# Patient Record
Sex: Female | Born: 1940
Health system: Southern US, Community
[De-identification: ages and names within clinical notes are randomized; demographics above are authoritative.]

## PROBLEM LIST (undated history)

## (undated) DIAGNOSIS — G709 Myoneural disorder, unspecified: Secondary | ICD-10-CM

## (undated) DIAGNOSIS — E785 Hyperlipidemia, unspecified: Secondary | ICD-10-CM

## (undated) DIAGNOSIS — I499 Cardiac arrhythmia, unspecified: Secondary | ICD-10-CM

## (undated) DIAGNOSIS — I4891 Unspecified atrial fibrillation: Secondary | ICD-10-CM

## (undated) DIAGNOSIS — I10 Essential (primary) hypertension: Secondary | ICD-10-CM

## (undated) DIAGNOSIS — C801 Malignant (primary) neoplasm, unspecified: Secondary | ICD-10-CM

## (undated) HISTORY — DX: Hyperlipidemia, unspecified: E78.5

## (undated) HISTORY — DX: Malignant (primary) neoplasm, unspecified: C80.1

## (undated) HISTORY — PX: TONSILLECTOMY: SUR1361

## (undated) HISTORY — PX: EYE SURGERY: SHX253

---

## 1998-09-22 ENCOUNTER — Other Ambulatory Visit: Admission: RE | Admit: 1998-09-22 | Discharge: 1998-09-22 | Payer: Self-pay | Admitting: *Deleted

## 1998-11-14 ENCOUNTER — Ambulatory Visit (HOSPITAL_COMMUNITY): Admission: RE | Admit: 1998-11-14 | Discharge: 1998-11-14 | Payer: Self-pay | Admitting: Gastroenterology

## 1998-11-14 ENCOUNTER — Encounter: Payer: Self-pay | Admitting: Gastroenterology

## 1998-12-20 HISTORY — PX: COLONOSCOPY: SHX174

## 1999-09-12 ENCOUNTER — Encounter: Admission: RE | Admit: 1999-09-12 | Discharge: 1999-09-12 | Payer: Self-pay | Admitting: Obstetrics and Gynecology

## 1999-09-12 ENCOUNTER — Encounter: Payer: Self-pay | Admitting: Obstetrics and Gynecology

## 1999-09-12 ENCOUNTER — Other Ambulatory Visit: Admission: RE | Admit: 1999-09-12 | Discharge: 1999-09-12 | Payer: Self-pay | Admitting: Obstetrics and Gynecology

## 2000-09-13 ENCOUNTER — Other Ambulatory Visit: Admission: RE | Admit: 2000-09-13 | Discharge: 2000-09-13 | Payer: Self-pay | Admitting: Obstetrics and Gynecology

## 2000-09-25 ENCOUNTER — Encounter: Admission: RE | Admit: 2000-09-25 | Discharge: 2000-09-25 | Payer: Self-pay | Admitting: Obstetrics and Gynecology

## 2000-09-25 ENCOUNTER — Encounter: Payer: Self-pay | Admitting: Obstetrics and Gynecology

## 2001-09-16 ENCOUNTER — Other Ambulatory Visit: Admission: RE | Admit: 2001-09-16 | Discharge: 2001-09-16 | Payer: Self-pay | Admitting: Obstetrics and Gynecology

## 2001-10-01 ENCOUNTER — Encounter: Admission: RE | Admit: 2001-10-01 | Discharge: 2001-10-01 | Payer: Self-pay | Admitting: Obstetrics and Gynecology

## 2001-10-01 ENCOUNTER — Encounter: Payer: Self-pay | Admitting: Obstetrics and Gynecology

## 2002-09-18 ENCOUNTER — Other Ambulatory Visit: Admission: RE | Admit: 2002-09-18 | Discharge: 2002-09-18 | Payer: Self-pay | Admitting: Obstetrics and Gynecology

## 2002-10-02 ENCOUNTER — Encounter: Admission: RE | Admit: 2002-10-02 | Discharge: 2002-10-02 | Payer: Self-pay | Admitting: Obstetrics and Gynecology

## 2002-10-02 ENCOUNTER — Encounter: Payer: Self-pay | Admitting: Obstetrics and Gynecology

## 2003-09-21 ENCOUNTER — Other Ambulatory Visit: Admission: RE | Admit: 2003-09-21 | Discharge: 2003-09-21 | Payer: Self-pay | Admitting: Obstetrics and Gynecology

## 2003-10-05 ENCOUNTER — Encounter: Admission: RE | Admit: 2003-10-05 | Discharge: 2003-10-05 | Payer: Self-pay | Admitting: Obstetrics and Gynecology

## 2004-10-05 ENCOUNTER — Encounter: Admission: RE | Admit: 2004-10-05 | Discharge: 2004-10-05 | Payer: Self-pay | Admitting: Obstetrics and Gynecology

## 2005-10-03 ENCOUNTER — Other Ambulatory Visit: Admission: RE | Admit: 2005-10-03 | Discharge: 2005-10-03 | Payer: Self-pay | Admitting: Obstetrics and Gynecology

## 2005-10-08 ENCOUNTER — Encounter: Admission: RE | Admit: 2005-10-08 | Discharge: 2005-10-08 | Payer: Self-pay | Admitting: Obstetrics and Gynecology

## 2006-10-09 ENCOUNTER — Encounter: Admission: RE | Admit: 2006-10-09 | Discharge: 2006-10-09 | Payer: Self-pay | Admitting: Obstetrics and Gynecology

## 2006-10-22 ENCOUNTER — Encounter: Admission: RE | Admit: 2006-10-22 | Discharge: 2006-10-22 | Payer: Self-pay | Admitting: Obstetrics and Gynecology

## 2007-10-14 ENCOUNTER — Other Ambulatory Visit: Admission: RE | Admit: 2007-10-14 | Discharge: 2007-10-14 | Payer: Self-pay | Admitting: Obstetrics and Gynecology

## 2007-10-24 ENCOUNTER — Encounter: Admission: RE | Admit: 2007-10-24 | Discharge: 2007-10-24 | Payer: Self-pay | Admitting: Obstetrics and Gynecology

## 2008-10-26 ENCOUNTER — Other Ambulatory Visit: Admission: RE | Admit: 2008-10-26 | Discharge: 2008-10-26 | Payer: Self-pay | Admitting: Obstetrics and Gynecology

## 2008-10-26 ENCOUNTER — Encounter: Admission: RE | Admit: 2008-10-26 | Discharge: 2008-10-26 | Payer: Self-pay | Admitting: Obstetrics and Gynecology

## 2009-10-31 ENCOUNTER — Encounter: Admission: RE | Admit: 2009-10-31 | Discharge: 2009-10-31 | Payer: Self-pay | Admitting: Obstetrics and Gynecology

## 2010-03-13 ENCOUNTER — Encounter (INDEPENDENT_AMBULATORY_CARE_PROVIDER_SITE_OTHER): Payer: Self-pay | Admitting: *Deleted

## 2010-04-18 ENCOUNTER — Encounter (INDEPENDENT_AMBULATORY_CARE_PROVIDER_SITE_OTHER): Payer: Self-pay | Admitting: *Deleted

## 2010-04-21 ENCOUNTER — Ambulatory Visit: Payer: Self-pay | Admitting: Gastroenterology

## 2010-04-26 ENCOUNTER — Ambulatory Visit: Payer: Self-pay | Admitting: Gastroenterology

## 2010-11-07 ENCOUNTER — Encounter
Admission: RE | Admit: 2010-11-07 | Discharge: 2010-11-07 | Payer: Self-pay | Source: Home / Self Care | Attending: Family Medicine | Admitting: Family Medicine

## 2010-12-10 ENCOUNTER — Encounter: Payer: Self-pay | Admitting: Obstetrics and Gynecology

## 2010-12-19 NOTE — Miscellaneous (Signed)
Summary: previsit/recall/rm  Clinical Lists Changes  Medications: Added new medication of MOVIPREP 100 GM  SOLR (PEG-KCL-NACL-NASULF-NA ASC-C) As per prep instructions. - Signed Rx of MOVIPREP 100 GM  SOLR (PEG-KCL-NACL-NASULF-NA ASC-C) As per prep instructions.;  #1 x 0;  Signed;  Entered by: Sherren Kerns RN;  Authorized by: Louis Meckel MD;  Method used: Electronically to [facility]. #[id number]*, [address], [place], [place], [place]  [address], Ph: [phone], Fax: [phone] Allergies: Added new allergy or adverse reaction of CODEINE Added new allergy or adverse reaction of * BANDAID Observations: Added new observation of NKA: F (04/21/2010 8:20)    Prescriptions: MOVIPREP 100 GM  SOLR (PEG-KCL-NACL-NASULF-NA ASC-C) As per prep instructions.  #1 x 0   Entered by:   Sherren Kerns RN   Authorized by:   Louis Meckel MD   Signed by:   Sherren Kerns RN on 04/21/2010   Method used:   Electronically to        [facility]. [phone]* (retail)       [address]       [place], [place]  [address]       Ph: [phone]       Fax: [phone]   RxID:   [id number]

## 2010-12-19 NOTE — Letter (Signed)
Summary: [facility] Instructions  [facility]  [address] [place], [place] [address]   Phone: [phone]  Fax: [phone]       BEAULAH ROMANEK    02/07/1941    MRN: [id number]        Procedure Day Dorna Bloom:  Wednesday 04/26/2010     Arrival Time: 7:30 am      Procedure Time: 8:00 am     Location of Procedure:                    _x _  [facility] (4th Floor)                        PREPARATION FOR COLONOSCOPY WITH MOVIPREP   Starting 5 days prior to your procedure Friday 6/3 do not eat nuts, seeds, popcorn, corn, beans, peas,  salads, or any raw vegetables.  Do not take any fiber supplements (e.g. Metamucil, Citrucel, and Benefiber).  THE DAY BEFORE YOUR PROCEDURE         DATE: Tuesday 6/7  1.  Drink clear liquids the entire day-NO SOLID FOOD  2.  Do not drink anything colored red or purple.  Avoid juices with pulp.  No orange juice.  3.  Drink at least 64 oz. (8 glasses) of fluid/clear liquids during the day to prevent dehydration and help the prep work efficiently.  CLEAR LIQUIDS INCLUDE: Water Jello Ice Popsicles Tea (sugar ok, no milk/cream) Powdered fruit flavored drinks Coffee (sugar ok, no milk/cream) Gatorade Juice: apple, white grape, white cranberry  Lemonade Clear bullion, consomm, broth Carbonated beverages (any kind) Strained chicken noodle soup Hard Candy                             4.  In the morning, mix first dose of MoviPrep solution:    Empty 1 Pouch A and 1 Pouch B into the disposable container    Add lukewarm drinking water to the top line of the container. Mix to dissolve    Refrigerate (mixed solution should be used within 24 hrs)  5.  Begin drinking the prep at 5:00 p.m. The MoviPrep container is divided by 4 marks.   Every 15 minutes drink the solution down to the next mark (approximately 8 oz) until the full liter is complete.   6.  Follow completed prep with 16 oz of clear liquid of your choice  (Nothing red or purple).  Continue to drink clear liquids until bedtime.  7.  Before going to bed, mix second dose of MoviPrep solution:    Empty 1 Pouch A and 1 Pouch B into the disposable container    Add lukewarm drinking water to the top line of the container. Mix to dissolve    Refrigerate  THE DAY OF YOUR PROCEDURE      DATE: Wednesday 6/8  Beginning at 3:00 a.m. (5 hours before procedure):         1. Every 15 minutes, drink the solution down to the next mark (approx 8 oz) until the full liter is complete.  2. Follow completed prep with 16 oz. of clear liquid of your choice.    3. You may drink clear liquids until 6:00 am (2 HOURS BEFORE PROCEDURE).   MEDICATION INSTRUCTIONS  Unless otherwise instructed, you should take regular prescription medications with a small sip of water   as early as possible the morning of  your procedure.   Additional medication instructions:  n/a         OTHER INSTRUCTIONS  You will need a responsible adult at least 69 years of age to accompany you and drive you home.   This person must remain in the waiting room during your procedure.  Wear loose fitting clothing that is easily removed.  Leave jewelry and other valuables at home.  However, you may wish to bring a book to read or  an iPod/MP3 player to listen to music as you wait for your procedure to start.  Remove all body piercing jewelry and leave at home.  Total time from sign-in until discharge is approximately 2-3 hours.  You should go home directly after your procedure and rest.  You can resume normal activities the  day after your procedure.  The day of your procedure you should not:   Drive   Make legal decisions   Operate machinery   Drink alcohol   Return to work  You will receive specific instructions about eating, activities and medications before you leave.    The above instructions have been reviewed and explained to me by   Sherren Kerns RN  April 21, 2010 8:48 AM     I fully understand and can verbalize these instructions _____________________________ Date _________

## 2010-12-19 NOTE — Letter (Signed)
Summary: Previsit letter  [facility]  [address] [place], [place] [address]   Phone: [phone]  Fax: [phone]       03/13/2010 MRN: [id number]  Kathy Robinson [address] [place], [place]  [address]  Dear Kathy Robinson,  Welcome to the Gastroenterology Division at [facility].    You are scheduled to see a nurse for your pre-procedure visit on 04/21/2010 at 8:30am on the 3rd floor at [facility], [address]  We ask that you try to arrive at our office 15 minutes prior to your appointment time to allow for check-in.  Your nurse visit will consist of discussing your medical and surgical history, your immediate family medical history, and your medications.    Please bring a complete list of all your medications or, if you prefer, bring the medication bottles and we will list them.  We will need to be aware of both prescribed and over the counter drugs.  We will need to know exact dosage information as well.  If you are on blood thinners (Coumadin, Plavix, Aggrenox, Ticlid, etc.) please call our office today/prior to your appointment, as we need to consult with your physician about holding your medication.   Please be prepared to read and sign documents such as consent forms, a financial agreement, and acknowledgement forms.  If necessary, and with your consent, a friend or relative is welcome to sit-in on the nurse visit with you.  Please bring your insurance card so that we may make a copy of it.  If your insurance requires a referral to see a specialist, please bring your referral form from your primary care physician.  No co-pay is required for this nurse visit.     If you cannot keep your appointment, please call [phone] to cancel or reschedule prior to your appointment date.  This allows [place] the opportunity to schedule an appointment for another patient in need of care.    Thank you for choosing  Gastroenterology for your medical  needs.  We appreciate the opportunity to care for you.  Please visit [place] at our website  to learn more about our practice.                     Sincerely.                                                                                                                   The Gastroenterology Division

## 2010-12-19 NOTE — Procedures (Signed)
Summary: Colonoscopy  Patient: Glendia Olshefski Note: All result statuses are Final unless otherwise noted.  Tests: (1) Colonoscopy (COL)   COL Colonoscopy           DONE     [facility]     [address]     [place], [place]  [address]           COLONOSCOPY PROCEDURE REPORT           PATIENT:  Anadalay, Macdonell  MR#:  [id number]     BIRTHDATE:  18-Aug-1941, 68 yrs. old  GENDER:  female           ENDOSCOPIST:  Barbette Hair. Arlyce Dice, MD     Referred by:           PROCEDURE DATE:  04/26/2010     PROCEDURE:  Diagnostic Colonoscopy     ASA CLASS:  Class I     INDICATIONS:  1) Routine Risk Screening           MEDICATIONS:   Fentanyl 75 mcg IV, Versed 7 mg IV           DESCRIPTION OF PROCEDURE:   After the risks benefits and     alternatives of the procedure were thoroughly explained, informed     consent was obtained.  Digital rectal exam was performed and     revealed no abnormalities.   The LB CF-[id number] endoscope     was introduced through the anus and advanced to the cecum, which     was identified by the ileocecal valve, without limitations.  The     quality of the prep was excellent, using MoviPrep.  The instrument     was then slowly withdrawn as the colon was fully examined.     <<PROCEDUREIMAGES>>           FINDINGS:  Scattered diverticula were found ascending colon to     sigmoid colon (see image1).  This was otherwise a normal     examination of the colon (see image2, image3, image7, image10, and     image11).   Retroflexed views in the rectum revealed no     abnormalities.    The time to cecum =  3.0  minutes. The scope was     then withdrawn (time =  6.5  min) from the patient and the     procedure completed.           COMPLICATIONS:  None           ENDOSCOPIC IMPRESSION:     1) Diverticula, scattered ascending colon to sigmoid colon     2) Otherwise normal examination     RECOMMENDATIONS:     1) Continue current colorectal screening  recommendations for     "routine risk" patients with a repeat colonoscopy in 10 years.           REPEAT EXAM:  In 10 year(s) for Colonoscopy.           ______________________________     Barbette Hair. Arlyce Dice, MD           CC: Shaune Pollack, MD, Meredeth Ide, MD           n.     Rosalie Doctor:   Barbette Hair. Olamide Lahaie at 04/26/2010 08:43 AM           Revonda Standard, [id number]  Note: An exclamation mark (!) indicates a result that was not dispersed into the flowsheet. Document Creation  Date: 04/26/2010 8:44 AM _______________________________________________________________________  (1) Order result status: Final Collection or observation date-time: 04/26/2010 08:36 Requested date-time:  Receipt date-time:  Reported date-time:  Referring Physician:   Ordering Physician: Melvia Heaps [phone]) Specimen Source:  Source: Launa Grill Order Number: [id number] Lab site:   Appended Document: Colonoscopy    Clinical Lists Changes  Observations: Added new observation of COLONNXTDUE: 04/2020 (04/26/2010 11:17)

## 2011-07-19 ENCOUNTER — Other Ambulatory Visit: Payer: Self-pay | Admitting: Obstetrics and Gynecology

## 2011-07-19 DIAGNOSIS — Z1231 Encounter for screening mammogram for malignant neoplasm of breast: Secondary | ICD-10-CM

## 2011-11-21 ENCOUNTER — Ambulatory Visit
Admission: RE | Admit: 2011-11-21 | Discharge: 2011-11-21 | Disposition: A | Discharge status: Home / Self Care | Payer: Medicare Other | Source: Ambulatory Visit | Attending: Obstetrics and Gynecology | Admitting: Obstetrics and Gynecology

## 2011-11-21 DIAGNOSIS — Z1231 Encounter for screening mammogram for malignant neoplasm of breast: Secondary | ICD-10-CM

## 2012-12-22 ENCOUNTER — Other Ambulatory Visit: Payer: Self-pay | Admitting: Obstetrics and Gynecology

## 2012-12-22 DIAGNOSIS — Z1231 Encounter for screening mammogram for malignant neoplasm of breast: Secondary | ICD-10-CM

## 2013-01-20 ENCOUNTER — Ambulatory Visit
Admission: RE | Admit: 2013-01-20 | Discharge: 2013-01-20 | Disposition: A | Discharge status: Home / Self Care | Payer: Medicare Other | Source: Ambulatory Visit | Attending: Obstetrics and Gynecology | Admitting: Obstetrics and Gynecology

## 2013-01-20 ENCOUNTER — Ambulatory Visit: Payer: Medicare Other

## 2013-01-20 ENCOUNTER — Other Ambulatory Visit: Payer: Self-pay | Admitting: Obstetrics and Gynecology

## 2013-01-20 DIAGNOSIS — Z1231 Encounter for screening mammogram for malignant neoplasm of breast: Secondary | ICD-10-CM

## 2013-02-10 ENCOUNTER — Encounter: Payer: Self-pay | Admitting: Obstetrics and Gynecology

## 2013-02-10 ENCOUNTER — Encounter: Payer: Self-pay | Admitting: *Deleted

## 2013-02-11 ENCOUNTER — Encounter: Payer: Self-pay | Admitting: Obstetrics and Gynecology

## 2013-02-11 ENCOUNTER — Ambulatory Visit (INDEPENDENT_AMBULATORY_CARE_PROVIDER_SITE_OTHER): Payer: 59 | Admitting: Obstetrics and Gynecology

## 2013-02-11 ENCOUNTER — Encounter: Payer: Self-pay | Admitting: *Deleted

## 2013-02-11 VITALS — BP 134/70 | Wt 142.0 lb

## 2013-02-11 DIAGNOSIS — M8580 Other specified disorders of bone density and structure, unspecified site: Secondary | ICD-10-CM

## 2013-02-11 DIAGNOSIS — M899 Disorder of bone, unspecified: Secondary | ICD-10-CM

## 2013-02-11 MED ORDER — RISEDRONATE SODIUM 35 MG PO TABS: 35.0000 mg | ORAL_TABLET | ORAL | Status: DC

## 2013-02-11 NOTE — Progress Notes (Signed)
72 yo SWF  G0P0 here to discuss BMD results.  Had Dexa 3.13.2014 showing a t score of -2.3 at the spine.  This was significantly decreased since her exam in Jan 2010.  She quit HRT in Sept 2009, just before the 2010 Dexa.  Now since off HRT, her BMD has declined 9.3 %.   Results discussed with patient and comparison made to previous studies.  Rec either aggressive therapy without meds with weight lifting and carkio exercises vs bisphosphonates. Pt already walks almost every day and does 115 reps of lifting 5 pound weights every day. Pt's mother had bad experience with fosamax, so pt prefers a different med.  We will try weekly actonel,  Pt instructed.

## 2013-02-11 NOTE — Patient Instructions (Addendum)
Bone Densitometry Bone densitometry is a special X-ray that measures your bone density and can be used to help predict your risk of bone fractures. This test is used to determine bone mineral content and density to diagnose osteoporosis. Osteoporosis is the loss of bone that may cause the bone to become weak. Osteoporosis commonly occurs in women entering menopause. However, it may be found in men and in people with other diseases. WHO SHOULD BE TESTED?  All women older than 38.  Postmenopausal women (50 to 66) with risk factors for osteoporosis.  People with a previous fracture caused by normal activities.  People with a small body frame (less than 127 poundsor a body mass index [BMI] of less than 21).  People who have a parent with a hip fracture or history of osteoporosis.  People who smoke.  People who have rheumatoid arthritis.  Anyone who engages in excessive alcohol use (more than 3 drinks most days).  Women who experience early menopause. WHEN SHOULD YOU BE RETESTED? Current guidelines suggest that you should wait at least 2 years before doing a bone density test again if your first test was normal.Recent studies indicated that women with normal bone density may be able to wait a few years before needing to repeat a bone density test. You should discuss this with your caregiver.  NORMAL FINDINGS   Normal: less than standard deviation below normal (greater than -1).  Osteopenia: 1 to 2.5 standard deviations below normal (-1 to -2.5).  Osteoporosis: greater than 2.5 standard deviations below normal (less than -2.5). Test results are reported as a "T score" and a "Z score."The T score is a number that compares your bone density with the bone density of healthy, young women.The Z score is a number that compares your bone density with the scores of women who are the same age, gender, and race.  Ranges for normal findings may vary among different laboratories and hospitals. You  should always check with your doctor after having lab work or other tests done to discuss the meaning of your test results and whether your values are considered within normal limits. MEANING OF TEST  Your caregiver will go over the test results with you and discuss the importance and meaning of your results, as well as treatment options and the need for additional tests if necessary. OBTAINING THE TEST RESULTS It is your responsibility to obtain your test results. Ask the lab or department performing the test when and how you will get your results. Document Released: 11/27/2004 Document Revised: 01/28/2012 Document Reviewed: 12/20/2010 [facility] Patient Information 2013 [place], [place]. The name of the every six month shot is PROLIA

## 2013-02-12 ENCOUNTER — Telehealth: Payer: Self-pay | Admitting: Obstetrics and Gynecology

## 2013-02-12 NOTE — Telephone Encounter (Signed)
Pt called her insurance company to see if they would cover Prolia (injection). Insurance company will cover the injection. Insurance company gave her a number for Dr. Tresa Res to call in order to get it cleared with the insurance company. The phone number is [phone].

## 2013-02-16 NOTE — Telephone Encounter (Signed)
Prolia Plus form faxed to [employer]. Fax confirmed. sue

## 2013-02-18 ENCOUNTER — Telehealth: Payer: Self-pay | Admitting: Obstetrics and Gynecology

## 2013-02-18 NOTE — Telephone Encounter (Signed)
Pt called pharmacy and ins co will cover Prolia. Pt needs rx for Prolia. Please call rx to [phone]. Pt states she called last week and lm but no prior messages came up/[place]

## 2013-02-18 NOTE — Telephone Encounter (Signed)
02/18/2013 pt was notified that her prior approval for prolia has been started and still waiting for approval Will notify pt. When approval has been obtained. Kathy Robinson in triage will call to check on this cm.

## 2013-02-18 NOTE — Telephone Encounter (Signed)
Fine to RX prolia 60 mg subq to bring to office for injection.  Refill once.

## 2013-02-19 NOTE — Telephone Encounter (Signed)
Prior Authorization faxed to [facility] for Prolia

## 2013-02-20 ENCOUNTER — Other Ambulatory Visit: Payer: Self-pay | Admitting: *Deleted

## 2013-02-20 ENCOUNTER — Telehealth: Payer: Self-pay | Admitting: *Deleted

## 2013-02-20 DIAGNOSIS — M810 Age-related osteoporosis without current pathological fracture: Secondary | ICD-10-CM | POA: Insufficient documentation

## 2013-02-20 MED ORDER — DENOSUMAB 60 MG/ML ~~LOC~~ SOLN: 60.0000 mg | SUBCUTANEOUS | Status: DC

## 2013-02-20 NOTE — Telephone Encounter (Signed)
Yes, fine to order labs.

## 2013-02-20 NOTE — Telephone Encounter (Signed)
prolia approved by [facility] for one year 02/2014. Phoned in to [facility]...sue

## 2013-02-20 NOTE — Telephone Encounter (Signed)
Patient notified of need to come to office for lab work of calcium and vit d blood work. After labs drawn and resulted will call in Prolia to her pharmacy [facility]. Kathy Robinson.. orders put in for labs.

## 2013-02-20 NOTE — Telephone Encounter (Signed)
Approval for Prolia received. OK to order calcuim and vitamin D level prior to injection or check with PCP if one has been done already ?  Please advise? Fannie Knee   No recent labs noted in chart.

## 2013-02-20 NOTE — Telephone Encounter (Signed)
Left message on home # of need to call office to schedule lab test prior to Prolia Injections.sue

## 2013-02-23 ENCOUNTER — Other Ambulatory Visit (INDEPENDENT_AMBULATORY_CARE_PROVIDER_SITE_OTHER): Payer: 59

## 2013-02-23 DIAGNOSIS — M899 Disorder of bone, unspecified: Secondary | ICD-10-CM

## 2013-02-23 DIAGNOSIS — M810 Age-related osteoporosis without current pathological fracture: Secondary | ICD-10-CM

## 2013-02-23 DIAGNOSIS — M949 Disorder of cartilage, unspecified: Secondary | ICD-10-CM

## 2013-02-23 LAB — CALCIUM: Calcium: 9.6 mg/dL (ref 8.4–10.5)

## 2013-02-24 ENCOUNTER — Telehealth: Payer: Self-pay | Admitting: *Deleted

## 2013-02-24 LAB — VITAMIN D 25 HYDROXY (VIT D DEFICIENCY, FRACTURES): Vit D, 25-Hydroxy: 61 ng/mL (ref 30–89)

## 2013-02-24 NOTE — Progress Notes (Signed)
LMTCB 02/24/13 cm

## 2013-02-24 NOTE — Telephone Encounter (Signed)
Spoke with patient about her Vitamin D Level and instructions  given to take 2000iu daily, pt has decided not to do Prolia and to Pick up Rx For Actonel at Pharmacy. Prolia was previously approved per Fannie Knee in Triage please advise.

## 2013-02-24 NOTE — Telephone Encounter (Signed)
That's fine.  She has rx for actonel already that I sent to her pharmacy.

## 2013-02-24 NOTE — Telephone Encounter (Signed)
Spoke with pt. Advised her per our conversation that Dr. Tresa Res was ok with her taking Actonel

## 2013-02-25 ENCOUNTER — Telehealth: Payer: Self-pay | Admitting: *Deleted

## 2013-02-25 NOTE — Telephone Encounter (Signed)
Patient informed Dr. Tresa Res that she does not want to do Prolia injection at this time. All forms for approval of Prolia for patient sent to [facility] to be scanned into chart. sue

## 2013-12-08 ENCOUNTER — Other Ambulatory Visit: Payer: Self-pay

## 2013-12-08 DIAGNOSIS — Z1231 Encounter for screening mammogram for malignant neoplasm of breast: Secondary | ICD-10-CM

## 2014-01-08 ENCOUNTER — Ambulatory Visit: Payer: Self-pay | Admitting: Gynecology

## 2014-01-08 ENCOUNTER — Ambulatory Visit: Payer: Self-pay | Admitting: Obstetrics and Gynecology

## 2014-01-14 ENCOUNTER — Ambulatory Visit: Payer: Self-pay | Admitting: Obstetrics & Gynecology

## 2014-01-25 ENCOUNTER — Ambulatory Visit
Admission: RE | Admit: 2014-01-25 | Discharge: 2014-01-25 | Disposition: A | Discharge status: Home / Self Care | Payer: Medicare Other | Source: Ambulatory Visit

## 2014-01-25 DIAGNOSIS — Z1231 Encounter for screening mammogram for malignant neoplasm of breast: Secondary | ICD-10-CM

## 2014-12-28 ENCOUNTER — Other Ambulatory Visit: Payer: Self-pay

## 2014-12-28 DIAGNOSIS — Z1231 Encounter for screening mammogram for malignant neoplasm of breast: Secondary | ICD-10-CM

## 2015-02-01 ENCOUNTER — Ambulatory Visit
Admission: RE | Admit: 2015-02-01 | Discharge: 2015-02-01 | Disposition: A | Discharge status: Home / Self Care | Payer: Medicare Other | Source: Ambulatory Visit

## 2015-02-01 DIAGNOSIS — Z1231 Encounter for screening mammogram for malignant neoplasm of breast: Secondary | ICD-10-CM

## 2016-01-09 ENCOUNTER — Other Ambulatory Visit: Payer: Self-pay | Admitting: Family Medicine

## 2016-01-09 DIAGNOSIS — Z1231 Encounter for screening mammogram for malignant neoplasm of breast: Secondary | ICD-10-CM

## 2016-03-07 ENCOUNTER — Ambulatory Visit
Admission: RE | Admit: 2016-03-07 | Discharge: 2016-03-07 | Disposition: A | Discharge status: Home / Self Care | Payer: Medicare Other | Source: Ambulatory Visit | Attending: Family Medicine | Admitting: Family Medicine

## 2016-03-07 DIAGNOSIS — Z1231 Encounter for screening mammogram for malignant neoplasm of breast: Secondary | ICD-10-CM

## 2017-01-16 ENCOUNTER — Other Ambulatory Visit: Payer: Self-pay | Admitting: Family Medicine

## 2017-01-16 DIAGNOSIS — Z1231 Encounter for screening mammogram for malignant neoplasm of breast: Secondary | ICD-10-CM

## 2017-03-08 ENCOUNTER — Ambulatory Visit
Admission: RE | Admit: 2017-03-08 | Discharge: 2017-03-08 | Disposition: A | Discharge status: Home / Self Care | Payer: Medicare Other | Source: Ambulatory Visit | Attending: Family Medicine | Admitting: Family Medicine

## 2017-03-08 DIAGNOSIS — Z1231 Encounter for screening mammogram for malignant neoplasm of breast: Secondary | ICD-10-CM

## 2017-12-24 ENCOUNTER — Other Ambulatory Visit: Payer: Self-pay | Admitting: Family Medicine

## 2017-12-24 DIAGNOSIS — Z139 Encounter for screening, unspecified: Secondary | ICD-10-CM

## 2018-03-10 ENCOUNTER — Ambulatory Visit
Admission: RE | Admit: 2018-03-10 | Discharge: 2018-03-10 | Disposition: A | Discharge status: Home / Self Care | Payer: Medicare Other | Source: Ambulatory Visit | Attending: Family Medicine | Admitting: Family Medicine

## 2018-03-10 DIAGNOSIS — Z139 Encounter for screening, unspecified: Secondary | ICD-10-CM

## 2019-01-20 ENCOUNTER — Other Ambulatory Visit: Payer: Self-pay | Admitting: Rheumatology

## 2019-01-20 DIAGNOSIS — Z1231 Encounter for screening mammogram for malignant neoplasm of breast: Secondary | ICD-10-CM

## 2019-03-12 ENCOUNTER — Ambulatory Visit: Payer: Medicare Other

## 2019-11-20 DIAGNOSIS — C189 Malignant neoplasm of colon, unspecified: Secondary | ICD-10-CM

## 2019-11-20 HISTORY — DX: Malignant neoplasm of colon, unspecified: C18.9

## 2019-12-10 ENCOUNTER — Ambulatory Visit: Payer: Medicare Other | Attending: Internal Medicine

## 2019-12-10 DIAGNOSIS — Z23 Encounter for immunization: Secondary | ICD-10-CM | POA: Insufficient documentation

## 2019-12-10 NOTE — Progress Notes (Signed)
   Covid-19 Vaccination Clinic  Name:  Kathy Robinson    MRN: [id number] DOB: 03/29/1941  12/10/2019  [name] was observed post Covid-19 immunization for 15 minutes without incidence. She was provided with Vaccine Information Sheet and instruction to access the V-Safe system.   [name] was instructed to call 911 with any severe reactions post vaccine: Marland Kitchen Difficulty breathing  . Swelling of your face and throat  . A fast heartbeat  . A bad rash all over your body  . Dizziness and weakness    Immunizations Administered    Name Date Dose VIS Date Route   Pfizer COVID-19 Vaccine 12/10/2019  8:31 AM 0.3 mL 10/30/2019 Intramuscular   Manufacturer: [place]   Lot: [id number]   [place]: [id number]

## 2019-12-30 ENCOUNTER — Ambulatory Visit: Payer: Medicare Other | Attending: Internal Medicine

## 2019-12-30 DIAGNOSIS — Z23 Encounter for immunization: Secondary | ICD-10-CM

## 2019-12-30 NOTE — Progress Notes (Signed)
   Covid-19 Vaccination Clinic  Name:  Kathy Robinson    MRN: [id number] DOB: Sep 08, 1941  12/30/2019  [name] was observed post Covid-19 immunization for 15 minutes without incidence. She was provided with Vaccine Information Sheet and instruction to access the V-Safe system.   [name] was instructed to call 911 with any severe reactions post vaccine: Marland Kitchen Difficulty breathing  . Swelling of your face and throat  . A fast heartbeat  . A bad rash all over your body  . Dizziness and weakness    Immunizations Administered    Name Date Dose VIS Date Route   Pfizer COVID-19 Vaccine 12/30/2019 10:47 AM 0.3 mL 10/30/2019 Intramuscular   Manufacturer: [place]   Lot: [id number]   [place]: [id number]

## 2020-01-07 ENCOUNTER — Ambulatory Visit: Payer: Medicare Other

## 2020-05-16 ENCOUNTER — Encounter: Payer: Self-pay | Admitting: Gastroenterology

## 2020-05-26 ENCOUNTER — Encounter (HOSPITAL_COMMUNITY): Payer: Self-pay

## 2020-05-26 ENCOUNTER — Inpatient Hospital Stay (HOSPITAL_COMMUNITY)
Admission: EM | Admit: 2020-05-26 | Discharge: 2020-06-03 | DRG: 330 | Disposition: A | Discharge status: Home / Self Care | Payer: Medicare Other | Attending: Internal Medicine | Admitting: Internal Medicine

## 2020-05-26 ENCOUNTER — Other Ambulatory Visit: Payer: Self-pay

## 2020-05-26 ENCOUNTER — Other Ambulatory Visit: Payer: Self-pay | Admitting: Physician Assistant

## 2020-05-26 ENCOUNTER — Ambulatory Visit
Admission: RE | Admit: 2020-05-26 | Discharge: 2020-05-26 | Disposition: A | Discharge status: Home / Self Care | Payer: Medicare Other | Source: Ambulatory Visit | Attending: Physician Assistant | Admitting: Physician Assistant

## 2020-05-26 DIAGNOSIS — R112 Nausea with vomiting, unspecified: Secondary | ICD-10-CM

## 2020-05-26 DIAGNOSIS — Z85828 Personal history of other malignant neoplasm of skin: Secondary | ICD-10-CM | POA: Diagnosis not present

## 2020-05-26 DIAGNOSIS — M810 Age-related osteoporosis without current pathological fracture: Secondary | ICD-10-CM | POA: Diagnosis present

## 2020-05-26 DIAGNOSIS — R7303 Prediabetes: Secondary | ICD-10-CM | POA: Diagnosis present

## 2020-05-26 DIAGNOSIS — I480 Paroxysmal atrial fibrillation: Secondary | ICD-10-CM | POA: Diagnosis present

## 2020-05-26 DIAGNOSIS — R6881 Early satiety: Secondary | ICD-10-CM | POA: Diagnosis not present

## 2020-05-26 DIAGNOSIS — Z79899 Other long term (current) drug therapy: Secondary | ICD-10-CM | POA: Diagnosis not present

## 2020-05-26 DIAGNOSIS — Z8249 Family history of ischemic heart disease and other diseases of the circulatory system: Secondary | ICD-10-CM | POA: Diagnosis not present

## 2020-05-26 DIAGNOSIS — D72829 Elevated white blood cell count, unspecified: Secondary | ICD-10-CM | POA: Diagnosis not present

## 2020-05-26 DIAGNOSIS — Z885 Allergy status to narcotic agent status: Secondary | ICD-10-CM

## 2020-05-26 DIAGNOSIS — Z01818 Encounter for other preprocedural examination: Secondary | ICD-10-CM

## 2020-05-26 DIAGNOSIS — K6389 Other specified diseases of intestine: Secondary | ICD-10-CM | POA: Diagnosis present

## 2020-05-26 DIAGNOSIS — Z8262 Family history of osteoporosis: Secondary | ICD-10-CM | POA: Diagnosis not present

## 2020-05-26 DIAGNOSIS — K56609 Unspecified intestinal obstruction, unspecified as to partial versus complete obstruction: Secondary | ICD-10-CM | POA: Diagnosis present

## 2020-05-26 DIAGNOSIS — Z91048 Other nonmedicinal substance allergy status: Secondary | ICD-10-CM

## 2020-05-26 DIAGNOSIS — C772 Secondary and unspecified malignant neoplasm of intra-abdominal lymph nodes: Secondary | ICD-10-CM | POA: Diagnosis present

## 2020-05-26 DIAGNOSIS — R109 Unspecified abdominal pain: Secondary | ICD-10-CM

## 2020-05-26 DIAGNOSIS — E876 Hypokalemia: Secondary | ICD-10-CM | POA: Diagnosis present

## 2020-05-26 DIAGNOSIS — C189 Malignant neoplasm of colon, unspecified: Principal | ICD-10-CM | POA: Diagnosis present

## 2020-05-26 DIAGNOSIS — Z01810 Encounter for preprocedural cardiovascular examination: Secondary | ICD-10-CM | POA: Diagnosis not present

## 2020-05-26 DIAGNOSIS — I4891 Unspecified atrial fibrillation: Secondary | ICD-10-CM | POA: Diagnosis not present

## 2020-05-26 DIAGNOSIS — Z20822 Contact with and (suspected) exposure to covid-19: Secondary | ICD-10-CM | POA: Diagnosis not present

## 2020-05-26 DIAGNOSIS — R590 Localized enlarged lymph nodes: Secondary | ICD-10-CM | POA: Diagnosis present

## 2020-05-26 DIAGNOSIS — Z9109 Other allergy status, other than to drugs and biological substances: Secondary | ICD-10-CM

## 2020-05-26 DIAGNOSIS — E785 Hyperlipidemia, unspecified: Secondary | ICD-10-CM | POA: Diagnosis present

## 2020-05-26 DIAGNOSIS — I700 Atherosclerosis of aorta: Secondary | ICD-10-CM | POA: Diagnosis present

## 2020-05-26 DIAGNOSIS — K5669 Other partial intestinal obstruction: Secondary | ICD-10-CM | POA: Diagnosis present

## 2020-05-26 DIAGNOSIS — I248 Other forms of acute ischemic heart disease: Secondary | ICD-10-CM | POA: Diagnosis present

## 2020-05-26 DIAGNOSIS — Z800 Family history of malignant neoplasm of digestive organs: Secondary | ICD-10-CM | POA: Diagnosis not present

## 2020-05-26 DIAGNOSIS — R7989 Other specified abnormal findings of blood chemistry: Secondary | ICD-10-CM | POA: Diagnosis not present

## 2020-05-26 DIAGNOSIS — I361 Nonrheumatic tricuspid (valve) insufficiency: Secondary | ICD-10-CM | POA: Diagnosis not present

## 2020-05-26 LAB — CBC
HCT: 43.9 % (ref 36.0–46.0)
Hemoglobin: 14.7 g/dL (ref 12.0–15.0)
MCH: 32 pg (ref 26.0–34.0)
MCHC: 33.5 g/dL (ref 30.0–36.0)
MCV: 95.4 fL (ref 80.0–100.0)
Platelets: 428 10*3/uL — ABNORMAL HIGH (ref 150–400)
RBC: 4.6 MIL/uL (ref 3.87–5.11)
RDW: 12.4 % (ref 11.5–15.5)
WBC: 12.7 10*3/uL — ABNORMAL HIGH (ref 4.0–10.5)
nRBC: 0 % (ref 0.0–0.2)

## 2020-05-26 LAB — COMPREHENSIVE METABOLIC PANEL
ALT: 16 U/L (ref 0–44)
AST: 22 U/L (ref 15–41)
Albumin: 4.2 g/dL (ref 3.5–5.0)
Alkaline Phosphatase: 60 U/L (ref 38–126)
Anion gap: 14 (ref 5–15)
BUN: 19 mg/dL (ref 8–23)
CO2: 28 mmol/L (ref 22–32)
Calcium: 9.9 mg/dL (ref 8.9–10.3)
Chloride: 97 mmol/L — ABNORMAL LOW (ref 98–111)
Creatinine, Ser: 0.8 mg/dL (ref 0.44–1.00)
GFR calc Af Amer: >60 mL/min (ref >60)
GFR calc non Af Amer: >60 mL/min (ref >60)
Glucose, Bld: 128 mg/dL — ABNORMAL HIGH (ref 70–99)
Potassium: 3.9 mmol/L (ref 3.5–5.1)
Sodium: 139 mmol/L (ref 135–145)
Total Bilirubin: 0.7 mg/dL (ref 0.3–1.2)
Total Protein: 8.5 g/dL — ABNORMAL HIGH (ref 6.5–8.1)

## 2020-05-26 LAB — LIPASE, BLOOD: Lipase: 18 U/L (ref 11–51)

## 2020-05-26 LAB — SARS CORONAVIRUS 2 BY RT PCR (HOSPITAL ORDER, PERFORMED IN ~~LOC~~ HOSPITAL LAB): SARS Coronavirus 2: NEGATIVE

## 2020-05-26 MED ORDER — SODIUM CHLORIDE 0.9 % IV SOLN
2.0000 g | Freq: Two times a day (BID) | INTRAVENOUS | Status: DC
  Administered 2020-05-27 – 2020-05-30 (×7): 2 g via INTRAVENOUS
  Filled 2020-05-26 (×8): qty 2

## 2020-05-26 MED ORDER — ONDANSETRON HCL 4 MG/2ML IJ SOLN: 4.0000 mg | Freq: Four times a day (QID) | INTRAMUSCULAR | Status: DC | PRN

## 2020-05-26 MED ORDER — KCL IN DEXTROSE-NACL 20-5-0.45 MEQ/L-%-% IV SOLN
INTRAVENOUS | Status: DC
  Filled 2020-05-26 (×3): qty 1000

## 2020-05-26 MED ORDER — HYDROMORPHONE HCL 1 MG/ML IJ SOLN: 1.0000 mg | INTRAMUSCULAR | Status: DC | PRN

## 2020-05-26 MED ORDER — SODIUM CHLORIDE 0.9 % IV BOLUS
1000.0000 mL | Freq: Once | INTRAVENOUS | Status: AC
  Administered 2020-05-26: 1000 mL via INTRAVENOUS

## 2020-05-26 MED ORDER — IOPAMIDOL (ISOVUE-300) INJECTION 61%
100.0000 mL | Freq: Once | INTRAVENOUS | Status: AC | PRN
  Administered 2020-05-26: 100 mL via INTRAVENOUS

## 2020-05-26 MED ORDER — ACETAMINOPHEN 325 MG PO TABS: 650.0000 mg | ORAL_TABLET | Freq: Four times a day (QID) | ORAL | Status: DC | PRN

## 2020-05-26 MED ORDER — ACETAMINOPHEN 650 MG RE SUPP: 650.0000 mg | Freq: Four times a day (QID) | RECTAL | Status: DC | PRN

## 2020-05-26 MED ORDER — ONDANSETRON 4 MG PO TBDP: 4.0000 mg | ORAL_TABLET | Freq: Four times a day (QID) | ORAL | Status: DC | PRN

## 2020-05-26 MED ORDER — ENOXAPARIN SODIUM 30 MG/0.3ML ~~LOC~~ SOLN
30.0000 mg | SUBCUTANEOUS | Status: DC
  Administered 2020-05-27 – 2020-05-28 (×2): 30 mg via SUBCUTANEOUS
  Filled 2020-05-26 (×2): qty 0.3

## 2020-05-26 MED ORDER — OXYCODONE HCL 5 MG PO TABS
5.0000 mg | ORAL_TABLET | ORAL | Status: DC | PRN
  Administered 2020-05-30: 10 mg via ORAL
  Administered 2020-05-30 – 2020-05-31 (×2): 5 mg via ORAL
  Administered 2020-05-31: 10 mg via ORAL
  Filled 2020-05-26: qty 1
  Filled 2020-05-26 (×2): qty 2
  Filled 2020-05-26: qty 1

## 2020-05-26 MED ORDER — SODIUM CHLORIDE 0.9% FLUSH: 3.0000 mL | Freq: Once | INTRAVENOUS | Status: DC

## 2020-05-26 MED ORDER — METOCLOPRAMIDE HCL 5 MG/ML IJ SOLN
5.0000 mg | Freq: Once | INTRAMUSCULAR | Status: AC
  Administered 2020-05-26: 5 mg via INTRAVENOUS
  Filled 2020-05-26: qty 2

## 2020-05-26 MED ORDER — SODIUM CHLORIDE 0.9 % IV SOLN: INTRAVENOUS | Status: DC

## 2020-05-26 NOTE — ED Triage Notes (Signed)
Patient c/o emesis. Patient states in the past month she has had intermittent nausea and abdominal pain.  Patient states she began vomiting 2-3 days ago. Patient had a CT scan abdomen today.

## 2020-05-26 NOTE — ED Provider Notes (Signed)
[facility] DEPT Provider Note   CSN: [id number] Arrival date & time: 05/26/20  1609     History Chief Complaint  Patient presents with  . Emesis  . Abdominal Pain    Kathy Robinson is a 79 y.o. female.  79 year old female presents with several month history abdominal discomfort which has been waxing and waning.  States that she had emesis x2 today but her abdominal pain is actually improved.  No fever or chills.  Had a bowel movement 2 days ago which was normal.  Called her doctor and had an outpatient abdominal CT scan performed.  Was sent here for further management        Past Medical History:  Diagnosis Date  . Cancer ([place])  most recent 01/16/2013   h/o basal cell, squamous cell ca rt upper lip  . Hyperlipemia     Patient Active Problem List   Diagnosis Date Noted  . Senile osteoporosis 02/20/2013    Past Surgical History:  Procedure Laterality Date  . COLONOSCOPY  02/00   Dr. Deatra Ina     OB History   No obstetric history on file.     Family History  Problem Relation Age of Onset  . Osteoporosis Mother   . Cancer Mother   . Cancer Maternal Grandmother        colon  . AAA (abdominal aortic aneurysm) Father     Social History   Tobacco Use  . Smoking status: Never Smoker  . Smokeless tobacco: Never Used  Vaping Use  . Vaping Use: Never used  Substance Use Topics  . Alcohol use: Yes    Alcohol/week: 3.0 standard drinks    Types: 3 Standard drinks or equivalent per week    Comment: 3 glasses of wine a wk  . Drug use: No    Home Medications Prior to Admission medications   Medication Sig Start Date End Date Taking? Authorizing Provider  Beta Sitosterol 40 % POWD by Does not apply route.    [provider]  CALCIUM PO Take by mouth.    [provider]  denosumab (PROLIA) 60 MG/ML SOLN injection Inject 60 mg into the skin every 6 (six) months. Administer in upper arm, thigh, or abdomen 02/20/13    Romine, Lubertha South, MD  fish oil-omega-3 fatty acids 1000 MG capsule Take 2 g by mouth daily.    [provider]  fluorouracil (EFUDEX) 5 % cream  11/21/12   [provider]  Multiple Vitamins-Minerals (MULTI COMPLETE PO) Take by mouth.    [provider]  multivitamin-lutein (OCUVITE-LUTEIN) CAPS Take 1 capsule by mouth daily.    [provider]  Red Yeast Rice Extract (RED YEAST RICE PO) Take by mouth.    [provider]  risedronate (ACTONEL) 35 MG tablet Take 1 tablet (35 mg total) by mouth every 7 (seven) days. with water on empty stomach, nothing by mouth or lie down for next 30 minutes. 02/11/13   Romine, Lubertha South, MD  Vitamin D, Ergocalciferol, (DRISDOL) 50000 UNITS CAPS Take 50,000 Units by mouth every 30 (thirty) days.     [provider]    Allergies    Codeine and Neosporin [neomycin-bacitracin zn-polymyx]  Review of Systems   Review of Systems  All other systems reviewed and are negative.   Physical Exam Updated Vital Signs BP (!) 152/85 (BP Location: Left Arm)   Pulse 87   Temp 98.3 F (36.8 C) (Oral)   Resp 16  Ht 1.676 m (5\' 6" )   Wt 63.5 kg   LMP 11/19/1996   SpO2 99%   BMI 22.60 kg/m   Physical Exam Vitals and nursing note reviewed.  Constitutional:      General: She is not in acute distress.    Appearance: Normal appearance. She is well-developed. She is not toxic-appearing.  HENT:     Head: Normocephalic and atraumatic.  Eyes:     General: Lids are normal.     Conjunctiva/sclera: Conjunctivae normal.     Pupils: Pupils are equal, round, and reactive to light.  Neck:     Thyroid: No thyroid mass.     Trachea: No tracheal deviation.  Cardiovascular:     Rate and Rhythm: Normal rate and regular rhythm.     Heart sounds: Normal heart sounds. No murmur heard.  No gallop.   Pulmonary:     Effort: Pulmonary effort is normal. No respiratory distress.     Breath sounds: Normal breath sounds. No stridor.  No decreased breath sounds, wheezing, rhonchi or rales.  Abdominal:     General: Bowel sounds are decreased. There is distension.     Palpations: Abdomen is soft.     Tenderness: There is no abdominal tenderness. There is no rebound.  Musculoskeletal:        General: No tenderness. Normal range of motion.     Cervical back: Normal range of motion and neck supple.  Skin:    General: Skin is warm and dry.     Findings: No abrasion or rash.  Neurological:     Mental Status: She is alert and oriented to person, place, and time.     GCS: GCS eye subscore is 4. GCS verbal subscore is 5. GCS motor subscore is 6.     Cranial Nerves: No cranial nerve deficit.     Sensory: No sensory deficit.  Psychiatric:        Speech: Speech normal.        Behavior: Behavior normal.     ED Results / Procedures / Treatments   Labs (all labs ordered are listed, but only abnormal results are displayed) Labs Reviewed  COMPREHENSIVE METABOLIC PANEL - Abnormal; Notable for the following components:      Result Value   Chloride 97 (*)    Glucose, Bld 128 (*)    Total Protein 8.5 (*)    All other components within normal limits  CBC - Abnormal; Notable for the following components:   WBC 12.7 (*)    Platelets 428 (*)    All other components within normal limits  LIPASE, BLOOD  URINALYSIS, ROUTINE W REFLEX MICROSCOPIC    EKG None  Radiology No results found.  Procedures Procedures (including critical care time)  Medications Ordered in ED Medications  sodium chloride flush (NS) 0.9 % injection 3 mL (3 mLs Intravenous Not Given 05/26/20 2027)  sodium chloride 0.9 % bolus 1,000 mL (has no administration in time range)  0.9 %  sodium chloride infusion (has no administration in time range)  metoCLOPramide (REGLAN) injection 5 mg (has no administration in time range)    ED Course  I have reviewed the triage vital signs and the nursing notes.  Pertinent labs & imaging results that were available  during my care of the patient were reviewed by me and considered in my medical decision making (see chart for details).    MDM Rules/Calculators/A&P  Patient's abdominal CT read by radiology and shows evidence of large bowel obstruction from a tumor in her colon.  Discussed with Dr. Toy Care from general surgery who will admit the patient Final Clinical Impression(s) / ED Diagnoses Final diagnoses:  None    Rx / DC Orders ED Discharge Orders    None       Lacretia Leigh, MD 05/26/20 2142

## 2020-05-26 NOTE — H&P (Signed)
Kathy Robinson is an 79 y.o. female.    General Surgery [facility]  Chief Complaint: colonic obstruction, nausea, emesis  HPI: Patient is a 79 year old female who presents to the emergency department on instruction from her primary care physician for signs and symptoms of colonic obstruction.  Patient had initially developed symptoms in March 2021.  Symptoms waxed and waned.  She changed her diet with some improvement.  She denies any bleeding per rectum.  Her last colonoscopy was 10 or 11 years ago by Dr. Erskine Emery.  Patient saw her primary care physician, Dr. Milagros Evener, on May 08, 2020.  She was referred to gastroenterology for evaluation.  In the interim, the patient has developed progressive abdominal distention.  She developed nausea and vomiting.  She was sent today for a CT scan of the abdomen and pelvis at [facility].  Patient was then told to come to the emergency department at [facility].  CT scan shows distal colonic obstruction with a long segment of narrowing in the distal descending colon and sigmoid colon.  There is a suggestion of possible perforation, possibly representing perforated malignancy of the colon.  General surgery is consulted for evaluation and management.  Patient has had no prior abdominal surgery.  She is on no prescription medicines.  She has been treated in the past for mild osteoporosis.  She is accompanied by her sister.  She is a retired Education officer, [employer].  Past Medical History:  Diagnosis Date  . Cancer ([place])  most recent 01/16/2013   h/o basal cell, squamous cell ca rt upper lip  . Hyperlipemia     Past Surgical History:  Procedure Laterality Date  . COLONOSCOPY  02/00   Dr. Deatra Ina    Family History  Problem Relation Age of Onset  . Osteoporosis Mother   . Cancer Mother   . Cancer Maternal Grandmother        colon  . AAA (abdominal aortic aneurysm) Father    Social History:  reports that she  has never smoked. She has never used smokeless tobacco. She reports current alcohol use of about 3.0 standard drinks of alcohol per week. She reports that she does not use drugs.  Allergies:  Allergies  Allergen Reactions  . Codeine     REACTION: nausea  . Neosporin [Neomycin-Bacitracin Zn-Polymyx] Swelling  . Tape     Can use paper tape    (Not in a hospital admission)   Results for orders placed or performed during the hospital encounter of 05/26/20 (from the past 48 hour(s))  Lipase, blood     Status: None   Collection Time: 05/26/20  5:45 PM  Result Value Ref Range   Lipase 18 11 - 51 U/L    Comment: Performed at [facility], [place] [address], [place], [place] [address]  Comprehensive metabolic panel     Status: Abnormal   Collection Time: 05/26/20  5:45 PM  Result Value Ref Range   Sodium 139 135 - 145 mmol/L   Potassium 3.9 3.5 - 5.1 mmol/L   Chloride 97 (L) 98 - 111 mmol/L   CO2 28 22 - 32 mmol/L   Glucose, Bld 128 (H) 70 - 99 mg/dL    Comment: Glucose reference range applies only to samples taken after fasting for at least 8 hours.   BUN 19 8 - 23 mg/dL   Creatinine, Ser 0.80 0.44 - 1.00 mg/dL   Calcium 9.9 8.9 - 10.3 mg/dL  Total Protein 8.5 (H) 6.5 - 8.1 g/dL   Albumin 4.2 3.5 - 5.0 g/dL   AST 22 15 - 41 U/L   ALT 16 0 - 44 U/L   Alkaline Phosphatase 60 38 - 126 U/L   Total Bilirubin 0.7 0.3 - 1.2 mg/dL   GFR calc non Af Amer >60 >60 mL/min   GFR calc Af Amer >60 >60 mL/min   Anion gap 14 5 - 15    Comment: Performed at [facility], [place] [address], [place], [place] [address]  CBC     Status: Abnormal   Collection Time: 05/26/20  5:45 PM  Result Value Ref Range   WBC 12.7 (H) 4.0 - 10.5 K/uL   RBC 4.60 3.87 - 5.11 MIL/uL   Hemoglobin 14.7 12.0 - 15.0 g/dL   HCT 43.9 36 - 46 %   MCV 95.4 80.0 - 100.0 fL   MCH 32.0 26.0 - 34.0 pg   MCHC 33.5 30.0 - 36.0 g/dL   RDW 12.4 11.5 - 15.5 %   Platelets 428 (H) 150 - 400 K/uL    nRBC 0.0 0.0 - 0.2 %    Comment: Performed at [facility], [place] [address] [place], [place] [address]  SARS Coronavirus 2 by RT PCR (hospital order, performed in [facility] hospital lab) Nasopharyngeal Nasopharyngeal Swab     Status: None   Collection Time: 05/26/20  9:01 PM   Specimen: Nasopharyngeal Swab  Result Value Ref Range   SARS Coronavirus 2 NEGATIVE NEGATIVE    Comment: (NOTE) SARS-CoV-2 target nucleic acids are NOT DETECTED.  The SARS-CoV-2 RNA is generally detectable in upper and lower respiratory specimens during the acute phase of infection. The lowest concentration of SARS-CoV-2 viral copies this assay can detect is 250 copies / mL. A negative result does not preclude SARS-CoV-2 infection and should not be used as the sole basis for treatment or other patient management decisions.  A negative result may occur with improper specimen collection / handling, submission of specimen other than nasopharyngeal swab, presence of viral mutation(s) within the areas targeted by this assay, and inadequate number of viral copies (<250 copies / mL). A negative result must be combined with clinical observations, patient history, and epidemiological information.  Fact Sheet for Patients:   StrictlyIdeas.no  Fact Sheet for Healthcare Providers: BankingDealers.co.za  This test is not yet approved or  cleared by the [place] FDA and has been authorized for detection and/or diagnosis of SARS-CoV-2 by FDA under an Emergency Use Authorization (EUA).  This EUA will remain in effect (meaning this test can be used) for the duration of the COVID-19 declaration under Section 564(b)(1) of the Act, 21 U.S.C. section 360bbb-3(b)(1), unless the authorization is terminated or revoked sooner.  Performed at [facility], [place] [address], [place], [place] [address]    No results found.  Review of Systems   Constitutional: Positive for appetite change.  HENT: Negative.   Eyes: Negative.   Respiratory: Negative.   Cardiovascular: Negative.   Gastrointestinal: Positive for abdominal distention, abdominal pain, constipation, nausea and vomiting.  Endocrine: Negative.   Genitourinary: Negative.   Musculoskeletal: Negative.   Skin: Negative.   Allergic/Immunologic: Negative.   Neurological: Negative.   Hematological: Negative.   Psychiatric/Behavioral: Negative.     Physical Exam   -----------------  Blood pressure 136/83, pulse 81, temperature 98.3 F (36.8 C), temperature source Oral, resp. rate 16, height 5\' 6"  (1.676 m), weight 63.5 kg, last  menstrual period 11/19/1996, SpO2 98 %.  CONSTITUTIONAL: no acute distress; conversant; no obvious deformities EYES: conjunctiva moist; no lid lag; anicteric; pupils equal bilaterally NECK: trachea midline; no thyroid nodularity LUNGS: respiratory effort normal & unlabored; no wheeze; no rales; no tactile fremitus CV: rate and rhythm regular; no palpable thrills; no murmur; no edema bilat lower extremities GI: abdomen is moderately distended and tympanitic; no palpable mass; no hepatosplenomegaly; no obvious hernia MSK: normal range of motion of extremities; no clubbing; no cyanosis PSYCHIATRIC: appropriate affect for situation; alert and oriented to person, place, & time LYMPHATIC: no palpable cervical lymphadenopathy; no evidence lymphedema in extremities  -----------------    Assessment/Plan Distal colonic obstruction, likely due to malignancy, with possible contained perforatioin  Admit to surgical service  NPO, ice chips, IV hydration  Empiric abx's for possible perforation  Patient will be admitted to the surgical service.  We will consult gastroenterology in the morning for possible flexible sigmoidoscopy and biopsy.  Will send CEA level.  Patient most likely has a distal colonic obstruction due to malignancy.  I discussed  this with the patient and her sister.  We discussed possible options for management including resection with colostomy, diverting colostomy, possible stent placement.  Patient and her sister understand and agree with admission and further evaluation.  Armandina Gemma, MD [facility] Office: [place], [place] 05/26/2020, 11:43 PM

## 2020-05-27 ENCOUNTER — Inpatient Hospital Stay (HOSPITAL_COMMUNITY): Payer: Medicare Other

## 2020-05-27 DIAGNOSIS — E785 Hyperlipidemia, unspecified: Secondary | ICD-10-CM

## 2020-05-27 DIAGNOSIS — K56609 Unspecified intestinal obstruction, unspecified as to partial versus complete obstruction: Secondary | ICD-10-CM

## 2020-05-27 DIAGNOSIS — R7989 Other specified abnormal findings of blood chemistry: Secondary | ICD-10-CM

## 2020-05-27 DIAGNOSIS — I361 Nonrheumatic tricuspid (valve) insufficiency: Secondary | ICD-10-CM

## 2020-05-27 DIAGNOSIS — K6389 Other specified diseases of intestine: Secondary | ICD-10-CM | POA: Diagnosis present

## 2020-05-27 DIAGNOSIS — Z01810 Encounter for preprocedural cardiovascular examination: Secondary | ICD-10-CM

## 2020-05-27 DIAGNOSIS — I4891 Unspecified atrial fibrillation: Secondary | ICD-10-CM

## 2020-05-27 LAB — URINALYSIS, ROUTINE W REFLEX MICROSCOPIC
Bilirubin Urine: NEGATIVE
Glucose, UA: NEGATIVE mg/dL
Ketones, ur: 80 mg/dL — AB
Leukocytes,Ua: NEGATIVE
Nitrite: NEGATIVE
Protein, ur: 30 mg/dL — AB
RBC / HPF: >50 RBC/hpf — ABNORMAL HIGH (ref 0–5)
Specific Gravity, Urine: 1.025 (ref 1.005–1.030)
pH: 5 (ref 5.0–8.0)

## 2020-05-27 LAB — ECHOCARDIOGRAM COMPLETE
Height: 66 in
Weight: 2303.37 oz

## 2020-05-27 LAB — LIPID PANEL
Cholesterol: 144 mg/dL (ref 0–200)
HDL: 48 mg/dL (ref >40)
LDL Cholesterol: 86 mg/dL (ref 0–99)
Total CHOL/HDL Ratio: 3 RATIO
Triglycerides: 52 mg/dL (ref <150)
VLDL: 10 mg/dL (ref 0–40)

## 2020-05-27 LAB — TROPONIN I (HIGH SENSITIVITY)
Troponin I (High Sensitivity): 69 ng/L — ABNORMAL HIGH (ref <18)
Troponin I (High Sensitivity): 86 ng/L — ABNORMAL HIGH (ref <18)

## 2020-05-27 LAB — HEMOGLOBIN A1C
Hgb A1c MFr Bld: 5.8 % — ABNORMAL HIGH (ref 4.8–5.6)
Mean Plasma Glucose: 119.76 mg/dL

## 2020-05-27 LAB — MRSA PCR SCREENING: MRSA by PCR: NEGATIVE

## 2020-05-27 MED ORDER — CHLORHEXIDINE GLUCONATE CLOTH 2 % EX PADS
6.0000 | MEDICATED_PAD | Freq: Every day | CUTANEOUS | Status: DC
  Administered 2020-05-27 – 2020-06-01 (×6): 6 via TOPICAL

## 2020-05-27 MED ORDER — SODIUM CHLORIDE 0.9 % IV BOLUS
1000.0000 mL | Freq: Once | INTRAVENOUS | Status: AC
  Administered 2020-05-27: 1000 mL via INTRAVENOUS

## 2020-05-27 MED ORDER — SODIUM CHLORIDE 0.9 % IV SOLN: INTRAVENOUS | Status: DC

## 2020-05-27 MED ORDER — DILTIAZEM LOAD VIA INFUSION
10.0000 mg | Freq: Once | INTRAVENOUS | Status: AC
  Administered 2020-05-27: 10 mg via INTRAVENOUS
  Filled 2020-05-27: qty 10

## 2020-05-27 MED ORDER — DILTIAZEM HCL 25 MG/5ML IV SOLN
15.0000 mg | Freq: Once | INTRAVENOUS | Status: AC
  Administered 2020-05-27: 15 mg via INTRAVENOUS
  Filled 2020-05-27: qty 5

## 2020-05-27 MED ORDER — PRAVASTATIN SODIUM 20 MG PO TABS
20.0000 mg | ORAL_TABLET | Freq: Every day | ORAL | Status: DC
  Administered 2020-05-28 – 2020-06-03 (×6): 20 mg via ORAL
  Filled 2020-05-27 (×6): qty 1

## 2020-05-27 MED ORDER — TRAMADOL HCL 50 MG PO TABS
50.0000 mg | ORAL_TABLET | Freq: Four times a day (QID) | ORAL | Status: DC | PRN
  Administered 2020-06-01 – 2020-06-03 (×4): 50 mg via ORAL
  Filled 2020-05-27 (×5): qty 1

## 2020-05-27 MED ORDER — DILTIAZEM HCL-DEXTROSE 125-5 MG/125ML-% IV SOLN (PREMIX)
5.0000 mg/h | INTRAVENOUS | Status: DC
  Administered 2020-05-27 (×2): 5 mg/h via INTRAVENOUS
  Filled 2020-05-27 (×2): qty 125

## 2020-05-27 NOTE — Consult Note (Signed)
Consultation  Referring Provider: [facility] Primary Care Physician:  Aretta Nip, MD Primary Gastroenterologist:  Sharee Holster Joelyn Oms  Reason for Consultation: Colon obstruction  HPI: Kathy Robinson is a 79 y.o. female, generally in good health without any known chronic medical problems.  She says she started noticing occasional sharp pains in her abdomen in March and April, but was not having any ongoing abdominal pain or discomfort.  She has had some mild bloating.  She had not noticed any changes in her bowel habits melena or hematochezia and had been able to eat without difficulty.  Earlier this week she developed a sense of abdominal distention, and had not been able to eat well over the few days prior to that and had altered her diet to very soft foods.  She began having nausea and vomiting.  She called her primary care physician was referred to GI, and set up for CT of the abdomen and pelvis which was done yesterday.  In the interim she continued to have nausea and vomiting and inability to eat. CT scan shows a long segment of masslike circumferential thickening of the descending colon with associated high-grade narrowing and obstruction measuring about 15 cm in length.  There is stranding and nodularity of the surrounding mesocolon There is apparent focal area of disc continuity of the colonic wall with an ill-defined 8 mm extracolonic collection containing tiny pockets of air consistent with focal contained microperforation, no drainable fluid collection or abscess there is distention of the colon proximal to this segment no small bowel dilation.  There are several small hepatic hypodense lesions measuring up to 7 mm in the dome of the liver indeterminate and too small to characterize. There is retroperitoneal adenopathy.  WBC yesterday on admit was 12.7, up to 17.7 today.  She has been started on Cefotan. Hemoglobin 12.9 hematocrit 38.7, LFTs within normal limits.  Patient  went into A. fib last night with rate in the 160s.  She has been started on Cardizem drip and cardiology is to see today.  She is currently comfortable has not had any vomiting as long as she does not try to eat and is not having any severe pain.    Past Medical History:  Diagnosis Date  . Cancer ([place])  most recent 01/16/2013   h/o basal cell, squamous cell ca rt upper lip  . Hyperlipemia     Past Surgical History:  Procedure Laterality Date  . COLONOSCOPY  02/00   Dr. Deatra Ina    Prior to Admission medications   Medication Sig Start Date End Date Taking? Authorizing Provider  CALCIUM PO Take by mouth.   Yes [provider]  fluorouracil (EFUDEX) 5 % cream  11/21/12  Yes [provider]  Multiple Vitamins-Minerals (MULTI COMPLETE PO) Take by mouth.   Yes [provider]  multivitamin-lutein (OCUVITE-LUTEIN) CAPS Take 1 capsule by mouth daily.   Yes [provider]  pravastatin (PRAVACHOL) 20 MG tablet Take 20 mg by mouth daily. 05/13/20  Yes [provider]  Vitamin D, Ergocalciferol, (DRISDOL) 50000 UNITS CAPS Take 50,000 Units by mouth every 30 (thirty) days.    Yes [provider]    Current Facility-Administered Medications  Medication Dose Route Frequency Provider Last Rate Last Admin  . 0.9 %  sodium chloride infusion   Intravenous Continuous Lacretia Leigh, MD 125 mL/hr at 05/27/20 0547 New Bag at 05/27/20 0547  . acetaminophen (TYLENOL) tablet 650 mg  650 mg Oral Q6H PRN Armandina Gemma,  MD       Or  . acetaminophen (TYLENOL) suppository 650 mg  650 mg Rectal Q6H PRN Armandina Gemma, MD      . cefoTEtan (CEFOTAN) 2 g in sodium chloride 0.9 % 100 mL IVPB  2 g Intravenous Q12H Armandina Gemma, MD   Stopped at 05/27/20 0406  . dextrose 5 % and 0.45 % NaCl with KCl 20 mEq/L infusion   Intravenous Continuous Armandina Gemma, MD   Held at 05/27/20 [phone]  . diltiazem (CARDIZEM) 125 mg in dextrose 5% 125 mL (1 mg/mL) infusion  5-15 mg/hr  Intravenous Continuous Rancour, Stephen, MD 12.5 mL/hr at 05/27/20 0548 12.5 mg/hr at 05/27/20 0548  . enoxaparin (LOVENOX) injection 30 mg  30 mg Subcutaneous Q24H Armandina Gemma, MD      . HYDROmorphone (DILAUDID) injection 1 mg  1 mg Intravenous Q2H PRN Armandina Gemma, MD      . ondansetron (ZOFRAN-ODT) disintegrating tablet 4 mg  4 mg Oral Q6H PRN Armandina Gemma, MD       Or  . ondansetron (ZOFRAN) injection 4 mg  4 mg Intravenous Q6H PRN Armandina Gemma, MD      . oxyCODONE (Oxy IR/ROXICODONE) immediate release tablet 5-10 mg  5-10 mg Oral Q4H PRN Armandina Gemma, MD      . sodium chloride flush (NS) 0.9 % injection 3 mL  3 mL Intravenous Once Lacretia Leigh, MD      . traMADol Veatrice Bourbon) tablet 50 mg  50 mg Oral Q6H PRN Armandina Gemma, MD       Current Outpatient Medications  Medication Sig Dispense Refill  . CALCIUM PO Take by mouth.    . fluorouracil (EFUDEX) 5 % cream     . Multiple Vitamins-Minerals (MULTI COMPLETE PO) Take by mouth.    . multivitamin-lutein (OCUVITE-LUTEIN) CAPS Take 1 capsule by mouth daily.    . pravastatin (PRAVACHOL) 20 MG tablet Take 20 mg by mouth daily.    . Vitamin D, Ergocalciferol, (DRISDOL) 50000 UNITS CAPS Take 50,000 Units by mouth every 30 (thirty) days.       Allergies as of 05/26/2020 - Review Complete 05/26/2020  Allergen Reaction Noted  . Codeine  04/21/2010  . Neosporin [neomycin-bacitracin zn-polymyx] Swelling 02/11/2013  . Tape  05/26/2020    Family History  Problem Relation Age of Onset  . Osteoporosis Mother   . Cancer Mother   . Cancer Maternal Grandmother        colon  . AAA (abdominal aortic aneurysm) Father     Social History   Socioeconomic History  . Marital status: Single    Spouse name: Not on file  . Number of children: Not on file  . Years of education: Not on file  . Highest education level: Not on file  Occupational History  . Not on file  Tobacco Use  . Smoking status: Never Smoker  . Smokeless tobacco: Never Used    Vaping Use  . Vaping Use: Never used  Substance and Sexual Activity  . Alcohol use: Yes    Alcohol/week: 3.0 standard drinks    Types: 3 Standard drinks or equivalent per week    Comment: 3 glasses of wine a wk  . Drug use: No  . Sexual activity: Never    Partners: Male  Other Topics Concern  . Not on file  Social History Narrative  . Not on file   Social Determinants of Health   Financial Resource Strain:   . Difficulty of Paying Living Expenses:  Food Insecurity:   . Worried About Charity fundraiser in the Last Year:   . Arboriculturist in the Last Year:   Transportation Needs:   . Film/video editor (Medical):   Marland Kitchen Lack of Transportation (Non-Medical):   Physical Activity:   . Days of Exercise per Week:   . Minutes of Exercise per Session:   Stress:   . Feeling of Stress :   Social Connections:   . Frequency of Communication with Friends and Family:   . Frequency of Social Gatherings with Friends and Family:   . Attends Religious Services:   . Active Member of Clubs or Organizations:   . Attends Archivist Meetings:   Marland Kitchen Marital Status:   Intimate Partner Violence:   . Fear of Current or Ex-Partner:   . Emotionally Abused:   Marland Kitchen Physically Abused:   . Sexually Abused:     Review of Systems: Pertinent positive and negative review of systems were noted in the above HPI section.  All other review of systems was otherwise negative.  Physical Exam: Vital signs in last 24 hours: Temp:  [98.2 F (36.8 C)-98.3 F (36.8 C)] 98.3 F (36.8 C) (07/08 2021) Pulse Rate:  [70-175] 120 (07/09 0808) Resp:  [16-22] 22 (07/09 0808) BP: (104-152)/(62-119) 112/62 (07/09 0808) SpO2:  [90 %-99 %] 94 % (07/09 0808) Weight:  [63.5 kg] 63.5 kg (07/08 2021)   General:   Alert,  Well-developed, well-nourished, older white female pleasant and cooperative in NAD Head:  Normocephalic and atraumatic. Eyes:  Sclera clear, no icterus.   Conjunctiva pink. Ears:  Normal  auditory acuity. Nose:  No deformity, discharge,  or lesions. Mouth:  No deformity or lesions.   Neck:  Supple; no masses or thyromegaly. Lungs:  Clear throughout to auscultation.   No wheezes, crackles, or rhonchi. Heart: Irregular regular rate and rhythm, tachycardic; no murmurs, clicks, rubs,  or gallops. Abdomen:  Soft, there is mild distention, she is tender with deep palpation in the left mid and left lower quadrant, bowel sounds quiet Rectal:  Deferred  Msk:  Symmetrical without gross deformities. . Pulses:  Normal pulses noted. Extremities:  Without clubbing or edema. Neurologic:  Alert and  oriented x4;  grossly normal neurologically. Skin:  Intact without significant lesions or rashes.. Psych:  Alert and cooperative. Normal mood and affect.  Intake/Output from previous day: No intake/output data recorded. Intake/Output this shift: No intake/output data recorded.  Lab Results: Recent Labs    05/26/20 1745 05/27/20 0307  WBC 12.7* 17.7*  HGB 14.7 12.9  HCT 43.9 38.7  PLT 428* 358   BMET Recent Labs    05/26/20 1745 05/27/20 0307  NA 139  --   K 3.9  --   CL 97*  --   CO2 28  --   GLUCOSE 128*  --   BUN 19  --   CREATININE 0.80 0.83  CALCIUM 9.9  --    LFT Recent Labs    05/26/20 1745  PROT 8.5*  ALBUMIN 4.2  AST 22  ALT 16  ALKPHOS 60  BILITOT 0.7   PT/INR No results for input(s): LABPROT, INR in the last 72 hours. Hepatitis Panel No results for input(s): HEPBSAG, HCVAB, HEPAIGM, HEPBIGM in the last 72 hours.   IMPRESSION:  #38 79 year old white female with 54-month history of vague abdominal symptoms with intermittent abdominal pains, and some bloating who developed progressive abdominal discomfort and nausea and vomiting earlier this week with  inability to keep down p.o.'s. Work-up is consistent with an obstructing colon mass of the descending colon with associated microperforation, and stranding and nodularity of the surrounding mesocolon.  She  also has retroperitoneal adenopathy.  Has developed progressive leukocytosis and atrial fibrillation overnight.    PLAN: No role for flexible sigmoidoscopy and biopsy in this situation.  She has a microperforation and flexible sigmoidoscopy with insufflation of any air will make this worse.  Situation was discussed with the patient in detail. Patient needs surgical intervention for resection and colostomy. Thank you, GI will sign off, available if needed.   Tashica Provencio EsterwoodPA-C  05/27/2020, 8:44 AM

## 2020-05-27 NOTE — ED Provider Notes (Addendum)
Notified by nursing staff that patient was tachycardic in the 160s to 180s.  EKG shows atrial fibrillation with RVR.  No history of the same.  Patient denies any chest pain or shortness of breath.  Denies any history of atrial fibrillation.  Patient is admitted to the surgical service.  Nursing staff is informing them.  We will start IV Cardizem gtt. Holding anticoagulation at this time given possible need for surgery.  Nursing d/w Dr. Harlow Asa.  D/w Dr. Harlow Asa.  He is requesting internal medicine consult given persistent tachycardia on Cardizem drip.  Discussed with Dr. Alcario Drought.  ED ECG REPORT   Date: 05/27/2020  Rate: 168  Rhythm: atrial fibrillation  QRS Axis: normal  Intervals: normal  ST/T Wave abnormalities: nonspecific ST/T changes  Conduction Disutrbances:none  Narrative Interpretation:   Old EKG Reviewed: none available  I have personally reviewed the EKG tracing and agree with the computerized printout as noted.  CRITICAL CARE Performed by: Ezequiel Essex Total critical care time: 35 minutes Critical care time was exclusive of separately billable procedures and treating other patients. Critical care was necessary to treat or prevent imminent or life-threatening deterioration. Critical care was time spent personally by me on the following activities: development of treatment plan with patient and/or surrogate as well as nursing, discussions with consultants, evaluation of patient's response to treatment, examination of patient, obtaining history from patient or surrogate, ordering and performing treatments and interventions, ordering and review of laboratory studies, ordering and review of radiographic studies, pulse oximetry and re-evaluation of patient's condition.    Ezequiel Essex, MD 05/27/20 Anola Gurney    Ezequiel Essex, MD 05/27/20 [phone]

## 2020-05-27 NOTE — Progress Notes (Signed)
  Echocardiogram 2D Echocardiogram has been performed.  Kathy Robinson 05/27/2020, 12:40 PM

## 2020-05-27 NOTE — H&P (Signed)
History and Physical:    Kathy Robinson   ELF:[id number] DOB: [birth year] DOA: 05/26/2020  Referring MD/provider: Dr. Wyvonnia Dusky PCP: Aretta Nip, MD   Patient coming from: Home  Chief Complaint: Intractable vomiting in setting of known new diagnosis of colonic mass  History of Present Illness:   Kathy Robinson is an otherwise healthy 79 y.o. female is admitted with intractable vomiting and new diagnosis of colonic mass.  Patient was in her usual state of excellent health until March of this year when she noted some left lower quadrant discomfort with bowel movements.  In June of this year she noted early satiety and perhaps some increased left lower quadrant vague discomfort.  Went to see her PCP on June 22 and a colonoscopy was ordered.  Last week patient developed increasing abdominal pain now associated with vomiting.  Patient went to see her PCP who ordered a CT which revealed a new large colonic mass.  Patient was discharged home with antinausea medicines however was unable to keep any food or medicines down.  She had been told to go to the ED for IV fluids if vomiting persisted.  Patient presented yesterday for IV fluids and intractable vomiting.  While in the ED patient was noted to incidentally be in atrial fibrillation with RVR heart rate of 168.  Patient was entirely asymptomatic during this.  Patient states she did not know that her heart was doing anything unusual.  She specifically denies any chest pain palpitations or shortness of breath.  She otherwise denies fevers chills malaise dizziness syncope presyncope or weakness.  ED Course: Patient underwent a CT which revealed a masslike circumferential thickening of the descending colon consistent with malignancy associated high-grade colonic obstruction as well as microperforation but no drainable abscess noted.  She also has retroperitoneal adenopathy.  Patient was initially admitted by general surgery however when  she developed new onset A. fib with RVR general surgery requested tried hospitalist to admit.  Patient was started on with a diltiazem drip.  ROS:   ROS   Review of Systems: General: Denies fever, chills, malaise,  Respiratory: Denies cough, SOB at rest or hemoptysis Cardiovascular: Denies chest pain or palpitations GU: Denies dysuria, frequency or hematuria CNS: Denies HA, dizziness, confusion, new weakness or clumsiness. Blood/lymphatics: Denies easy bruising or bleeding Mood/affect: Denies anxiety/depression    Past Medical History:   Past Medical History:  Diagnosis Date  . Cancer ([place])  most recent 01/16/2013   h/o basal cell, squamous cell ca rt upper lip  . Hyperlipemia     Past Surgical History:   Past Surgical History:  Procedure Laterality Date  . COLONOSCOPY  02/00   Dr. Deatra Ina    Social History:   Social History   Socioeconomic History  . Marital status: Single    Spouse name: Not on file  . Number of children: Not on file  . Years of education: Not on file  . Highest education level: Not on file  Occupational History  . Not on file  Tobacco Use  . Smoking status: Never Smoker  . Smokeless tobacco: Never Used  Vaping Use  . Vaping Use: Never used  Substance and Sexual Activity  . Alcohol use: Yes    Alcohol/week: 3.0 standard drinks    Types: 3 Standard drinks or equivalent per week    Comment: 3 glasses of wine a wk  . Drug use: No  . Sexual activity: Never    Partners: Male  Other  Topics Concern  . Not on file  Social History Narrative  . Not on file   Social Determinants of Health   Financial Resource Strain:   . Difficulty of Paying Living Expenses:   Food Insecurity:   . Worried About Charity fundraiser in the Last Year:   . Arboriculturist in the Last Year:   Transportation Needs:   . Film/video editor (Medical):   Marland Kitchen Lack of Transportation (Non-Medical):   Physical Activity:   . Days of Exercise per Week:   . Minutes of  Exercise per Session:   Stress:   . Feeling of Stress :   Social Connections:   . Frequency of Communication with Friends and Family:   . Frequency of Social Gatherings with Friends and Family:   . Attends Religious Services:   . Active Member of Clubs or Organizations:   . Attends Archivist Meetings:   Marland Kitchen Marital Status:   Intimate Partner Violence:   . Fear of Current or Ex-Partner:   . Emotionally Abused:   Marland Kitchen Physically Abused:   . Sexually Abused:     Allergies   Codeine, Neosporin [neomycin-bacitracin zn-polymyx], and Tape  Family history:   Family History  Problem Relation Age of Onset  . Osteoporosis Mother   . Cancer Mother   . Cancer Maternal Grandmother        colon  . AAA (abdominal aortic aneurysm) Father     Current Medications:   Prior to Admission medications   Medication Sig Start Date End Date Taking? Authorizing Provider  CALCIUM PO Take by mouth.   Yes [provider]  fluorouracil (EFUDEX) 5 % cream  11/21/12  Yes [provider]  Multiple Vitamins-Minerals (MULTI COMPLETE PO) Take by mouth.   Yes [provider]  multivitamin-lutein (OCUVITE-LUTEIN) CAPS Take 1 capsule by mouth daily.   Yes [provider]  pravastatin (PRAVACHOL) 20 MG tablet Take 20 mg by mouth daily. 05/13/20  Yes [provider]  Vitamin D, Ergocalciferol, (DRISDOL) 50000 UNITS CAPS Take 50,000 Units by mouth every 30 (thirty) days.    Yes [provider]    Physical Exam:   Vitals:   05/27/20 0645 05/27/20 0700 05/27/20 0726 05/27/20 0808  BP: 104/74 109/88 121/67 112/62  Pulse: 70  (!) 138 (!) 120  Resp: 16 (!) 21 17 (!) 22  Temp:      TempSrc:      SpO2: 93%  94% 94%  Weight:      Height:        Physical Exam: Blood pressure 112/62, pulse (!) 120, temperature 98.3 F (36.8 C), temperature source Oral, resp. rate (!) 22, height 5\' 6"  (1.676 m), weight 63.5 kg, last menstrual period 11/19/1996, SpO2 94  %. Gen: Pleasant well-appearing female lying flat in bed in no acute distress. Eyes: sclera anicteric, conjuctiva mildly injected bilaterally CVS: S1-S2, tacky, irregularly irregular Respiratory: CTA B  GI: Mildly distended, absent bowel sounds, no tenderness to light or deep palpation.  No clear mass palpable. LE: No edema. No cyanosis Neuro: A/O x 3, Moving all extremities equally with normal strength, CN 3-12 intact, grossly nonfocal.  Psych: patient is logical and coherent, judgement and insight appear normal, mood and affect appropriate to situation. Skin: no rashes or lesions or ulcers,    Data Review:    Labs: Basic Metabolic Panel: Recent Labs  Lab 05/26/20 1745 05/27/20 0307  NA 139  --   K 3.9  --  CL 97*  --   CO2 28  --   GLUCOSE 128*  --   BUN 19  --   CREATININE 0.80 0.83  CALCIUM 9.9  --    Liver Function Tests: Recent Labs  Lab 05/26/20 1745  AST 22  ALT 16  ALKPHOS 60  BILITOT 0.7  PROT 8.5*  ALBUMIN 4.2   Recent Labs  Lab 05/26/20 1745  LIPASE 18   No results for input(s): AMMONIA in the last 168 hours. CBC: Recent Labs  Lab 05/26/20 1745 05/27/20 0307  WBC 12.7* 17.7*  HGB 14.7 12.9  HCT 43.9 38.7  MCV 95.4 96.3  PLT 428* 358   Cardiac Enzymes: No results for input(s): CKTOTAL, CKMB, CKMBINDEX, TROPONINI in the last 168 hours.  BNP (last 3 results) No results for input(s): PROBNP in the last 8760 hours. CBG: No results for input(s): GLUCAP in the last 168 hours.  Urinalysis    Component Value Date/Time   COLORURINE YELLOW 05/26/2020 1635   APPEARANCEUR CLEAR 05/26/2020 1635   LABSPEC 1.025 05/26/2020 1635   PHURINE 5.0 05/26/2020 1635   GLUCOSEU NEGATIVE 05/26/2020 1635   HGBUR MODERATE (A) 05/26/2020 1635   BILIRUBINUR NEGATIVE 05/26/2020 1635   KETONESUR 80 (A) 05/26/2020 1635   PROTEINUR 30 (A) 05/26/2020 1635   NITRITE NEGATIVE 05/26/2020 1635   LEUKOCYTESUR NEGATIVE 05/26/2020 1635      Radiographic  Studies: CT ABDOMEN PELVIS W CONTRAST  Result Date: 05/26/2020 CLINICAL DATA:  79 year old female with bloating x2 months. Nausea and vomiting. Patient is unable to eat or drink. EXAM: CT ABDOMEN AND PELVIS WITH CONTRAST TECHNIQUE: Multidetector CT imaging of the abdomen and pelvis was performed using the standard protocol following bolus administration of intravenous contrast. CONTRAST:  116mL ISOVUE-300 IOPAMIDOL (ISOVUE-300) INJECTION 61% COMPARISON:  None. FINDINGS: Lower chest: The visualized lung bases are clear. No intra-abdominal free air. There is a small free fluid in the pelvis. Hepatobiliary: Several small hepatic hypodense lesions measure up to 7 mm in the dome of the liver, indeterminate and too small to characterize. The liver is otherwise unremarkable. No intrahepatic biliary ductal dilatation. The gallbladder is unremarkable. Pancreas: Unremarkable. No pancreatic ductal dilatation or surrounding inflammatory changes. Spleen: Normal in size without focal abnormality. Adrenals/Urinary Tract: The adrenal glands unremarkable. The kidneys, visualized ureters, and urinary bladder appear unremarkable. Stomach/Bowel: There is a long segment masslike circumferential thickening of the descending colon with associated high-grade luminal narrowing and resulting colonic obstruction. This measures at least 15 cm in length. There is stranding and nodularity of the surrounding mesocolon. A superimposed acute inflammatory process such as colitis is not excluded. There is apparent focal area of discontinuity of the colonic wall with an ill-defined 8 mm extra colonic collection containing tiny pockets of air (40/2), likely a focally contained microperforation. No drainable fluid collection or abscess. There is distention of the colon proximal to this segment. No evidence of small-bowel dilatation. The appendix is normal. Vascular/Lymphatic: Moderate aortoiliac atherosclerotic disease. The IVC is unremarkable. No  portal venous gas. There is retroperitoneal adenopathy. Reproductive: The uterus is anteverted and grossly unremarkable. Other: None Musculoskeletal: Degenerative changes of the spine. No acute osseous pathology. IMPRESSION: 1. Long segment masslike circumferential thickening of the descending colon most consistent with malignancy. There is associated high-grade luminal narrowing/stricture and resulting colonic obstruction. 2. Small focal area of colonic wall defect in the medial mid descending colon with evidence of focally contained microperforation. No drainable fluid collection or abscess. 3. Retroperitoneal adenopathy. 4. Small hepatic  hypodense lesions, indeterminate and too small to characterize. 5. Aortic Atherosclerosis (ICD10-I70.0). These results were called by telephone at the time of interpretation on 05/26/2020 at 8:45 pm to provider Dr. Antony Haste, who verbally acknowledged these results. Electronically Signed   By: Anner Crete M.D.   On: 05/26/2020 20:46    EKG: Independently reviewed.  A. fib at 170.  Scooping of T waves 2 3 and F with T wave inversions V4 through V6.   Assessment/Plan:   Principal Problem:   Colonic obstruction (HCC) Active Problems:   Colonic mass   Atrial fibrillation with RVR [facility])  Otherwise healthy 79 year old female is admitted with new diagnosis of colonic mass with obstruction and microperforation.  She is also noted to have new onset atrial fibrillation with RVR now on a diltiazem drip.  Colonic mass with obstruction and microperforation Patient was seen by general surgery who will be managing diagnosis and treatment of colonic mass. She was started on Kefzol for her microperforation At present she is n.p.o. and is not vomiting so no NG tube is required. Ongoing management per general surgery  New diagnosis of A. fib with RVR Patient is entirely chest pain-free and is comfortable, does not even feel any palpitations Has no previous history of cardiac  disease, denies history of congestive heart failure Patient is maxed out on diltiazem drip after receiving 2 boluses of diltiazem. Heart rate is in the 120s unless she is speaking to somebody in which case it goes up to the 140s. Cardiology consultation is called for peri-operative management of RVR Initial EKG showed some T wave inversions which may have been rate related given absence of chest pain however stat troponins and repeat EKG are ordered. Will order echocardiogram  Patient is on pravastatin at home, this is ordered to start tomorrow morning.   Other information:   DVT prophylaxis: SCD ordered. Code Status: Full Family Communication: Patient states her husband is aware she is here Disposition Plan: Home Consults called: General surgery, cardiology Admission status: Inpatient  Leaann Nevils Tublu Marijke Guadiana [facility]  If 7PM-7AM, please contact night-coverage [email or web address] Password [facility] 05/27/2020, 8:46 AM

## 2020-05-27 NOTE — Progress Notes (Signed)
Subjective: No complaints.  No abdominal pain, bloated.  No flatus.  No BM since Wednesday and it was very small despite miralax.  Noted to be in new onset a fib with rvr this am  ROS: See above, otherwise other systems negative  Objective: Vital signs in last 24 hours: Temp:  [98.2 F (36.8 C)-98.3 F (36.8 C)] 98.3 F (36.8 C) (07/08 2021) Pulse Rate:  [70-175] 120 (07/09 0808) Resp:  [16-22] 22 (07/09 0808) BP: (104-152)/(62-119) 112/62 (07/09 0808) SpO2:  [90 %-99 %] 94 % (07/09 0808) Weight:  [63.5 kg] 63.5 kg (07/08 2021)    Intake/Output from previous day: No intake/output data recorded. Intake/Output this shift: No intake/output data recorded.  PE: Gen: NAD HEENT: PERRL Neck: trachea midline Heart: irregularly irregular, tachy in 140s Lungs: CTAB Abd: soft, but distended, nontender, +BS Ext: no edema, MAE Neuro: sensation normal  Psych: A&Ox3  Lab Results:  Recent Labs    05/26/20 1745 05/27/20 0307  WBC 12.7* 17.7*  HGB 14.7 12.9  HCT 43.9 38.7  PLT 428* 358   BMET Recent Labs    05/26/20 1745 05/27/20 0307  NA 139  --   K 3.9  --   CL 97*  --   CO2 28  --   GLUCOSE 128*  --   BUN 19  --   CREATININE 0.80 0.83  CALCIUM 9.9  --    PT/INR No results for input(s): LABPROT, INR in the last 72 hours. CMP     Component Value Date/Time   NA 139 05/26/2020 1745   K 3.9 05/26/2020 1745   CL 97 (L) 05/26/2020 1745   CO2 28 05/26/2020 1745   GLUCOSE 128 (H) 05/26/2020 1745   BUN 19 05/26/2020 1745   CREATININE 0.83 05/27/2020 0307   CALCIUM 9.9 05/26/2020 1745   PROT 8.5 (H) 05/26/2020 1745   ALBUMIN 4.2 05/26/2020 1745   AST 22 05/26/2020 1745   ALT 16 05/26/2020 1745   ALKPHOS 60 05/26/2020 1745   BILITOT 0.7 05/26/2020 1745   GFRNONAA >60 05/27/2020 0307   GFRAA >60 05/27/2020 0307   Lipase     Component Value Date/Time   LIPASE 18 05/26/2020 1745       Studies/Results: CT ABDOMEN PELVIS W CONTRAST  Result Date:  05/26/2020 CLINICAL DATA:  79 year old female with bloating x2 months. Nausea and vomiting. Patient is unable to eat or drink. EXAM: CT ABDOMEN AND PELVIS WITH CONTRAST TECHNIQUE: Multidetector CT imaging of the abdomen and pelvis was performed using the standard protocol following bolus administration of intravenous contrast. CONTRAST:  124mL ISOVUE-300 IOPAMIDOL (ISOVUE-300) INJECTION 61% COMPARISON:  None. FINDINGS: Lower chest: The visualized lung bases are clear. No intra-abdominal free air. There is a small free fluid in the pelvis. Hepatobiliary: Several small hepatic hypodense lesions measure up to 7 mm in the dome of the liver, indeterminate and too small to characterize. The liver is otherwise unremarkable. No intrahepatic biliary ductal dilatation. The gallbladder is unremarkable. Pancreas: Unremarkable. No pancreatic ductal dilatation or surrounding inflammatory changes. Spleen: Normal in size without focal abnormality. Adrenals/Urinary Tract: The adrenal glands unremarkable. The kidneys, visualized ureters, and urinary bladder appear unremarkable. Stomach/Bowel: There is a long segment masslike circumferential thickening of the descending colon with associated high-grade luminal narrowing and resulting colonic obstruction. This measures at least 15 cm in length. There is stranding and nodularity of the surrounding mesocolon. A superimposed acute inflammatory process such as colitis is not excluded. There is  apparent focal area of discontinuity of the colonic wall with an ill-defined 8 mm extra colonic collection containing tiny pockets of air (40/2), likely a focally contained microperforation. No drainable fluid collection or abscess. There is distention of the colon proximal to this segment. No evidence of small-bowel dilatation. The appendix is normal. Vascular/Lymphatic: Moderate aortoiliac atherosclerotic disease. The IVC is unremarkable. No portal venous gas. There is retroperitoneal adenopathy.  Reproductive: The uterus is anteverted and grossly unremarkable. Other: None Musculoskeletal: Degenerative changes of the spine. No acute osseous pathology. IMPRESSION: 1. Long segment masslike circumferential thickening of the descending colon most consistent with malignancy. There is associated high-grade luminal narrowing/stricture and resulting colonic obstruction. 2. Small focal area of colonic wall defect in the medial mid descending colon with evidence of focally contained microperforation. No drainable fluid collection or abscess. 3. Retroperitoneal adenopathy. 4. Small hepatic hypodense lesions, indeterminate and too small to characterize. 5. Aortic Atherosclerosis (ICD10-I70.0). These results were called by telephone at the time of interpretation on 05/26/2020 at 8:45 pm to provider Dr. Antony Haste, who verbally acknowledged these results. Electronically Signed   By: Anner Crete M.D.   On: 05/26/2020 20:46    Anti-infectives: Anti-infectives (From admission, onward)   Start     Dose/Rate Route Frequency Ordered Stop   05/27/20 0100  cefoTEtan (CEFOTAN) 2 g in sodium chloride 0.9 % 100 mL IVPB     Discontinue     2 g 200 mL/hr over 30 Minutes Intravenous Every 12 hours 05/26/20 2354         Assessment/Plan New onset A fib with rvr -  Currently on cardizem gtt by EDP.  Cardiology consulted this am for further management.  Defer initiation of anticoagulation due to obstructive work up and need for procedures  Colon obstruction -This is felt to possibly be malignant in nature based off of her films -I have called GI to see the patient for flex sig today to evaluate this obstruction and for biopsy to help determine further surgical plans -likely will need colectomy/ostomy at some point during this stay.  -cont NPO for now for possible procedures -cefotetan for possible perforation at tumor site -CEA pending -WBC 17K, recheck in am  FEN - NPO/IVFs/cardizem gtt VTE - lovenox 30mg  daily ID -  cefotetan   LOS: 1 day    Henreitta Cea , [facility] 05/27/2020, 8:30 AM Please see Amion for pager number during day hours 7:00am-4:30pm or 7:00am -11:30am on weekends

## 2020-05-27 NOTE — Consult Note (Addendum)
Cardiology Consultation:   Patient ID: Kathy Robinson; [id number]; 01/05/1941   Admit date: 05/26/2020 Date of Consult: 05/27/2020  Primary Care Provider: Aretta Nip, MD Primary Cardiologist: New to [place]; Dr. Sallyanne Kuster Primary Electrophysiologist:  None   Patient Profile:   Kathy Robinson is a 79 y.o. female with recent diagnosis of a colonic mass, HLD, and skin cancer, who is being seen today for the evaluation of new onset atrial fibrillation with RVR at the request of Dr. Jamse Arn.  History of Present Illness:   [name] was in her usual state of health until March 2021 when she began experiencing vague LLQ pain. She subsequently developed early satiety and increase in LLQ pain over the past week, prompting a referral to GI. She developed abdominal distention, nausea, and intractable vomiting. She underwent a CT A/P yesterday which revealed a new large colonic mass in her descending colon. She was referred to the ED for further evaluation. GI evaluated the patient and advised agains a flex sig for biopsy as poor visualization without prep could likely make her situation worse. General surgery admitted the patient overnight for possible surgical resection of her colonic mass. Shortly after being admitted she developed elevated HR's in the 150s-160s. EKG showed atrial fibrillation with RVR. She was started on a diltiazem gtt for rate control. Cardiology was asked to evaluate.  She has no prior cardiac history. She is quite active at baseline - walking several days per week with a weighted backpack and lifting weights. She can walk any distance, including hills, and go up stairs without difficulty. She has never experienced chest pain before and has no complaints of SOB, DOE, or claudication . She denies significant family history of CAD or CHF but reports her father died from a ruptured aortic aneurysm in his 80s. She has no history of tobacco or illicit drug use.  She drinks an occasional glass of wine 2-3 times a week.   When asked about her episode of atrial fibrillation, she had no awareness of her heart racing. She had no complaints of chest pain, SOB, dizziness, lightheadedness, or syncope at that time. She has never had atrial fibrillation in the past. She follows with her PCP annually and has never been noted to have any cardiac issues. She denies history of OSA - no snoring or daytime somnolence. She reports her abdominal pain has improved since arrival to the ED.    Past Medical History:  Diagnosis Date   Cancer ([place])  most recent 01/16/2013   h/o basal cell, squamous cell ca rt upper lip   Hyperlipemia     Past Surgical History:  Procedure Laterality Date   COLONOSCOPY  02/00   Dr. Deatra Ina     Home Medications:  Prior to Admission medications   Medication Sig Start Date End Date Taking? Authorizing Provider  CALCIUM PO Take by mouth.   Yes [provider]  fluorouracil (EFUDEX) 5 % cream  11/21/12  Yes [provider]  Multiple Vitamins-Minerals (MULTI COMPLETE PO) Take by mouth.   Yes [provider]  multivitamin-lutein (OCUVITE-LUTEIN) CAPS Take 1 capsule by mouth daily.   Yes [provider]  pravastatin (PRAVACHOL) 20 MG tablet Take 20 mg by mouth daily. 05/13/20  Yes [provider]  Vitamin D, Ergocalciferol, (DRISDOL) 50000 UNITS CAPS Take 50,000 Units by mouth every 30 (thirty) days.    Yes [provider]    Inpatient Medications: Scheduled Meds:  enoxaparin (LOVENOX) injection  30  mg Subcutaneous Q24H   [START ON 05/28/2020] pravastatin  20 mg Oral Daily   sodium chloride flush  3 mL Intravenous Once   Continuous Infusions:  sodium chloride 150 mL/hr at 05/27/20 0946   cefoTEtan (CEFOTAN) IV Stopped (05/27/20 0406)   dextrose 5 % and 0.45 % NaCl with KCl 20 mEq/L Stopped (05/27/20 0146)   diltiazem (CARDIZEM) infusion 10 mg/hr (05/27/20 0948)   PRN  Meds: acetaminophen **OR** acetaminophen, HYDROmorphone (DILAUDID) injection, ondansetron **OR** ondansetron (ZOFRAN) IV, oxyCODONE, traMADol  Allergies:    Allergies  Allergen Reactions   Codeine     REACTION: nausea   Neosporin [Neomycin-Bacitracin Zn-Polymyx] Swelling   Tape     Can use paper tape    Social History:   Social History   Socioeconomic History   Marital status: Single    Spouse name: Not on file   Number of children: Not on file   Years of education: Not on file   Highest education level: Not on file  Occupational History   Not on file  Tobacco Use   Smoking status: Never Smoker   Smokeless tobacco: Never Used  Vaping Use   Vaping Use: Never used  Substance and Sexual Activity   Alcohol use: Yes    Alcohol/week: 3.0 standard drinks    Types: 3 Standard drinks or equivalent per week    Comment: 3 glasses of wine a wk   Drug use: No   Sexual activity: Never    Partners: Male  Other Topics Concern   Not on file  Social History Narrative   Not on file   Social Determinants of Health   Financial Resource Strain:    Difficulty of Paying Living Expenses:   Food Insecurity:    Worried About Charity fundraiser in the Last Year:    Arboriculturist in the Last Year:   Transportation Needs:    Film/video editor (Medical):    Lack of Transportation (Non-Medical):   Physical Activity:    Days of Exercise per Week:    Minutes of Exercise per Session:   Stress:    Feeling of Stress :   Social Connections:    Frequency of Communication with Friends and Family:    Frequency of Social Gatherings with Friends and Family:    Attends Religious Services:    Active Member of Clubs or Organizations:    Attends Music therapist:    Marital Status:   Intimate Partner Violence:    Fear of Current or Ex-Partner:    Emotionally Abused:    Physically Abused:    Sexually Abused:     Family History:    Family History  Problem Relation Age of  Onset   Osteoporosis Mother    Cancer Mother    Cancer Maternal Grandmother        colon   AAA (abdominal aortic aneurysm) Father      ROS:  Please see the history of present illness.  ROS  All other ROS reviewed and negative.     Physical Exam/Data:   Vitals:   05/27/20 0912 05/27/20 0916 05/27/20 0931 05/27/20 1008  BP: 111/75 115/71 96/68 116/61  Pulse: (!) 136 (!) 134 89 70  Resp: (!) 21 20 19  (!) 21  Temp:      TempSrc:      SpO2: 93% 92% 92% 93%  Weight:      Height:       No intake or output data  in the 24 hours ending 05/27/20 1017 Filed Weights   05/26/20 1630 05/26/20 2021  Weight: 63.5 kg 63.5 kg   Body mass index is 22.6 kg/m.  General:  Well nourished, well developed, in no acute distress HEENT: sclera anicteric  Neck: no JVD Vascular: No carotid bruits; distal pulses 2+ bilaterally Cardiac:  normal S1, S2; RRR; no murmurs, rubs, or gallops Lungs:  clear to auscultation bilaterally, no wheezing, rhonchi or rales  Abd: NABS, soft, nontender, no hepatomegaly Ext: no edema Musculoskeletal:  No deformities, BUE and BLE strength normal and equal Skin: warm and dry  Neuro:  CNs 2-12 intact, no focal abnormalities noted Psych:  Normal affect   EKG:  The EKG was personally reviewed and demonstrates:  Atrial fibrillation with RVR, rate 168 bpm, likely rate related early repol abnormalities, no STE/D, no comparison Telemetry:  Telemetry was personally reviewed and demonstrates:  Atrial fibrillation with RVR with rates in the 150s-180s, now back in sinus rhythm with HR in the 70s.   Relevant CV Studies: Echo pending  Laboratory Data:  Chemistry Recent Labs  Lab 05/26/20 1745 05/27/20 0307  NA 139  --   K 3.9  --   CL 97*  --   CO2 28  --   GLUCOSE 128*  --   BUN 19  --   CREATININE 0.80 0.83  CALCIUM 9.9  --   GFRNONAA >60 >60  GFRAA >60 >60  ANIONGAP 14  --     Recent Labs  Lab 05/26/20 1745  PROT 8.5*  ALBUMIN 4.2  AST 22  ALT 16   ALKPHOS 60  BILITOT 0.7   Hematology Recent Labs  Lab 05/26/20 1745 05/27/20 0307  WBC 12.7* 17.7*  RBC 4.60 4.02  HGB 14.7 12.9  HCT 43.9 38.7  MCV 95.4 96.3  MCH 32.0 32.1  MCHC 33.5 33.3  RDW 12.4 12.6  PLT 428* 358   Cardiac EnzymesNo results for input(s): TROPONINI in the last 168 hours. No results for input(s): TROPIPOC in the last 168 hours.  BNPNo results for input(s): BNP, PROBNP in the last 168 hours.  DDimer No results for input(s): DDIMER in the last 168 hours.  Radiology/Studies:  CT ABDOMEN PELVIS W CONTRAST  Result Date: 05/26/2020 CLINICAL DATA:  79 year old female with bloating x2 months. Nausea and vomiting. Patient is unable to eat or drink. EXAM: CT ABDOMEN AND PELVIS WITH CONTRAST TECHNIQUE: Multidetector CT imaging of the abdomen and pelvis was performed using the standard protocol following bolus administration of intravenous contrast. CONTRAST:  167mL ISOVUE-300 IOPAMIDOL (ISOVUE-300) INJECTION 61% COMPARISON:  None. FINDINGS: Lower chest: The visualized lung bases are clear. No intra-abdominal free air. There is a small free fluid in the pelvis. Hepatobiliary: Several small hepatic hypodense lesions measure up to 7 mm in the dome of the liver, indeterminate and too small to characterize. The liver is otherwise unremarkable. No intrahepatic biliary ductal dilatation. The gallbladder is unremarkable. Pancreas: Unremarkable. No pancreatic ductal dilatation or surrounding inflammatory changes. Spleen: Normal in size without focal abnormality. Adrenals/Urinary Tract: The adrenal glands unremarkable. The kidneys, visualized ureters, and urinary bladder appear unremarkable. Stomach/Bowel: There is a long segment masslike circumferential thickening of the descending colon with associated high-grade luminal narrowing and resulting colonic obstruction. This measures at least 15 cm in length. There is stranding and nodularity of the surrounding mesocolon. A superimposed acute  inflammatory process such as colitis is not excluded. There is apparent focal area of discontinuity of the colonic wall with an  ill-defined 8 mm extra colonic collection containing tiny pockets of air (40/2), likely a focally contained microperforation. No drainable fluid collection or abscess. There is distention of the colon proximal to this segment. No evidence of small-bowel dilatation. The appendix is normal. Vascular/Lymphatic: Moderate aortoiliac atherosclerotic disease. The IVC is unremarkable. No portal venous gas. There is retroperitoneal adenopathy. Reproductive: The uterus is anteverted and grossly unremarkable. Other: None Musculoskeletal: Degenerative changes of the spine. No acute osseous pathology. IMPRESSION: 1. Long segment masslike circumferential thickening of the descending colon most consistent with malignancy. There is associated high-grade luminal narrowing/stricture and resulting colonic obstruction. 2. Small focal area of colonic wall defect in the medial mid descending colon with evidence of focally contained microperforation. No drainable fluid collection or abscess. 3. Retroperitoneal adenopathy. 4. Small hepatic hypodense lesions, indeterminate and too small to characterize. 5. Aortic Atherosclerosis (ICD10-I70.0). These results were called by telephone at the time of interpretation on 05/26/2020 at 8:45 pm to provider Dr. Antony Haste, who verbally acknowledged these results. Electronically Signed   By: Anner Crete M.D.   On: 05/26/2020 20:46   DG CHEST PORT 1 VIEW  Result Date: 05/27/2020 CLINICAL DATA:  Preoperative evaluation EXAM: PORTABLE CHEST 1 VIEW COMPARISON:  None. FINDINGS: Mild interstitial prominence is probably chronic. No consolidation or edema. No pleural effusion. Cardiomediastinal contours are within normal limits with normal heart size. IMPRESSION: No acute process in the chest. Electronically Signed   By: Macy Mis M.D.   On: 05/27/2020 09:56    Assessment and  Plan:   1. New onset atrial fibrillation with RVR: patient presented with new colonic mass with N/V c/f obstruction. She arrive in sinus rhythm, then went into atrial fibrillation with RVR around 3am. This is a new diagnosis. No prior cardiac history. She is noted to have moderate aortoiliac atherosclerotic disease on CT A/P. Likely driven by acute illness. She was started on a diltiazem gtt with subsequent return to NSR with HR in the 70s. Presumably this is her first episode of atrial fibrillation but given that she was completely asymptomatic I cannot be 703% certain.  - Echo is pending - do not anticipate further cardiac work-up as this would only delay management of her colonic mass.  - This patients CHA2DS2-VASc Score and unadjusted Ischemic Stroke Rate (% per year) is equal to 4.8 % stroke rate/year from a score of 4 Above score calculated as 1 point each if present [CHF, HTN, DM, Vascular=MI/PAD/Aortic Plaque, Age if 65-74, or Female] Above score calculated as 2 points each if present [Age > 75, or Stroke/TIA/TE] - Given anticipated surgery for management of her colon mass, will hold on anticoagulation at this time. Favor starting eliquis 5mg  BID once no further surgical procedures are warranted and she is cleared to start anticoagulation. Could consider an outpatient monitor to evaluate for recurrence to determine if anticoagulation can be discontinued - Favor continuing diltiazem gtt for now given NPO status - can convert to po post-op.  - Will add on FLP and A1C to labs for risk stratification  2. Elevated troponin: Trop 69 following an ~7hr episode of atrial fibrillation with RVR. No prior cardiac history. Suspect demand ischemia in the setting of #1 - Can trend to peak - Echo pending, though do not anticipate further cardiac work-up at this time as it would only delay management of her colonic mass.   3. Preop assessment: anticipating surgery for management of presumed obstructive colon  mass this admission. She can easily complete 4 METs  without anginal complaints. She has no prior CAD, CHF, DM, CKD, or CVA/TIA history.  - Her revised cardiac risk index is 1 (high risk surgery) with a 6% risk of an adverse cardiac event in the perioperative setting.  - Echo is pending, though do not anticipate further work-up this admission.   4. HLD: no lipids on file but she thinks levels were well controlled on last check in February  - Will check FLP - Continue pravastatin   Dr. Sallyanne Kuster to see.      For questions or updates, please contact [facility] Please consult [email or web address] for contact info under Cardiology/STEMI.   Signed, Abigail Butts, PA-C  05/27/2020 10:17 AM [phone]  I have seen and examined the patient along with Abigail Butts, PA-C .  I have reviewed the chart, notes and new data.  I agree with PA/NP's note.  Key new complaints: no CV complaints. Was completely unaware of arrhythmia during AFib. Key examination changes: normal CV exam, occasional ectopy (PACs on monitor) Key new findings / data: reviewed echo at bedside. Normal study. Normal LV systolic and diastolic function, normal LA size, no valvular abnormalities.  PLAN: CHADSVasc 3-4 (age 45, gender, +/- aortic atherosclerosis). Asymptomatic arrhythmia. After her abdominal surgery recommend DOAC (can be started at DC from hospital). Will then perform some long-term arrhythmia monitoring to see if lifelong anticoagulation is appropriate or whether it would be safe to stop anticoagulatio No indication for antiarrhythmics. Would avoid starting a beta blocker immediately before anticipated abdominal surgery, but start metoprolol if arrhythmia recurs postop. Will follow.  Sanda Klein, MD, [place] [phone] 05/27/2020, 12:31 PM

## 2020-05-28 DIAGNOSIS — K6389 Other specified diseases of intestine: Secondary | ICD-10-CM

## 2020-05-28 LAB — CBC
HCT: 33.7 % — ABNORMAL LOW (ref 36.0–46.0)
Hemoglobin: 10.9 g/dL — ABNORMAL LOW (ref 12.0–15.0)
MCH: 31.7 pg (ref 26.0–34.0)
MCHC: 32.3 g/dL (ref 30.0–36.0)
MCV: 98 fL (ref 80.0–100.0)
Platelets: 325 10*3/uL (ref 150–400)
RBC: 3.44 MIL/uL — ABNORMAL LOW (ref 3.87–5.11)
RDW: 13.2 % (ref 11.5–15.5)
WBC: 11.7 10*3/uL — ABNORMAL HIGH (ref 4.0–10.5)
nRBC: 0 % (ref 0.0–0.2)

## 2020-05-28 LAB — BASIC METABOLIC PANEL
Anion gap: 9 (ref 5–15)
BUN: 19 mg/dL (ref 8–23)
CO2: 19 mmol/L — ABNORMAL LOW (ref 22–32)
Calcium: 7.3 mg/dL — ABNORMAL LOW (ref 8.9–10.3)
Chloride: 111 mmol/L (ref 98–111)
Creatinine, Ser: 0.75 mg/dL (ref 0.44–1.00)
GFR calc Af Amer: >60 mL/min (ref >60)
GFR calc non Af Amer: >60 mL/min (ref >60)
Glucose, Bld: 90 mg/dL (ref 70–99)
Potassium: 3.5 mmol/L (ref 3.5–5.1)
Sodium: 139 mmol/L (ref 135–145)

## 2020-05-28 LAB — CEA: CEA: 13.4 ng/mL — ABNORMAL HIGH (ref 0.0–4.7)

## 2020-05-28 MED ORDER — AMIODARONE HCL 200 MG PO TABS: 200.0000 mg | ORAL_TABLET | Freq: Every day | ORAL | Status: DC

## 2020-05-28 MED ORDER — ENOXAPARIN SODIUM 40 MG/0.4ML ~~LOC~~ SOLN
40.0000 mg | SUBCUTANEOUS | Status: DC
  Administered 2020-05-29: 40 mg via SUBCUTANEOUS
  Filled 2020-05-28: qty 0.4

## 2020-05-28 MED ORDER — POLYETHYLENE GLYCOL 3350 17 GM/SCOOP PO POWD: 1.0000 | Freq: Once | ORAL | Status: DC

## 2020-05-28 MED ORDER — POLYETHYLENE GLYCOL 3350 17 GM/SCOOP PO POWD
0.5000 | Freq: Every day | ORAL | Status: AC
  Administered 2020-05-28 – 2020-05-29 (×2): 127.5 g via ORAL
  Filled 2020-05-28: qty 255

## 2020-05-28 MED ORDER — AMIODARONE HCL 200 MG PO TABS: 400.0000 mg | ORAL_TABLET | Freq: Two times a day (BID) | ORAL | Status: DC

## 2020-05-28 NOTE — Progress Notes (Signed)
Subjective/Chief Complaint: Pt doing well BMx 3 overnight Less abd distention    Objective: Vital signs in last 24 hours: Temp:  [97.6 F (36.4 C)-98.6 F (37 C)] 98.1 F (36.7 C) (07/10 0400) Pulse Rate:  [54-73] 58 (07/10 0600) Resp:  [13-23] 16 (07/10 0600) BP: (108-141)/(49-67) 112/54 (07/10 0600) SpO2:  [90 %-99 %] 92 % (07/10 0600) Weight:  [65.3 kg] 65.3 kg (07/09 1115)    Intake/Output from previous day: 07/09 0701 - 07/10 0700 In: 1361.1 [I.V.:1261.1; IV Piggyback:100] Out: -  Intake/Output this shift: No intake/output data recorded.   PE:  Constitutional: No acute distress, conversant, appears states age. Eyes: Anicteric sclerae, moist conjunctiva, no lid lag Lungs: Clear to auscultation bilaterally, normal respiratory effort CV: regular rate and rhythm, no murmurs, no peripheral edema, pedal pulses 2+ GI: Soft, no masses or hepatosplenomegaly, non-tender to palpation, ND Skin: No rashes, palpation reveals normal turgor Psychiatric: appropriate judgment and insight, oriented to person, place, and time    Lab Results:  Recent Labs    05/27/20 0307 05/28/20 0235  WBC 17.7* 11.7*  HGB 12.9 10.9*  HCT 38.7 33.7*  PLT 358 325   BMET Recent Labs    05/26/20 1745 05/26/20 1745 05/27/20 0307 05/28/20 0235  NA 139  --   --  139  K 3.9  --   --  3.5  CL 97*  --   --  111  CO2 28  --   --  19*  GLUCOSE 128*  --   --  90  BUN 19  --   --  19  CREATININE 0.80   < > 0.83 0.75  CALCIUM 9.9  --   --  7.3*   < > = values in this interval not displayed.   PT/INR No results for input(s): LABPROT, INR in the last 72 hours. ABG No results for input(s): PHART, HCO3 in the last 72 hours.  Invalid input(s): PCO2, PO2  Studies/Results: CT ABDOMEN PELVIS W CONTRAST  Result Date: 05/26/2020 CLINICAL DATA:  79 year old female with bloating x2 months. Nausea and vomiting. Patient is unable to eat or drink. EXAM: CT ABDOMEN AND PELVIS WITH CONTRAST  TECHNIQUE: Multidetector CT imaging of the abdomen and pelvis was performed using the standard protocol following bolus administration of intravenous contrast. CONTRAST:  132mL ISOVUE-300 IOPAMIDOL (ISOVUE-300) INJECTION 61% COMPARISON:  None. FINDINGS: Lower chest: The visualized lung bases are clear. No intra-abdominal free air. There is a small free fluid in the pelvis. Hepatobiliary: Several small hepatic hypodense lesions measure up to 7 mm in the dome of the liver, indeterminate and too small to characterize. The liver is otherwise unremarkable. No intrahepatic biliary ductal dilatation. The gallbladder is unremarkable. Pancreas: Unremarkable. No pancreatic ductal dilatation or surrounding inflammatory changes. Spleen: Normal in size without focal abnormality. Adrenals/Urinary Tract: The adrenal glands unremarkable. The kidneys, visualized ureters, and urinary bladder appear unremarkable. Stomach/Bowel: There is a long segment masslike circumferential thickening of the descending colon with associated high-grade luminal narrowing and resulting colonic obstruction. This measures at least 15 cm in length. There is stranding and nodularity of the surrounding mesocolon. A superimposed acute inflammatory process such as colitis is not excluded. There is apparent focal area of discontinuity of the colonic wall with an ill-defined 8 mm extra colonic collection containing tiny pockets of air (40/2), likely a focally contained microperforation. No drainable fluid collection or abscess. There is distention of the colon proximal to this segment. No evidence of small-bowel dilatation. The appendix  is normal. Vascular/Lymphatic: Moderate aortoiliac atherosclerotic disease. The IVC is unremarkable. No portal venous gas. There is retroperitoneal adenopathy. Reproductive: The uterus is anteverted and grossly unremarkable. Other: None Musculoskeletal: Degenerative changes of the spine. No acute osseous pathology. IMPRESSION:  1. Long segment masslike circumferential thickening of the descending colon most consistent with malignancy. There is associated high-grade luminal narrowing/stricture and resulting colonic obstruction. 2. Small focal area of colonic wall defect in the medial mid descending colon with evidence of focally contained microperforation. No drainable fluid collection or abscess. 3. Retroperitoneal adenopathy. 4. Small hepatic hypodense lesions, indeterminate and too small to characterize. 5. Aortic Atherosclerosis (ICD10-I70.0). These results were called by telephone at the time of interpretation on 05/26/2020 at 8:45 pm to provider Dr. Antony Haste, who verbally acknowledged these results. Electronically Signed   By: Anner Crete M.D.   On: 05/26/2020 20:46   DG CHEST PORT 1 VIEW  Result Date: 05/27/2020 CLINICAL DATA:  Preoperative evaluation EXAM: PORTABLE CHEST 1 VIEW COMPARISON:  None. FINDINGS: Mild interstitial prominence is probably chronic. No consolidation or edema. No pleural effusion. Cardiomediastinal contours are within normal limits with normal heart size. IMPRESSION: No acute process in the chest. Electronically Signed   By: Macy Mis M.D.   On: 05/27/2020 09:56   ECHOCARDIOGRAM COMPLETE  Result Date: 05/27/2020    ECHOCARDIOGRAM REPORT   Patient Name:   Kathy Robinson Date of Exam: 05/27/2020 Medical Rec #:  [id number]           Height:       66.0 in Accession #:    [id number]          Weight:       144.0 lb Date of Birth:  1941-04-28            BSA:          1.739 m Patient Age:    79 years            BP:           130/54 mmHg Patient Gender: F                   HR:           73 bpm. Exam Location:  Inpatient Procedure: 2D Echo Indications:    atrial fibrillation  History:        Patient has no prior history of Echocardiogram examinations.                 Risk Factors:Dyslipidemia. Cancer.  Sonographer:    Jannett Celestine RDCS (AE) Referring Phys: [id number] [place]  1. Left  ventricular ejection fraction, by estimation, is 55 to 60%. The left ventricle has normal function. The left ventricle has no regional wall motion abnormalities. Left ventricular diastolic parameters were normal.  2. Right ventricular systolic function is normal. The right ventricular size is mildly enlarged.  3. Right atrial size was mildly dilated.  4. The mitral valve is normal in structure. No evidence of mitral valve regurgitation.  5. The aortic valve is grossly normal. Aortic valve regurgitation is not visualized. FINDINGS  Left Ventricle: Left ventricular ejection fraction, by estimation, is 55 to 60%. The left ventricle has normal function. The left ventricle has no regional wall motion abnormalities. The left ventricular internal cavity size was normal in size. There is  no left ventricular hypertrophy. Left ventricular diastolic parameters were normal. Right Ventricle: The right ventricular size is mildly enlarged. No increase in right  ventricular wall thickness. Right ventricular systolic function is normal. Left Atrium: Left atrial size was normal in size. Right Atrium: Right atrial size was mildly dilated. Pericardium: There is no evidence of pericardial effusion. Mitral Valve: The mitral valve is normal in structure. No evidence of mitral valve regurgitation. Tricuspid Valve: The tricuspid valve is grossly normal. Tricuspid valve regurgitation is mild. Aortic Valve: The aortic valve is grossly normal. Aortic valve regurgitation is not visualized. Pulmonic Valve: The pulmonic valve was not well visualized. Pulmonic valve regurgitation is not visualized. Aorta: The aortic root is normal in size and structure. IAS/Shunts: No atrial level shunt detected by color flow Doppler.  LEFT VENTRICLE PLAX 2D LVIDd:         4.50 cm  Diastology LVIDs:         3.20 cm  LV e' lateral:   8.81 cm/s LV PW:         0.80 cm  LV E/e' lateral: 10.4 LV IVS:        0.80 cm  LV e' medial:    10.60 cm/s LVOT diam:     1.90 cm   LV E/e' medial:  8.7 LV SV:         35 LV SV Index:   20 LVOT Area:     2.84 cm  RIGHT VENTRICLE TAPSE (M-mode): 1.8 cm LEFT ATRIUM           Index      RIGHT ATRIUM           Index LA diam:      3.50 cm 2.01 cm/m RA Area:     16.10 cm LA Vol (A4C): 17.2 ml 9.89 ml/m RA Volume:   36.50 ml  20.99 ml/m  AORTIC VALVE LVOT Vmax:   69.00 cm/s LVOT Vmean:  46.800 cm/s LVOT VTI:    0.125 m  AORTA Ao Root diam: 3.00 cm MITRAL VALVE MV Area (PHT): 2.22 cm    SHUNTS MV Decel Time: 341 msec    Systemic VTI:  0.12 m MV E velocity: 91.80 cm/s  Systemic Diam: 1.90 cm Dorris Carnes MD Electronically signed by Dorris Carnes MD Signature Date/Time: 05/27/2020/6:15:21 PM    Final     Anti-infectives: Anti-infectives (From admission, onward)   Start     Dose/Rate Route Frequency Ordered Stop   05/27/20 0100  cefoTEtan (CEFOTAN) 2 g in sodium chloride 0.9 % 100 mL IVPB     Discontinue     2 g 200 mL/hr over 30 Minutes Intravenous Every 12 hours 05/26/20 2354        Assessment/Plan: 57 F with L colon mass  L Colon obstruction -con't NPO for now, some clears OK -Will slowly prep through weekend since having some BMs with plan for surgery likely Monday  FEN - NPO/IVFs/cardizem gtt VTE - lovenox 30mg  daily ID - cefotetan, WBC trending down   LOS: 2 days    Ralene Ok 05/28/2020

## 2020-05-28 NOTE — Progress Notes (Addendum)
PROGRESS NOTE  [name]  XKG:[id number] DOB: 15-Sep-1941 DOA: 05/26/2020 PCP: Aretta Nip, MD   Brief Narrative: Kathy Robinson is a healthy 79 y.o. female with presented to the ED with intractable vomiting and colonic mass seen on CT as an outpatient. underwent a CT which revealed a masslike circumferential thickening of the descending colon consistent with malignancy associated high-grade colonic obstruction as well as microperforation but no drainable abscess. There was also retroperitoneal adenopathy. She was admitted by general surgery but developed AFib with RVR for which diltiazem infusion was started and cardiology consulted. Fortunately, she's converted to NSR. Plans are for surgery 7/12.  Assessment & Plan: Principal Problem:   Colonic obstruction (HCC) Active Problems:   Colonic mass   Atrial fibrillation with RVR (HCC)  Colon mass with obstruction and microperforation, retroperitoneal lymphadenopathy:  - Cefotetan to continue, monitoring leukocytosis which is improving.  - Diet per surgery, will keep essentially NPO with water for weekend.  New onset paroxysmal atrial fibrillation with RVR: RVR resolved, back into NSR after discontinuing diltiazem infusion. No significant structural heart disease on echocardiogram.  - Cardiology consulted. No further risk assessment recommended preoperatively.  - Avoiding starting new BB preoperatively, could consider amiodarone if converts back to AFib w/elevated rate.  - Deferring anticoagulation for now.  - TSH in AM - Continue K in IVF with goal around 4.  Troponin elevation: Due to demand myocardial ischemia in setting of RVR. No chest pain.  - Per cardiology, no further work up planned.   Hyperlipidemia: HDL 48, LDL 86. - Continue statin.   Prediabetes: HbA1c 5.8%. - PCP follow up recommended.   DVT prophylaxis: Lovenox, with CrCl >74ml/min, BMI wnl, will increase to 40mg  dosing. Code Status: Full Family  Communication: None at bedside Disposition Plan:  Status is: Inpatient; can transfer to telemetry floor.  Remains inpatient appropriate because:Inpatient level of care appropriate due to severity of illness   Dispo: The patient is from: Home              Anticipated d/c is to: Home              Anticipated d/c date is: > 3 days              Patient currently is not medically stable to d/c.  Consultants:   Surgery  GI  Cardiology  Procedures:   Colectomy planned 7/12  Antimicrobials:  Cefotetan    Subjective: Pain resolved, tolerating water, having loose BMs. No nausea or vomiting. No chest pain, palpitations, or dyspnea.   Objective: Vitals:   05/28/20 0800 05/28/20 1022 05/28/20 1100 05/28/20 1200  BP:  (!) 146/75 (!) 151/68   Pulse: 91 73 74   Resp: (!) 21 (!) 25 15   Temp: 98 F (36.7 C)   98.1 F (36.7 C)  TempSrc: Oral   Oral  SpO2: 94% 96% 98%   Weight:      Height:        Intake/Output Summary (Last 24 hours) at 05/28/2020 1348 Last data filed at 05/27/2020 1800 Gross per 24 hour  Intake 1361.14 ml  Output --  Net 1361.14 ml   Filed Weights   05/26/20 1630 05/26/20 2021 05/27/20 1115  Weight: 63.5 kg 63.5 kg 65.3 kg    Gen: 79 y.o. female in no distress  Pulm: Non-labored breathing room air. Clear to auscultation bilaterally.  CV: Regular rate and rhythm. No murmur, rub, or gallop. No JVD, no pedal edema. GI: Abdomen  soft, very minimally tender to deep palpation in LLQ without palpable mass, non-distended, with normoactive bowel sounds. No organomegaly or masses felt. Ext: Warm, no deformities Skin: No rashes, lesions ulcers Neuro: Alert and oriented. No focal neurological deficits. Psych: Judgement and insight appear normal. Mood & affect appropriate.   Data Reviewed: I have personally reviewed following labs and imaging studies  CBC: Recent Labs  Lab 05/26/20 1745 05/27/20 0307 05/28/20 0235  WBC 12.7* 17.7* 11.7*  HGB 14.7 12.9 10.9*   HCT 43.9 38.7 33.7*  MCV 95.4 96.3 98.0  PLT 428* 358 130   Basic Metabolic Panel: Recent Labs  Lab 05/26/20 1745 05/27/20 0307 05/28/20 0235  NA 139  --  139  K 3.9  --  3.5  CL 97*  --  111  CO2 28  --  19*  GLUCOSE 128*  --  90  BUN 19  --  19  CREATININE 0.80 0.83 0.75  CALCIUM 9.9  --  7.3*   GFR: Estimated Creatinine Clearance: 55.1 mL/min (by C-G formula based on SCr of 0.75 mg/dL). Liver Function Tests: Recent Labs  Lab 05/26/20 1745  AST 22  ALT 16  ALKPHOS 60  BILITOT 0.7  PROT 8.5*  ALBUMIN 4.2   Recent Labs  Lab 05/26/20 1745  LIPASE 18   No results for input(s): AMMONIA in the last 168 hours. Coagulation Profile: No results for input(s): INR, PROTIME in the last 168 hours. Cardiac Enzymes: No results for input(s): CKTOTAL, CKMB, CKMBINDEX, TROPONINI in the last 168 hours. BNP (last 3 results) No results for input(s): PROBNP in the last 8760 hours. HbA1C: Recent Labs    05/27/20 0300  HGBA1C 5.8*   CBG: No results for input(s): GLUCAP in the last 168 hours. Lipid Profile: Recent Labs    05/27/20 0300  CHOL 144  HDL 48  LDLCALC 86  TRIG 52  CHOLHDL 3.0   Thyroid Function Tests: No results for input(s): TSH, T4TOTAL, FREET4, T3FREE, THYROIDAB in the last 72 hours. Anemia Panel: No results for input(s): VITAMINB12, FOLATE, FERRITIN, TIBC, IRON, RETICCTPCT in the last 72 hours. Urine analysis:    Component Value Date/Time   COLORURINE YELLOW 05/26/2020 1635   APPEARANCEUR CLEAR 05/26/2020 1635   LABSPEC 1.025 05/26/2020 1635   PHURINE 5.0 05/26/2020 1635   GLUCOSEU NEGATIVE 05/26/2020 1635   HGBUR MODERATE (A) 05/26/2020 1635   BILIRUBINUR NEGATIVE 05/26/2020 1635   KETONESUR 80 (A) 05/26/2020 1635   PROTEINUR 30 (A) 05/26/2020 1635   NITRITE NEGATIVE 05/26/2020 1635   LEUKOCYTESUR NEGATIVE 05/26/2020 1635   Recent Results (from the past 240 hour(s))  SARS Coronavirus 2 by RT PCR (hospital order, performed in [facility]  hospital lab) Nasopharyngeal Nasopharyngeal Swab     Status: None   Collection Time: 05/26/20  9:01 PM   Specimen: Nasopharyngeal Swab  Result Value Ref Range Status   SARS Coronavirus 2 NEGATIVE NEGATIVE Final    Comment: (NOTE) SARS-CoV-2 target nucleic acids are NOT DETECTED.  The SARS-CoV-2 RNA is generally detectable in upper and lower respiratory specimens during the acute phase of infection. The lowest concentration of SARS-CoV-2 viral copies this assay can detect is 250 copies / mL. A negative result does not preclude SARS-CoV-2 infection and should not be used as the sole basis for treatment or other patient management decisions.  A negative result may occur with improper specimen collection / handling, submission of specimen other than nasopharyngeal swab, presence of viral mutation(s) within the areas targeted by this  assay, and inadequate number of viral copies (<250 copies / mL). A negative result must be combined with clinical observations, patient history, and epidemiological information.  Fact Sheet for Patients:   StrictlyIdeas.no  Fact Sheet for Healthcare Providers: BankingDealers.co.za  This test is not yet approved or  cleared by the [place] FDA and has been authorized for detection and/or diagnosis of SARS-CoV-2 by FDA under an Emergency Use Authorization (EUA).  This EUA will remain in effect (meaning this test can be used) for the duration of the COVID-19 declaration under Section 564(b)(1) of the Act, 21 U.S.C. section 360bbb-3(b)(1), unless the authorization is terminated or revoked sooner.  Performed at [facility], [place] [address] [place], [place] [address]   MRSA PCR Screening     Status: None   Collection Time: 05/27/20 11:46 AM   Specimen: Nasal Mucosa; Nasopharyngeal  Result Value Ref Range Status   MRSA by PCR NEGATIVE NEGATIVE Final    Comment:        The GeneXpert MRSA  Assay (FDA approved for NASAL specimens only), is one component of a comprehensive MRSA colonization surveillance program. It is not intended to diagnose MRSA infection nor to guide or monitor treatment for MRSA infections. Performed at [facility], [place] [address] [place], [place] [address]       Radiology Studies: DG CHEST PORT 1 VIEW  Result Date: 05/27/2020 CLINICAL DATA:  Preoperative evaluation EXAM: PORTABLE CHEST 1 VIEW COMPARISON:  None. FINDINGS: Mild interstitial prominence is probably chronic. No consolidation or edema. No pleural effusion. Cardiomediastinal contours are within normal limits with normal heart size. IMPRESSION: No acute process in the chest. Electronically Signed   By: Macy Mis M.D.   On: 05/27/2020 09:56   ECHOCARDIOGRAM COMPLETE  Result Date: 05/27/2020    ECHOCARDIOGRAM REPORT   Patient Name:   ANTARA BRECHEISEN Date of Exam: 05/27/2020 Medical Rec #:  [id number]           Height:       66.0 in Accession #:    [id number]          Weight:       144.0 lb Date of Birth:  [birth year]            BSA:          1.739 m Patient Age:    64 years            BP:           130/54 mmHg Patient Gender: F                   HR:           73 bpm. Exam Location:  Inpatient Procedure: 2D Echo Indications:    atrial fibrillation  History:        Patient has no prior history of Echocardiogram examinations.                 Risk Factors:Dyslipidemia. Cancer.  Sonographer:    Jannett Celestine RDCS (AE) Referring Phys: [id number] [place]  1. Left ventricular ejection fraction, by estimation, is 55 to 60%. The left ventricle has normal function. The left ventricle has no regional wall motion abnormalities. Left ventricular diastolic parameters were normal.  2. Right ventricular systolic function is normal. The right ventricular size is mildly enlarged.  3. Right atrial size was mildly dilated.  4. The mitral valve is normal in structure. No  evidence  of mitral valve regurgitation.  5. The aortic valve is grossly normal. Aortic valve regurgitation is not visualized. FINDINGS  Left Ventricle: Left ventricular ejection fraction, by estimation, is 55 to 60%. The left ventricle has normal function. The left ventricle has no regional wall motion abnormalities. The left ventricular internal cavity size was normal in size. There is  no left ventricular hypertrophy. Left ventricular diastolic parameters were normal. Right Ventricle: The right ventricular size is mildly enlarged. No increase in right ventricular wall thickness. Right ventricular systolic function is normal. Left Atrium: Left atrial size was normal in size. Right Atrium: Right atrial size was mildly dilated. Pericardium: There is no evidence of pericardial effusion. Mitral Valve: The mitral valve is normal in structure. No evidence of mitral valve regurgitation. Tricuspid Valve: The tricuspid valve is grossly normal. Tricuspid valve regurgitation is mild. Aortic Valve: The aortic valve is grossly normal. Aortic valve regurgitation is not visualized. Pulmonic Valve: The pulmonic valve was not well visualized. Pulmonic valve regurgitation is not visualized. Aorta: The aortic root is normal in size and structure. IAS/Shunts: No atrial level shunt detected by color flow Doppler.  LEFT VENTRICLE PLAX 2D LVIDd:         4.50 cm  Diastology LVIDs:         3.20 cm  LV e' lateral:   8.81 cm/s LV PW:         0.80 cm  LV E/e' lateral: 10.4 LV IVS:        0.80 cm  LV e' medial:    10.60 cm/s LVOT diam:     1.90 cm  LV E/e' medial:  8.7 LV SV:         35 LV SV Index:   20 LVOT Area:     2.84 cm  RIGHT VENTRICLE TAPSE (M-mode): 1.8 cm LEFT ATRIUM           Index      RIGHT ATRIUM           Index LA diam:      3.50 cm 2.01 cm/m RA Area:     16.10 cm LA Vol (A4C): 17.2 ml 9.89 ml/m RA Volume:   36.50 ml  20.99 ml/m  AORTIC VALVE LVOT Vmax:   69.00 cm/s LVOT Vmean:  46.800 cm/s LVOT VTI:    0.125 m  AORTA Ao  Root diam: 3.00 cm MITRAL VALVE MV Area (PHT): 2.22 cm    SHUNTS MV Decel Time: 341 msec    Systemic VTI:  0.12 m MV E velocity: 91.80 cm/s  Systemic Diam: 1.90 cm Dorris Carnes MD Electronically signed by Dorris Carnes MD Signature Date/Time: 05/27/2020/6:15:21 PM    Final     Scheduled Meds:  Chlorhexidine Gluconate Cloth  6 each Topical Daily   enoxaparin (LOVENOX) injection  30 mg Subcutaneous Q24H   polyethylene glycol powder  0.5 Container Oral Daily   pravastatin  20 mg Oral Daily   Continuous Infusions:  cefoTEtan (CEFOTAN) IV 2 g (05/28/20 1338)   dextrose 5 % and 0.45 % NaCl with KCl 20 mEq/L 100 mL/hr at 05/28/20 1026     LOS: 2 days   Time spent: 25 minutes.  Patrecia Pour, MD [facility] [email or web address] 05/28/2020, 1:48 PM

## 2020-05-28 NOTE — Progress Notes (Signed)
Cardiology Progress Note  Patient ID: Kathy Robinson MRN: [id number] DOB: 1941-11-09 Date of Encounter: 05/28/2020  Primary Cardiologist: No primary care provider on file.  Subjective   Chief Complaint: atrial fibrillation  HPI: Converted back to NSR. Anxious about surgery.   ROS:  All other ROS reviewed and negative. Pertinent positives noted in the HPI.     Inpatient Medications  Scheduled Meds: . amiodarone  400 mg Oral BID   Followed by  . [START ON 06/04/2020] amiodarone  200 mg Oral Daily  . Chlorhexidine Gluconate Cloth  6 each Topical Daily  . enoxaparin (LOVENOX) injection  30 mg Subcutaneous Q24H  . pravastatin  20 mg Oral Daily  . sodium chloride flush  3 mL Intravenous Once   Continuous Infusions: . sodium chloride 150 mL/hr at 05/27/20 1800  . cefoTEtan (CEFOTAN) IV Stopped (05/28/20 0105)  . dextrose 5 % and 0.45 % NaCl with KCl 20 mEq/L Stopped (05/27/20 0146)  . diltiazem (CARDIZEM) infusion Stopped (05/28/20 0216)   PRN Meds: acetaminophen **OR** acetaminophen, HYDROmorphone (DILAUDID) injection, ondansetron **OR** ondansetron (ZOFRAN) IV, oxyCODONE, traMADol   Vital Signs   Vitals:   05/28/20 0300 05/28/20 0400 05/28/20 0500 05/28/20 0600  BP: (!) 132/56 (!) 108/57 (!) 134/58 (!) 112/54  Pulse: 62 (!) 59 (!) 55 (!) 58  Resp: 18 14 15 16   Temp:  98.1 F (36.7 C)    TempSrc:  Oral    SpO2: 93% 92% 91% 92%  Weight:      Height:        Intake/Output Summary (Last 24 hours) at 05/28/2020 0812 Last data filed at 05/27/2020 1800 Gross per 24 hour  Intake 1361.14 ml  Output --  Net 1361.14 ml   Last 3 Weights 05/27/2020 05/26/2020 05/26/2020  Weight (lbs) 143 lb 15.4 oz 140 lb 140 lb  Weight (kg) 65.3 kg 63.504 kg 63.504 kg      Telemetry  Overnight telemetry shows NSR 70 bpm, which I personally reviewed.   ECG  ECG this AM pending. Done in room and shows NSR 70 bpm.   Physical Exam   Vitals:   05/28/20 0300 05/28/20 0400 05/28/20 0500  05/28/20 0600  BP: (!) 132/56 (!) 108/57 (!) 134/58 (!) 112/54  Pulse: 62 (!) 59 (!) 55 (!) 58  Resp: 18 14 15 16   Temp:  98.1 F (36.7 C)    TempSrc:  Oral    SpO2: 93% 92% 91% 92%  Weight:      Height:         Intake/Output Summary (Last 24 hours) at 05/28/2020 0812 Last data filed at 05/27/2020 1800 Gross per 24 hour  Intake 1361.14 ml  Output --  Net 1361.14 ml    Last 3 Weights 05/27/2020 05/26/2020 05/26/2020  Weight (lbs) 143 lb 15.4 oz 140 lb 140 lb  Weight (kg) 65.3 kg 63.504 kg 63.504 kg    Body mass index is 23.24 kg/m.  General: Well nourished, well developed, in no acute distress Head: Atraumatic, normal size  Eyes: PEERLA, EOMI  Neck: Supple, no JVD Endocrine: No thryomegaly Cardiac: Normal S1, S2; RRR; no murmurs, rubs, or gallops Lungs: Clear to auscultation bilaterally, no wheezing, rhonchi or rales  Abd: Soft, nontender, no hepatomegaly  Ext: No edema, pulses 2+ Musculoskeletal: No deformities, BUE and BLE strength normal and equal Skin: Warm and dry, no rashes   Neuro: Alert and oriented to person, place, time, and situation, CNII-XII grossly intact, no focal deficits  Psych: Normal mood  and affect   Labs  High Sensitivity Troponin:   Recent Labs  Lab 05/27/20 0950 05/27/20 1122  TROPONINIHS 69* 86*     Cardiac EnzymesNo results for input(s): TROPONINI in the last 168 hours. No results for input(s): TROPIPOC in the last 168 hours.  Chemistry Recent Labs  Lab 05/26/20 1745 05/27/20 0307 05/28/20 0235  NA 139  --  139  K 3.9  --  3.5  CL 97*  --  111  CO2 28  --  19*  GLUCOSE 128*  --  90  BUN 19  --  19  CREATININE 0.80 0.83 0.75  CALCIUM 9.9  --  7.3*  PROT 8.5*  --   --   ALBUMIN 4.2  --   --   AST 22  --   --   ALT 16  --   --   ALKPHOS 60  --   --   BILITOT 0.7  --   --   GFRNONAA >60 >60 >60  GFRAA >60 >60 >60  ANIONGAP 14  --  9    Hematology Recent Labs  Lab 05/26/20 1745 05/27/20 0307 05/28/20 0235  WBC 12.7* 17.7* 11.7*   RBC 4.60 4.02 3.44*  HGB 14.7 12.9 10.9*  HCT 43.9 38.7 33.7*  MCV 95.4 96.3 98.0  MCH 32.0 32.1 31.7  MCHC 33.5 33.3 32.3  RDW 12.4 12.6 13.2  PLT 428* 358 325   BNPNo results for input(s): BNP, PROBNP in the last 168 hours.  DDimer No results for input(s): DDIMER in the last 168 hours.   Radiology  CT ABDOMEN PELVIS W CONTRAST  Result Date: 05/26/2020 CLINICAL DATA:  79 year old female with bloating x2 months. Nausea and vomiting. Patient is unable to eat or drink. EXAM: CT ABDOMEN AND PELVIS WITH CONTRAST TECHNIQUE: Multidetector CT imaging of the abdomen and pelvis was performed using the standard protocol following bolus administration of intravenous contrast. CONTRAST:  127mL ISOVUE-300 IOPAMIDOL (ISOVUE-300) INJECTION 61% COMPARISON:  None. FINDINGS: Lower chest: The visualized lung bases are clear. No intra-abdominal free air. There is a small free fluid in the pelvis. Hepatobiliary: Several small hepatic hypodense lesions measure up to 7 mm in the dome of the liver, indeterminate and too small to characterize. The liver is otherwise unremarkable. No intrahepatic biliary ductal dilatation. The gallbladder is unremarkable. Pancreas: Unremarkable. No pancreatic ductal dilatation or surrounding inflammatory changes. Spleen: Normal in size without focal abnormality. Adrenals/Urinary Tract: The adrenal glands unremarkable. The kidneys, visualized ureters, and urinary bladder appear unremarkable. Stomach/Bowel: There is a long segment masslike circumferential thickening of the descending colon with associated high-grade luminal narrowing and resulting colonic obstruction. This measures at least 15 cm in length. There is stranding and nodularity of the surrounding mesocolon. A superimposed acute inflammatory process such as colitis is not excluded. There is apparent focal area of discontinuity of the colonic wall with an ill-defined 8 mm extra colonic collection containing tiny pockets of air  (40/2), likely a focally contained microperforation. No drainable fluid collection or abscess. There is distention of the colon proximal to this segment. No evidence of small-bowel dilatation. The appendix is normal. Vascular/Lymphatic: Moderate aortoiliac atherosclerotic disease. The IVC is unremarkable. No portal venous gas. There is retroperitoneal adenopathy. Reproductive: The uterus is anteverted and grossly unremarkable. Other: None Musculoskeletal: Degenerative changes of the spine. No acute osseous pathology. IMPRESSION: 1. Long segment masslike circumferential thickening of the descending colon most consistent with malignancy. There is associated high-grade luminal narrowing/stricture and resulting colonic obstruction.  2. Small focal area of colonic wall defect in the medial mid descending colon with evidence of focally contained microperforation. No drainable fluid collection or abscess. 3. Retroperitoneal adenopathy. 4. Small hepatic hypodense lesions, indeterminate and too small to characterize. 5. Aortic Atherosclerosis (ICD10-I70.0). These results were called by telephone at the time of interpretation on 05/26/2020 at 8:45 pm to provider Dr. Antony Haste, who verbally acknowledged these results. Electronically Signed   By: Anner Crete M.D.   On: 05/26/2020 20:46   DG CHEST PORT 1 VIEW  Result Date: 05/27/2020 CLINICAL DATA:  Preoperative evaluation EXAM: PORTABLE CHEST 1 VIEW COMPARISON:  None. FINDINGS: Mild interstitial prominence is probably chronic. No consolidation or edema. No pleural effusion. Cardiomediastinal contours are within normal limits with normal heart size. IMPRESSION: No acute process in the chest. Electronically Signed   By: Macy Mis M.D.   On: 05/27/2020 09:56   ECHOCARDIOGRAM COMPLETE  Result Date: 05/27/2020    ECHOCARDIOGRAM REPORT   Patient Name:   Kathy Robinson Date of Exam: 05/27/2020 Medical Rec #:  [id number]           Height:       66.0 in Accession #:     [id number]          Weight:       144.0 lb Date of Birth:  07-11-1941            BSA:          1.739 m Patient Age:    26 years            BP:           130/54 mmHg Patient Gender: F                   HR:           73 bpm. Exam Location:  Inpatient Procedure: 2D Echo Indications:    atrial fibrillation  History:        Patient has no prior history of Echocardiogram examinations.                 Risk Factors:Dyslipidemia. Cancer.  Sonographer:    Jannett Celestine RDCS (AE) Referring Phys: [id number] [place]  1. Left ventricular ejection fraction, by estimation, is 55 to 60%. The left ventricle has normal function. The left ventricle has no regional wall motion abnormalities. Left ventricular diastolic parameters were normal.  2. Right ventricular systolic function is normal. The right ventricular size is mildly enlarged.  3. Right atrial size was mildly dilated.  4. The mitral valve is normal in structure. No evidence of mitral valve regurgitation.  5. The aortic valve is grossly normal. Aortic valve regurgitation is not visualized. FINDINGS  Left Ventricle: Left ventricular ejection fraction, by estimation, is 55 to 60%. The left ventricle has normal function. The left ventricle has no regional wall motion abnormalities. The left ventricular internal cavity size was normal in size. There is  no left ventricular hypertrophy. Left ventricular diastolic parameters were normal. Right Ventricle: The right ventricular size is mildly enlarged. No increase in right ventricular wall thickness. Right ventricular systolic function is normal. Left Atrium: Left atrial size was normal in size. Right Atrium: Right atrial size was mildly dilated. Pericardium: There is no evidence of pericardial effusion. Mitral Valve: The mitral valve is normal in structure. No evidence of mitral valve regurgitation. Tricuspid Valve: The tricuspid valve is grossly normal. Tricuspid valve regurgitation is mild. Aortic Valve:  The aortic valve is grossly normal. Aortic valve regurgitation is not visualized. Pulmonic Valve: The pulmonic valve was not well visualized. Pulmonic valve regurgitation is not visualized. Aorta: The aortic root is normal in size and structure. IAS/Shunts: No atrial level shunt detected by color flow Doppler.  LEFT VENTRICLE PLAX 2D LVIDd:         4.50 cm  Diastology LVIDs:         3.20 cm  LV e' lateral:   8.81 cm/s LV PW:         0.80 cm  LV E/e' lateral: 10.4 LV IVS:        0.80 cm  LV e' medial:    10.60 cm/s LVOT diam:     1.90 cm  LV E/e' medial:  8.7 LV SV:         35 LV SV Index:   20 LVOT Area:     2.84 cm  RIGHT VENTRICLE TAPSE (M-mode): 1.8 cm LEFT ATRIUM           Index      RIGHT ATRIUM           Index LA diam:      3.50 cm 2.01 cm/m RA Area:     16.10 cm LA Vol (A4C): 17.2 ml 9.89 ml/m RA Volume:   36.50 ml  20.99 ml/m  AORTIC VALVE LVOT Vmax:   69.00 cm/s LVOT Vmean:  46.800 cm/s LVOT VTI:    0.125 m  AORTA Ao Root diam: 3.00 cm MITRAL VALVE MV Area (PHT): 2.22 cm    SHUNTS MV Decel Time: 341 msec    Systemic VTI:  0.12 m MV E velocity: 91.80 cm/s  Systemic Diam: 1.90 cm Dorris Carnes MD Electronically signed by Dorris Carnes MD Signature Date/Time: 05/27/2020/6:15:21 PM    Final     Cardiac Studies   TTE 05/27/2020 1. Left ventricular ejection fraction, by estimation, is 55 to 60%. The  left ventricle has normal function. The left ventricle has no regional  wall motion abnormalities. Left ventricular diastolic parameters were  normal.  2. Right ventricular systolic function is normal. The right ventricular  size is mildly enlarged.  3. Right atrial size was mildly dilated.  4. The mitral valve is normal in structure. No evidence of mitral valve  regurgitation.  5. The aortic valve is grossly normal. Aortic valve regurgitation is not  visualized.   Patient Profile  Kathy Robinson is a 79 y.o. female with HLD admitted with colonic mass 05/27/2020. Developed Atrial fibrillation  and cardiology consulted.   Assessment & Plan   New Onset Atrial Fibrillation: Suspect 2/2 new colonic mass and obstruction. TSH pending. Converted back to NSR on diltiazem drip. CHADSVASC = 3. TSH pending. Echo shows normal LVEF. Cardiology yesterday did not recommend anti-arrhythmics. Will stop diltiazem drip. Would consider short course of amiodarone if arrhythmias recur. We can hold on this for now. Would start eliquis 5 mg BID after surgery and once cleared by surgery. We will have her see Dr. Sallyanne Kuster in follow-up. Please notify cardiology if her arrhythmia recurs.   [facility] will sign off.   Medication Recommendations:  Stop diltiazem, eliquis 5 mg BID after surgery once cleared by surgery  Other recommendations (labs, testing, etc):  none Follow up as an outpatient:  Notify cardiology on call once closer to DC so we can arrange hospital follow-up  Time Spent with Patient: I have spent a total of 25 minutes with patient reviewing hospital notes,  telemetry, EKGs, labs and examining the patient as well as establishing an assessment and plan that was discussed with the patient.  > 50% of time was spent in direct patient care.    Signed, Addison Naegeli. Audie Box, [place]  05/28/2020 8:12 AM   For questions or updates, please contact [place] Please consult [email or web address] for contact info under

## 2020-05-29 LAB — BASIC METABOLIC PANEL
Anion gap: 8 (ref 5–15)
BUN: 8 mg/dL (ref 8–23)
CO2: 22 mmol/L (ref 22–32)
Calcium: 7.6 mg/dL — ABNORMAL LOW (ref 8.9–10.3)
Chloride: 107 mmol/L (ref 98–111)
Creatinine, Ser: 0.72 mg/dL (ref 0.44–1.00)
GFR calc Af Amer: >60 mL/min (ref >60)
GFR calc non Af Amer: >60 mL/min (ref >60)
Glucose, Bld: 127 mg/dL — ABNORMAL HIGH (ref 70–99)
Potassium: 3.2 mmol/L — ABNORMAL LOW (ref 3.5–5.1)
Sodium: 137 mmol/L (ref 135–145)

## 2020-05-29 LAB — MAGNESIUM: Magnesium: 1.9 mg/dL (ref 1.7–2.4)

## 2020-05-29 LAB — CBC
HCT: 33.5 % — ABNORMAL LOW (ref 36.0–46.0)
Hemoglobin: 11.2 g/dL — ABNORMAL LOW (ref 12.0–15.0)
MCH: 31.6 pg (ref 26.0–34.0)
MCHC: 33.4 g/dL (ref 30.0–36.0)
MCV: 94.6 fL (ref 80.0–100.0)
Platelets: 306 10*3/uL (ref 150–400)
RBC: 3.54 MIL/uL — ABNORMAL LOW (ref 3.87–5.11)
RDW: 12.9 % (ref 11.5–15.5)
WBC: 8.8 10*3/uL (ref 4.0–10.5)
nRBC: 0 % (ref 0.0–0.2)

## 2020-05-29 LAB — TSH: TSH: 4.177 u[IU]/mL (ref 0.350–4.500)

## 2020-05-29 MED ORDER — MAGNESIUM SULFATE IN D5W 1-5 GM/100ML-% IV SOLN
1.0000 g | Freq: Once | INTRAVENOUS | Status: AC
  Administered 2020-05-29: 1 g via INTRAVENOUS
  Filled 2020-05-29: qty 100

## 2020-05-29 MED ORDER — KCL IN DEXTROSE-NACL 40-5-0.45 MEQ/L-%-% IV SOLN
INTRAVENOUS | Status: DC
  Filled 2020-05-29 (×5): qty 1000

## 2020-05-29 MED ORDER — MELATONIN 3 MG PO TABS
3.0000 mg | ORAL_TABLET | Freq: Every day | ORAL | Status: DC
  Administered 2020-05-29 – 2020-06-02 (×5): 3 mg via ORAL
  Filled 2020-05-29 (×5): qty 1

## 2020-05-29 NOTE — Plan of Care (Signed)

## 2020-05-29 NOTE — Progress Notes (Signed)
Subjective/Chief Complaint: No complaints Had good results with the prep Denies abdominal pain or nausea   Objective: Vital signs in last 24 hours: Temp:  [98 F (36.7 C)-98.6 F (37 C)] 98.6 F (37 C) (07/11 0537) Pulse Rate:  [62-91] 71 (07/11 0537) Resp:  [15-25] 20 (07/11 0537) BP: (119-153)/(66-86) 133/79 (07/11 0537) SpO2:  [94 %-100 %] 96 % (07/11 0537) Last BM Date: 05/28/20  Intake/Output from previous day: 07/10 0701 - 07/11 0700 In: 1854.8 [I.V.:1854.8] Out: -  Intake/Output this shift: No intake/output data recorded.  Exam: Awake and alert Abdomen soft, non-distended, non-tender  Lab Results:  Recent Labs    05/28/20 0235 05/29/20 0323  WBC 11.7* 8.8  HGB 10.9* 11.2*  HCT 33.7* 33.5*  PLT 325 306   BMET Recent Labs    05/28/20 0235 05/29/20 0323  NA 139 137  K 3.5 3.2*  CL 111 107  CO2 19* 22  GLUCOSE 90 127*  BUN 19 8  CREATININE 0.75 0.72  CALCIUM 7.3* 7.6*   PT/INR No results for input(s): LABPROT, INR in the last 72 hours. ABG No results for input(s): PHART, HCO3 in the last 72 hours.  Invalid input(s): PCO2, PO2  Studies/Results: DG CHEST PORT 1 VIEW  Result Date: 05/27/2020 CLINICAL DATA:  Preoperative evaluation EXAM: PORTABLE CHEST 1 VIEW COMPARISON:  None. FINDINGS: Mild interstitial prominence is probably chronic. No consolidation or edema. No pleural effusion. Cardiomediastinal contours are within normal limits with normal heart size. IMPRESSION: No acute process in the chest. Electronically Signed   By: Macy Mis M.D.   On: 05/27/2020 09:56   ECHOCARDIOGRAM COMPLETE  Result Date: 05/27/2020    ECHOCARDIOGRAM REPORT   Patient Name:   Kathy Robinson Date of Exam: 05/27/2020 Medical Rec #:  [id number]           Height:       66.0 in Accession #:    [id number]          Weight:       144.0 lb Date of Birth:  07/05/1941            BSA:          1.739 m Patient Age:    79 years            BP:           130/54 mmHg Patient  Gender: F                   HR:           73 bpm. Exam Location:  Inpatient Procedure: 2D Echo Indications:    atrial fibrillation  History:        Patient has no prior history of Echocardiogram examinations.                 Risk Factors:Dyslipidemia. Cancer.  Sonographer:    Jannett Celestine RDCS (AE) Referring Phys: [id number] [place]  1. Left ventricular ejection fraction, by estimation, is 55 to 60%. The left ventricle has normal function. The left ventricle has no regional wall motion abnormalities. Left ventricular diastolic parameters were normal.  2. Right ventricular systolic function is normal. The right ventricular size is mildly enlarged.  3. Right atrial size was mildly dilated.  4. The mitral valve is normal in structure. No evidence of mitral valve regurgitation.  5. The aortic valve is grossly normal. Aortic valve regurgitation is not visualized. FINDINGS  Left Ventricle: Left ventricular  ejection fraction, by estimation, is 55 to 60%. The left ventricle has normal function. The left ventricle has no regional wall motion abnormalities. The left ventricular internal cavity size was normal in size. There is  no left ventricular hypertrophy. Left ventricular diastolic parameters were normal. Right Ventricle: The right ventricular size is mildly enlarged. No increase in right ventricular wall thickness. Right ventricular systolic function is normal. Left Atrium: Left atrial size was normal in size. Right Atrium: Right atrial size was mildly dilated. Pericardium: There is no evidence of pericardial effusion. Mitral Valve: The mitral valve is normal in structure. No evidence of mitral valve regurgitation. Tricuspid Valve: The tricuspid valve is grossly normal. Tricuspid valve regurgitation is mild. Aortic Valve: The aortic valve is grossly normal. Aortic valve regurgitation is not visualized. Pulmonic Valve: The pulmonic valve was not well visualized. Pulmonic valve regurgitation is  not visualized. Aorta: The aortic root is normal in size and structure. IAS/Shunts: No atrial level shunt detected by color flow Doppler.  LEFT VENTRICLE PLAX 2D LVIDd:         4.50 cm  Diastology LVIDs:         3.20 cm  LV e' lateral:   8.81 cm/s LV PW:         0.80 cm  LV E/e' lateral: 10.4 LV IVS:        0.80 cm  LV e' medial:    10.60 cm/s LVOT diam:     1.90 cm  LV E/e' medial:  8.7 LV SV:         35 LV SV Index:   20 LVOT Area:     2.84 cm  RIGHT VENTRICLE TAPSE (M-mode): 1.8 cm LEFT ATRIUM           Index      RIGHT ATRIUM           Index LA diam:      3.50 cm 2.01 cm/m RA Area:     16.10 cm LA Vol (A4C): 17.2 ml 9.89 ml/m RA Volume:   36.50 ml  20.99 ml/m  AORTIC VALVE LVOT Vmax:   69.00 cm/s LVOT Vmean:  46.800 cm/s LVOT VTI:    0.125 m  AORTA Ao Root diam: 3.00 cm MITRAL VALVE MV Area (PHT): 2.22 cm    SHUNTS MV Decel Time: 341 msec    Systemic VTI:  0.12 m MV E velocity: 91.80 cm/s  Systemic Diam: 1.90 cm Dorris Carnes MD Electronically signed by Dorris Carnes MD Signature Date/Time: 05/27/2020/6:15:21 PM    Final     Anti-infectives: Anti-infectives (From admission, onward)   Start     Dose/Rate Route Frequency Ordered Stop   05/27/20 0100  cefoTEtan (CEFOTAN) 2 g in sodium chloride 0.9 % 100 mL IVPB     Discontinue     2 g 200 mL/hr over 30 Minutes Intravenous Every 12 hours 05/26/20 2354        Assessment/Plan: 79 F with L colon mass  L Colon obstruction  second 1/2 of bowel prep today  Likely surgery Monday  WBC normal   LOS: 3 days    Coralie Keens 05/29/2020

## 2020-05-29 NOTE — Progress Notes (Signed)
PROGRESS NOTE  [name]  GTX:[id number] DOB: [birth year] DOA: 05/26/2020 PCP: Aretta Nip, MD   Brief Narrative: Kathy Robinson is a healthy 79 y.o. female with presented to the ED with intractable vomiting and colonic mass seen on CT as an outpatient. underwent a CT which revealed a masslike circumferential thickening of the descending colon consistent with malignancy associated high-grade colonic obstruction as well as microperforation but no drainable abscess. There was also retroperitoneal adenopathy. She was admitted by general surgery but developed AFib with RVR for which diltiazem infusion was started and cardiology consulted. Fortunately, she's converted to NSR. Plans are for surgery 7/12.  Assessment & Plan: Principal Problem:   Colonic obstruction (HCC) Active Problems:   Colonic mass   Atrial fibrillation with RVR (HCC)  Colon mass with obstruction and microperforation, retroperitoneal lymphadenopathy:  - Cefotetan to continue, leukocytosis resolved. - Diet per surgery, bowel clean out continuing. - CEA 13.4. Surgery reportedly planned 7/12  New onset paroxysmal atrial fibrillation with RVR: RVR resolved, back into NSR after discontinuing diltiazem infusion. No significant structural heart disease on echocardiogram. TSH 4.177.  - Cardiology consulted. No further risk assessment recommended preoperatively.  - Durably converted to sinus rhythm. Avoiding starting new BB preoperatively, could consider amiodarone if converts back to AFib w/elevated rate.  - Deferring anticoagulation for now.   Hypokalemia:  - Continue K in IVF with goal around 4. Augmented today, Mg goal 2, giving 1g today.   Troponin elevation: Due to demand myocardial ischemia in setting of RVR. No chest pain.  - Per cardiology, no further work up planned.   Hyperlipidemia: HDL 48, LDL 86. - Continue statin.   Prediabetes: HbA1c 5.8%. - PCP follow up recommended.   DVT prophylaxis:  Lovenox 40mg  q24h Code Status: Full Family Communication: Sister at bedside Disposition Plan:  Status is: Inpatient   Remains inpatient appropriate because:Inpatient level of care appropriate due to severity of illness   Dispo: The patient is from: Home              Anticipated d/c is to: Home              Anticipated d/c date is: > 3 days              Patient currently is not medically stable to d/c.  Consultants:   Surgery  GI  Cardiology  Procedures:   Colectomy planned 7/12  Antimicrobials:  Cefotetan    Subjective: No complaints. Having loose BMs, decreasing frequency, no abd pain. No chest pain or dyspnea reported.   Objective: Vitals:   05/28/20 1838 05/28/20 2224 05/29/20 0257 05/29/20 0537  BP: (!) 153/79 140/78 119/66 133/79  Pulse: 87 72 64 71  Resp: 18 20 19 20   Temp: 98.1 F (36.7 C) 98 F (36.7 C) 98.4 F (36.9 C) 98.6 F (37 C)  TempSrc: Oral Oral Oral Oral  SpO2: 100% 97% 94% 96%  Weight:      Height:        Intake/Output Summary (Last 24 hours) at 05/29/2020 1139 Last data filed at 05/29/2020 0630 Gross per 24 hour  Intake 1854.83 ml  Output --  Net 1854.83 ml   Filed Weights   05/26/20 1630 05/26/20 2021 05/27/20 1115  Weight: 63.5 kg 63.5 kg 65.3 kg   Gen: 79 y.o. female in no distress Pulm: Nonlabored breathing room air. Clear. CV: Regular rate and rhythm. No murmur, rub, or gallop. No JVD, no dependent edema. GI: Abdomen soft, non-tender,  non-distended, with normoactive bowel sounds.  Ext: Warm, no deformities Skin: No rashes, lesions or ulcers on visualized skin. Neuro: Alert and oriented. No focal neurological deficits. Psych: Judgement and insight appear fair. Mood euthymic & affect congruent. Behavior is appropriate.    Data Reviewed: I have personally reviewed following labs and imaging studies  CBC: Recent Labs  Lab 05/26/20 1745 05/27/20 0307 05/28/20 0235 05/29/20 0323  WBC 12.7* 17.7* 11.7* 8.8  HGB 14.7 12.9  10.9* 11.2*  HCT 43.9 38.7 33.7* 33.5*  MCV 95.4 96.3 98.0 94.6  PLT 428* 358 325 741   Basic Metabolic Panel: Recent Labs  Lab 05/26/20 1745 05/27/20 0307 05/28/20 0235 05/29/20 0323  NA 139  --  139 137  K 3.9  --  3.5 3.2*  CL 97*  --  111 107  CO2 28  --  19* 22  GLUCOSE 128*  --  90 127*  BUN 19  --  19 8  CREATININE 0.80 0.83 0.75 0.72  CALCIUM 9.9  --  7.3* 7.6*  MG  --   --   --  1.9   GFR: Estimated Creatinine Clearance: 55.1 mL/min (by C-G formula based on SCr of 0.72 mg/dL). Liver Function Tests: Recent Labs  Lab 05/26/20 1745  AST 22  ALT 16  ALKPHOS 60  BILITOT 0.7  PROT 8.5*  ALBUMIN 4.2   Recent Labs  Lab 05/26/20 1745  LIPASE 18   No results for input(s): AMMONIA in the last 168 hours. Coagulation Profile: No results for input(s): INR, PROTIME in the last 168 hours. Cardiac Enzymes: No results for input(s): CKTOTAL, CKMB, CKMBINDEX, TROPONINI in the last 168 hours. BNP (last 3 results) No results for input(s): PROBNP in the last 8760 hours. HbA1C: Recent Labs    05/27/20 0300  HGBA1C 5.8*   CBG: No results for input(s): GLUCAP in the last 168 hours. Lipid Profile: Recent Labs    05/27/20 0300  CHOL 144  HDL 48  LDLCALC 86  TRIG 52  CHOLHDL 3.0   Thyroid Function Tests: Recent Labs    05/29/20 0323  TSH 4.177   Anemia Panel: No results for input(s): VITAMINB12, FOLATE, FERRITIN, TIBC, IRON, RETICCTPCT in the last 72 hours. Urine analysis:    Component Value Date/Time   COLORURINE YELLOW 05/26/2020 1635   APPEARANCEUR CLEAR 05/26/2020 1635   LABSPEC 1.025 05/26/2020 1635   PHURINE 5.0 05/26/2020 1635   GLUCOSEU NEGATIVE 05/26/2020 1635   HGBUR MODERATE (A) 05/26/2020 1635   BILIRUBINUR NEGATIVE 05/26/2020 1635   KETONESUR 80 (A) 05/26/2020 1635   PROTEINUR 30 (A) 05/26/2020 1635   NITRITE NEGATIVE 05/26/2020 1635   LEUKOCYTESUR NEGATIVE 05/26/2020 1635   Recent Results (from the past 240 hour(s))  SARS Coronavirus  2 by RT PCR (hospital order, performed in [facility] hospital lab) Nasopharyngeal Nasopharyngeal Swab     Status: None   Collection Time: 05/26/20  9:01 PM   Specimen: Nasopharyngeal Swab  Result Value Ref Range Status   SARS Coronavirus 2 NEGATIVE NEGATIVE Final    Comment: (NOTE) SARS-CoV-2 target nucleic acids are NOT DETECTED.  The SARS-CoV-2 RNA is generally detectable in upper and lower respiratory specimens during the acute phase of infection. The lowest concentration of SARS-CoV-2 viral copies this assay can detect is 250 copies / mL. A negative result does not preclude SARS-CoV-2 infection and should not be used as the sole basis for treatment or other patient management decisions.  A negative result may occur with improper specimen  collection / handling, submission of specimen other than nasopharyngeal swab, presence of viral mutation(s) within the areas targeted by this assay, and inadequate number of viral copies (<250 copies / mL). A negative result must be combined with clinical observations, patient history, and epidemiological information.  Fact Sheet for Patients:   StrictlyIdeas.no  Fact Sheet for Healthcare Providers: BankingDealers.co.za  This test is not yet approved or  cleared by the [place] FDA and has been authorized for detection and/or diagnosis of SARS-CoV-2 by FDA under an Emergency Use Authorization (EUA).  This EUA will remain in effect (meaning this test can be used) for the duration of the COVID-19 declaration under Section 564(b)(1) of the Act, 21 U.S.C. section 360bbb-3(b)(1), unless the authorization is terminated or revoked sooner.  Performed at [facility], [place] [address], [place], [place] [address]   MRSA PCR Screening     Status: None   Collection Time: 05/27/20 11:46 AM   Specimen: Nasal Mucosa; Nasopharyngeal  Result Value Ref Range Status   MRSA by PCR NEGATIVE  NEGATIVE Final    Comment:        The GeneXpert MRSA Assay (FDA approved for NASAL specimens only), is one component of a comprehensive MRSA colonization surveillance program. It is not intended to diagnose MRSA infection nor to guide or monitor treatment for MRSA infections. Performed at [facility], [place] [address], [place], [place] [address]       Radiology Studies: ECHOCARDIOGRAM COMPLETE  Result Date: 05/27/2020    ECHOCARDIOGRAM REPORT   Patient Name:   CHENNEL OLIVOS Date of Exam: 05/27/2020 Medical Rec #:  [id number]           Height:       66.0 in Accession #:    [id number]          Weight:       144.0 lb Date of Birth:  28-Jun-1941            BSA:          1.739 m Patient Age:    40 years            BP:           130/54 mmHg Patient Gender: F                   HR:           73 bpm. Exam Location:  Inpatient Procedure: 2D Echo Indications:    atrial fibrillation  History:        Patient has no prior history of Echocardiogram examinations.                 Risk Factors:Dyslipidemia. Cancer.  Sonographer:    Jannett Celestine RDCS (AE) Referring Phys: [id number] [place]  1. Left ventricular ejection fraction, by estimation, is 55 to 60%. The left ventricle has normal function. The left ventricle has no regional wall motion abnormalities. Left ventricular diastolic parameters were normal.  2. Right ventricular systolic function is normal. The right ventricular size is mildly enlarged.  3. Right atrial size was mildly dilated.  4. The mitral valve is normal in structure. No evidence of mitral valve regurgitation.  5. The aortic valve is grossly normal. Aortic valve regurgitation is not visualized. FINDINGS  Left Ventricle: Left ventricular ejection fraction, by estimation, is 55 to 60%. The left ventricle has normal function. The left ventricle has no regional wall motion abnormalities. The left ventricular  internal cavity size was normal in size. There  is  no left ventricular hypertrophy. Left ventricular diastolic parameters were normal. Right Ventricle: The right ventricular size is mildly enlarged. No increase in right ventricular wall thickness. Right ventricular systolic function is normal. Left Atrium: Left atrial size was normal in size. Right Atrium: Right atrial size was mildly dilated. Pericardium: There is no evidence of pericardial effusion. Mitral Valve: The mitral valve is normal in structure. No evidence of mitral valve regurgitation. Tricuspid Valve: The tricuspid valve is grossly normal. Tricuspid valve regurgitation is mild. Aortic Valve: The aortic valve is grossly normal. Aortic valve regurgitation is not visualized. Pulmonic Valve: The pulmonic valve was not well visualized. Pulmonic valve regurgitation is not visualized. Aorta: The aortic root is normal in size and structure. IAS/Shunts: No atrial level shunt detected by color flow Doppler.  LEFT VENTRICLE PLAX 2D LVIDd:         4.50 cm  Diastology LVIDs:         3.20 cm  LV e' lateral:   8.81 cm/s LV PW:         0.80 cm  LV E/e' lateral: 10.4 LV IVS:        0.80 cm  LV e' medial:    10.60 cm/s LVOT diam:     1.90 cm  LV E/e' medial:  8.7 LV SV:         35 LV SV Index:   20 LVOT Area:     2.84 cm  RIGHT VENTRICLE TAPSE (M-mode): 1.8 cm LEFT ATRIUM           Index      RIGHT ATRIUM           Index LA diam:      3.50 cm 2.01 cm/m RA Area:     16.10 cm LA Vol (A4C): 17.2 ml 9.89 ml/m RA Volume:   36.50 ml  20.99 ml/m  AORTIC VALVE LVOT Vmax:   69.00 cm/s LVOT Vmean:  46.800 cm/s LVOT VTI:    0.125 m  AORTA Ao Root diam: 3.00 cm MITRAL VALVE MV Area (PHT): 2.22 cm    SHUNTS MV Decel Time: 341 msec    Systemic VTI:  0.12 m MV E velocity: 91.80 cm/s  Systemic Diam: 1.90 cm Dorris Carnes MD Electronically signed by Dorris Carnes MD Signature Date/Time: 05/27/2020/6:15:21 PM    Final     Scheduled Meds: . Chlorhexidine Gluconate Cloth  6 each Topical Daily  . enoxaparin (LOVENOX) injection  40 mg  Subcutaneous Q24H  . pravastatin  20 mg Oral Daily   Continuous Infusions: . cefoTEtan (CEFOTAN) IV 2 g (05/29/20 0052)  . dextrose 5 % and 0.45 % NaCl with KCl 40 mEq/L 100 mL/hr at 05/29/20 1058  . magnesium sulfate bolus IVPB 1 g (05/29/20 1114)     LOS: 3 days   Time spent: 25 minutes.  Patrecia Pour, MD [facility] [email or web address] 05/29/2020, 11:39 AM

## 2020-05-30 ENCOUNTER — Encounter (HOSPITAL_COMMUNITY): Payer: Self-pay | Admitting: Internal Medicine

## 2020-05-30 ENCOUNTER — Encounter (HOSPITAL_COMMUNITY)
Admission: EM | Disposition: A | Discharge status: Home / Self Care | Payer: Self-pay | Source: Home / Self Care | Attending: Family Medicine

## 2020-05-30 ENCOUNTER — Inpatient Hospital Stay (HOSPITAL_COMMUNITY): Payer: Medicare Other | Admitting: Certified Registered"

## 2020-05-30 ENCOUNTER — Other Ambulatory Visit: Payer: Self-pay

## 2020-05-30 HISTORY — PX: COLON RESECTION: SHX5231

## 2020-05-30 LAB — CBC
HCT: 35.1 % — ABNORMAL LOW (ref 36.0–46.0)
HCT: 39.2 % (ref 36.0–46.0)
Hemoglobin: 11.8 g/dL — ABNORMAL LOW (ref 12.0–15.0)
Hemoglobin: 13 g/dL (ref 12.0–15.0)
MCH: 31.3 pg (ref 26.0–34.0)
MCH: 31.3 pg (ref 26.0–34.0)
MCHC: 33.6 g/dL (ref 30.0–36.0)
MCHC: 33.2 g/dL (ref 30.0–36.0)
MCV: 93.1 fL (ref 80.0–100.0)
MCV: 94.2 fL (ref 80.0–100.0)
Platelets: 324 10*3/uL (ref 150–400)
Platelets: 338 10*3/uL (ref 150–400)
RBC: 3.77 MIL/uL — ABNORMAL LOW (ref 3.87–5.11)
RBC: 4.16 MIL/uL (ref 3.87–5.11)
RDW: 13 % (ref 11.5–15.5)
RDW: 13.1 % (ref 11.5–15.5)
WBC: 7.6 10*3/uL (ref 4.0–10.5)
WBC: 15.4 10*3/uL — ABNORMAL HIGH (ref 4.0–10.5)
nRBC: 0 % (ref 0.0–0.2)
nRBC: 0 % (ref 0.0–0.2)

## 2020-05-30 LAB — BASIC METABOLIC PANEL
Anion gap: 6 (ref 5–15)
BUN: <5 mg/dL — ABNORMAL LOW (ref 8–23)
CO2: 23 mmol/L (ref 22–32)
Calcium: 7.9 mg/dL — ABNORMAL LOW (ref 8.9–10.3)
Chloride: 108 mmol/L (ref 98–111)
Creatinine, Ser: 0.84 mg/dL (ref 0.44–1.00)
GFR calc Af Amer: >60 mL/min (ref >60)
GFR calc non Af Amer: >60 mL/min (ref >60)
Glucose, Bld: 115 mg/dL — ABNORMAL HIGH (ref 70–99)
Potassium: 3.7 mmol/L (ref 3.5–5.1)
Sodium: 137 mmol/L (ref 135–145)

## 2020-05-30 LAB — PROTIME-INR
INR: 1.1 (ref 0.8–1.2)
Prothrombin Time: 13.5 seconds (ref 11.4–15.2)

## 2020-05-30 LAB — TYPE AND SCREEN
ABO/RH(D): O POS
Antibody Screen: NEGATIVE

## 2020-05-30 LAB — MAGNESIUM: Magnesium: 2 mg/dL (ref 1.7–2.4)

## 2020-05-30 LAB — ABO/RH: ABO/RH(D): O POS

## 2020-05-30 SURGERY — COLON RESECTION
Anesthesia: General | Site: Abdomen

## 2020-05-30 MED ORDER — FENTANYL CITRATE (PF) 100 MCG/2ML IJ SOLN
INTRAMUSCULAR | Status: AC
  Filled 2020-05-30: qty 2

## 2020-05-30 MED ORDER — KETOROLAC TROMETHAMINE 15 MG/ML IJ SOLN
15.0000 mg | Freq: Once | INTRAMUSCULAR | Status: AC | PRN
  Administered 2020-05-30: 15 mg via INTRAVENOUS

## 2020-05-30 MED ORDER — ONDANSETRON HCL 4 MG/2ML IJ SOLN
INTRAMUSCULAR | Status: DC | PRN
  Administered 2020-05-30: 4 mg via INTRAVENOUS

## 2020-05-30 MED ORDER — PROPOFOL 10 MG/ML IV BOLUS
INTRAVENOUS | Status: AC
  Filled 2020-05-30: qty 20

## 2020-05-30 MED ORDER — BUPIVACAINE-EPINEPHRINE (PF) 0.25% -1:200000 IJ SOLN
INTRAMUSCULAR | Status: AC
  Filled 2020-05-30: qty 30

## 2020-05-30 MED ORDER — ACETAMINOPHEN 10 MG/ML IV SOLN
INTRAVENOUS | Status: AC
  Administered 2020-05-30: 1000 mg via INTRAVENOUS
  Filled 2020-05-30: qty 100

## 2020-05-30 MED ORDER — FENTANYL CITRATE (PF) 100 MCG/2ML IJ SOLN
INTRAMUSCULAR | Status: AC
  Administered 2020-05-30: 50 ug via INTRAVENOUS
  Filled 2020-05-30: qty 2

## 2020-05-30 MED ORDER — PHENYLEPHRINE HCL (PRESSORS) 10 MG/ML IV SOLN
INTRAVENOUS | Status: AC
  Filled 2020-05-30: qty 1

## 2020-05-30 MED ORDER — BOOST / RESOURCE BREEZE PO LIQD CUSTOM
1.0000 | Freq: Three times a day (TID) | ORAL | Status: DC
  Administered 2020-05-31 – 2020-06-01 (×5): 1 via ORAL

## 2020-05-30 MED ORDER — FENTANYL CITRATE (PF) 100 MCG/2ML IJ SOLN
INTRAMUSCULAR | Status: DC | PRN
  Administered 2020-05-30 (×4): 50 ug via INTRAVENOUS

## 2020-05-30 MED ORDER — LIDOCAINE 2% (20 MG/ML) 5 ML SYRINGE
INTRAMUSCULAR | Status: DC | PRN
  Administered 2020-05-30: 40 mg via INTRAVENOUS

## 2020-05-30 MED ORDER — FENTANYL CITRATE (PF) 100 MCG/2ML IJ SOLN
25.0000 ug | INTRAMUSCULAR | Status: DC | PRN
  Administered 2020-05-30: 50 ug via INTRAVENOUS

## 2020-05-30 MED ORDER — HYDROMORPHONE HCL 1 MG/ML IJ SOLN
0.5000 mg | INTRAMUSCULAR | Status: DC | PRN
  Administered 2020-05-30: 1 mg via INTRAVENOUS
  Filled 2020-05-30: qty 2

## 2020-05-30 MED ORDER — ENOXAPARIN SODIUM 40 MG/0.4ML ~~LOC~~ SOLN: 40.0000 mg | SUBCUTANEOUS | Status: DC

## 2020-05-30 MED ORDER — CHLORHEXIDINE GLUCONATE 0.12 % MT SOLN
15.0000 mL | Freq: Once | OROMUCOSAL | Status: AC
  Administered 2020-05-30: 15 mL via OROMUCOSAL

## 2020-05-30 MED ORDER — PHENYLEPHRINE 40 MCG/ML (10ML) SYRINGE FOR IV PUSH (FOR BLOOD PRESSURE SUPPORT)
PREFILLED_SYRINGE | INTRAVENOUS | Status: DC | PRN
  Administered 2020-05-30: 80 ug via INTRAVENOUS
  Administered 2020-05-30: 120 ug via INTRAVENOUS
  Administered 2020-05-30 (×2): 80 ug via INTRAVENOUS

## 2020-05-30 MED ORDER — ONDANSETRON HCL 4 MG/2ML IJ SOLN: 4.0000 mg | Freq: Once | INTRAMUSCULAR | Status: DC | PRN

## 2020-05-30 MED ORDER — LIDOCAINE 2% (20 MG/ML) 5 ML SYRINGE
INTRAMUSCULAR | Status: AC
  Filled 2020-05-30: qty 5

## 2020-05-30 MED ORDER — KETOROLAC TROMETHAMINE 30 MG/ML IJ SOLN
INTRAMUSCULAR | Status: AC
  Filled 2020-05-30: qty 1

## 2020-05-30 MED ORDER — PROPOFOL 500 MG/50ML IV EMUL
INTRAVENOUS | Status: DC | PRN
  Administered 2020-05-30: 25 ug/kg/min via INTRAVENOUS

## 2020-05-30 MED ORDER — DEXAMETHASONE SODIUM PHOSPHATE 10 MG/ML IJ SOLN
INTRAMUSCULAR | Status: DC | PRN
  Administered 2020-05-30: 4 mg via INTRAVENOUS

## 2020-05-30 MED ORDER — ACETAMINOPHEN 10 MG/ML IV SOLN: 1000.0000 mg | Freq: Once | INTRAVENOUS | Status: DC | PRN

## 2020-05-30 MED ORDER — PROPOFOL 10 MG/ML IV BOLUS
INTRAVENOUS | Status: DC | PRN
  Administered 2020-05-30: 200 mg via INTRAVENOUS

## 2020-05-30 MED ORDER — ACETAMINOPHEN 500 MG PO TABS
1000.0000 mg | ORAL_TABLET | Freq: Four times a day (QID) | ORAL | Status: DC
  Administered 2020-05-30 – 2020-06-02 (×12): 1000 mg via ORAL
  Filled 2020-05-30 (×16): qty 2

## 2020-05-30 MED ORDER — ROCURONIUM BROMIDE 10 MG/ML (PF) SYRINGE
PREFILLED_SYRINGE | INTRAVENOUS | Status: AC
  Filled 2020-05-30: qty 10

## 2020-05-30 MED ORDER — PHENYLEPHRINE HCL-NACL 10-0.9 MG/250ML-% IV SOLN
INTRAVENOUS | Status: DC | PRN
  Administered 2020-05-30: 25 ug/min via INTRAVENOUS

## 2020-05-30 MED ORDER — 0.9 % SODIUM CHLORIDE (POUR BTL) OPTIME
TOPICAL | Status: DC | PRN
  Administered 2020-05-30 (×2): 2000 mL

## 2020-05-30 MED ORDER — DEXAMETHASONE SODIUM PHOSPHATE 10 MG/ML IJ SOLN
INTRAMUSCULAR | Status: AC
  Filled 2020-05-30: qty 1

## 2020-05-30 MED ORDER — ONDANSETRON HCL 4 MG/2ML IJ SOLN
INTRAMUSCULAR | Status: AC
  Filled 2020-05-30: qty 2

## 2020-05-30 MED ORDER — BUPIVACAINE-EPINEPHRINE 0.25% -1:200000 IJ SOLN
INTRAMUSCULAR | Status: DC | PRN
  Administered 2020-05-30: 20 mL

## 2020-05-30 MED ORDER — SUGAMMADEX SODIUM 200 MG/2ML IV SOLN
INTRAVENOUS | Status: DC | PRN
  Administered 2020-05-30: 260 mg via INTRAVENOUS

## 2020-05-30 MED ORDER — LACTATED RINGERS IV SOLN: INTRAVENOUS | Status: DC

## 2020-05-30 MED ORDER — ROCURONIUM BROMIDE 10 MG/ML (PF) SYRINGE
PREFILLED_SYRINGE | INTRAVENOUS | Status: DC | PRN
  Administered 2020-05-30: 60 mg via INTRAVENOUS
  Administered 2020-05-30: 20 mg via INTRAVENOUS

## 2020-05-30 SURGICAL SUPPLY — 51 items
APL PRP STRL LF DISP 70% ISPRP (MISCELLANEOUS) ×2
BLADE EXTENDED COATED 6.5IN (ELECTRODE) ×1 IMPLANT
CHLORAPREP W/TINT 26 (MISCELLANEOUS) ×4 IMPLANT
COVER SURGICAL LIGHT HANDLE (MISCELLANEOUS) ×4 IMPLANT
COVER WAND RF STERILE (DRAPES) IMPLANT
DRAIN CHANNEL 19F RND (DRAIN) ×1 IMPLANT
DRSG OPSITE POSTOP 4X10 (GAUZE/BANDAGES/DRESSINGS) ×1 IMPLANT
DRSG OPSITE POSTOP 4X6 (GAUZE/BANDAGES/DRESSINGS) IMPLANT
DRSG OPSITE POSTOP 4X8 (GAUZE/BANDAGES/DRESSINGS) IMPLANT
DRSG PAD ABDOMINAL 8X10 ST (GAUZE/BANDAGES/DRESSINGS) IMPLANT
DRSG TEGADERM 4X4.75 (GAUZE/BANDAGES/DRESSINGS) ×1 IMPLANT
ELECT REM PT RETURN 15FT ADLT (MISCELLANEOUS) ×2 IMPLANT
EVACUATOR SILICONE 100CC (DRAIN) ×1 IMPLANT
GAUZE SPONGE 2X2 8PLY STRL LF (GAUZE/BANDAGES/DRESSINGS) IMPLANT
GAUZE SPONGE 4X4 12PLY STRL (GAUZE/BANDAGES/DRESSINGS) IMPLANT
GLOVE SURG ORTHO 8.0 STRL STRW (GLOVE) ×4 IMPLANT
GOWN STRL REUS W/TWL XL LVL3 (GOWN DISPOSABLE) ×8 IMPLANT
HANDLE SUCTION POOLE (INSTRUMENTS) IMPLANT
KIT TURNOVER KIT A (KITS) IMPLANT
LIGASURE IMPACT 36 18CM CVD LR (INSTRUMENTS) ×1 IMPLANT
PACK COLON (CUSTOM PROCEDURE TRAY) ×2 IMPLANT
PENCIL SMOKE EVACUATOR (MISCELLANEOUS) IMPLANT
RELOAD PROXIMATE 75MM BLUE (ENDOMECHANICALS) ×2 IMPLANT
RELOAD STAPLE 75 3.8 BLU REG (ENDOMECHANICALS) IMPLANT
SPONGE GAUZE 2X2 STER 10/PKG (GAUZE/BANDAGES/DRESSINGS) ×1
SPONGE LAP 18X18 RF (DISPOSABLE) IMPLANT
STAPLER ECHELON POWER CIR 31 (STAPLE) ×1 IMPLANT
STAPLER PROXIMATE 75MM BLUE (STAPLE) ×1 IMPLANT
STAPLER VISISTAT 35W (STAPLE) ×1 IMPLANT
SUCTION POOLE HANDLE (INSTRUMENTS)
SUT ETHILON 2 0 PS N (SUTURE) ×1 IMPLANT
SUT NOV 1 T60/GS (SUTURE) IMPLANT
SUT NOVA NAB DX-16 0-1 5-0 T12 (SUTURE) IMPLANT
SUT NOVA T20/GS 25 (SUTURE) IMPLANT
SUT PDS AB 0 CTX 36 PDP370T (SUTURE) ×1 IMPLANT
SUT PDS AB 1 TP1 96 (SUTURE) IMPLANT
SUT PROLENE 2 0 KS (SUTURE) ×1 IMPLANT
SUT SILK 2 0 (SUTURE) ×4
SUT SILK 2 0 SH CR/8 (SUTURE) ×2 IMPLANT
SUT SILK 2 0SH CR/8 30 (SUTURE) IMPLANT
SUT SILK 2-0 18XBRD TIE 12 (SUTURE) ×1 IMPLANT
SUT SILK 2-0 30XBRD TIE 12 (SUTURE) ×1 IMPLANT
SUT SILK 3 0 (SUTURE) ×2
SUT SILK 3 0 SH CR/8 (SUTURE) ×3 IMPLANT
SUT SILK 3-0 18XBRD TIE 12 (SUTURE) ×1 IMPLANT
SUT VIC AB 2-0 SH 27 (SUTURE) ×2
SUT VIC AB 2-0 SH 27X BRD (SUTURE) IMPLANT
TOWEL OR 17X26 10 PK STRL BLUE (TOWEL DISPOSABLE) IMPLANT
TOWEL OR NON WOVEN STRL DISP B (DISPOSABLE) ×4 IMPLANT
TRAY FOLEY MTR SLVR 14FR STAT (SET/KITS/TRAYS/PACK) ×1 IMPLANT
TUBING CONNECTING 10 (TUBING) ×4 IMPLANT

## 2020-05-30 NOTE — Progress Notes (Signed)
PROGRESS NOTE  Kathy Robinson  XIP:[id number] DOB: [birth year] DOA: 05/26/2020 PCP: Aretta Nip, MD   Brief Narrative: Kathy Robinson is a healthy 79 y.o. female with presented to the ED with intractable vomiting and colonic mass seen on CT as an outpatient. underwent a CT which revealed a masslike circumferential thickening of the descending colon consistent with malignancy associated high-grade colonic obstruction as well as microperforation but no drainable abscess. There was also retroperitoneal adenopathy. She was admitted by general surgery but developed AFib with RVR for which diltiazem infusion was started and cardiology consulted. Fortunately, she's converted to NSR. Open partial colectomy was performed 05/30/2020  Assessment & Plan: Principal Problem:   Colonic obstruction (HCC) Active Problems:   Colonic mass   Atrial fibrillation with RVR (HCC)  Colon mass with obstruction and microperforation, retroperitoneal lymphadenopathy:  - Surgery today per general surgery. - Diet per surgery  - CEA 13.4.    New onset paroxysmal atrial fibrillation with RVR: RVR resolved, back into NSR after discontinuing diltiazem infusion. No significant structural heart disease on echocardiogram. TSH 4.177.  - Cardiology consulted. No further risk assessment recommended preoperatively.  - Durably converted to sinus rhythm. Avoiding starting new BB preoperatively, could consider amiodarone if converts back to AFib w/elevated rate. Will monitor postoperatively. - Deferring anticoagulation for now.   Hypokalemia:  - Continue K in IVF. At goal.  Troponin elevation: Due to demand myocardial ischemia in setting of RVR. No chest pain.  - Per cardiology, no further work up planned.   Hyperlipidemia: HDL 48, LDL 86. - Continue statin.   Prediabetes: HbA1c 5.8%. - PCP follow up recommended.   DVT prophylaxis: Lovenox 40mg  q24h Code Status: Full Family Communication: Sister and nephew at  bedside Disposition Plan:  Status is: Inpatient   Remains inpatient appropriate because:Inpatient level of care appropriate due to severity of illness   Dispo: The patient is from: Home              Anticipated d/c is to: Home              Anticipated d/c date is: > 3 days              Patient currently is not medically stable to d/c.  Consultants:   Surgery  GI  Cardiology  Procedures:   Colectomy 7/12  Antimicrobials:  Cefotetan    Subjective: No complaints, ready for surgery.   Objective: Vitals:   05/30/20 1330 05/30/20 1345 05/30/20 1400 05/30/20 1438  BP: 127/65 109/82 119/60 127/65  Pulse: 61 61 68 67  Resp: 14 16 19 18   Temp: 97.6 F (36.4 C) 97.7 F (36.5 C)  97.7 F (36.5 C)  TempSrc:    Oral  SpO2: 96% 98% 100% 99%  Weight:      Height:        Intake/Output Summary (Last 24 hours) at 05/30/2020 2021 Last data filed at 05/30/2020 1956 Gross per 24 hour  Intake 3080.34 ml  Output 750 ml  Net 2330.34 ml   Filed Weights   05/26/20 2021 05/27/20 1115 05/30/20 0924  Weight: 63.5 kg 65.3 kg 65.3 kg   Gen: 79 y.o. female in no distress Pulm: Nonlabored breathing room air. Clear. CV: Regular rate and rhythm. No murmur, rub, or gallop. No JVD, no dependent edema. GI: Abdomen soft, non-tender, non-distended, with normoactive bowel sounds.  Ext: Warm, no deformities Skin: No rashes, lesions or ulcers on visualized skin. Neuro: Alert and oriented. No focal neurological  deficits. Psych: Judgement and insight appear fair. Mood euthymic & affect congruent. Behavior is appropriate.    Data Reviewed: I have personally reviewed following labs and imaging studies  CBC: Recent Labs  Lab 05/26/20 1745 05/27/20 0307 05/28/20 0235 05/29/20 0323 05/30/20 0417  WBC 12.7* 17.7* 11.7* 8.8 7.6  HGB 14.7 12.9 10.9* 11.2* 11.8*  HCT 43.9 38.7 33.7* 33.5* 35.1*  MCV 95.4 96.3 98.0 94.6 93.1  PLT 428* 358 325 306 650   Basic Metabolic Panel: Recent Labs    Lab 05/26/20 1745 05/27/20 0307 05/28/20 0235 05/29/20 0323 05/30/20 0417  NA 139  --  139 137 137  K 3.9  --  3.5 3.2* 3.7  CL 97*  --  111 107 108  CO2 28  --  19* 22 23  GLUCOSE 128*  --  90 127* 115*  BUN 19  --  19 8 <5*  CREATININE 0.80 0.83 0.75 0.72 0.84  CALCIUM 9.9  --  7.3* 7.6* 7.9*  MG  --   --   --  1.9 2.0   GFR: Estimated Creatinine Clearance: 52.5 mL/min (by C-G formula based on SCr of 0.84 mg/dL). Liver Function Tests: Recent Labs  Lab 05/26/20 1745  AST 22  ALT 16  ALKPHOS 60  BILITOT 0.7  PROT 8.5*  ALBUMIN 4.2   Recent Labs  Lab 05/26/20 1745  LIPASE 18   No results for input(s): AMMONIA in the last 168 hours. Coagulation Profile: No results for input(s): INR, PROTIME in the last 168 hours. Cardiac Enzymes: No results for input(s): CKTOTAL, CKMB, CKMBINDEX, TROPONINI in the last 168 hours. BNP (last 3 results) No results for input(s): PROBNP in the last 8760 hours. HbA1C: No results for input(s): HGBA1C in the last 72 hours. CBG: No results for input(s): GLUCAP in the last 168 hours. Lipid Profile: No results for input(s): CHOL, HDL, LDLCALC, TRIG, CHOLHDL, LDLDIRECT in the last 72 hours. Thyroid Function Tests: Recent Labs    05/29/20 0323  TSH 4.177   Anemia Panel: No results for input(s): VITAMINB12, FOLATE, FERRITIN, TIBC, IRON, RETICCTPCT in the last 72 hours. Urine analysis:    Component Value Date/Time   COLORURINE YELLOW 05/26/2020 1635   APPEARANCEUR CLEAR 05/26/2020 1635   LABSPEC 1.025 05/26/2020 1635   PHURINE 5.0 05/26/2020 1635   GLUCOSEU NEGATIVE 05/26/2020 1635   HGBUR MODERATE (A) 05/26/2020 1635   BILIRUBINUR NEGATIVE 05/26/2020 1635   KETONESUR 80 (A) 05/26/2020 1635   PROTEINUR 30 (A) 05/26/2020 1635   NITRITE NEGATIVE 05/26/2020 1635   LEUKOCYTESUR NEGATIVE 05/26/2020 1635   Recent Results (from the past 240 hour(s))  SARS Coronavirus 2 by RT PCR (hospital order, performed in [facility] hospital lab)  Nasopharyngeal Nasopharyngeal Swab     Status: None   Collection Time: 05/26/20  9:01 PM   Specimen: Nasopharyngeal Swab  Result Value Ref Range Status   SARS Coronavirus 2 NEGATIVE NEGATIVE Final    Comment: (NOTE) SARS-CoV-2 target nucleic acids are NOT DETECTED.  The SARS-CoV-2 RNA is generally detectable in upper and lower respiratory specimens during the acute phase of infection. The lowest concentration of SARS-CoV-2 viral copies this assay can detect is 250 copies / mL. A negative result does not preclude SARS-CoV-2 infection and should not be used as the sole basis for treatment or other patient management decisions.  A negative result may occur with improper specimen collection / handling, submission of specimen other than nasopharyngeal swab, presence of viral mutation(s) within the areas targeted  by this assay, and inadequate number of viral copies (<250 copies / mL). A negative result must be combined with clinical observations, patient history, and epidemiological information.  Fact Sheet for Patients:   StrictlyIdeas.no  Fact Sheet for Healthcare Providers: BankingDealers.co.za  This test is not yet approved or  cleared by the [place] FDA and has been authorized for detection and/or diagnosis of SARS-CoV-2 by FDA under an Emergency Use Authorization (EUA).  This EUA will remain in effect (meaning this test can be used) for the duration of the COVID-19 declaration under Section 564(b)(1) of the Act, 21 U.S.C. section 360bbb-3(b)(1), unless the authorization is terminated or revoked sooner.  Performed at [facility], [place] [address], [place], [place] [address]   MRSA PCR Screening     Status: None   Collection Time: 05/27/20 11:46 AM   Specimen: Nasal Mucosa; Nasopharyngeal  Result Value Ref Range Status   MRSA by PCR NEGATIVE NEGATIVE Final    Comment:        The GeneXpert MRSA Assay  (FDA approved for NASAL specimens only), is one component of a comprehensive MRSA colonization surveillance program. It is not intended to diagnose MRSA infection nor to guide or monitor treatment for MRSA infections. Performed at [facility], [place] [address], [place], [place] [address]       Radiology Studies: No results found.  Scheduled Meds:  acetaminophen  1,000 mg Oral Q6H   Chlorhexidine Gluconate Cloth  6 each Topical Daily   [START ON 05/31/2020] enoxaparin (LOVENOX) injection  40 mg Subcutaneous Q24H   feeding supplement  1 Container Oral TID BM   ketorolac       melatonin  3 mg Oral QHS   pravastatin  20 mg Oral Daily   Continuous Infusions:  dextrose 5 % and 0.45 % NaCl with KCl 40 mEq/L 100 mL/hr at 05/30/20 1744     LOS: 4 days   Time spent: 25 minutes.  Patrecia Pour, MD [facility] [email or web address] 05/30/2020, 8:21 PM

## 2020-05-30 NOTE — Anesthesia Procedure Notes (Signed)
Procedure Name: Intubation Date/Time: 05/30/2020 10:13 AM Performed by: Niel Hummer, CRNA Pre-anesthesia Checklist: Patient identified, Emergency Drugs available, Suction available and Patient being monitored Patient Re-evaluated:Patient Re-evaluated prior to induction Oxygen Delivery Method: Circle system utilized Preoxygenation: Pre-oxygenation with 100% oxygen Induction Type: IV induction Ventilation: Mask ventilation without difficulty Laryngoscope Size: Glidescope and 3 Grade View: Grade I Tube type: Oral Tube size: 7.0 mm Number of attempts: 1 Airway Equipment and Method: Stylet and Video-laryngoscopy Placement Confirmation: ETT inserted through vocal cords under direct vision,  positive ETCO2 and breath sounds checked- equal and bilateral Secured at: 21 cm Tube secured with: Tape Dental Injury: Teeth and Oropharynx as per pre-operative assessment  Comments: 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 Unable to pass tube. Mask ventilation. Glidescope S3 used, grade 1 view. Passed tube easily.

## 2020-05-30 NOTE — Transfer of Care (Signed)
Immediate Anesthesia Transfer of Care Note  Patient: Kathy Robinson  Procedure(s) Performed: OPEN PARTIAL COLECTOMY (N/A Abdomen)  Patient Location: PACU  Anesthesia Type:General  Level of Consciousness: awake, alert  and oriented  Airway & Oxygen Therapy: Patient Spontanous Breathing and Patient connected to face mask oxygen  Post-op Assessment: Report given to RN, Post -op Vital signs reviewed and stable and Patient moving all extremities X 4  Post vital signs: Reviewed and stable  Last Vitals:  Vitals Value Taken Time  BP 151/122 05/30/20 1249  Temp    Pulse 63 05/30/20 1250  Resp 19 05/30/20 1250  SpO2 92 % 05/30/20 1250  Vitals shown include unvalidated device data.  Last Pain:  Vitals:   05/30/20 0924  TempSrc:   PainSc: 0-No pain         Complications: No complications documented.

## 2020-05-30 NOTE — Op Note (Signed)
Preoperative diagnosis: colon partial obstruction with mass  Postoperative diagnosis: same   Procedure: left colectomy with colorectal anastomosis, splenic flexure mobilization  Surgeon: Gurney Maxin, M.D.  Asst: Ferdinand Cava, [facility]  Anesthesia: general  Indications for procedure: Kathy Robinson is a 78 y.o. year old female with symptoms of nausea and vomiting workup showing dilation of colon with left sided concern for mass.  Description of procedure: The patient was brought into the operative suite. Anesthesia was administered with General endotracheal anesthesia. WHO checklist was applied. The patient was then placed in lithotomy position. The area was prepped and draped in the usual sterile fashion.  Next, a midline incision was made. Cautery was used to dissect through the subcutaneous tissue and fascia. A moderate amount of clear fluid was removed. The liver was palpated and had no nodules. The right colon and small intestine were grossly normal. The left colon was firm and indurated up to the level of the splenic flexure. The white line of Toldt was incised. Blunt dissection was used to mobilize the left colon. The ureter was identified. The omentum was freed off the distal transverse colon. The splenic flexure was taken down with cautery.   A 75 mm GIA blue load stapler was used to divide the transverse colon just distal to the right branch of the mesocolic vessels. An additional 75 mm GIA blue load stapler was used to divide the sigmoid junction just above the pelvic reflection. There was an area of distal traverse colon and small intestine mesentery adhesed to the colon tumor. The mesentery was cut through with cautery. Bleeding veins were ligated with 3-0 silk. The small intestine remained viable and pink. The mesentery of the left colon was taken with ligasure. The vessels were ligated with 3-0 silk as well.  The transverse colon end was opened and pursestring device used to  place a 0 prolene pursestring. The size was assessed and 31 mm anvil placed and tied around. Next, I went below with the EEA stapler which easily passed up to the rectosigmoid stump and anastomosis was performed. Leak test was negative.  Colorectal protocol was performed. The abdomen was irrigated. A 19 fr blake drain was placed in the right lower abdomen with drain in the pelvis and right lower abdomen. The peritoneum was closed with 0 vicryl. The fascia was closed with 0 PDS in running fashion. Staples were used to appose the skin. All counts were correct. The patient tolerated the procedure well and transferred to pacu in stable condition.  Findings: large left sided bulky tumor, no tumor deposits of liver  Specimen: left colon  Implant: 19 fr blake drain   Blood loss: 100 ml  Local anesthesia: 20 ml marcaine   Complications: none  Gurney Maxin, M.D. General, Bariatric, & Minimally Invasive Surgery [facility]

## 2020-05-30 NOTE — Progress Notes (Signed)
Pre Procedure note for inpatients:   Kathy Robinson has been scheduled for Procedure(s): OPEN PARTIAL COLECTOMY (N/A) today. The various methods of treatment have been discussed with the patient. After consideration of the risks, benefits and treatment options the patient has consented to the planned procedure.   The patient has been seen and labs reviewed. There are no changes in the patient's condition to prevent proceeding with the planned procedure today.  Recent labs:  Lab Results  Component Value Date   WBC 7.6 05/30/2020   HGB 11.8 (L) 05/30/2020   HCT 35.1 (L) 05/30/2020   PLT 324 05/30/2020   GLUCOSE 115 (H) 05/30/2020   CHOL 144 05/27/2020   TRIG 52 05/27/2020   HDL 48 05/27/2020   LDLCALC 86 05/27/2020   ALT 16 05/26/2020   AST 22 05/26/2020   NA 137 05/30/2020   K 3.7 05/30/2020   CL 108 05/30/2020   CREATININE 0.84 05/30/2020   BUN <5 (L) 05/30/2020   CO2 23 05/30/2020   TSH 4.177 05/29/2020   HGBA1C 5.8 (H) 05/27/2020    Mickeal Skinner, MD 05/30/2020 8:05 AM

## 2020-05-30 NOTE — Anesthesia Preprocedure Evaluation (Addendum)
Anesthesia Evaluation  Patient identified by MRN, date of birth, ID band Patient awake    Reviewed: Allergy & Precautions, NPO status , Patient's Chart, lab work & pertinent test results  Airway Mallampati: III  TM Distance: >3 FB Neck ROM: Full    Dental no notable dental hx.    Pulmonary neg pulmonary ROS,    Pulmonary exam normal breath sounds clear to auscultation       Cardiovascular negative cardio ROS Normal cardiovascular exam Rhythm:Regular Rate:Normal  ECG: SR, rate 74  ECHO: 1. Left ventricular ejection fraction, by estimation, is 55 to 60%. The left ventricle has normal function. The left ventricle has no regional wall motion abnormalities. Left ventricular diastolic parameters were normal. 2. Right ventricular systolic function is normal. The right ventricular size is mildly enlarged. 3. Right atrial size was mildly dilated. 4. The mitral valve is normal in structure. No evidence of mitral valve regurgitation. 5. The aortic valve is grossly normal. Aortic valve regurgitation is not visualized.   Neuro/Psych negative neurological ROS  negative psych ROS   GI/Hepatic negative GI ROS, Neg liver ROS,   Endo/Other  negative endocrine ROS  Renal/GU negative Renal ROS     Musculoskeletal negative musculoskeletal ROS (+)   Abdominal   Peds  Hematology  (+) anemia , HLD   Anesthesia Other Findings colon obstruction  Reproductive/Obstetrics                            Anesthesia Physical Anesthesia Plan  ASA: II  Anesthesia Plan: General   Post-op Pain Management:    Induction: Intravenous  PONV Risk Score and Plan: 4 or greater and Ondansetron, Dexamethasone and Treatment may vary due to age or medical condition  Airway Management Planned: Oral ETT  Additional Equipment:   Intra-op Plan:   Post-operative Plan: Extubation in OR  Informed Consent: I have reviewed the  patients History and Physical, chart, labs and discussed the procedure including the risks, benefits and alternatives for the proposed anesthesia with the patient or authorized representative who has indicated his/her understanding and acceptance.     Dental advisory given  Plan Discussed with: CRNA  Anesthesia Plan Comments:        Anesthesia Quick Evaluation

## 2020-05-31 ENCOUNTER — Encounter (HOSPITAL_COMMUNITY): Payer: Self-pay | Admitting: General Surgery

## 2020-05-31 LAB — CBC
HCT: 37.7 % (ref 36.0–46.0)
Hemoglobin: 12.7 g/dL (ref 12.0–15.0)
MCH: 31.5 pg (ref 26.0–34.0)
MCHC: 33.7 g/dL (ref 30.0–36.0)
MCV: 93.5 fL (ref 80.0–100.0)
Platelets: 350 10*3/uL (ref 150–400)
RBC: 4.03 MIL/uL (ref 3.87–5.11)
RDW: 13.2 % (ref 11.5–15.5)
WBC: 12.2 10*3/uL — ABNORMAL HIGH (ref 4.0–10.5)
nRBC: 0 % (ref 0.0–0.2)

## 2020-05-31 LAB — BASIC METABOLIC PANEL
Anion gap: 7 (ref 5–15)
BUN: <5 mg/dL — ABNORMAL LOW (ref 8–23)
CO2: 24 mmol/L (ref 22–32)
Calcium: 7.9 mg/dL — ABNORMAL LOW (ref 8.9–10.3)
Chloride: 104 mmol/L (ref 98–111)
Creatinine, Ser: 0.7 mg/dL (ref 0.44–1.00)
GFR calc Af Amer: >60 mL/min (ref >60)
GFR calc non Af Amer: >60 mL/min (ref >60)
Glucose, Bld: 133 mg/dL — ABNORMAL HIGH (ref 70–99)
Potassium: 4.9 mmol/L (ref 3.5–5.1)
Sodium: 135 mmol/L (ref 135–145)

## 2020-05-31 MED ORDER — LACTATED RINGERS IV SOLN: INTRAVENOUS | Status: DC

## 2020-05-31 MED ORDER — ENOXAPARIN SODIUM 40 MG/0.4ML ~~LOC~~ SOLN
40.0000 mg | SUBCUTANEOUS | Status: DC
  Administered 2020-05-31 – 2020-06-02 (×3): 40 mg via SUBCUTANEOUS
  Filled 2020-05-31 (×3): qty 0.4

## 2020-05-31 NOTE — Evaluation (Signed)
Physical Therapy Evaluation Patient Details Name: Kathy Robinson MRN: [id number] DOB: 06/13/1941 Today's Date: 05/31/2020   History of Present Illness  Patient is 79 y.o. female in excellent health typically until March of this year when she noted some Lt LQ discomfort with bowel movements.  In June she noted early satiety and some increased Lt LQ vague discomfort.  Patient went to see her PCP on June 22 and a colonoscopy was ordered.  Last week patient developed increasing abdominal pain associated with vomiting.  Patient went to her PCP, CT performed and revealed a new large colonic mass.  Patient was discharged home with anti-nausea medicines however was unable to keep any food or medicines down.  She had been told to go to the ED for IV fluids if vomiting persisted.  Patient presented 05/26/20 for IV fluids and intractable vomiting. In hospital CT which revealed a masslike circumferential thickening of the descending colon consistent with malignancy associated high-grade colonic obstruction as well as microperforation but no drainable abscess. There was also retroperitoneal adenopathy. She was admitted by general surgery but developed AFib with RVR for which diltiazem infusion was started and cardiology consulted. Fortunately, she's converted to NSR. She is now s/p open left colectomy with colorectal anastamosis performed on 05/30/2020.     Clinical Impression  Kathy Robinson is 79 y.o. female admitted with above HPI and diagnosis. Patient is currently limited by functional impairments below (see PT problem list). Patient lives alone and is independent at baseline. Patient currently requires min assist for bed mobility, transfers, and gait and was limited by abdominal pain this date. Patient will benefit from continued skilled PT interventions to address impairments and progress independence with mobility, recommending HHPT at this time, pt may progress in Acute setting and no longer require PT  follow- up, will update as pt progressed. Acute PT will follow and progress as able.     Follow Up Recommendations Home health PT    Equipment Recommendations  None recommended by PT    Recommendations for Other Services       Precautions / Restrictions Precautions Precautions: Fall Precaution Comments: JP drail Rt LQ Restrictions Weight Bearing Restrictions: No      Mobility  Bed Mobility Overal bed mobility: Needs Assistance Bed Mobility: Supine to Sit;Sit to Supine     Supine to sit: Min assist Sit to supine: Min assist   General bed mobility comments: cues for sequencing log roll technique. Assist required to bring LE's off EOB and raise trunk. Assist to control trunk lowering and bring LE's back into bed.  Transfers Overall transfer level: Needs assistance Equipment used: None;1 person hand held assist Transfers: Sit to/from Stand Sit to Stand: Min assist         General transfer comment: cues for technique to rise, pt using 1 UE for power up from EOB and use of grab bar for power up from toilet. Pt slow to rise due to discomfort.  Ambulation/Gait Ambulation/Gait assistance: Min assist Gait Distance (Feet): 40 Feet (1x12', 1x28') Assistive device: IV Pole Gait Pattern/deviations: Step-through pattern;Decreased step length - right;Decreased step length - left;Decreased stride length;Trunk flexed Gait velocity: fair   General Gait Details: cues for safe managment of IV pole for gait, pt with flexed posture and no arm swing due to applying presure with Lt UE to abdomed for pain control. no overt LOB noted. pt remained at 93% or greater on RA and HR stable between 80-100 bpm during gait.  Stairs  Wheelchair Mobility    Modified Rankin (Stroke Patients Only)       Balance Overall balance assessment: Mild deficits observed, not formally tested            Pertinent Vitals/Pain Pain Assessment: Faces Faces Pain Scale: Hurts even  more Pain Location: Abdomen Pain Descriptors / Indicators: Discomfort;Grimacing;Guarding Pain Intervention(s): Limited activity within patient's tolerance;Monitored during session;Repositioned    Home Living Family/patient expects to be discharged to:: Private residence Living Arrangements: Alone Available Help at Discharge: Family Type of Home: House Home Access: Stairs to enter Entrance Stairs-Rails: Left;Right;Can reach both Entrance Stairs-Number of Steps: 3 Home Layout: Two level;Full bath on main level;Able to live on main level with bedroom/bathroom;Bed/bath upstairs   Additional Comments: Pt's sister Arbie Cookey will stay if needed    Prior Function Level of Independence: Independent         Comments: retired Engineer, technical sales Dominance   Dominant Hand: Right    Extremity/Trunk Assessment   Upper Extremity Assessment Upper Extremity Assessment: Overall WFL for tasks assessed    Lower Extremity Assessment Lower Extremity Assessment: Overall WFL for tasks assessed    Cervical / Trunk Assessment Cervical / Trunk Assessment: Normal;Other exceptions Cervical / Trunk Exceptions: pt with flexed posture due to abdominal pain  Communication   Communication: No difficulties  Cognition Arousal/Alertness: Awake/alert Behavior During Therapy: WFL for tasks assessed/performed Overall Cognitive Status: Within Functional Limits for tasks assessed         General Comments      Exercises     Assessment/Plan    PT Assessment Patient needs continued PT services  PT Problem List Decreased strength;Decreased balance;Decreased activity tolerance;Decreased mobility;Decreased knowledge of use of DME;Pain       PT Treatment Interventions DME instruction;Gait training;Stair training;Functional mobility training;Therapeutic activities;Therapeutic exercise;Balance training;Patient/family education    PT Goals (Current goals can be found in the Care Plan section)  Acute Rehab PT  Goals Patient Stated Goal: to get back to normal exercise PT Goal Formulation: With patient Time For Goal Achievement: 06/14/20 Potential to Achieve Goals: Good    Frequency Min 3X/week    AM-PAC PT "6 Clicks" Mobility  Outcome Measure Help needed turning from your back to your side while in a flat bed without using bedrails?: A Little Help needed moving from lying on your back to sitting on the side of a flat bed without using bedrails?: A Little Help needed moving to and from a bed to a chair (including a wheelchair)?: A Little Help needed standing up from a chair using your arms (e.g., wheelchair or bedside chair)?: A Little Help needed to walk in hospital room?: A Little Help needed climbing 3-5 steps with a railing? : A Little 6 Click Score: 18    End of Session Equipment Utilized During Treatment: Gait belt Activity Tolerance: Patient tolerated treatment well Patient left: in bed;with call bell/phone within reach;with family/visitor present Nurse Communication: Mobility status;Other (comment) (abdominal binder for pain if MD/surgeon approves.) PT Visit Diagnosis: Difficulty in walking, not elsewhere classified (R26.2);Pain Pain - part of body:  (abdomen)    Time: 6948-5462 PT Time Calculation (min) (ACUTE ONLY): 33 min   Charges:   PT Evaluation $PT Eval Low Complexity: 1 Low PT Treatments $Therapeutic Activity: 8-22 mins        Verner Mould, DPT Acute Rehabilitation Services  Office [phone] Pager [phone]  05/31/2020 4:27 PM

## 2020-05-31 NOTE — Progress Notes (Signed)
[facility] Progress Note  1 Day Post-Op  Subjective: Patient reports pain is well controlled. She was concerned about drainage from JP overnight. Denies nausea. No flatus or BM yet. Patient's sister is at bedside this AM.   Objective: Vital signs in last 24 hours: Temp:  [95.2 F (35.1 C)-98 F (36.7 C)] 98 F (36.7 C) (07/13 0446) Pulse Rate:  [60-78] 75 (07/13 0446) Resp:  [14-19] 19 (07/13 0446) BP: (100-127)/(60-83) 100/63 (07/13 0446) SpO2:  [95 %-100 %] 97 % (07/13 0446) Weight:  [65.3 kg] 65.3 kg (07/12 0924) Last BM Date:  (pt reports last BM last week)  Intake/Output from previous day: 07/12 0701 - 07/13 0700 In: 2466.9 [P.O.:220; I.V.:2146.9; IV Piggyback:100] Out: 1420 [Urine:925; Drains:395; Blood:100] Intake/Output this shift: Total I/O In: 85 [P.O.:237] Out: 24 [Drains:60]  PE: General: pleasant, WD, WN female who is laying in bed in NAD Heart: regular, rate, and rhythm.  Normal s1,s2. No obvious murmurs, gallops, or rubs noted.  Palpable radial and pedal pulses bilaterally Lungs: CTAB, no wheezes, rhonchi, or rales noted.  Respiratory effort nonlabored Abd: soft, appropriately ttp, ND, +BS, honeycomb dressing over incision with small amount bloody drainage inferiorly and medially, JP with SS fluid MS: all 4 extremities are symmetrical with no cyanosis, clubbing, or edema.    Lab Results:  Recent Labs    05/30/20 2122 05/31/20 0410  WBC 15.4* 12.2*  HGB 13.0 12.7  HCT 39.2 37.7  PLT 338 350   BMET Recent Labs    05/30/20 0417 05/31/20 0410  NA 137 135  K 3.7 4.9  CL 108 104  CO2 23 24  GLUCOSE 115* 133*  BUN <5* <5*  CREATININE 0.84 0.70  CALCIUM 7.9* 7.9*   PT/INR Recent Labs    05/30/20 2122  LABPROT 13.5  INR 1.1   CMP     Component Value Date/Time   NA 135 05/31/2020 0410   K 4.9 05/31/2020 0410   CL 104 05/31/2020 0410   CO2 24 05/31/2020 0410   GLUCOSE 133 (H) 05/31/2020 0410   BUN <5 (L) 05/31/2020 0410    CREATININE 0.70 05/31/2020 0410   CALCIUM 7.9 (L) 05/31/2020 0410   PROT 8.5 (H) 05/26/2020 1745   ALBUMIN 4.2 05/26/2020 1745   AST 22 05/26/2020 1745   ALT 16 05/26/2020 1745   ALKPHOS 60 05/26/2020 1745   BILITOT 0.7 05/26/2020 1745   GFRNONAA >60 05/31/2020 0410   GFRAA >60 05/31/2020 0410   Lipase     Component Value Date/Time   LIPASE 18 05/26/2020 1745       Studies/Results: No results found.  Anti-infectives: Anti-infectives (From admission, onward)   Start     Dose/Rate Route Frequency Ordered Stop   05/27/20 0100  cefoTEtan (CEFOTAN) 2 g in sodium chloride 0.9 % 100 mL IVPB  Status:  Discontinued        2 g 200 mL/hr over 30 Minutes Intravenous Every 12 hours 05/26/20 2354 05/30/20 1430       Assessment/Plan LBO secondary to colon mass S/p left colectomy with colorectal anastomosis 05/30/20 Dr. Kieth Brightly - POD#1 - some concern over bloody drainage in JP overnight, fluid is SS this AM and hgb stable, output was 395 cc in 24h (suspect a lot of this is just from irrigation intra-op) - pathology pending - patient tol clears but no bowel function yet - continue CLD until bowel function returns - mobilize today - d/c foley today  FEN: CLD, decrease IVF VTE: SCDs,  ok to resume lovenox ID: [email or web address] 7/9>7/12 Follow up: Dr. Kieth Brightly  LOS: 5 days    Norm Parcel , [facility] 05/31/2020, 9:18 AM Please see Amion for pager number during day hours 7:00am-4:30pm

## 2020-05-31 NOTE — Progress Notes (Signed)
J/P drainage of 305 mL between 1-2 hours. Spoke with Dr. Harlow Asa, new orders to D/C Lovenox, STAT CBC, PT/INR, bed rest and continue to document output. Will continue to monitor the patient.

## 2020-05-31 NOTE — Care Management Important Message (Signed)
Important Message  Patient Details IM Letter given to Roque Lias SW Case Manager to present to the Patient Name: Kathy Robinson MRN: [id number] Date of Birth: [birth year]   Medicare Important Message Given:  Yes     Kerin Salen 05/31/2020, 9:59 AM

## 2020-05-31 NOTE — Progress Notes (Addendum)
PROGRESS NOTE  NAEVIA UNTERREINER  QTM:[id number] DOB: September 06, 1941 DOA: 05/26/2020 PCP: Aretta Nip, MD   Brief Narrative: REBECKA OELKERS is a healthy 79 y.o. female with presented to the ED with intractable vomiting and colonic mass seen on CT as an outpatient. underwent a CT which revealed a masslike circumferential thickening of the descending colon consistent with malignancy associated high-grade colonic obstruction as well as microperforation but no drainable abscess. There was also retroperitoneal adenopathy. She was admitted by general surgery but developed AFib with RVR for which diltiazem infusion was started and cardiology consulted. Fortunately, she's converted to NSR. Open left colectomy with colorectal anastamosis was performed 05/30/2020.   Assessment & Plan: Principal Problem:   Colonic obstruction (HCC) Active Problems:   Colonic mass   Atrial fibrillation with RVR (HCC)  Colon mass with obstruction and microperforation, retroperitoneal lymphadenopathy: s/p colectomy w/reanastamosis 7/12.  - Postoperative care per surgery.  - Note postoperative bleeding in JP drain, though hgb paradoxically rising, possibly hemoconcentration, though no vital sign changes. W/K rising, will change to LR IVF.  - CEA 13.4. Will need oncology evaluation once pathology available. - Incentive spirometry. Wean oxygen (no documented hypoxemia)  New onset paroxysmal atrial fibrillation with RVR: RVR resolved, back into NSR after discontinuing diltiazem infusion. No significant structural heart disease on echocardiogram. TSH 4.177.  - Cardiology consulted. No further risk assessment recommended preoperatively.  - Durably converted to sinus rhythm. Avoiding starting new BBperioperatively, could consider amiodarone if converts back to AFib w/elevated rate.  - Deferring anticoagulation for now.   Hypokalemia:  - At ULN, will stop supplementation for now, continue monitoring.  Troponin  elevation: Due to demand myocardial ischemia in setting of RVR. No chest pain.  - Per cardiology, no further work up planned.   Hyperlipidemia: HDL 48, LDL 86. - Continue statin.   Prediabetes: HbA1c 5.8%. - PCP follow up recommended.   DVT prophylaxis: Lovenox stopped, will order SCDs Code Status: Full Family Communication: None at bedside this AM Disposition Plan:  Status is: Inpatient   Remains inpatient appropriate because:Inpatient level of care appropriate due to severity of illness   Dispo: The patient is from: Home              Anticipated d/c is to: Home              Anticipated d/c date is: 2 days or per surgery              Patient currently is not medically stable to d/c.  Consultants:   Surgery  GI  Cardiology  Procedures:   Colectomy 7/12  Antimicrobials:  Cefotetan    Subjective: Worried about bleeding into JP but noted no other bleeding. No bruising, denies chest pain, dyspnea, palpitations, lightheadedness.   Objective: Vitals:   05/30/20 1400 05/30/20 1438 05/30/20 2039 05/31/20 0446  BP: 119/60 127/65 111/75 100/63  Pulse: 68 67 78 75  Resp: 19 18 19 19   Temp:  97.7 F (36.5 C) 97.8 F (36.6 C) 98 F (36.7 C)  TempSrc:  Oral    SpO2: 100% 99% 96% 97%  Weight:      Height:        Intake/Output Summary (Last 24 hours) at 05/31/2020 0913 Last data filed at 05/31/2020 0803 Gross per 24 hour  Intake 2703.93 ml  Output 1480 ml  Net 1223.93 ml   Filed Weights   05/26/20 2021 05/27/20 1115 05/30/20 0924  Weight: 63.5 kg 65.3 kg 65.3 kg  Gen: 79 y.o. female in no distress Pulm: Nonlabored breathing supplemental oxygen. Clear. CV: Regular rate and rhythm. No murmur, rub, or gallop. No JVD, no dependent edema. GI: Abdomen soft, quite tender diffusely, midline incision with dried blood, no dehiscence. JP from right abdomen draining thin sanguinous fluid, no exudate. Active bowel sounds.  Ext: Warm, no deformities Skin: No other rashes,  lesions or ulcers on visualized skin aside from abdominal sites above Neuro: Alert and oriented. No focal neurological deficits. Psych: Judgement and insight appear fair. Mood euthymic & affect congruent. Behavior is appropriate.    Data Reviewed: I have personally reviewed following labs and imaging studies  CBC: Recent Labs  Lab 05/28/20 0235 05/29/20 0323 05/30/20 0417 05/30/20 2122 05/31/20 0410  WBC 11.7* 8.8 7.6 15.4* 12.2*  HGB 10.9* 11.2* 11.8* 13.0 12.7  HCT 33.7* 33.5* 35.1* 39.2 37.7  MCV 98.0 94.6 93.1 94.2 93.5  PLT 325 306 324 338 784   Basic Metabolic Panel: Recent Labs  Lab 05/26/20 1745 05/28/20 0235 05/29/20 0323 05/30/20 0417 05/31/20 0410  NA 139 139 137 137 135  K 3.9 3.5 3.2* 3.7 4.9  CL 97* 111 107 108 104  CO2 28 19* 22 23 24   GLUCOSE 128* 90 127* 115* 133*  BUN 19 19 8  <5* <5*  CREATININE 0.80 0.75 0.72 0.84 0.70  CALCIUM 9.9 7.3* 7.6* 7.9* 7.9*  MG  --   --  1.9 2.0  --    GFR: Estimated Creatinine Clearance: 55.1 mL/min (by C-G formula based on SCr of 0.7 mg/dL). Liver Function Tests: Recent Labs  Lab 05/26/20 1745  AST 22  ALT 16  ALKPHOS 60  BILITOT 0.7  PROT 8.5*  ALBUMIN 4.2   Recent Labs  Lab 05/26/20 1745  LIPASE 18   No results for input(s): AMMONIA in the last 168 hours. Coagulation Profile: Recent Labs  Lab 05/30/20 2122  INR 1.1   Cardiac Enzymes: No results for input(s): CKTOTAL, CKMB, CKMBINDEX, TROPONINI in the last 168 hours. BNP (last 3 results) No results for input(s): PROBNP in the last 8760 hours. HbA1C: No results for input(s): HGBA1C in the last 72 hours. CBG: No results for input(s): GLUCAP in the last 168 hours. Lipid Profile: No results for input(s): CHOL, HDL, LDLCALC, TRIG, CHOLHDL, LDLDIRECT in the last 72 hours. Thyroid Function Tests: Recent Labs    05/29/20 0323  TSH 4.177   Anemia Panel: No results for input(s): VITAMINB12, FOLATE, FERRITIN, TIBC, IRON, RETICCTPCT in the last 72  hours. Urine analysis:    Component Value Date/Time   COLORURINE YELLOW 05/26/2020 1635   APPEARANCEUR CLEAR 05/26/2020 1635   LABSPEC 1.025 05/26/2020 1635   PHURINE 5.0 05/26/2020 1635   GLUCOSEU NEGATIVE 05/26/2020 1635   HGBUR MODERATE (A) 05/26/2020 1635   BILIRUBINUR NEGATIVE 05/26/2020 1635   KETONESUR 80 (A) 05/26/2020 1635   PROTEINUR 30 (A) 05/26/2020 1635   NITRITE NEGATIVE 05/26/2020 1635   LEUKOCYTESUR NEGATIVE 05/26/2020 1635   Recent Results (from the past 240 hour(s))  SARS Coronavirus 2 by RT PCR (hospital order, performed in [facility] hospital lab) Nasopharyngeal Nasopharyngeal Swab     Status: None   Collection Time: 05/26/20  9:01 PM   Specimen: Nasopharyngeal Swab  Result Value Ref Range Status   SARS Coronavirus 2 NEGATIVE NEGATIVE Final    Comment: (NOTE) SARS-CoV-2 target nucleic acids are NOT DETECTED.  The SARS-CoV-2 RNA is generally detectable in upper and lower respiratory specimens during the acute phase of infection. The  lowest concentration of SARS-CoV-2 viral copies this assay can detect is 250 copies / mL. A negative result does not preclude SARS-CoV-2 infection and should not be used as the sole basis for treatment or other patient management decisions.  A negative result may occur with improper specimen collection / handling, submission of specimen other than nasopharyngeal swab, presence of viral mutation(s) within the areas targeted by this assay, and inadequate number of viral copies (<250 copies / mL). A negative result must be combined with clinical observations, patient history, and epidemiological information.  Fact Sheet for Patients:   StrictlyIdeas.no  Fact Sheet for Healthcare Providers: BankingDealers.co.za  This test is not yet approved or  cleared by the [place] FDA and has been authorized for detection and/or diagnosis of SARS-CoV-2 by FDA under an Emergency Use  Authorization (EUA).  This EUA will remain in effect (meaning this test can be used) for the duration of the COVID-19 declaration under Section 564(b)(1) of the Act, 21 U.S.C. section 360bbb-3(b)(1), unless the authorization is terminated or revoked sooner.  Performed at [facility], [place] [address] [place], [place] [address]   MRSA PCR Screening     Status: None   Collection Time: 05/27/20 11:46 AM   Specimen: Nasal Mucosa; Nasopharyngeal  Result Value Ref Range Status   MRSA by PCR NEGATIVE NEGATIVE Final    Comment:        The GeneXpert MRSA Assay (FDA approved for NASAL specimens only), is one component of a comprehensive MRSA colonization surveillance program. It is not intended to diagnose MRSA infection nor to guide or monitor treatment for MRSA infections. Performed at [facility], [place] [address], [place], [place] [address]       Radiology Studies: No results found.  Scheduled Meds: . acetaminophen  1,000 mg Oral Q6H  . Chlorhexidine Gluconate Cloth  6 each Topical Daily  . feeding supplement  1 Container Oral TID BM  . melatonin  3 mg Oral QHS  . pravastatin  20 mg Oral Daily   Continuous Infusions: . lactated ringers 100 mL/hr at 05/31/20 0803     LOS: 5 days   Time spent: 25 minutes.  Patrecia Pour, MD [facility] [email or web address] 05/31/2020, 9:13 AM

## 2020-05-31 NOTE — Anesthesia Postprocedure Evaluation (Signed)
Anesthesia Post Note  Patient: Kathy Robinson  Procedure(s) Performed: OPEN PARTIAL COLECTOMY (N/A Abdomen)     Patient location during evaluation: PACU Anesthesia Type: General Level of consciousness: awake and sedated Pain management: pain level controlled Vital Signs Assessment: post-procedure vital signs reviewed and stable Respiratory status: spontaneous breathing Cardiovascular status: stable Postop Assessment: no apparent nausea or vomiting Anesthetic complications: no   No complications documented.  Last Vitals:  Vitals:   05/31/20 0446 05/31/20 1346  BP: 100/63 117/65  Pulse: 75 79  Resp: 19 15  Temp: 36.7 C 36.9 C  SpO2: 97% 94%    Last Pain:  Vitals:   05/31/20 1352  TempSrc:   PainSc: 5                  John F Salome Arnt

## 2020-06-01 LAB — BASIC METABOLIC PANEL
Anion gap: 10 (ref 5–15)
BUN: 7 mg/dL — ABNORMAL LOW (ref 8–23)
CO2: 23 mmol/L (ref 22–32)
Calcium: 8.4 mg/dL — ABNORMAL LOW (ref 8.9–10.3)
Chloride: 103 mmol/L (ref 98–111)
Creatinine, Ser: 0.74 mg/dL (ref 0.44–1.00)
GFR calc Af Amer: >60 mL/min (ref >60)
GFR calc non Af Amer: >60 mL/min (ref >60)
Glucose, Bld: 99 mg/dL (ref 70–99)
Potassium: 4.4 mmol/L (ref 3.5–5.1)
Sodium: 136 mmol/L (ref 135–145)

## 2020-06-01 LAB — CBC
HCT: 35.8 % — ABNORMAL LOW (ref 36.0–46.0)
Hemoglobin: 12 g/dL (ref 12.0–15.0)
MCH: 31.5 pg (ref 26.0–34.0)
MCHC: 33.5 g/dL (ref 30.0–36.0)
MCV: 94 fL (ref 80.0–100.0)
Platelets: 381 10*3/uL (ref 150–400)
RBC: 3.81 MIL/uL — ABNORMAL LOW (ref 3.87–5.11)
RDW: 13.7 % (ref 11.5–15.5)
WBC: 14.5 10*3/uL — ABNORMAL HIGH (ref 4.0–10.5)
nRBC: 0 % (ref 0.0–0.2)

## 2020-06-01 NOTE — Progress Notes (Signed)
PROGRESS NOTE  Kathy Robinson YPP:[id number] DOB: 1940/11/22 DOA: 05/26/2020 PCP: Aretta Nip, MD  Brief History   Kathy Robinson is a healthy 79 y.o. female with presented to the ED with intractable vomiting and colonic mass seen on CT as an outpatient. underwent a CT which revealed a masslike circumferential thickening of the descending colon consistent with malignancy associated high-grade colonic obstruction as well as microperforation but no drainable abscess. There was also retroperitoneal adenopathy. She was admitted by general surgery but developed AFib with RVR for which diltiazem infusion was started and cardiology consulted. Fortunately, she's converted to NSR. Open left colectomy with colorectal anastamosis was performed 05/30/2020.   Consultants  . Gastroenterology . Cardiology . General Surgery  Procedures  . Open partial colectomy  Antibiotics   Anti-infectives (From admission, onward)   Start     Dose/Rate Route Frequency Ordered Stop   05/27/20 0100  cefoTEtan (CEFOTAN) 2 g in sodium chloride 0.9 % 100 mL IVPB  Status:  Discontinued        2 g 200 mL/hr over 30 Minutes Intravenous Every 12 hours 05/26/20 2354 05/30/20 1430    .   Subjective  The patient is sitting up in a chair at bedside. No flatus or BM yet. She states that she is feeling better each day.  Objective   Vitals:  Vitals:   06/01/20 0430 06/01/20 1348  BP: 125/61 116/61  Pulse: 79 83  Resp: 19 16  Temp: 98.2 F (36.8 C) 98.8 F (37.1 C)  SpO2: 96% 98%   Exam:  Constitutional:  . The patient is awake, alert, and oriented x 3. No acute distress. Respiratory:  . No increased work of breathing. . No wheezes, rales, or rhonchi . No tactile fremitus Cardiovascular:  . Regular rate and rhythm . No murmurs, ectopy, or gallups. . No lateral PMI. No thrills. Abdomen:  . Abdomen is soft, somewhat tender and distended . No hernias, masses, or organomegaly . Hypoactive bowel  sounds.  Musculoskeletal:  . No cyanosis, clubbing, or edema Skin:  . No rashes, lesions, ulcers . palpation of skin: no induration or nodules Neurologic:  . CN 2-12 intact . Sensation all 4 extremities intact Psychiatric:  . Mental status o Mood, affect appropriate o Orientation to person, place, time  . judgment and insight appear intact   I have personally reviewed the following:   Today's Data  . Vitals, CBC, BMP  Micro Data  . MRSA by PCR negative. .  Scheduled Meds: . acetaminophen  1,000 mg Oral Q6H  . Chlorhexidine Gluconate Cloth  6 each Topical Daily  . enoxaparin (LOVENOX) injection  40 mg Subcutaneous Q24H  . feeding supplement  1 Container Oral TID BM  . melatonin  3 mg Oral QHS  . pravastatin  20 mg Oral Daily   Continuous Infusions: . lactated ringers Stopped (06/01/20 1400)    Principal Problem:   Colonic obstruction (HCC) Active Problems:   Colonic mass   Atrial fibrillation with RVR (HCC)   LOS: 6 days   A & P    Colon mass with obstruction and microperforation, retroperitoneal lymphadenopathy: s/p colectomy w/reanastamosis 7/12. Post-operative care per surgery. Note postoperative bleeding in JP drain, though hgb paradoxically rising, possibly hemoconcentration, though no vital sign changes. W/K rising, will change to LR IVF. CEA 13.4. Will need oncology evaluation once pathology available. Incentive spirometry. Wean oxygen (no documented hypoxemia)  New onset paroxysmal atrial fibrillation with RVR: RVR resolved, back into NSR after  discontinuing diltiazem infusion. No significant structural heart disease on echocardiogram. TSH 4.177. Cardiology consulted. No further risk assessment recommended preoperatively. Durably converted to sinus rhythm. Avoiding starting new BBperioperatively, could consider amiodarone if converts back to AFib w/elevated rate. Deferring anticoagulation for now.   Hypokalemia: At ULN, will stop supplementation for now,  continue monitoring.  Troponin elevation: Due to demand myocardial ischemia in setting of RVR. No chest pain. Per cardiology, no further work up planned.   Hyperlipidemia: HDL 48, LDL 86. Continue statin.   Prediabetes: HbA1c 5.8%. PCP follow up recommended.   I have seen and examined this patient myself. I have spent 32 minutes in his evaluation and care.  DVT prophylaxis: Lovenox stopped, will order SCDs Code Status: Full Family Communication: None at bedside this AM Disposition Plan:  Status is: Inpatient   Remains inpatient appropriate because:Inpatient level of care appropriate due to severity of illness   Dispo: The patient is from: Home  Anticipated d/c is to: Home  Anticipated d/c date is: 2 days or per surgery  Patient currently is not medically stable to d/c.  Kathy Crumby, DO [facility] Direct contact: see [email or web address]  7PM-7AM contact night coverage as above 06/01/2020, 4:49 PM  LOS: 6 days

## 2020-06-01 NOTE — Progress Notes (Signed)
[facility] Progress Note  2 Days Post-Op  Subjective: Patient reports pain is well controlled. Some occasional nausea but not associated with drinking. Ambulated once in the halls already this AM. No flatus or BM yet.   Objective: Vital signs in last 24 hours: Temp:  [98.2 F (36.8 C)-98.5 F (36.9 C)] 98.2 F (36.8 C) (07/14 0430) Pulse Rate:  [76-79] 79 (07/14 0430) Resp:  [15-19] 19 (07/14 0430) BP: (111-125)/(60-65) 125/61 (07/14 0430) SpO2:  [94 %-96 %] 96 % (07/14 0430) Last BM Date: 05/29/20  Intake/Output from previous day: 07/13 0701 - 07/14 0700 In: 2232.5 [P.O.:1014; I.V.:1218.5] Out: 405 [Urine:115; Drains:290] Intake/Output this shift: No intake/output data recorded.  PE: General: pleasant, WD, WN female who is laying in bed in NAD Heart: regular, rate, and rhythm.  Normal s1,s2. No obvious murmurs, gallops, or rubs noted.  Palpable radial and pedal pulses bilaterally Lungs: CTAB, no wheezes, rhonchi, or rales noted.  Respiratory effort nonlabored Abd: soft, appropriately ttp, ND, +BS, honeycomb dressing over incision with small amount bloody drainage inferiorly and medially, JP with SS fluid MS: all 4 extremities are symmetrical with no cyanosis, clubbing, or edema.   Lab Results:  Recent Labs    05/31/20 0410 06/01/20 0406  WBC 12.2* 14.5*  HGB 12.7 12.0  HCT 37.7 35.8*  PLT 350 381   BMET Recent Labs    05/31/20 0410 06/01/20 0406  NA 135 136  K 4.9 4.4  CL 104 103  CO2 24 23  GLUCOSE 133* 99  BUN <5* 7*  CREATININE 0.70 0.74  CALCIUM 7.9* 8.4*   PT/INR Recent Labs    05/30/20 2122  LABPROT 13.5  INR 1.1   CMP     Component Value Date/Time   NA 136 06/01/2020 0406   K 4.4 06/01/2020 0406   CL 103 06/01/2020 0406   CO2 23 06/01/2020 0406   GLUCOSE 99 06/01/2020 0406   BUN 7 (L) 06/01/2020 0406   CREATININE 0.74 06/01/2020 0406   CALCIUM 8.4 (L) 06/01/2020 0406   PROT 8.5 (H) 05/26/2020 1745   ALBUMIN 4.2  05/26/2020 1745   AST 22 05/26/2020 1745   ALT 16 05/26/2020 1745   ALKPHOS 60 05/26/2020 1745   BILITOT 0.7 05/26/2020 1745   GFRNONAA >60 06/01/2020 0406   GFRAA >60 06/01/2020 0406   Lipase     Component Value Date/Time   LIPASE 18 05/26/2020 1745       Studies/Results: No results found.  Anti-infectives: Anti-infectives (From admission, onward)   Start     Dose/Rate Route Frequency Ordered Stop   05/27/20 0100  cefoTEtan (CEFOTAN) 2 g in sodium chloride 0.9 % 100 mL IVPB  Status:  Discontinued        2 g 200 mL/hr over 30 Minutes Intravenous Every 12 hours 05/26/20 2354 05/30/20 1430       Assessment/Plan LBO secondary to colon mass S/p left colectomy with colorectal anastomosis 05/30/20 Dr. Kieth Brightly - POD#2 - JP drainage trending down - output was 290 cc in 24h , fluid is SS this AM and hgb stable - pathology pending - patient tol clears but no bowel function yet - continue CLD until bowel function returns - mobilize today - abdominal binder for mobilization   FEN: CLD, IVF VTE: SCDs, lovenox ID: [email or web address] 7/9>7/12 Follow up: Dr. Kieth Brightly  LOS: 6 days    Norm Parcel , [facility] 06/01/2020, 8:27 AM Please see Amion for pager number during day hours  7:00am-4:30pm

## 2020-06-02 LAB — BASIC METABOLIC PANEL
Anion gap: 7 (ref 5–15)
BUN: 8 mg/dL (ref 8–23)
CO2: 26 mmol/L (ref 22–32)
Calcium: 8.4 mg/dL — ABNORMAL LOW (ref 8.9–10.3)
Chloride: 102 mmol/L (ref 98–111)
Creatinine, Ser: 0.64 mg/dL (ref 0.44–1.00)
GFR calc Af Amer: >60 mL/min (ref >60)
GFR calc non Af Amer: >60 mL/min (ref >60)
Glucose, Bld: 96 mg/dL (ref 70–99)
Potassium: 3.9 mmol/L (ref 3.5–5.1)
Sodium: 135 mmol/L (ref 135–145)

## 2020-06-02 MED ORDER — ENSURE ENLIVE PO LIQD
237.0000 mL | Freq: Two times a day (BID) | ORAL | Status: DC
  Administered 2020-06-02 – 2020-06-03 (×3): 237 mL via ORAL

## 2020-06-02 MED ORDER — POTASSIUM CHLORIDE 20 MEQ PO PACK
20.0000 meq | PACK | Freq: Once | ORAL | Status: AC
  Administered 2020-06-02: 20 meq via ORAL
  Filled 2020-06-02: qty 1

## 2020-06-02 NOTE — Progress Notes (Signed)
[facility] Progress Note  3 Days Post-Op  Subjective: NAEO. Pain controlled on tylenol and occasional tramadol. Voiding without any reported sxs. deneies flatus or BM. Having some belching. Mobilized 5 laps in the hall yesterday.  Objective: Vital signs in last 24 hours: Temp:  [98.1 F (36.7 C)-98.9 F (37.2 C)] 98.9 F (37.2 C) (07/15 0500) Pulse Rate:  [76-86] 76 (07/15 0500) Resp:  [16-19] 19 (07/14 2059) BP: (116-130)/(58-70) 130/70 (07/15 0500) SpO2:  [94 %-99 %] 94 % (07/15 0500) Last BM Date: 05/29/20  Intake/Output from previous day: 07/14 0701 - 07/15 0700 In: 2754.4 [P.O.:1800; I.V.:954.4] Out: 150 [Drains:150] Intake/Output this shift: No intake/output data recorded.  PE: General: pleasant, WD, WN female who is laying in bed in NAD Heart: regular, rate, and rhythm.  Normal s1,s2. No obvious murmurs, gallops, or rubs noted.  Palpable radial and pedal pulses bilaterally Lungs: CTAB, no wheezes, rhonchi, or rales noted.  Respiratory effort nonlabored Abd: soft, appropriately ttp, ND, +BS, honeycomb dressing over incision with small amount bloody drainage inferiorly and medially, JP with SS fluid MS: all 4 extremities are symmetrical with no cyanosis, clubbing, or edema.   Lab Results:  Recent Labs    05/31/20 0410 06/01/20 0406  WBC 12.2* 14.5*  HGB 12.7 12.0  HCT 37.7 35.8*  PLT 350 381   BMET Recent Labs    06/01/20 0406 06/02/20 0336  NA 136 135  K 4.4 3.9  CL 103 102  CO2 23 26  GLUCOSE 99 96  BUN 7* 8  CREATININE 0.74 0.64  CALCIUM 8.4* 8.4*   PT/INR Recent Labs    05/30/20 2122  LABPROT 13.5  INR 1.1   CMP     Component Value Date/Time   NA 135 06/02/2020 0336   K 3.9 06/02/2020 0336   CL 102 06/02/2020 0336   CO2 26 06/02/2020 0336   GLUCOSE 96 06/02/2020 0336   BUN 8 06/02/2020 0336   CREATININE 0.64 06/02/2020 0336   CALCIUM 8.4 (L) 06/02/2020 0336   PROT 8.5 (H) 05/26/2020 1745   ALBUMIN 4.2 05/26/2020 1745    AST 22 05/26/2020 1745   ALT 16 05/26/2020 1745   ALKPHOS 60 05/26/2020 1745   BILITOT 0.7 05/26/2020 1745   GFRNONAA >60 06/02/2020 0336   GFRAA >60 06/02/2020 0336   Lipase     Component Value Date/Time   LIPASE 18 05/26/2020 1745       Studies/Results: No results found.  Anti-infectives: Anti-infectives (From admission, onward)   Start     Dose/Rate Route Frequency Ordered Stop   05/27/20 0100  cefoTEtan (CEFOTAN) 2 g in sodium chloride 0.9 % 100 mL IVPB  Status:  Discontinued        2 g 200 mL/hr over 30 Minutes Intravenous Every 12 hours 05/26/20 2354 05/30/20 1430       Assessment/Plan LBO secondary to colon mass S/p left colectomy with colorectal anastomosis 05/30/20 Dr. Kieth Brightly - POD#3 - JP drainage trending down - output was 150 cc in 24h , fluid is SS - pathology pending - patient tol clears but no bowel function yet - allow FLD and await further ROBF - mobilize today - abdominal binder for mobilization  - plan to D/C honeycomb dressing tomorrow 7/16  FEN: CLD, IVF, give 20 mEq KCl PO for goal K > 4.0  VTE: SCDs, lovenox ID: [email or web address] 7/9>7/12 Follow up: Dr. Kieth Brightly   LOS: 7 days    Jill Alexanders , [facility]  06/02/2020, 8:07 AM Please see Amion for pager number during day hours 7:00am-4:30pm

## 2020-06-02 NOTE — Progress Notes (Signed)
[name] IDP:[id number] DOB: November 04, 1941 DOA: 05/26/2020 PCP: Aretta Nip, MD  Brief History   Kathy Robinson is a healthy 79 y.o. female with presented to the ED with intractable vomiting and colonic mass seen on CT as an outpatient. underwent a CT which revealed a masslike circumferential thickening of the descending colon consistent with malignancy associated high-grade colonic obstruction as well as microperforation but no drainable abscess. There was also retroperitoneal adenopathy. She was admitted by general surgery but developed AFib with RVR for which diltiazem infusion was started and cardiology consulted. Fortunately, she's converted to NSR. Open left colectomy with colorectal anastamosis was performed 05/30/2020.   Consultants  . Gastroenterology . Cardiology . General Surgery  Procedures  . Open partial colectomy  Antibiotics   Anti-infectives (From admission, onward)   Start     Dose/Rate Route Frequency Ordered Stop   05/27/20 0100  cefoTEtan (CEFOTAN) 2 g in sodium chloride 0.9 % 100 mL IVPB  Status:  Discontinued        2 g 200 mL/hr over 30 Minutes Intravenous Every 12 hours 05/26/20 2354 05/30/20 1430      Subjective  The patient is sitting up in a chair at bedside. No flatus or BM yet. She states that she does notice more rumbling in her abdomen today.  Objective   Vitals:  Vitals:   06/02/20 0500 06/02/20 1340  BP: 130/70 132/74  Pulse: 76 99  Resp:  16  Temp: 98.9 F (37.2 C) 99.6 F (37.6 C)  SpO2: 94% 97%   Exam:  Constitutional:  . The patient is awake, alert, and oriented x 3. No acute distress. Respiratory:  . No increased work of breathing. . No wheezes, rales, or rhonchi . No tactile fremitus Cardiovascular:  . Regular rate and rhythm . No murmurs, ectopy, or gallups. . No lateral PMI. No thrills. Abdomen:  . Abdomen is soft, somewhat tender and distended . No hernias, masses, or  organomegaly . Normo-active bowel sounds.  Musculoskeletal:  . No cyanosis, clubbing, or edema Skin:  . No rashes, lesions, ulcers . palpation of skin: no induration or nodules Neurologic:  . CN 2-12 intact . Sensation all 4 extremities intact Psychiatric:  . Mental status o Mood, affect appropriate o Orientation to person, place, time  . judgment and insight appear intact   I have personally reviewed the following:   Today's Data  . Vitals, BMP  Micro Data  . MRSA by PCR negative. .  Scheduled Meds: . acetaminophen  1,000 mg Oral Q6H  . Chlorhexidine Gluconate Cloth  6 each Topical Daily  . enoxaparin (LOVENOX) injection  40 mg Subcutaneous Q24H  . feeding supplement (ENSURE ENLIVE)  237 mL Oral BID BM  . melatonin  3 mg Oral QHS  . pravastatin  20 mg Oral Daily   Continuous Infusions: . lactated ringers 50 mL/hr at 06/02/20 1112    Principal Problem:   Colonic obstruction (HCC) Active Problems:   Colonic mass   Atrial fibrillation with RVR (HCC)   LOS: 7 days   A & P    Colon mass with obstruction and microperforation, retroperitoneal lymphadenopathy: s/p colectomy w/reanastamosis 7/12. Post-operative care per surgery. Note postoperative bleeding in JP drain, though hgb paradoxically rising, possibly hemoconcentration, though no vital sign changes. W/K rising, will change to LR IVF. CEA 13.4. Will need oncology evaluation once pathology available. Incentive spirometry. Wean oxygen (no documented hypoxemia)  New onset paroxysmal atrial fibrillation with RVR: RVR  resolved, back into NSR after discontinuing diltiazem infusion. No significant structural heart disease on echocardiogram. TSH 4.177. Cardiology consulted. No further risk assessment recommended preoperatively. Durably converted to sinus rhythm. Avoiding starting new BBperioperatively, could consider amiodarone if converts back to AFib w/elevated rate. Deferring anticoagulation for now.   Hypokalemia: At  ULN, will stop supplementation for now, continue monitoring.  Troponin elevation: Due to demand myocardial ischemia in setting of RVR. No chest pain. Per cardiology, no further work up planned.   Hyperlipidemia: HDL 48, LDL 86. Continue statin.   Prediabetes: HbA1c 5.8%. PCP follow up recommended.   I have seen and examined this patient myself. I have spent 30 minutes in his evaluation and care.  DVT prophylaxis: Lovenox stopped, will order SCDs Code Status: Full Family Communication: None at bedside this AM Disposition Plan:  Status is: Inpatient   Remains inpatient appropriate because:Inpatient level of care appropriate due to severity of illness   Dispo: The patient is from: Home  Anticipated d/c is to: Home  Anticipated d/c date is: 2 days or per surgery  Patient currently is not medically stable to d/c.  Taelar Gronewold, DO [facility] Direct contact: see [email or web address]  7PM-7AM contact night coverage as above 06/02/2020, 6:54 PM  LOS: 6 days

## 2020-06-03 ENCOUNTER — Telehealth: Payer: Self-pay | Admitting: Oncology

## 2020-06-03 MED ORDER — OXYCODONE HCL 5 MG PO TABS: 5.0000 mg | ORAL_TABLET | Freq: Four times a day (QID) | ORAL | 0 refills | Status: DC | PRN

## 2020-06-03 MED ORDER — ENSURE ENLIVE PO LIQD: 237.0000 mL | Freq: Two times a day (BID) | ORAL | 12 refills | Status: DC

## 2020-06-03 MED ORDER — ACETAMINOPHEN 500 MG PO TABS: 1000.0000 mg | ORAL_TABLET | Freq: Four times a day (QID) | ORAL | 0 refills | Status: DC

## 2020-06-03 NOTE — Telephone Encounter (Signed)
Received a new pt referral from Dr. Kieth Brightly for new dx of colon cancer. Kathy Robinson has been scheduled to see Dr. Benay Spice on 8/3 at 2pm. Letter mailed.

## 2020-06-03 NOTE — Progress Notes (Signed)
Patient being discharged home.  Discharge instructions and medication education provided to patient.

## 2020-06-03 NOTE — Progress Notes (Signed)
[facility] Progress Note  4 Days Post-Op  Subjective: NAEO. Pain controlled. +flatus and 3, small BMs yesterday. Tolerating FLD without nausea or emesis. Mobilizing in the hallway.   Objective: Vital signs in last 24 hours: Temp:  [98.3 F (36.8 C)-99.6 F (37.6 C)] 98.6 F (37 C) (07/16 0530) Pulse Rate:  [82-99] 82 (07/16 0530) Resp:  [16-18] 16 (07/16 0530) BP: (129-132)/(65-75) 132/65 (07/16 0530) SpO2:  [96 %-97 %] 96 % (07/16 0530) Last BM Date: 06/02/20  Intake/Output from previous day: 07/15 0701 - 07/16 0700 In: 255 [P.O.:255] Out: 160 [Drains:160] Intake/Output this shift: No intake/output data recorded.  PE: General: pleasant, WD, WN female who is laying in bed in NAD Heart: RRR Lungs:  Respiratory effort nonlabored Abd: soft, appropriately ttp, ND, +BS, honeycomb dressing removed, midline incision c/d/i with staples in place, mild erythema underneath adhesive consistent with skin irration from the tape, does not look infectious. RLQ blake drain with cloudy SS drainage in bulb  MS: all 4 extremities are symmetrical with no cyanosis, clubbing, or edema.   Lab Results:  Recent Labs    06/01/20 0406  WBC 14.5*  HGB 12.0  HCT 35.8*  PLT 381   BMET Recent Labs    06/01/20 0406 06/02/20 0336  NA 136 135  K 4.4 3.9  CL 103 102  CO2 23 26  GLUCOSE 99 96  BUN 7* 8  CREATININE 0.74 0.64  CALCIUM 8.4* 8.4*   PT/INR No results for input(s): LABPROT, INR in the last 72 hours. CMP     Component Value Date/Time   NA 135 06/02/2020 0336   K 3.9 06/02/2020 0336   CL 102 06/02/2020 0336   CO2 26 06/02/2020 0336   GLUCOSE 96 06/02/2020 0336   BUN 8 06/02/2020 0336   CREATININE 0.64 06/02/2020 0336   CALCIUM 8.4 (L) 06/02/2020 0336   PROT 8.5 (H) 05/26/2020 1745   ALBUMIN 4.2 05/26/2020 1745   AST 22 05/26/2020 1745   ALT 16 05/26/2020 1745   ALKPHOS 60 05/26/2020 1745   BILITOT 0.7 05/26/2020 1745   GFRNONAA >60 06/02/2020 0336    GFRAA >60 06/02/2020 0336   Lipase     Component Value Date/Time   LIPASE 18 05/26/2020 1745       Studies/Results: No results found.  Anti-infectives: Anti-infectives (From admission, onward)   Start     Dose/Rate Route Frequency Ordered Stop   05/27/20 0100  cefoTEtan (CEFOTAN) 2 g in sodium chloride 0.9 % 100 mL IVPB  Status:  Discontinued        2 g 200 mL/hr over 30 Minutes Intravenous Every 12 hours 05/26/20 2354 05/30/20 1430       Assessment/Plan LBO secondary to colon mass S/p left colectomy with colorectal anastomosis 05/30/20 Dr. Kieth Brightly - POD#4 - JP drainage stable- output was 160 cc in 24h , fluid is cloudy SS; will discuss timing of drain removal with MD. - pathology pending - having flatus and BMs, advance to SOFT diet  - abdominal binder for mobilization, OOB and mobilize at least 4 times daily  FEN: SOFT, saline lock IV  VTE: SCDs, lovenox ID: [email or web address] 7/9>7/12 Follow up: Dr. Kieth Brightly - patient is stable for discharge if she is able to tolerate a SOFT diet. She may D/C home this afternoon vs tomorrow. Will discuss removing drain prior to discharge with MD.   LOS: 8 days    Jill Alexanders , [facility] 06/03/2020, 7:58 AM Please see  Amion for pager number during day hours 7:00am-4:30pm

## 2020-06-03 NOTE — Discharge Instructions (Signed)
[facility]      [facility] [phone]  OPEN ABDOMINAL SURGERY: POST OP INSTRUCTIONS  Always review your discharge instruction sheet given to you by the facility where your surgery was performed.  IF YOU HAVE DISABILITY OR FAMILY LEAVE FORMS, YOU MUST BRING THEM TO THE OFFICE FOR PROCESSING.  PLEASE DO NOT GIVE THEM TO YOUR DOCTOR.  1. A prescription for pain medication may be given to you upon discharge.  Take your pain medication as prescribed, if needed.  If narcotic pain medicine is not needed, then you may take acetaminophen (Tylenol) or ibuprofen (Advil) as needed. 2. Take your usually prescribed medications unless otherwise directed. 3. If you need a refill on your pain medication, please contact your pharmacy. They will contact our office to request authorization.  Prescriptions will not be filled after 5pm or on week-ends. 4. You should follow a light diet the first few days after arrival home, such as soup and crackers, pudding, etc.unless your doctor has advised otherwise. A high-fiber, low fat diet can be resumed as tolerated.   Be sure to include lots of fluids daily. Most patients will experience some swelling and bruising on the chest and neck area.  Ice packs will help.  Swelling and bruising can take several days to resolve 5. Most patients will experience some swelling and bruising in the area of the incision. Ice pack will help. Swelling and bruising can take several days to resolve..  6. It is common to experience some constipation if taking pain medication after surgery.  Increasing fluid intake and taking a stool softener will usually help or prevent this problem from occurring.  A mild laxative (Milk of Magnesia or Miralax) should be taken according to package directions if there are no bowel movements after 48 hours. 7.  You may have steri-strips (small skin tapes) in place directly over the incision.  These strips should be left on the skin for 7-10 days.  If your  surgeon used skin glue on the incision, you may shower in 24 hours.  The glue will flake off over the next 2-3 weeks.  Any sutures or staples will be removed at the office during your follow-up visit. You may find that a light gauze bandage over your incision may keep your staples from being rubbed or pulled. You may shower and replace the bandage daily. 8. ACTIVITIES:  You may resume regular (light) daily activities beginning the next day--such as daily self-care, walking, climbing stairs--gradually increasing activities as tolerated.  You may have sexual intercourse when it is comfortable.  Refrain from any heavy lifting or straining until approved by your doctor. a. You may drive when you no longer are taking prescription pain medication, you can comfortably wear a seatbelt, and you can safely maneuver your car and apply brakes b. Return to Work: ___________________________________ 9. You should see your doctor in the office for a follow-up appointment approximately two weeks after your surgery.  Make sure that you call for this appointment within a day or two after you arrive home to insure a convenient appointment time. OTHER INSTRUCTIONS:  _____________________________________________________________ _____________________________________________________________  WHEN TO CALL YOUR DOCTOR: 1. Fever over 101.0 2. Inability to urinate 3. Nausea and/or vomiting 4. Extreme swelling or bruising 5. Continued bleeding from incision. 6. Increased pain, redness, or drainage from the incision. 7. Difficulty swallowing or breathing 8. Muscle cramping or spasms. 9. Numbness or tingling in hands or feet or around lips.  The clinic staff is available to   answer your questions during regular business hours.  Please don't hesitate to call and ask to speak to one of the nurses if you have concerns.  For further questions, please visit [email or web address]   

## 2020-06-05 NOTE — Discharge Summary (Signed)
Physician Discharge Summary  ALVERNA FAWLEY NOB:[id number] DOB: September 28, 1941 DOA: 05/26/2020  PCP: Aretta Nip, MD  Admit date: 05/26/2020 Discharge date: 06/05/2020  Recommendations for Outpatient Follow-up:  1. Discharge to home. 2. Follow up with PCP in 7-10 days. 3. Follow up with General Surgery as directed. 4. No driving until cleared by General Surgery.   Follow-up Information    Kinsinger, Arta Bruce, MD Follow up on 06/20/2020.   Specialty: General Surgery Why: 3:50pm, arrive by 3:35pm for this follow up appointment. Contact information: [address] [place] [address] [phone]        [facility], [place]. Go on 06/09/2020.   Specialty: General Surgery Why: 2:00pm, arrive by 1:30pm for staple removal.  You will need to fill out paperwork.  please bring insurance card and photo ID Contact information: [place] [phone]             Discharge Diagnoses: Principal diagnosis is #1 1. Colon Mass with obstruction and microperforation, retroperitoneal lymphadenopathy, s/p colectomy with re-anastomosis on 05/30/2020. 2. New onset paroxysmal atrial fibrillaion with RVR 3. Hypokalemia 4. Elevated troponin due to demand ischemia 5. Hyperlipidemia 6. Prediabetes  Discharge Condition: Fair  Disposition: Home  Diet recommendation: Heart healthy  Filed Weights   05/26/20 2021 05/27/20 1115 05/30/20 0924  Weight: 63.5 kg 65.3 kg 65.3 kg    History of present illness:  Kathy Robinson is an otherwise healthy 79 y.o. female is admitted with intractable vomiting and new diagnosis of colonic mass.  Patient was in her usual state of excellent health until March of this year when she noted some left lower quadrant discomfort with bowel movements.  In June of this year she noted early satiety and perhaps some increased left lower quadrant vague discomfort.  Went to see her PCP  on June 22 and a colonoscopy was ordered.  Last week patient developed increasing abdominal pain now associated with vomiting.  Patient went to see her PCP who ordered a CT which revealed a new large colonic mass.  Patient was discharged home with antinausea medicines however was unable to keep any food or medicines down.  She had been told to go to the ED for IV fluids if vomiting persisted.  Patient presented yesterday for IV fluids and intractable vomiting.  While in the ED patient was noted to incidentally be in atrial fibrillation with RVR heart rate of 168.  Patient was entirely asymptomatic during this.  Patient states she did not know that her heart was doing anything unusual.  She specifically denies any chest pain palpitations or shortness of breath.  She otherwise denies fevers chills malaise dizziness syncope presyncope or weakness.  ED Course: Patient underwent a CT which revealed a masslike circumferential thickening of the descending colon consistent with malignancy associated high-grade colonic obstruction as well as microperforation but no drainable abscess noted.  She also has retroperitoneal adenopathy.  Patient was initially admitted by general surgery however when she developed new onset A. fib with RVR general surgery requested tried hospitalist to admit.  Patient was started on with a diltiazem drip.  Hospital Course:  Shaune Malacara Pattersonis a healthy 79 y.o.femalewith presented to the ED with intractable vomiting and colonic mass seen on CT as an outpatient. underwent a CT which revealed a masslike circumferential thickening of the descending colon consistent with malignancy associated high-grade colonic obstruction as well as microperforation but no drainable abscess. There was  also retroperitoneal adenopathy. She was admitted by general surgery but developed AFib with RVR for which diltiazem infusion was started and cardiology consulted. Fortunately, she's converted to NSR.  Openleftcolectomywith colorectal anastamosiswas performed 05/30/2020.  The patient has had return of bowel function and is tolerating her diet. She is cleared for discharge by general surgery.  Today's assessment: S: The patient is resting comfortably. No new complaints. O: Vitals:  Vitals:   06/03/20 0530 06/03/20 1313  BP: 132/65 127/61  Pulse: 82 82  Resp: 16 18  Temp: 98.6 F (37 C) 98 F (36.7 C)  SpO2: 96% 97%   Constitutional:   The patient is awake, alert, and oriented x 3. No acute distress. Respiratory:   No increased work of breathing.  No wheezes, rales, or rhonchi  No tactile fremitus Cardiovascular:   Regular rate and rhythm  No murmurs, ectopy, or gallups.  No lateral PMI. No thrills. Abdomen:   Abdomen is soft, somewhat tender and distended  No hernias, masses, or organomegaly  Normo-active bowel sounds.  Musculoskeletal:   No cyanosis, clubbing, or edema Skin:   No rashes, lesions, ulcers  palpation of skin: no induration or nodules Neurologic:   CN 2-12 intact  Sensation all 4 extremities intact Psychiatric:   Mental status ? Mood, affect appropriate ? Orientation to person, place, time   judgment and insight appear intact    Discharge Instructions  Discharge Instructions    Activity as tolerated - No restrictions   Complete by: As directed    Call MD for:  persistant nausea and vomiting   Complete by: As directed    Call MD for:  severe uncontrolled pain   Complete by: As directed    Call MD for:  temperature >100.4   Complete by: As directed    Diet - low sodium heart healthy   Complete by: As directed    Discharge instructions   Complete by: As directed    Discharge to home. Follow up with PCP in 7-10 days. Follow up with General Surgery as directed. No driving until cleared by General Surgery.   Discharge wound care:   Complete by: As directed    As per general surgery   Increase activity slowly    Complete by: As directed      Allergies as of 06/03/2020      Reactions   Codeine    REACTION: nausea   Neosporin [neomycin-bacitracin Zn-polymyx] Swelling   Tape    Can use paper tape      Medication List    STOP taking these medications   CALCIUM PO     TAKE these medications   acetaminophen 500 MG tablet Commonly known as: TYLENOL Take 2 tablets (1,000 mg total) by mouth every 6 (six) hours.   feeding supplement (ENSURE ENLIVE) Liqd Take 237 mLs by mouth 2 (two) times daily between meals.   fluorouracil 5 % cream Commonly known as: EFUDEX   MULTI COMPLETE PO Take by mouth.   multivitamin-lutein Caps capsule Take 1 capsule by mouth daily.   oxyCODONE 5 MG immediate release tablet Commonly known as: Oxy IR/ROXICODONE Take 1-2 tablets (5-10 mg total) by mouth every 6 (six) hours as needed (pain).   pravastatin 20 MG tablet Commonly known as: PRAVACHOL Take 20 mg by mouth daily.   Vitamin D (Ergocalciferol) 1.25 MG (50000 UNIT) Caps capsule Commonly known as: DRISDOL Take 50,000 Units by mouth every 30 (thirty) days.  Discharge Care Instructions  (From admission, onward)         Start     Ordered   06/03/20 0000  Discharge wound care:       Comments: As per general surgery   06/03/20 1231         Allergies  Allergen Reactions  . Codeine     REACTION: nausea  . Neosporin [Neomycin-Bacitracin Zn-Polymyx] Swelling  . Tape     Can use paper tape    The results of significant diagnostics from this hospitalization (including imaging, microbiology, ancillary and laboratory) are listed below for reference.    Significant Diagnostic Studies: CT ABDOMEN PELVIS W CONTRAST  Result Date: 05/26/2020 CLINICAL DATA:  79 year old female with bloating x2 months. Nausea and vomiting. Patient is unable to eat or drink. EXAM: CT ABDOMEN AND PELVIS WITH CONTRAST TECHNIQUE: Multidetector CT imaging of the abdomen and pelvis was performed using the  standard protocol following bolus administration of intravenous contrast. CONTRAST:  19mL ISOVUE-300 IOPAMIDOL (ISOVUE-300) INJECTION 61% COMPARISON:  None. FINDINGS: Lower chest: The visualized lung bases are clear. No intra-abdominal free air. There is a small free fluid in the pelvis. Hepatobiliary: Several small hepatic hypodense lesions measure up to 7 mm in the dome of the liver, indeterminate and too small to characterize. The liver is otherwise unremarkable. No intrahepatic biliary ductal dilatation. The gallbladder is unremarkable. Pancreas: Unremarkable. No pancreatic ductal dilatation or surrounding inflammatory changes. Spleen: Normal in size without focal abnormality. Adrenals/Urinary Tract: The adrenal glands unremarkable. The kidneys, visualized ureters, and urinary bladder appear unremarkable. Stomach/Bowel: There is a long segment masslike circumferential thickening of the descending colon with associated high-grade luminal narrowing and resulting colonic obstruction. This measures at least 15 cm in length. There is stranding and nodularity of the surrounding mesocolon. A superimposed acute inflammatory process such as colitis is not excluded. There is apparent focal area of discontinuity of the colonic wall with an ill-defined 8 mm extra colonic collection containing tiny pockets of air (40/2), likely a focally contained microperforation. No drainable fluid collection or abscess. There is distention of the colon proximal to this segment. No evidence of small-bowel dilatation. The appendix is normal. Vascular/Lymphatic: Moderate aortoiliac atherosclerotic disease. The IVC is unremarkable. No portal venous gas. There is retroperitoneal adenopathy. Reproductive: The uterus is anteverted and grossly unremarkable. Other: None Musculoskeletal: Degenerative changes of the spine. No acute osseous pathology. IMPRESSION: 1. Long segment masslike circumferential thickening of the descending colon most  consistent with malignancy. There is associated high-grade luminal narrowing/stricture and resulting colonic obstruction. 2. Small focal area of colonic wall defect in the medial mid descending colon with evidence of focally contained microperforation. No drainable fluid collection or abscess. 3. Retroperitoneal adenopathy. 4. Small hepatic hypodense lesions, indeterminate and too small to characterize. 5. Aortic Atherosclerosis (ICD10-I70.0). These results were called by telephone at the time of interpretation on 05/26/2020 at 8:45 pm to provider Dr. Antony Haste, who verbally acknowledged these results. Electronically Signed   By: Anner Crete M.D.   On: 05/26/2020 20:46   DG CHEST PORT 1 VIEW  Result Date: 05/27/2020 CLINICAL DATA:  Preoperative evaluation EXAM: PORTABLE CHEST 1 VIEW COMPARISON:  None. FINDINGS: Mild interstitial prominence is probably chronic. No consolidation or edema. No pleural effusion. Cardiomediastinal contours are within normal limits with normal heart size. IMPRESSION: No acute process in the chest. Electronically Signed   By: Macy Mis M.D.   On: 05/27/2020 09:56   ECHOCARDIOGRAM COMPLETE  Result Date: 05/27/2020  ECHOCARDIOGRAM REPORT   Patient Name:   NAN MAYA Date of Exam: 05/27/2020 Medical Rec #:  [id number]           Height:       66.0 in Accession #:    [id number]          Weight:       144.0 lb Date of Birth:  1941/10/21            BSA:          1.739 m Patient Age:    89 years            BP:           130/54 mmHg Patient Gender: F                   HR:           73 bpm. Exam Location:  Inpatient Procedure: 2D Echo Indications:    atrial fibrillation  History:        Patient has no prior history of Echocardiogram examinations.                 Risk Factors:Dyslipidemia. Cancer.  Sonographer:    Jannett Celestine RDCS (AE) Referring Phys: [id number] [place]  1. Left ventricular ejection fraction, by estimation, is 55 to 60%. The left ventricle  has normal function. The left ventricle has no regional wall motion abnormalities. Left ventricular diastolic parameters were normal.  2. Right ventricular systolic function is normal. The right ventricular size is mildly enlarged.  3. Right atrial size was mildly dilated.  4. The mitral valve is normal in structure. No evidence of mitral valve regurgitation.  5. The aortic valve is grossly normal. Aortic valve regurgitation is not visualized. FINDINGS  Left Ventricle: Left ventricular ejection fraction, by estimation, is 55 to 60%. The left ventricle has normal function. The left ventricle has no regional wall motion abnormalities. The left ventricular internal cavity size was normal in size. There is  no left ventricular hypertrophy. Left ventricular diastolic parameters were normal. Right Ventricle: The right ventricular size is mildly enlarged. No increase in right ventricular wall thickness. Right ventricular systolic function is normal. Left Atrium: Left atrial size was normal in size. Right Atrium: Right atrial size was mildly dilated. Pericardium: There is no evidence of pericardial effusion. Mitral Valve: The mitral valve is normal in structure. No evidence of mitral valve regurgitation. Tricuspid Valve: The tricuspid valve is grossly normal. Tricuspid valve regurgitation is mild. Aortic Valve: The aortic valve is grossly normal. Aortic valve regurgitation is not visualized. Pulmonic Valve: The pulmonic valve was not well visualized. Pulmonic valve regurgitation is not visualized. Aorta: The aortic root is normal in size and structure. IAS/Shunts: No atrial level shunt detected by color flow Doppler.  LEFT VENTRICLE PLAX 2D LVIDd:         4.50 cm  Diastology LVIDs:         3.20 cm  LV e' lateral:   8.81 cm/s LV PW:         0.80 cm  LV E/e' lateral: 10.4 LV IVS:        0.80 cm  LV e' medial:    10.60 cm/s LVOT diam:     1.90 cm  LV E/e' medial:  8.7 LV SV:         35 LV SV Index:   20 LVOT Area:     2.84 cm   RIGHT VENTRICLE  TAPSE (M-mode): 1.8 cm LEFT ATRIUM           Index      RIGHT ATRIUM           Index LA diam:      3.50 cm 2.01 cm/m RA Area:     16.10 cm LA Vol (A4C): 17.2 ml 9.89 ml/m RA Volume:   36.50 ml  20.99 ml/m  AORTIC VALVE LVOT Vmax:   69.00 cm/s LVOT Vmean:  46.800 cm/s LVOT VTI:    0.125 m  AORTA Ao Root diam: 3.00 cm MITRAL VALVE MV Area (PHT): 2.22 cm    SHUNTS MV Decel Time: 341 msec    Systemic VTI:  0.12 m MV E velocity: 91.80 cm/s  Systemic Diam: 1.90 cm Dorris Carnes MD Electronically signed by Dorris Carnes MD Signature Date/Time: 05/27/2020/6:15:21 PM    Final     Microbiology: Recent Results (from the past 240 hour(s))  SARS Coronavirus 2 by RT PCR (hospital order, performed in [place] hospital lab) Nasopharyngeal Nasopharyngeal Swab     Status: None   Collection Time: 05/26/20  9:01 PM   Specimen: Nasopharyngeal Swab  Result Value Ref Range Status   SARS Coronavirus 2 NEGATIVE NEGATIVE Final    Comment: (NOTE) SARS-CoV-2 target nucleic acids are NOT DETECTED.  The SARS-CoV-2 RNA is generally detectable in upper and lower respiratory specimens during the acute phase of infection. The lowest concentration of SARS-CoV-2 viral copies this assay can detect is 250 copies / mL. A negative result does not preclude SARS-CoV-2 infection and should not be used as the sole basis for treatment or other patient management decisions.  A negative result may occur with improper specimen collection / handling, submission of specimen other than nasopharyngeal swab, presence of viral mutation(s) within the areas targeted by this assay, and inadequate number of viral copies (<250 copies / mL). A negative result must be combined with clinical observations, patient history, and epidemiological information.  Fact Sheet for Patients:   StrictlyIdeas.no  Fact Sheet for Healthcare Providers: BankingDealers.co.za  This test is not yet  approved or  cleared by the [place] FDA and has been authorized for detection and/or diagnosis of SARS-CoV-2 by FDA under an Emergency Use Authorization (EUA).  This EUA will remain in effect (meaning this test can be used) for the duration of the COVID-19 declaration under Section 564(b)(1) of the Act, 21 U.S.C. section 360bbb-3(b)(1), unless the authorization is terminated or revoked sooner.  Performed at [facility], [place] [address], [place], [place] [address]   MRSA PCR Screening     Status: None   Collection Time: 05/27/20 11:46 AM   Specimen: Nasal Mucosa; Nasopharyngeal  Result Value Ref Range Status   MRSA by PCR NEGATIVE NEGATIVE Final    Comment:        The GeneXpert MRSA Assay (FDA approved for NASAL specimens only), is one component of a comprehensive MRSA colonization surveillance program. It is not intended to diagnose MRSA infection nor to guide or monitor treatment for MRSA infections. Performed at [facility], [place] [address] [place], [place] [address]      Labs: Basic Metabolic Panel: Recent Labs  Lab 05/30/20 0417 05/31/20 0410 06/01/20 0406 06/02/20 0336  NA 137 135 136 135  K 3.7 4.9 4.4 3.9  CL 108 104 103 102  CO2 23 24 23 26   GLUCOSE 115* 133* 99 96  BUN <5* <5* 7* 8  CREATININE 0.84 0.70 0.74 0.64  CALCIUM 7.9* 7.9* 8.4* 8.4*  MG 2.0  --   --   --    Liver Function Tests: No results for input(s): AST, ALT, ALKPHOS, BILITOT, PROT, ALBUMIN in the last 168 hours. No results for input(s): LIPASE, AMYLASE in the last 168 hours. No results for input(s): AMMONIA in the last 168 hours. CBC: Recent Labs  Lab 05/30/20 0417 05/30/20 2122 05/31/20 0410 06/01/20 0406  WBC 7.6 15.4* 12.2* 14.5*  HGB 11.8* 13.0 12.7 12.0  HCT 35.1* 39.2 37.7 35.8*  MCV 93.1 94.2 93.5 94.0  PLT 324 338 350 381   Cardiac Enzymes: No results for input(s): CKTOTAL, CKMB, CKMBINDEX, TROPONINI in the last 168  hours. BNP: BNP (last 3 results) No results for input(s): BNP in the last 8760 hours.  ProBNP (last 3 results) No results for input(s): PROBNP in the last 8760 hours.  CBG: No results for input(s): GLUCAP in the last 168 hours.  Principal Problem:   Colonic obstruction (HCC) Active Problems:   Colonic mass   Atrial fibrillation with RVR ([place])   Time coordinating discharge: 38 minutes.  Signed:        Shanty Ginty, DO [facility]  06/05/2020, 4:18 PM

## 2020-06-06 ENCOUNTER — Ambulatory Visit: Payer: Self-pay | Admitting: General Surgery

## 2020-06-07 LAB — SURGICAL PATHOLOGY

## 2020-06-08 NOTE — Progress Notes (Signed)
Spoke with patient introduced myself and explained my role as Art therapist.  I have moved her appointment from 8/3 at 2 pm to 7/27 at 2 pm with Dr. Benay Spice. She is aware of our location and appreciated the call.  I did ask that she arrive by 1:45 for registration.

## 2020-06-14 ENCOUNTER — Other Ambulatory Visit: Payer: Self-pay

## 2020-06-14 ENCOUNTER — Inpatient Hospital Stay: Payer: Medicare Other | Attending: Oncology | Admitting: Oncology

## 2020-06-14 ENCOUNTER — Encounter (HOSPITAL_BASED_OUTPATIENT_CLINIC_OR_DEPARTMENT_OTHER): Payer: Self-pay | Admitting: General Surgery

## 2020-06-14 ENCOUNTER — Telehealth: Payer: Self-pay | Admitting: Oncology

## 2020-06-14 VITALS — BP 120/71 | HR 115 | Temp 97.7°F | Resp 20 | Ht 66.0 in | Wt 134.3 lb

## 2020-06-14 DIAGNOSIS — C185 Malignant neoplasm of splenic flexure: Secondary | ICD-10-CM | POA: Diagnosis not present

## 2020-06-14 DIAGNOSIS — C186 Malignant neoplasm of descending colon: Secondary | ICD-10-CM | POA: Diagnosis not present

## 2020-06-14 NOTE — Telephone Encounter (Signed)
Scheduled per 07/27 los, patient received updated calender.

## 2020-06-14 NOTE — Progress Notes (Signed)
Kathy Robinson   Requesting MD: Aretta Nip, [address] [place],  [place] [address]   Kathy Robinson 79 y.o.  1941-01-27    Reason for Robinson: Colon cancer   HPI: Kathy Robinson had transient abdominal discomfort in March of this year.  A few months later she became "full "after meals and began eating less.  She saw her primary provider and was referred to gastroenterology, but could not have a colonoscopy until August.  She developed nausea/vomiting beginning 05/25/2020.  A CT of the abdomen/pelvis at [facility] on 05/26/2020 revealed a small amount of free fluid in the pelvis.  Several too small to characterize liver lesions.  A long segment of masslike thickening of the descending colon with a high-grade obstruction was noted.  Stranding and nodularity of the surrounding mesocolon.  There was an ill-defined 8 mm extracolonic collection of air, likely a contained microperforation.  There is retroperitoneal adenopathy.  She was referred to the emergency room.  A chest x-ray on 05/27/2020 revealed no acute process.  She was noted to have rapid atrial fibrillation and cardiology was consulted.  Surgery was consulted and she was taken to the operating room by Dr. Kieth Brightly on 05/30/2020 for a left colectomy.  The liver had no nodules.  The right colon and small bowel were grossly normal.  The left colon was firm and indurated up to the level of the splenic flexure.  An area of distal transverse colon and small intestine mesentery was adherent to the colon tumor.  The distal transverse colon and sigmoid colon were divided and an anastomosis was performed.  The pathology revealed an adenocarcinoma of the splenic flexure and distal colon.  No macroscopic tumor perforation.  Tumor invaded the visceral peritoneum.  The resection margins were negative.  Lymphovascular and perineural invasion are present.  At least for tumor deposits.  Seven of 13  lymph nodes contained metastatic tumor.  No loss of mismatch repair protein expression.  The gross exam confirmed a mass adherent to omentum.  Satellite tumor nodules were noted to start the serosa.  She was discharged home on 06/03/2020.  Her bowels are functioning.  She is eating.  Past Medical History:  Diagnosis Date  . Atrial fibrillation ([place])    on ekg 05-27-2020  . Cancer ([place])  most recent 01/16/2013   h/o basal cell, squamous cell ca rt upper lip  . Colon cancer ([facility])-splenic flexure, [id number]  05/30/2020  . Hyperlipemia     .  G0 P0  Past Surgical History:  Procedure Laterality Date  . COLON RESECTION N/A 05/30/2020   Procedure: OPEN PARTIAL COLECTOMY;  Surgeon: Kieth Brightly Arta Bruce, MD;  Location: [facility] ORS;  Service: General;  Laterality: N/A;  . COLONOSCOPY  02/00   Dr. Deatra Ina    .  Tonsillectomy as a child  Medications: Reviewed  Allergies:  Allergies  Allergen Reactions  . Codeine     REACTION: nausea  . Neosporin [Neomycin-Bacitracin Zn-Polymyx] Swelling    rash  . Tape     Can use paper tape    Family history: Her mother died of lung cancer in her 98s and had a remote smoking history.  Her maternal grandmother died of colon cancer in her 34s.  Social History:   She is a retired Automotive engineer.  She does not use cigarettes.  She drinks wine occasionally.  No transfusion history.  No risk factor for HIV or hepatitis.  She lives  alone in [place].  ROS:   Positives include: Early "fullness "when eating meals prior to surgery, nausea/vomiting for 2 days prior to hospital admission July 2021  A complete ROS was otherwise negative.  Physical Exam:  Blood pressure 120/71, pulse (!) 115, temperature 97.7 F (36.5 C), temperature source Temporal, resp. rate 20, height _0  (1.676 m), weight 134 lb 4.8 oz (60.9 kg), last menstrual period 11/19/1996, SpO2 97 %.  HEENT: Neck without mass Lungs: Clear bilaterally Cardiac: Regular rate and rhythm Abdomen:  No hepatosplenomegaly, no mass, nontender, healing midline incisions with Steri-Strips in place  Vascular: No leg edema Lymph nodes: No cervical, supraclavicular, axillary, or inguinal nodes Neurologic: Alert and oriented, the motor exam appears grossly intact Skin: No rash Musculoskeletal: No spine tenderness   LAB:  CBC  Lab Results  Component Value Date   WBC 14.5 (H) 06/01/2020   HGB 12.0 06/01/2020   HCT 35.8 (L) 06/01/2020   MCV 94.0 06/01/2020   PLT 381 06/01/2020        CMP  Lab Results  Component Value Date   NA 135 06/02/2020   K 3.9 06/02/2020   CL 102 06/02/2020   CO2 26 06/02/2020   GLUCOSE 96 06/02/2020   BUN 8 06/02/2020   CREATININE 0.64 06/02/2020   CALCIUM 8.4 (L) 06/02/2020   PROT 8.5 (H) 05/26/2020   ALBUMIN 4.2 05/26/2020   AST 22 05/26/2020   ALT 16 05/26/2020   ALKPHOS 60 05/26/2020   BILITOT 0.7 05/26/2020   GFRNONAA >60 06/02/2020   GFRAA >60 06/02/2020     Lab Results  Component Value Date   CEA1 13.4 (H) 05/27/2020    Imaging:  CT images from 05/26/2020 reviewed with Kathy Robinson and her sister   Assessment/Plan:   1. Adenocarcinoma the left colon, stage IIIc (pT4b,pN2a), status post a left colectomy 05/30/2020  Tumor invades the visceral peritoneum, lymphovascular and perineural invasion present, for tumor deposits, 7/13 lymph nodes, negative resection margins, no loss of mismatch repair protein expression  CT abdomen/pelvis 05/26/2020-long segment of masslike thickening of the descending colon with associated high-grade colonic obstruction, small colonic wall defect with evidence of a focally contained microperforation, retroperitoneal adenopathy, indeterminate small hypodense liver lesions  Elevated preoperative CEA 2. Atrial fibrillation with rapid ventricular response 05/26/2020   Disposition:   Kathy Robinson has been diagnosed with colon cancer.  I discussed the operative findings, details of the surgical pathology  report, and reviewed the preoperative CT images with Kathy Robinson and her sister.  She has been diagnosed with a locally advanced tumor and I am concerned she most likely has stage IV disease.  There are enlarged retroperitoneal lymph nodes on the preoperative CT.  She will be a candidate for adjuvant systemic chemotherapy or palliative chemotherapy if she is confirmed to have stage IV disease.  If she will be scheduled for a staging PET scan next week.  I will present her case at the GI tumor conference tomorrow.  She will return for an office visit on 06/27/2020.  She is scheduled for Port-A-Cath placement 06/20/2020.  I think it is very likely she will need the Port-A-Cath for the administration of systemic chemotherapy.  Kathy Robinson does not appear to have hereditary nonpolyposis colon cancer syndrome, but her family members are at increased risk of developing colorectal cancer and should receive appropriate screening.  [name], MD  06/14/2020, 4:36 PM

## 2020-06-14 NOTE — Progress Notes (Signed)
Met with patient and her sister Arbie Cookey today at initial medical oncology consult with Dr. Benay Spice.  I explained my role as nurse navigator and patient was given my card with my direct phone number.  She verbalized an understanding of the plan to proceed with getting a PET scan.  She is already scheduled for a port a cath on 8/2.  She understands that I will call her once the scan is approved and scheduled.  She was shown a model of what a port looks and feels like.  They were escorted to scheduling.

## 2020-06-14 NOTE — Progress Notes (Addendum)
Spoke with Konrad Felix pa , pt meets [facility] guidelines  Spoke w/ via phone for pre-op interview---pt Lab needs dos---- I stat 8           covid test 06-18-2020 at 915 cannot do test before 06-18-2020 Arrive at -------930 am 06-20-2020 NPO after MN NO Solid Food.  Clear liquids from MN until---830 am then npo Medications to take morning of surgery -----none Diabetic medication -----n/a Patient Special Instructions -----none Pre-Op special Istructions -----none Patient verbalized understanding of instructions that were given at this phone interview. Patient denies shortness of breath, chest pain, fever, cough a this phone interview.  Anesthesia : in hospital  large bowel obstructions open partial colectomy 05-30-2020, 05-27-2020 new afib on ekg, has appt to see dr croitoru 06-23-2020 after 06-20-2020 pac insertion, pt states no cardiac S & S, no sob at pre op phone call  PCP: dr Jordan Hawks [place] Cardiologist :will see dr croitoru as new pt 06-23-2020 Chest x-ray : 1 view 05-27-2020 epic EKG :05-27-2020 epic, ekg 05-29-2020 no afib epic Echo : 7-9=-2021 epic Cardiac Cath : none Activity level: does own housework, can climb flight of steps without problems Sleep Study/ CPAP :n/a Fasting Blood Sugar :      / Checks Blood Sugar -- times a day:  n/a Blood Thinner/ Instructions /Last Dose:n/a ASA / Instructions/ Last Dose : n/a

## 2020-06-15 ENCOUNTER — Other Ambulatory Visit: Payer: Self-pay

## 2020-06-15 ENCOUNTER — Encounter (HOSPITAL_COMMUNITY): Payer: Self-pay

## 2020-06-15 NOTE — Progress Notes (Signed)
Requested [employer] [facility] testing be added onto specimen [id number] emailed Luellen Pucker - [facility] pathology.

## 2020-06-16 ENCOUNTER — Other Ambulatory Visit (HOSPITAL_COMMUNITY): Payer: Medicare Other

## 2020-06-18 ENCOUNTER — Other Ambulatory Visit (HOSPITAL_COMMUNITY)
Admission: RE | Admit: 2020-06-18 | Discharge: 2020-06-18 | Disposition: A | Discharge status: Home / Self Care | Payer: Medicare Other | Source: Ambulatory Visit | Attending: General Surgery | Admitting: General Surgery

## 2020-06-18 DIAGNOSIS — Z20822 Contact with and (suspected) exposure to covid-19: Secondary | ICD-10-CM | POA: Diagnosis not present

## 2020-06-18 DIAGNOSIS — Z01818 Encounter for other preprocedural examination: Secondary | ICD-10-CM | POA: Insufficient documentation

## 2020-06-18 LAB — SARS CORONAVIRUS 2 (TAT 6-24 HRS): SARS Coronavirus 2: NEGATIVE

## 2020-06-20 ENCOUNTER — Encounter (HOSPITAL_BASED_OUTPATIENT_CLINIC_OR_DEPARTMENT_OTHER)
Admission: RE | Disposition: A | Discharge status: Home / Self Care | Payer: Self-pay | Source: Home / Self Care | Attending: General Surgery

## 2020-06-20 ENCOUNTER — Ambulatory Visit (HOSPITAL_COMMUNITY): Payer: Medicare Other

## 2020-06-20 ENCOUNTER — Encounter (HOSPITAL_BASED_OUTPATIENT_CLINIC_OR_DEPARTMENT_OTHER): Payer: Self-pay | Admitting: General Surgery

## 2020-06-20 ENCOUNTER — Ambulatory Visit (HOSPITAL_BASED_OUTPATIENT_CLINIC_OR_DEPARTMENT_OTHER): Payer: Medicare Other | Admitting: Anesthesiology

## 2020-06-20 ENCOUNTER — Ambulatory Visit (HOSPITAL_BASED_OUTPATIENT_CLINIC_OR_DEPARTMENT_OTHER)
Admission: RE | Admit: 2020-06-20 | Discharge: 2020-06-20 | Disposition: A | Discharge status: Home / Self Care | Payer: Medicare Other | Attending: General Surgery | Admitting: General Surgery

## 2020-06-20 ENCOUNTER — Other Ambulatory Visit: Payer: Self-pay

## 2020-06-20 DIAGNOSIS — Z885 Allergy status to narcotic agent status: Secondary | ICD-10-CM | POA: Diagnosis not present

## 2020-06-20 DIAGNOSIS — Z9049 Acquired absence of other specified parts of digestive tract: Secondary | ICD-10-CM | POA: Insufficient documentation

## 2020-06-20 DIAGNOSIS — I4891 Unspecified atrial fibrillation: Secondary | ICD-10-CM | POA: Insufficient documentation

## 2020-06-20 DIAGNOSIS — E785 Hyperlipidemia, unspecified: Secondary | ICD-10-CM | POA: Diagnosis not present

## 2020-06-20 DIAGNOSIS — C189 Malignant neoplasm of colon, unspecified: Secondary | ICD-10-CM | POA: Insufficient documentation

## 2020-06-20 DIAGNOSIS — Z888 Allergy status to other drugs, medicaments and biological substances status: Secondary | ICD-10-CM | POA: Insufficient documentation

## 2020-06-20 DIAGNOSIS — Z79899 Other long term (current) drug therapy: Secondary | ICD-10-CM | POA: Diagnosis not present

## 2020-06-20 DIAGNOSIS — Z87891 Personal history of nicotine dependence: Secondary | ICD-10-CM | POA: Diagnosis not present

## 2020-06-20 DIAGNOSIS — Z95828 Presence of other vascular implants and grafts: Secondary | ICD-10-CM

## 2020-06-20 HISTORY — DX: Unspecified atrial fibrillation: I48.91

## 2020-06-20 HISTORY — PX: PORTACATH PLACEMENT: SHX2246

## 2020-06-20 LAB — POCT I-STAT, CHEM 8
BUN: 19 mg/dL (ref 8–23)
Calcium, Ion: 1.23 mmol/L (ref 1.15–1.40)
Chloride: 104 mmol/L (ref 98–111)
Creatinine, Ser: 0.6 mg/dL (ref 0.44–1.00)
Glucose, Bld: 107 mg/dL — ABNORMAL HIGH (ref 70–99)
HCT: 42 % (ref 36.0–46.0)
Hemoglobin: 14.3 g/dL (ref 12.0–15.0)
Potassium: 4.1 mmol/L (ref 3.5–5.1)
Sodium: 141 mmol/L (ref 135–145)
TCO2: 22 mmol/L (ref 22–32)

## 2020-06-20 SURGERY — INSERTION, TUNNELED CENTRAL VENOUS DEVICE, WITH PORT
Anesthesia: General | Site: Chest | Laterality: Right

## 2020-06-20 MED ORDER — ONDANSETRON HCL 4 MG/2ML IJ SOLN
INTRAMUSCULAR | Status: DC | PRN
  Administered 2020-06-20: 4 mg via INTRAVENOUS

## 2020-06-20 MED ORDER — PROPOFOL 10 MG/ML IV BOLUS
INTRAVENOUS | Status: DC | PRN
  Administered 2020-06-20: 140 mg via INTRAVENOUS

## 2020-06-20 MED ORDER — HEPARIN SOD (PORK) LOCK FLUSH 100 UNIT/ML IV SOLN
INTRAVENOUS | Status: DC | PRN
  Administered 2020-06-20: 500 [IU] via INTRAVENOUS

## 2020-06-20 MED ORDER — PHENYLEPHRINE HCL (PRESSORS) 10 MG/ML IV SOLN
INTRAVENOUS | Status: DC | PRN
Start: 2020-06-20 — End: 2020-06-20
  Administered 2020-06-20 (×4): 80 ug via INTRAVENOUS

## 2020-06-20 MED ORDER — FENTANYL CITRATE (PF) 100 MCG/2ML IJ SOLN: 25.0000 ug | INTRAMUSCULAR | Status: DC | PRN

## 2020-06-20 MED ORDER — ACETAMINOPHEN 500 MG PO TABS
1000.0000 mg | ORAL_TABLET | ORAL | Status: AC
  Administered 2020-06-20: 1000 mg via ORAL

## 2020-06-20 MED ORDER — EPHEDRINE SULFATE 50 MG/ML IJ SOLN
INTRAMUSCULAR | Status: DC | PRN
Start: 2020-06-20 — End: 2020-06-20
  Administered 2020-06-20: 10 mg via INTRAVENOUS

## 2020-06-20 MED ORDER — PHENYLEPHRINE 40 MCG/ML (10ML) SYRINGE FOR IV PUSH (FOR BLOOD PRESSURE SUPPORT)
PREFILLED_SYRINGE | INTRAVENOUS | Status: AC
  Filled 2020-06-20: qty 10

## 2020-06-20 MED ORDER — ONDANSETRON HCL 4 MG/2ML IJ SOLN
INTRAMUSCULAR | Status: AC
  Filled 2020-06-20: qty 2

## 2020-06-20 MED ORDER — LIDOCAINE 2% (20 MG/ML) 5 ML SYRINGE
INTRAMUSCULAR | Status: AC
  Filled 2020-06-20: qty 5

## 2020-06-20 MED ORDER — LACTATED RINGERS IV SOLN
INTRAVENOUS | Status: DC
  Administered 2020-06-20: 50 mL via INTRAVENOUS

## 2020-06-20 MED ORDER — BUPIVACAINE-EPINEPHRINE 0.25% -1:200000 IJ SOLN
INTRAMUSCULAR | Status: AC
  Filled 2020-06-20: qty 1

## 2020-06-20 MED ORDER — EPHEDRINE 5 MG/ML INJ
INTRAVENOUS | Status: AC
  Filled 2020-06-20: qty 10

## 2020-06-20 MED ORDER — KETOROLAC TROMETHAMINE 15 MG/ML IJ SOLN: 15.0000 mg | INTRAMUSCULAR | Status: DC

## 2020-06-20 MED ORDER — DEXAMETHASONE SODIUM PHOSPHATE 10 MG/ML IJ SOLN
INTRAMUSCULAR | Status: AC
  Filled 2020-06-20: qty 1

## 2020-06-20 MED ORDER — CHLORHEXIDINE GLUCONATE CLOTH 2 % EX PADS: 6.0000 | MEDICATED_PAD | Freq: Once | CUTANEOUS | Status: DC

## 2020-06-20 MED ORDER — FENTANYL CITRATE (PF) 100 MCG/2ML IJ SOLN
INTRAMUSCULAR | Status: AC
  Filled 2020-06-20: qty 2

## 2020-06-20 MED ORDER — DEXAMETHASONE SODIUM PHOSPHATE 4 MG/ML IJ SOLN
INTRAMUSCULAR | Status: DC | PRN
  Administered 2020-06-20: 5 mg via INTRAVENOUS

## 2020-06-20 MED ORDER — CEFAZOLIN SODIUM-DEXTROSE 2-4 GM/100ML-% IV SOLN
2.0000 g | INTRAVENOUS | Status: AC
  Administered 2020-06-20: 2 g via INTRAVENOUS

## 2020-06-20 MED ORDER — LIDOCAINE HCL (CARDIAC) PF 100 MG/5ML IV SOSY
PREFILLED_SYRINGE | INTRAVENOUS | Status: DC | PRN
  Administered 2020-06-20: 40 mg via INTRAVENOUS

## 2020-06-20 MED ORDER — ONDANSETRON HCL 4 MG/2ML IJ SOLN: 4.0000 mg | Freq: Once | INTRAMUSCULAR | Status: DC | PRN

## 2020-06-20 MED ORDER — ACETAMINOPHEN 500 MG PO TABS
ORAL_TABLET | ORAL | Status: AC
  Filled 2020-06-20: qty 2

## 2020-06-20 MED ORDER — SODIUM CHLORIDE 0.9 % IV SOLN
Freq: Once | INTRAVENOUS | Status: AC
  Administered 2020-06-20: 20 mL
  Filled 2020-06-20: qty 1.2

## 2020-06-20 MED ORDER — CEFAZOLIN SODIUM-DEXTROSE 2-4 GM/100ML-% IV SOLN
INTRAVENOUS | Status: AC
  Filled 2020-06-20: qty 100

## 2020-06-20 MED ORDER — FENTANYL CITRATE (PF) 100 MCG/2ML IJ SOLN
INTRAMUSCULAR | Status: DC | PRN
  Administered 2020-06-20: 50 ug via INTRAVENOUS

## 2020-06-20 MED ORDER — PROPOFOL 10 MG/ML IV BOLUS
INTRAVENOUS | Status: AC
  Filled 2020-06-20: qty 20

## 2020-06-20 MED ORDER — BUPIVACAINE-EPINEPHRINE 0.25% -1:200000 IJ SOLN
INTRAMUSCULAR | Status: DC | PRN
  Administered 2020-06-20: 10 mL

## 2020-06-20 MED ORDER — HEPARIN SOD (PORK) LOCK FLUSH 100 UNIT/ML IV SOLN
INTRAVENOUS | Status: AC
  Filled 2020-06-20: qty 5

## 2020-06-20 SURGICAL SUPPLY — 42 items
ADH SKN CLS APL DERMABOND .7 (GAUZE/BANDAGES/DRESSINGS) ×1
APL PRP STRL LF DISP 70% ISPRP (MISCELLANEOUS) ×1
BAG DECANTER FOR FLEXI CONT (MISCELLANEOUS) ×2 IMPLANT
BLADE HEX COATED 2.75 (ELECTRODE) ×2 IMPLANT
BLADE SURG 11 STRL SS (BLADE) ×1 IMPLANT
BLADE SURG 15 STRL LF DISP TIS (BLADE) ×1 IMPLANT
BLADE SURG 15 STRL SS (BLADE) ×2
CHLORAPREP W/TINT 26 (MISCELLANEOUS) ×2 IMPLANT
COVER BACK TABLE 60X90IN (DRAPES) ×2 IMPLANT
COVER MAYO STAND STRL (DRAPES) ×2 IMPLANT
COVER PROBE W GEL 5X96 (DRAPES) ×2 IMPLANT
COVER WAND RF STERILE (DRAPES) ×2 IMPLANT
DERMABOND ADVANCED (GAUZE/BANDAGES/DRESSINGS) ×1
DERMABOND ADVANCED .7 DNX12 (GAUZE/BANDAGES/DRESSINGS) ×1 IMPLANT
DRAPE C-ARM 42X120 X-RAY (DRAPES) ×2 IMPLANT
DRAPE LAPAROTOMY TRNSV 102X78 (DRAPES) ×2 IMPLANT
DRAPE UTILITY XL STRL (DRAPES) ×3 IMPLANT
ELECT REM PT RETURN 9FT ADLT (ELECTROSURGICAL) ×2
ELECTRODE REM PT RTRN 9FT ADLT (ELECTROSURGICAL) ×1 IMPLANT
GLOVE BIOGEL PI IND STRL 7.0 (GLOVE) IMPLANT
GLOVE BIOGEL PI IND STRL 7.5 (GLOVE) ×1 IMPLANT
GLOVE BIOGEL PI INDICATOR 7.0 (GLOVE) ×1
GLOVE BIOGEL PI INDICATOR 7.5 (GLOVE) ×1
GLOVE ECLIPSE 7.5 STRL STRAW (GLOVE) ×1 IMPLANT
GLOVE SURG SS PI 7.0 STRL IVOR (GLOVE) ×2 IMPLANT
GOWN STRL REUS W/ TWL LRG LVL3 (GOWN DISPOSABLE) ×1 IMPLANT
GOWN STRL REUS W/TWL LRG LVL3 (GOWN DISPOSABLE) ×4
KIT PORT POWER 8FR ISP CVUE (Port) ×1 IMPLANT
KIT TURNOVER CYSTO (KITS) ×2 IMPLANT
NEEDLE HYPO 22GX1.5 SAFETY (NEEDLE) ×2 IMPLANT
NS IRRIG 1000ML POUR BTL (IV SOLUTION) ×2 IMPLANT
PACK BASIN DAY SURGERY FS (CUSTOM PROCEDURE TRAY) ×2 IMPLANT
PAD ARMBOARD 7.5X6 YLW CONV (MISCELLANEOUS) ×1 IMPLANT
PENCIL BUTTON HOLSTER BLD 10FT (ELECTRODE) ×2 IMPLANT
SUT MNCRL AB 4-0 PS2 18 (SUTURE) ×2 IMPLANT
SUT PROLENE 2 0 SH DA (SUTURE) ×2 IMPLANT
SUT VIC AB 3-0 SH 27 (SUTURE) ×2
SUT VIC AB 3-0 SH 27X BRD (SUTURE) ×1 IMPLANT
SYR 10ML LL (SYRINGE) ×2 IMPLANT
SYR 20ML LL LF (SYRINGE) ×2 IMPLANT
SYR CONTROL 10ML LL (SYRINGE) ×1 IMPLANT
TOWEL OR 17X26 10 PK STRL BLUE (TOWEL DISPOSABLE) ×2 IMPLANT

## 2020-06-20 NOTE — Anesthesia Preprocedure Evaluation (Addendum)
Anesthesia Evaluation  Patient identified by MRN, date of birth, ID band Patient awake    Reviewed: Allergy & Precautions, NPO status , Patient's Chart, lab work & pertinent test results  Airway Mallampati: II  TM Distance: >3 FB Neck ROM: Full    Dental no notable dental hx. (+) Teeth Intact, Dental Advisory Given, Caps   Pulmonary former smoker,    Pulmonary exam normal breath sounds clear to auscultation       Cardiovascular Normal cardiovascular exam+ dysrhythmias Atrial Fibrillation  Rhythm:Regular Rate:Normal  Hx/o paroxysmal atrial fibrillation with RVR 05/27/20 Currently in NSR  Echo 05/27/20 1. Left ventricular ejection fraction, by estimation, is 55 to 60%. The left ventricle has normal function. The left ventricle has no regional wall motion abnormalities. Left ventricular diastolic parameters were normal.  2. Right ventricular systolic function is normal. The right ventricular size is mildly enlarged.  3. Right atrial size was mildly dilated.  4. The mitral valve is normal in structure. No evidence of mitral valve regurgitation.  5. The aortic valve is grossly normal. Aortic valve regurgitation is not visualized.    Neuro/Psych negative neurological ROS  negative psych ROS   GI/Hepatic Neg liver ROS, Hx/o colon Ca S/P partial colectomy   Endo/Other  Hyperlipidemia  Renal/GU negative Renal ROS  negative genitourinary   Musculoskeletal negative musculoskeletal ROS (+)   Abdominal   Peds  Hematology negative hematology ROS (+)   Anesthesia Other Findings   Reproductive/Obstetrics                          Anesthesia Physical Anesthesia Plan  ASA: II  Anesthesia Plan: General   Post-op Pain Management:    Induction: Intravenous  PONV Risk Score and Plan: 4 or greater and Ondansetron and Treatment may vary due to age or medical condition  Airway Management Planned:  LMA  Additional Equipment:   Intra-op Plan:   Post-operative Plan: Extubation in OR  Informed Consent: I have reviewed the patients History and Physical, chart, labs and discussed the procedure including the risks, benefits and alternatives for the proposed anesthesia with the patient or authorized representative who has indicated his/her understanding and acceptance.     Dental advisory given  Plan Discussed with: CRNA, Anesthesiologist and Surgeon  Anesthesia Plan Comments:         Anesthesia Quick Evaluation

## 2020-06-20 NOTE — Discharge Instructions (Signed)
PORT-A-CATH: POST OP INSTRUCTIONS  Always review your discharge instruction sheet given to you by the facility where your surgery was performed.   1. A prescription for pain medication may be given to you upon discharge. Take your pain medication as prescribed, if needed. If narcotic pain medicine is not needed, then you make take acetaminophen (Tylenol) or ibuprofen (Advil) as needed.  2. Take your usually prescribed medications unless otherwise directed. 3. If you need a refill on your pain medication, please contact our office. All narcotic pain medicine now requires a paper prescription.  Phoned in and fax refills are no longer allowed by law.  Prescriptions will not be filled after 5 pm or on weekends.  4. You should follow a light diet for the remainder of the day after your procedure. 5. Most patients will experience some mild swelling and/or bruising in the area of the incision. It may take several days to resolve. 6. It is common to experience some constipation if taking pain medication after surgery. Increasing fluid intake and taking a stool softener (such as Colace) will usually help or prevent this problem from occurring. A mild laxative (Milk of Magnesia or Miralax) should be taken according to package directions if there are no bowel movements after 48 hours.  7. Unless discharge instructions indicate otherwise, you may remove your bandages 48 hours after surgery, and you may shower at that time. You may have steri-strips (small white skin tapes) in place directly over the incision.  These strips should be left on the skin for 7-10 days.  If your surgeon used Dermabond (skin glue) on the incision, you may shower in 24 hours.  The glue will flake off over the next 2-3 weeks.  8. If your port is left accessed at the end of surgery (needle left in port), the dressing cannot get wet and should only by changed by a healthcare professional. When the port is no longer accessed (when the  needle has been removed), follow step 7.   9. ACTIVITIES:  Limit activity involving your arms for the next 72 hours. Do no strenuous exercise or activity for 1 week. You may drive when you are no longer taking prescription pain medication, you can comfortably wear a seatbelt, and you can maneuver your car. 10.You may need to see your doctor in the office for a follow-up appointment.  Please       check with your doctor.  11.When you receive a new Port-a-Cath, you will get a product guide and        ID card.  Please keep them in case you need them.  WHEN TO CALL YOUR DOCTOR [phone]): 1. Fever over 101.0 2. Chills 3. Continued bleeding from incision 4. Increased redness and tenderness at the site 5. Shortness of breath, difficulty breathing   The clinic staff is available to answer your questions during regular business hours. Please don't hesitate to call and ask to speak to one of the nurses or medical assistants for clinical concerns. If you have a medical emergency, go to the nearest emergency room or call 911.  A surgeon from [facility] is always on call at the hospital.     For further information, please visit [email or web address]   Post Anesthesia Home Care Instructions  Activity: Get plenty of rest for the remainder of the day. A responsible individual must stay with you for 24 hours following the procedure.  For the next 24 hours, DO NOT: -Drive a car -  alcoholic beverages -Take any medication unless instructed by your physician -Make any legal decisions or sign important papers.  Meals: Start with liquid foods such as gelatin or soup. Progress to regular foods as tolerated. Avoid greasy, spicy, heavy foods. If nausea and/or vomiting occur, drink only clear liquids until the nausea and/or vomiting subsides. Call your physician if vomiting continues.  Special Instructions/Symptoms: Your throat may feel dry or sore from the  anesthesia or the breathing tube placed in your throat during surgery. If this causes discomfort, gargle with warm salt water. The discomfort should disappear within 24 hours.        

## 2020-06-20 NOTE — Anesthesia Postprocedure Evaluation (Signed)
Anesthesia Post Note  Patient: Kathy Robinson  Procedure(s) Performed: INSERTION PORT-A-CATH WITH ULTRASOUND (Right Chest)     Patient location during evaluation: PACU Anesthesia Type: General Level of consciousness: awake and alert and oriented Pain management: pain level controlled Vital Signs Assessment: post-procedure vital signs reviewed and stable Respiratory status: spontaneous breathing, nonlabored ventilation and respiratory function stable Cardiovascular status: blood pressure returned to baseline and stable Postop Assessment: no apparent nausea or vomiting Anesthetic complications: no   No complications documented.  Last Vitals:  Vitals:   06/20/20 0959 06/20/20 1145  BP: (!) 100/87 (!) 128/58  Pulse: (!) 110 91  Resp: 16 (!) 23  Temp: 37.1 C   SpO2: 100% 98%    Last Pain:  Vitals:   06/20/20 0959  TempSrc: Oral  PainSc: 0-No pain                 Geovanie Winnett A.

## 2020-06-20 NOTE — Op Note (Signed)
Preoperative diagnosis: colon cancer  Postoperative diagnosis: same  Procedure: insertion of right internal jugular vein port-a-cath with ultrasound and fluoro guidance  Surgeon: Gurney Maxin, M.D.  Asst: none  Anesthesia: gen   Indications for procedure: 79 yo female with recent diagnosis of colon cancer and is being evaluated for chemotherapy.  Description of procedure: The patient was brought into the operative suite, anesthesia was administered with LMA, both arms were tucked with offloading foam over all pressure points. The patient was prepped and draped in the usual sterile fashion. Next WHO checklist was completed. The patient was put in trendelenberg, the [place] was used to assess anatomy and the RIJ was large and lay anterior the the common carotid artery A 18ga needle was used to gain access to the RIJ using ultrasound guidance. Nonpulsatile flow was seen and the J-wire was advanced without tension. XR was used to confirm placement within the venous system. No arrthymias were seen, the wire was clamped in place. The percutaneous access site was widened with a 11 blade. Local anesthesia was used to anesthetize the space inferior to the right clavicle. Next a 3cm incision was made 3cm inferior to the clavicle. Cautery was used to create a pocket along the fascia inferior to incision. The tunneling device was used to make a wide turn from the pocket up to the perc access site. The introducer and sheath were then thread over the J wire in seldinger technique and wire was removed. Next the catheter was thread from the incision to the access site and inserted into the sheath after the introducer was removed. Sheath was then pulled and removed. XR showed the tip of catheter to be at the atrial-caval junction. The port was then attached to the catheter cutting the catheter at appropriate length. The port was then placed in pocket and sewed to the fascia in two places with a 2-0 prolene. The port was  accessed with huber needel and flushed and drew easily and the port was flushed with concentrated heparin. The incision was closed with 3-0 vicryl followed with 4-0 monocryl in running subcu fashion. A single 4-0 was used to close the access site. Liqui-band was placed for dressing. Patient was extubated and brought to pacu in stable condition.  Findings: patent RIJ, catheter tip at the SVC atrial junction  Specimen: none  Blood loss: 5 cc  Local anesthesia: 10cc 0.25% marcaine w epi  Complications: none  Gurney Maxin, M.D. General, Bariatric, & Minimally Invasive Surgery [facility]

## 2020-06-20 NOTE — Transfer of Care (Signed)
Immediate Anesthesia Transfer of Care Note  Patient: Kathy Robinson  Procedure(s) Performed: Procedure(s) (LRB): INSERTION PORT-A-CATH WITH ULTRASOUND (Right)  Patient Location: PACU  Anesthesia Type: General  Level of Consciousness: awake, sedated, patient cooperative and responds to stimulation  Airway & Oxygen Therapy: Patient Spontanous Breathing and Patient connected to [place] 02 and soft FM   Post-op Assessment: Report given to PACU RN, Post -op Vital signs reviewed and stable and Patient moving all extremities  Post vital signs: Reviewed and stable  Complications: No apparent anesthesia complications

## 2020-06-20 NOTE — H&P (Signed)
Kathy Robinson is an 79 y.o. female.   Chief Complaint: cancer HPI: 79 yo female found to have colon cancer s/p colectomy with anastomosis. She did well from the procedure and now is being evaluated for chemotherapy and presents for port-a-cath insertion.  Past Medical History:  Diagnosis Date  . Atrial fibrillation ([place])    on ekg 05-27-2020  . Cancer ([place])  most recent 01/16/2013   h/o basal cell, squamous cell ca rt upper lip  . Colon cancer ([place]) 2021  . Hyperlipemia     Past Surgical History:  Procedure Laterality Date  . COLON RESECTION N/A 05/30/2020   Procedure: OPEN PARTIAL COLECTOMY;  Surgeon: Kieth Brightly Arta Bruce, MD;  Location: [facility];  Service: General;  Laterality: N/A;  . COLONOSCOPY  02/00   Dr. Deatra Ina    Family History  Problem Relation Age of Onset  . Osteoporosis Mother   . Cancer Mother   . Cancer Maternal Grandmother        colon  . AAA (abdominal aortic aneurysm) Father    Social History:  reports that she has quit smoking. Her smoking use included cigarettes. She has a 1.25 pack-year smoking history. She has never used smokeless tobacco. She reports previous alcohol use of about 3.0 standard drinks of alcohol per week. She reports that she does not use drugs.  Allergies:  Allergies  Allergen Reactions  . Codeine     REACTION: nausea  . Neosporin [Neomycin-Bacitracin Zn-Polymyx] Swelling    rash  . Tape     Can use paper tape    Medications Prior to Admission  Medication Sig Dispense Refill  . acetaminophen (TYLENOL) 500 MG tablet Take 2 tablets (1,000 mg total) by mouth every 6 (six) hours. (Patient taking differently: Take 1,000 mg by mouth every 6 (six) hours. Takes 1 to 2) 30 tablet 0  . docusate sodium (COLACE) 100 MG capsule Take 100 mg by mouth at bedtime.    . feeding supplement, ENSURE ENLIVE, (ENSURE ENLIVE) LIQD Take 237 mLs by mouth 2 (two) times daily between meals. 237 mL 12  . Melatonin 5 MG CAPS Take by mouth at bedtime.    .  Multiple Vitamins-Minerals (MULTI COMPLETE PO) Take by mouth. One a day 50 plus    . Multiple Vitamins-Minerals (VITAMIN D3 COMPLETE PO) Take 2,000 Units by mouth daily.    . Polyethylene Glycol 3350 (MIRALAX PO) Take 17 g by mouth daily.    . pravastatin (PRAVACHOL) 20 MG tablet Take 20 mg by mouth at bedtime.     Marland Kitchen oxyCODONE (OXY IR/ROXICODONE) 5 MG immediate release tablet Take 1-2 tablets (5-10 mg total) by mouth every 6 (six) hours as needed (pain). (Patient not taking: Reported on 06/14/2020) 25 tablet 0    Results for orders placed or performed during the hospital encounter of 06/20/20 (from the past 48 hour(s))  I-STAT, chem 8     Status: Abnormal   Collection Time: 06/20/20 10:02 AM  Result Value Ref Range   Sodium 141 135 - 145 mmol/L   Potassium 4.1 3.5 - 5.1 mmol/L   Chloride 104 98 - 111 mmol/L   BUN 19 8 - 23 mg/dL   Creatinine, Ser 0.60 0.44 - 1.00 mg/dL   Glucose, Bld 107 (H) 70 - 99 mg/dL    Comment: Glucose reference range applies only to samples taken after fasting for at least 8 hours.   Calcium, Ion 1.23 1.15 - 1.40 mmol/L   TCO2 22 22 - 32 mmol/L  Hemoglobin 14.3 12.0 - 15.0 g/dL   HCT 42.0 36 - 46 %   No results found.  Review of Systems  Constitutional: Negative for chills and fever.  HENT: Negative for hearing loss.   Respiratory: Negative for cough.   Cardiovascular: Negative for chest pain and palpitations.  Gastrointestinal: Negative for abdominal pain, nausea and vomiting.  Genitourinary: Negative for dysuria and urgency.  Musculoskeletal: Negative for myalgias and neck pain.  Skin: Negative for rash.  Neurological: Negative for dizziness and headaches.  Hematological: Does not bruise/bleed easily.  Psychiatric/Behavioral: Negative for suicidal ideas.    Blood pressure (!) 100/87, pulse (!) 110, temperature 98.7 F (37.1 C), temperature source Oral, resp. rate 16, height 5\' 6"  (1.676 m), weight 60.2 kg, last menstrual period 11/19/1996, SpO2 100  %. Physical Exam Vitals and nursing note reviewed.  Constitutional:      Appearance: She is well-developed.  HENT:     Head: Normocephalic and atraumatic.  Eyes:     General: No scleral icterus.    Conjunctiva/sclera: Conjunctivae normal.  Cardiovascular:     Rate and Rhythm: Normal rate and regular rhythm.  Pulmonary:     Effort: Pulmonary effort is normal.     Breath sounds: Normal breath sounds. No wheezing or rales.  Chest:     Chest wall: No tenderness.  Abdominal:     General: There is no distension.     Palpations: Abdomen is soft.     Tenderness: There is no abdominal tenderness. There is no rebound.     Comments: Midline incision well healed  Musculoskeletal:        General: Normal range of motion.     Cervical back: Normal range of motion and neck supple.  Skin:    General: Skin is warm and dry.  Neurological:     Mental Status: She is alert and oriented to person, place, and time.  Psychiatric:        Behavior: Behavior normal.      Assessment/Plan 79 yo female with colon cancer -port-a-cath insertion -planned outpatient procedure  Mickeal Skinner, MD 06/20/2020, 10:31 AM

## 2020-06-20 NOTE — Anesthesia Procedure Notes (Signed)
Procedure Name: LMA Insertion Date/Time: 06/20/2020 10:53 AM Performed by: Justice Rocher, CRNA Pre-anesthesia Checklist: Patient identified, Emergency Drugs available, Suction available, Patient being monitored and Timeout performed Patient Re-evaluated:Patient Re-evaluated prior to induction Oxygen Delivery Method: Circle system utilized Preoxygenation: Pre-oxygenation with 100% oxygen Induction Type: IV induction Ventilation: Mask ventilation without difficulty LMA: LMA inserted LMA Size: 3.0 Number of attempts: 1 Airway Equipment and Method: Bite block Placement Confirmation: positive ETCO2,  CO2 detector and breath sounds checked- equal and bilateral Tube secured with: Tape Dental Injury: Teeth and Oropharynx as per pre-operative assessment

## 2020-06-21 ENCOUNTER — Ambulatory Visit: Payer: Medicare Other | Admitting: Oncology

## 2020-06-21 ENCOUNTER — Encounter (HOSPITAL_BASED_OUTPATIENT_CLINIC_OR_DEPARTMENT_OTHER): Payer: Self-pay | Admitting: General Surgery

## 2020-06-22 ENCOUNTER — Ambulatory Visit (HOSPITAL_COMMUNITY)
Admission: RE | Admit: 2020-06-22 | Discharge: 2020-06-22 | Disposition: A | Discharge status: Home / Self Care | Payer: Medicare Other | Source: Ambulatory Visit | Attending: Oncology | Admitting: Oncology

## 2020-06-22 ENCOUNTER — Other Ambulatory Visit: Payer: Self-pay

## 2020-06-22 DIAGNOSIS — Z85038 Personal history of other malignant neoplasm of large intestine: Secondary | ICD-10-CM | POA: Diagnosis not present

## 2020-06-22 DIAGNOSIS — C772 Secondary and unspecified malignant neoplasm of intra-abdominal lymph nodes: Secondary | ICD-10-CM | POA: Insufficient documentation

## 2020-06-22 DIAGNOSIS — C787 Secondary malignant neoplasm of liver and intrahepatic bile duct: Secondary | ICD-10-CM | POA: Insufficient documentation

## 2020-06-22 DIAGNOSIS — C771 Secondary and unspecified malignant neoplasm of intrathoracic lymph nodes: Secondary | ICD-10-CM | POA: Insufficient documentation

## 2020-06-22 DIAGNOSIS — C185 Malignant neoplasm of splenic flexure: Secondary | ICD-10-CM | POA: Diagnosis present

## 2020-06-22 LAB — GLUCOSE, CAPILLARY: Glucose-Capillary: 104 mg/dL — ABNORMAL HIGH (ref 70–99)

## 2020-06-22 MED ORDER — FLUDEOXYGLUCOSE F - 18 (FDG) INJECTION
6.2700 | Freq: Once | INTRAVENOUS | Status: AC
  Administered 2020-06-22: 6.27 via INTRAVENOUS

## 2020-06-23 ENCOUNTER — Encounter: Payer: Self-pay | Admitting: *Deleted

## 2020-06-23 ENCOUNTER — Encounter: Payer: Self-pay | Admitting: Cardiovascular Disease

## 2020-06-23 ENCOUNTER — Ambulatory Visit: Payer: Medicare Other | Admitting: Cardiovascular Disease

## 2020-06-23 VITALS — BP 132/78 | HR 96 | Ht 66.0 in | Wt 134.8 lb

## 2020-06-23 DIAGNOSIS — E7800 Pure hypercholesterolemia, unspecified: Secondary | ICD-10-CM | POA: Diagnosis not present

## 2020-06-23 DIAGNOSIS — C189 Malignant neoplasm of colon, unspecified: Secondary | ICD-10-CM

## 2020-06-23 DIAGNOSIS — I480 Paroxysmal atrial fibrillation: Secondary | ICD-10-CM

## 2020-06-23 DIAGNOSIS — I517 Cardiomegaly: Secondary | ICD-10-CM

## 2020-06-23 NOTE — Progress Notes (Signed)
Cardiology Office Note:    Date:  06/26/2020   ID:  Kathy Robinson, DOB 12/31/1940, MRN [id number]  PCP:  Aretta Nip, MD  [facility] Cardiologist:  Sanda Klein, MD  [facility] Electrophysiologist:  None   Referring MD: Aretta Nip, MD   Chief Complaint  Patient presents with   Irregular Heart Beat    History of Present Illness:    Kathy Robinson is a 79 y.o. female with hypercholesterolemia and a hx of a single brief episode of paroxysmal atrial fibrillation rapid ventricular response that occurred during hospitalization for large bowel obstruction due to colonic mass, followed by partial left hemicolectomy.  She has no known previous history of cardiac problems and is quite fit.  She was never aware of palpitations even when her heart rate was very fast.  She was discharged from the hospital in normal sinus rhythm and is again in sinus rhythm today.  The patient specifically denies any chest pain at rest exertion, dyspnea at rest or with exertion, orthopnea, paroxysmal nocturnal dyspnea, syncope, palpitations, focal neurological deficits, intermittent claudication, lower extremity edema, unexplained weight gain, cough, hemoptysis or wheezing.  The echocardiogram performed during her hospitalization in July showed normal LVEF 55 to 60%, no valvular abnormalities, but did show mild enlargement of the right atrium and right ventricle.  No clear evidence of atrial septal defect.  She does not have symptoms to suggest obstructive sleep apnea and is not a smoker.  Tricuspid regurgitation was described as mild, but the PA pressure could not be calculated.  She just had a PET scan and has yet to review it with Dr. Learta Robinson, her oncologist.  I looked at the results and it appears that she has liver metastasis in both lobes of the liver as well as extensive lymphadenopathy involving the periportal, periaortic, mediastinal and left supraclavicular lymph  nodes.  She had a right subclavian Port-A-Cath placed on August 2.  Past Medical History:  Diagnosis Date   Atrial fibrillation ([place])    on ekg 05-27-2020   Cancer ([place])  most recent 01/16/2013   h/o basal cell, squamous cell ca rt upper lip   Colon cancer ([place]) 2021   Hyperlipemia     Past Surgical History:  Procedure Laterality Date   COLON RESECTION N/A 05/30/2020   Procedure: OPEN PARTIAL COLECTOMY;  Surgeon: Mickeal Skinner, MD;  Location: [facility];  Service: General;  Laterality: N/A;   COLONOSCOPY  02/00   Dr. Acquanetta Belling PLACEMENT Right 06/20/2020   Procedure: INSERTION PORT-A-CATH WITH ULTRASOUND;  Surgeon: Mickeal Skinner, MD;  Location: [facility];  Service: General;  Laterality: Right;    Current Medications: No outpatient medications have been marked as taking for the 06/23/20 encounter (Office Visit) with Sanda Klein, MD.     Allergies:   Codeine, Neosporin [neomycin-bacitracin zn-polymyx], and Tape   Social History   Socioeconomic History   Marital status: Single    Spouse name: Not on file   Number of children: Not on file   Years of education: Not on file   Highest education level: Not on file  Occupational History   Not on file  Tobacco Use   Smoking status: Former Smoker    Packs/day: 0.25    Years: 5.00    Pack years: 1.25    Types: Cigarettes   Smokeless tobacco: Never Used   Tobacco comment: quit age 57's  Vaping Use   Vaping Use: Never used  Substance and Sexual Activity   Alcohol use: Not Currently    Alcohol/week: 3.0 standard drinks    Types: 3 Standard drinks or equivalent per week   Drug use: No   Sexual activity: Never    Partners: Male  Other Topics Concern   Not on file  Social History Narrative   Not on file   Social Determinants of Health   Financial Resource Strain:    Difficulty of Paying Living Expenses:   Food Insecurity:    Worried About Charity fundraiser in  the Last Year:    Arboriculturist in the Last Year:   Transportation Needs:    Film/video editor (Medical):    Lack of Transportation (Non-Medical):   Physical Activity:    Days of Exercise per Week:    Minutes of Exercise per Session:   Stress:    Feeling of Stress :   Social Connections:    Frequency of Communication with Friends and Family:    Frequency of Social Gatherings with Friends and Family:    Attends Religious Services:    Active Member of Clubs or Organizations:    Attends Music therapist:    Marital Status:      Family History: The patient's family history includes AAA (abdominal aortic aneurysm) in her father; Cancer in her maternal grandmother and mother; Osteoporosis in her mother.  ROS:   Please see the history of present illness.     All other systems reviewed and are negative.  EKGs/Labs/Other Studies Reviewed:    The following studies were reviewed today: PET scan  Echocardiogram 05/27/2020 1. Left ventricular ejection fraction, by estimation, is 55 to 60%. The  left ventricle has normal function. The left ventricle has no regional  wall motion abnormalities. Left ventricular diastolic parameters were  normal.  2. Right ventricular systolic function is normal. The right ventricular  size is mildly enlarged.  3. Right atrial size was mildly dilated.  4. The mitral valve is normal in structure. No evidence of mitral valve  regurgitation.  5. The aortic valve is grossly normal. Aortic valve regurgitation is not  visualized.   [On my review of her echo images I do not think there is evidence of right atrial or right ventricular enlargement; the calculated systolic PA pressure is around 28 mmHg].  EKG:  EKG is ordered today.  The ekg ordered today demonstrates normal sinus rhythm, normal tracing, QTC 419 ms  Recent Labs: 05/26/2020: ALT 16 05/29/2020: TSH 4.177 05/30/2020: Magnesium 2.0 06/01/2020: Platelets  381 06/20/2020: BUN 19; Creatinine, Ser 0.60; Hemoglobin 14.3; Potassium 4.1; Sodium 141  Recent Lipid Panel    Component Value Date/Time   CHOL 144 05/27/2020 0300   TRIG 52 05/27/2020 0300   HDL 48 05/27/2020 0300   CHOLHDL 3.0 05/27/2020 0300   VLDL 10 05/27/2020 0300   LDLCALC 86 05/27/2020 0300    Physical Exam:    VS:  BP 132/78    Pulse 96    Ht 5\' 6"  (1.676 m)    Wt 134 lb 12.8 oz (61.1 kg)    LMP 11/19/1996    SpO2 98%    BMI 21.76 kg/m     Wt Readings from Last 3 Encounters:  06/23/20 134 lb 12.8 oz (61.1 kg)  06/20/20 132 lb 11.2 oz (60.2 kg)  06/14/20 134 lb 4.8 oz (60.9 kg)     General: Alert, oriented x3, no distress, appears well Head: no evidence of trauma, PERRL,  EOMI, no exophtalmos or lid lag, no myxedema, no xanthelasma; normal ears, nose and oropharynx Neck: normal jugular venous pulsations and no hepatojugular reflux; brisk carotid pulses without delay and no carotid bruits Chest: clear to auscultation, no signs of consolidation by percussion or palpation, normal fremitus, symmetrical and full respiratory excursions right subclavian Port-A-Cath site is healing well. Cardiovascular: normal position and quality of the apical impulse, regular rhythm, normal first and second heart sounds, no murmurs, rubs or gallops Abdomen: no tenderness or distention, no masses by palpation, no abnormal pulsatility or arterial bruits, normal bowel sounds, no hepatosplenomegaly Extremities: no clubbing, cyanosis or edema; 2+ radial, ulnar and brachial pulses bilaterally; 2+ right femoral, posterior tibial and dorsalis pedis pulses; 2+ left femoral, posterior tibial and dorsalis pedis pulses; no subclavian or femoral bruits Neurological: grossly nonfocal Psych: Normal mood and affect   ASSESSMENT:    1. Paroxysmal atrial fibrillation (HCC)   2. Hypercholesterolemia   3. Metastatic colon cancer in female [facility])   4. Right ventricular enlargement    PLAN:    In order of  problems listed above:  1. AFib: 1 single episode detected, during hyperadrenergic state in the hospital.  She was arrhythmia unaware and may be having other events.  Would meet criteria for anticoagulation based on age and gender (CHA2DS2-VASc 3).  Currently not on anticoagulants.  Will check an outpatient arrhythmia monitor to see if anticoagulation is indicated. 2. HLP: Lipid parameters appear to be within desirable range on pravastatin. 3. Metastatic colon cancer: She has yet to review the results of her PET scan with her oncologist.  She appears to have very widespread neoplasm.  She already has a Port-A-Cath in place for chemotherapy. 4. Right heart chamber enlargement: I am not sure this is clinically important.  I reviewed the echo images myself and I do not think the right heart chambers are truly enlarged.  The apical images were obtained off axis.  I think we can estimate her PA pressure and it is no more than about 28 mmHg.  She does not have symptoms or signs of obstructive sleep apnea or COPD and does not have a history of PE or DVT.  Could have an atrial septal defect that was not detected by echo.  Before we recommend more aggressive evaluation (for example transesophageal echocardiogram or right heart catheterization), I think it is important that she have her oncology appointment and discuss treatment and prognosis for her colon cancer.   Medication Adjustments/Labs and Tests Ordered: Current medicines are reviewed at length with the patient today.  Concerns regarding medicines are outlined above.  Orders Placed This Encounter  Procedures   Cardiac event monitor   EKG 12-Lead   No orders of the defined types were placed in this encounter.   Patient Instructions  Medication Instructions:  No changes *If you need a refill on your cardiac medications before your next appointment, please call your pharmacy*   Lab Work: None ordered If you have labs (blood work) drawn today  and your tests are completely normal, you will receive your results only by:  [place] (if you have MyChart) OR  A paper copy in the mail If you have any lab test that is abnormal or we need to change your treatment, we will call you to review the results.   Testing/Procedures: [employer] Cardiac Event Monitor Instructions Your physician has requested you wear your cardiac event monitor for __30___ days, (1-30). [employer] may call or text to confirm a shipping  address. The monitor will be sent to a land address via UPS. [employer] will not ship a monitor to a PO BOX. It typically takes 3-5 days to receive your monitor after it has been enrolled. [employer] will assist with USPS tracking if your package is delayed. The telephone number for [employer] is [phone]. Once you have received your monitor, please review the enclosed instructions. Instruction tutorials can also be viewed under help and settings on the enclosed cell phone. Your monitor has already been registered assigning a specific monitor serial # to you.  Applying the monitor Remove cell phone from case and turn it on. The cell phone works as Dealer and needs to be within [employer] of you at all times. The cell phone will need to be charged on a daily basis. We recommend you plug the cell phone into the enclosed charger at your bedside table every night.  Monitor batteries: You will receive two monitor batteries labelled #1 and #2. These are your recorders. Plug battery #2 onto the second connection on the enclosed charger. Keep one battery on the charger at all times. This will keep the monitor battery deactivated. It will also keep it fully charged for when you need to switch your monitor batteries. A small light will be blinking on the battery emblem when it is charging. The light on the battery emblem will remain on when the battery is fully charged.  Open package of a Monitor strip. Insert battery  #1 into black hood on strip and gently squeeze monitor battery onto connection as indicated in instruction booklet. Set aside while preparing skin.  Choose location for your strip, vertical or horizontal, as indicated in the instruction booklet. Shave to remove all hair from location. There cannot be any lotions, oils, powders, or colognes on skin where monitor is to be applied. Wipe skin clean with enclosed Saline wipe. Dry skin completely.  Peel paper labeled #1 off the back of the Monitor strip exposing the adhesive. Place the monitor on the chest in the vertical or horizontal position shown in the instruction booklet. One arrow on the monitor strip must be pointing upward. Carefully remove paper labeled #2, attaching remainder of strip to your skin. Try not to create any folds or wrinkles in the strip as you apply it.  Firmly press and release the circle in the center of the monitor battery. You will hear a small beep. This is turning the monitor battery on. The heart emblem on the monitor battery will light up every 5 seconds if the monitor battery in turned on and connected to the patient securely. Do not push and hold the circle down as this turns the monitor battery off. The cell phone will locate the monitor battery. A screen will appear on the cell phone checking the connection of your monitor strip. This may read poor connection initially but change to good connection within the next minute. Once your monitor accepts the connection you will hear a series of 3 beeps followed by a climbing crescendo of beeps. A screen will appear on the cell phone showing the two monitor strip placement options. Touch the picture that demonstrates where you applied the monitor strip.  Your monitor strip and battery are waterproof. You are able to shower, bathe, or swim with the monitor on. They just ask you do not submerge deeper than 3 feet underwater. We recommend removing the monitor if you are  swimming in a lake, river, or ocean.  Your monitor battery  will need to be switched to a fully charged monitor battery approximately once a week. The cell phone will alert you of an action which needs to be made.  On the cell phone, tap for details to reveal connection status, monitor battery status, and cell phone battery status. The green dots indicates your monitor is in good status. A red dot indicates there is something that needs your attention.  To record a symptom, click the circle on the monitor battery. In 30-60 seconds a list of symptoms will appear on the cell phone. Select your symptom and tap save. Your monitor will record a sustained or significant arrhythmia regardless of you clicking the button. Some patients do not feel the heart rhythm irregularities. [employer] will notify us of any serious or critical events.  Refer to instruction booklet for instructions on switching batteries, changing strips, the Do not disturb or Pause features, or any additional questions.  Call [employer] at [phone], to confirm your monitor is transmitting and record your baseline. They will answer any questions you may have regarding the monitor instructions at that time.  Returning the monitor to [place] all equipment back into blue box. Peel off strip of paper to expose adhesive and close box securely. There is a prepaid UPS shipping label on this box. Drop in a UPS drop box, or at a UPS facility like [employer]. You may also contact [employer] to arrange UPS to pick up monitor package at your home.    Follow-Up: At [facility], you and your health needs are our priority.  As part of our continuing mission to provide you with exceptional heart care, we have created designated Provider Care Teams.  These Care Teams include your primary Cardiologist (physician) and Advanced Practice Providers (APPs -  Physician Assistants and Nurse Practitioners) who all work together to provide  you with the care you need, when you need it.  We recommend signing up for the patient portal called "MyChart".  Sign up information is provided on this After Visit Summary.  MyChart is used to connect with patients for Virtual Visits (Telemedicine).  Patients are able to view lab/test results, encounter notes, upcoming appointments, etc.  Non-urgent messages can be sent to your provider as well.   To learn more about what you can do with MyChart, go to [email or web address].    Your next appointment:   3 month(s)  The format for your next appointment:   In Person  Provider:   You may see Sanda Klein, MD or one of the following Advanced Practice Providers on your designated Care Team:    Almyra Deforest, PA-C  Fabian Sharp, [place] or   Roby Lofts, PA-C        Signed, Sanda Klein, MD  06/26/2020 11:29 AM    [place]

## 2020-06-23 NOTE — Progress Notes (Signed)
Patient ID: Kathy Robinson, female   DOB: 08/30/1941, 79 y.o.   MRN: [id number] Patient enrolled for [employer] to ship a 30 day cardiac event monitor to her home.

## 2020-06-23 NOTE — Patient Instructions (Signed)
Medication Instructions:  No changes *If you need a refill on your cardiac medications before your next appointment, please call your pharmacy*   Lab Work: None ordered If you have labs (blood work) drawn today and your tests are completely normal, you will receive your results only by: . MyChart Message (if you have MyChart) OR . A paper copy in the mail If you have any lab test that is abnormal or we need to change your treatment, we will call you to review the results.   Testing/Procedures: [employer] Cardiac Event Monitor Instructions Your physician has requested you wear your cardiac event monitor for ___30_ days, (1-30). [employer] may call or text to confirm a shipping address. The monitor will be sent to a land address via UPS. [employer] will not ship a monitor to a PO BOX. It typically takes 3-5 days to receive your monitor after it has been enrolled. [employer] will assist with USPS tracking if your package is delayed. The telephone number for [employer] is [phone]. Once you have received your monitor, please review the enclosed instructions. Instruction tutorials can also be viewed under help and settings on the enclosed cell phone. Your monitor has already been registered assigning a specific monitor serial # to you.  Applying the monitor Remove cell phone from case and turn it on. The cell phone works as your transmitter and needs to be within 10 feet of you at all times. The cell phone will need to be charged on a daily basis. We recommend you plug the cell phone into the enclosed charger at your bedside table every night.  Monitor batteries: You will receive two monitor batteries labelled #1 and #2. These are your recorders. Plug battery #2 onto the second connection on the enclosed charger. Keep one battery on the charger at all times. This will keep the monitor battery deactivated. It will also keep it fully charged for when you need to switch your monitor  batteries. A small light will be blinking on the battery emblem when it is charging. The light on the battery emblem will remain on when the battery is fully charged.  Open package of a Monitor strip. Insert battery #1 into black hood on strip and gently squeeze monitor battery onto connection as indicated in instruction booklet. Set aside while preparing skin.  Choose location for your strip, vertical or horizontal, as indicated in the instruction booklet. Shave to remove all hair from location. There cannot be any lotions, oils, powders, or colognes on skin where monitor is to be applied. Wipe skin clean with enclosed Saline wipe. Dry skin completely.  Peel paper labeled #1 off the back of the Monitor strip exposing the adhesive. Place the monitor on the chest in the vertical or horizontal position shown in the instruction booklet. One arrow on the monitor strip must be pointing upward. Carefully remove paper labeled #2, attaching remainder of strip to your skin. Try not to create any folds or wrinkles in the strip as you apply it.  Firmly press and release the circle in the center of the monitor battery. You will hear a small beep. This is turning the monitor battery on. The heart emblem on the monitor battery will light up every 5 seconds if the monitor battery in turned on and connected to the patient securely. Do not push and hold the circle down as this turns the monitor battery off. The cell phone will locate the monitor battery. A screen will appear on the cell phone checking the   connection of your monitor strip. This may read poor connection initially but change to good connection within the next minute. Once your monitor accepts the connection you will hear a series of 3 beeps followed by a climbing crescendo of beeps. A screen will appear on the cell phone showing the two monitor strip placement options. Touch the picture that demonstrates where you applied the monitor  strip.  Your monitor strip and battery are waterproof. You are able to shower, bathe, or swim with the monitor on. They just ask you do not submerge deeper than 3 feet underwater. We recommend removing the monitor if you are swimming in a lake, river, or ocean.  Your monitor battery will need to be switched to a fully charged monitor battery approximately once a week. The cell phone will alert you of an action which needs to be made.  On the cell phone, tap for details to reveal connection status, monitor battery status, and cell phone battery status. The green dots indicates your monitor is in good status. A red dot indicates there is something that needs your attention.  To record a symptom, click the circle on the monitor battery. In 30-60 seconds a list of symptoms will appear on the cell phone. Select your symptom and tap save. Your monitor will record a sustained or significant arrhythmia regardless of you clicking the button. Some patients do not feel the heart rhythm irregularities. [employer] will notify us of any serious or critical events.  Refer to instruction booklet for instructions on switching batteries, changing strips, the Do not disturb or Pause features, or any additional questions.  Call [employer] at [phone], to confirm your monitor is transmitting and record your baseline. They will answer any questions you may have regarding the monitor instructions at that time.  Returning the monitor to [employer] Place all equipment back into blue box. Peel off strip of paper to expose adhesive and close box securely. There is a prepaid UPS shipping label on this box. Drop in a UPS drop box, or at a UPS facility like [employer]. You may also contact [employer] to arrange UPS to pick up monitor package at your home.    Follow-Up: At [facility], you and your health needs are our priority.  As part of our continuing mission to provide you with exceptional heart care, we  have created designated Provider Care Teams.  These Care Teams include your primary Cardiologist (physician) and Advanced Practice Providers (APPs -  Physician Assistants and Nurse Practitioners) who all work together to provide you with the care you need, when you need it.  We recommend signing up for the patient portal called "MyChart".  Sign up information is provided on this After Visit Summary.  MyChart is used to connect with patients for Virtual Visits (Telemedicine).  Patients are able to view lab/test results, encounter notes, upcoming appointments, etc.  Non-urgent messages can be sent to your provider as well.   To learn more about what you can do with MyChart, go to [email or web address].    Your next appointment:   3 month(s)  The format for your next appointment:   In Person  Provider:   You may see Mihai Croitoru, MD or one of the following Advanced Practice Providers on your designated Care Team:    Hao Meng, PA-C  Angela Duke, PA-C or   Krista Kroeger, PA-C  

## 2020-06-26 ENCOUNTER — Encounter: Payer: Self-pay | Admitting: Cardiovascular Disease

## 2020-06-27 ENCOUNTER — Inpatient Hospital Stay: Payer: Medicare Other | Attending: Oncology | Admitting: Oncology

## 2020-06-27 ENCOUNTER — Other Ambulatory Visit: Payer: Self-pay

## 2020-06-27 ENCOUNTER — Encounter (HOSPITAL_COMMUNITY): Payer: Self-pay | Admitting: Oncology

## 2020-06-27 VITALS — BP 142/77 | HR 112 | Temp 99.5°F | Resp 15 | Ht 66.0 in | Wt 135.1 lb

## 2020-06-27 DIAGNOSIS — Z7189 Other specified counseling: Secondary | ICD-10-CM | POA: Diagnosis not present

## 2020-06-27 DIAGNOSIS — Z5111 Encounter for antineoplastic chemotherapy: Secondary | ICD-10-CM | POA: Insufficient documentation

## 2020-06-27 DIAGNOSIS — C185 Malignant neoplasm of splenic flexure: Secondary | ICD-10-CM | POA: Diagnosis not present

## 2020-06-27 DIAGNOSIS — Z79899 Other long term (current) drug therapy: Secondary | ICD-10-CM | POA: Insufficient documentation

## 2020-06-27 DIAGNOSIS — I4891 Unspecified atrial fibrillation: Secondary | ICD-10-CM | POA: Insufficient documentation

## 2020-06-27 DIAGNOSIS — Z85828 Personal history of other malignant neoplasm of skin: Secondary | ICD-10-CM | POA: Insufficient documentation

## 2020-06-27 DIAGNOSIS — R5383 Other fatigue: Secondary | ICD-10-CM | POA: Insufficient documentation

## 2020-06-27 DIAGNOSIS — E785 Hyperlipidemia, unspecified: Secondary | ICD-10-CM | POA: Insufficient documentation

## 2020-06-27 MED ORDER — PROCHLORPERAZINE MALEATE 10 MG PO TABS: 10.0000 mg | ORAL_TABLET | Freq: Four times a day (QID) | ORAL | 1 refills | Status: DC | PRN

## 2020-06-27 MED ORDER — ONDANSETRON HCL 8 MG PO TABS: 8.0000 mg | ORAL_TABLET | Freq: Three times a day (TID) | ORAL | 1 refills | Status: DC | PRN

## 2020-06-27 MED ORDER — LIDOCAINE-PRILOCAINE 2.5-2.5 % EX CREA
1.0000 | TOPICAL_CREAM | CUTANEOUS | 3 refills | Status: DC | PRN
Start: 2020-06-27 — End: 2020-06-30

## 2020-06-27 NOTE — Progress Notes (Signed)
START ON PATHWAY REGIMEN - Colorectal     A cycle is every 14 days:     Bevacizumab-xxxx      Oxaliplatin      Leucovorin      Fluorouracil      Fluorouracil   **Always confirm dose/schedule in your pharmacy ordering system**  Patient Characteristics: Distant Metastases, Nonsurgical Candidate, BRAF V600 Mutation Positive (KRAS/NRAS Wild-Type), Standard Cytotoxic/Targeted Therapy, First Line Standard Cytotoxic/Targeted Therapy Tumor Location: Colon Therapeutic Status: Distant Metastases Microsatellite/Mismatch Repair Status: MSS/pMMR BRAF Mutation Status: Mutation Positive KRAS/NRAS Mutation Status: Wild-Type (no mutation) Standard Cytotoxic/Targeted Line of Therapy: First Line Standard Cytotoxic/Targeted Therapy Intent of Therapy: Non-Curative / Palliative Intent, Discussed with Patient 

## 2020-06-27 NOTE — Progress Notes (Signed)
[place] OFFICE VISIT PROGRESS NOTE  I connected with [name] on 06/27/20 at  2:00 PM EDT by video and verified that I am speaking with the correct person using two identifiers.   I discussed the limitations, risks, security and privacy concerns of performing an evaluation and management service by telemedicine and the availability of in-person appointments. I also discussed with the patient that there may be a patient responsible charge related to this service. The patient expressed understanding and agreed to proceed.    Patient's location:  office Provider's location: home    Diagnosis:  Colon Cancer  INTERVAL HISTORY:   Kathy Robinson returns for a scheduled visit.  She underwent Port-A-Cath placement on 06/20/2020.  She feels well.  Her bowels are functioning.  No complaint today.  Objective:  Vital signs in last 24 hours:   Lab Results:  Lab Results  Component Value Date   WBC 14.5 (H) 06/01/2020   HGB 14.3 06/20/2020   HCT 42.0 06/20/2020   MCV 94.0 06/01/2020   PLT 381 06/01/2020     Medications: I have reviewed the patient's current medications.  Assessment/Plan: 1. Adenocarcinoma the left colon, stage IIIc (pT4b,pN2a), status post a left colectomy 05/30/2020, MSS, TMB 13, BRAF V600E ? Tumor invades the visceral peritoneum, lymphovascular and perineural invasion present, for tumor deposits, 7/13 lymph nodes, negative resection margins, no loss of mismatch repair protein expression ? CT abdomen/pelvis 05/26/2020-long segment of masslike thickening of the descending colon with associated high-grade colonic obstruction, small colonic wall defect with evidence of a focally contained microperforation, retroperitoneal adenopathy, indeterminate small hypodense liver lesions ? Elevated preoperative CEA ? PET 06/22/2020-hypermetabolic liver metastases, periportal and periaortic adenopathy, hypermetabolic mediastinal  and left supraclavicular nodes 2. Atrial fibrillation with rapid ventricular response 05/26/2020    Disposition: Kathy Robinson was diagnosed with colon cancer when she underwent a left colectomy last month.  She was found to have locally advanced disease at the time of the left colectomy last month.  CTs revealed indeterminate liver lesions and retroperitoneal lymphadenopathy.  A staging PET scan is consistent with metastatic colon cancer involving the liver and chest/abdominal lymph nodes.  I discussed the diagnosis of metastatic colon cancer with Kathy Robinson.  Her sister was present for today's visit.  She understands no therapy will be curative.  I explained the significance of the BRAF mutation.  Tumor is harboring this mutation have relative resistance to standard chemotherapy and predict for a more aggressive behavior.  I recommend treatment with FOLFOXIRI.  We discussed treatment with encorafenib and Panitumumab in the second line setting.  We discussed potential toxicities associated with the FOLFOXOIRI regimen including the chance for nausea/vomiting, mucositis, diarrhea, alopecia, and hematologic toxicity with the potential for infection and bleeding.  We discussed the sun sensitivity, hyperpigmentation, rash, and hand/foot syndrome associated with 5-fluorouracil.  We reviewed the allergic reaction and various types of neuropathy seen with oxaliplatin.  We discussed the alopecia and acute/delayed diarrhea associated with irinotecan.  She agrees to proceed.  She will attend a chemotherapy teaching class.  The plan is to begin treatment with FOLFOX.  Irinotecan will be added to the regimen after 1 or 2 cycles pending her tolerance.  She will be referred for a restaging CT evaluation after 5 cycles of systemic therapy.  She will return for chemotherapy teaching class and baseline labs to include a CEA later this week.  A chemotherapy plan was entered today.  She will return  for cycle 1  chemotherapy on 07/05/2020.  I discussed the assessment and treatment plan with the patient. The patient was provided an opportunity to ask questions and all were answered. The patient agreed with the plan and demonstrated an understanding of the instructions.   The patient was advised to call back or seek an in-person evaluation if the symptoms worsen or if the condition fails to improve as anticipated.  I provided 45 minutes of video, chart review, and documentation time during this encounter, and > 50% was spent counseling as documented under my assessment & plan.  [name] ANP/GNP-BC   06/27/2020 1:54 PM

## 2020-06-28 ENCOUNTER — Telehealth: Payer: Self-pay | Admitting: Oncology

## 2020-06-28 NOTE — Telephone Encounter (Signed)
Scheduled appointments per 8/9 los. Patient is aware of all appointments dates and times.

## 2020-06-29 ENCOUNTER — Other Ambulatory Visit: Payer: Self-pay

## 2020-06-29 ENCOUNTER — Inpatient Hospital Stay: Payer: Medicare Other

## 2020-06-29 DIAGNOSIS — Z85828 Personal history of other malignant neoplasm of skin: Secondary | ICD-10-CM | POA: Diagnosis not present

## 2020-06-29 DIAGNOSIS — Z79899 Other long term (current) drug therapy: Secondary | ICD-10-CM | POA: Diagnosis not present

## 2020-06-29 DIAGNOSIS — R5383 Other fatigue: Secondary | ICD-10-CM | POA: Diagnosis not present

## 2020-06-29 DIAGNOSIS — Z95828 Presence of other vascular implants and grafts: Secondary | ICD-10-CM | POA: Insufficient documentation

## 2020-06-29 DIAGNOSIS — C185 Malignant neoplasm of splenic flexure: Secondary | ICD-10-CM | POA: Diagnosis not present

## 2020-06-29 DIAGNOSIS — I4891 Unspecified atrial fibrillation: Secondary | ICD-10-CM | POA: Diagnosis not present

## 2020-06-29 DIAGNOSIS — E785 Hyperlipidemia, unspecified: Secondary | ICD-10-CM | POA: Diagnosis not present

## 2020-06-29 DIAGNOSIS — Z5111 Encounter for antineoplastic chemotherapy: Secondary | ICD-10-CM | POA: Diagnosis not present

## 2020-06-29 LAB — CBC WITH DIFFERENTIAL (CANCER CENTER ONLY)
Abs Immature Granulocytes: 0.03 10*3/uL (ref 0.00–0.07)
Basophils Absolute: 0.1 10*3/uL (ref 0.0–0.1)
Basophils Relative: 1 %
Eosinophils Absolute: 0.6 10*3/uL — ABNORMAL HIGH (ref 0.0–0.5)
Eosinophils Relative: 7 %
HCT: 37.7 % (ref 36.0–46.0)
Hemoglobin: 12.4 g/dL (ref 12.0–15.0)
Immature Granulocytes: 0 %
Lymphocytes Relative: 18 %
Lymphs Abs: 1.7 10*3/uL (ref 0.7–4.0)
MCH: 31.6 pg (ref 26.0–34.0)
MCHC: 32.9 g/dL (ref 30.0–36.0)
MCV: 96.2 fL (ref 80.0–100.0)
Monocytes Absolute: 0.5 10*3/uL (ref 0.1–1.0)
Monocytes Relative: 6 %
Neutro Abs: 6.2 10*3/uL (ref 1.7–7.7)
Neutrophils Relative %: 68 %
Platelet Count: 309 10*3/uL (ref 150–400)
RBC: 3.92 MIL/uL (ref 3.87–5.11)
RDW: 15.5 % (ref 11.5–15.5)
WBC Count: 9.1 10*3/uL (ref 4.0–10.5)
nRBC: 0 % (ref 0.0–0.2)

## 2020-06-29 LAB — CMP (CANCER CENTER ONLY)
ALT: 24 U/L (ref 0–44)
AST: 43 U/L — ABNORMAL HIGH (ref 15–41)
Albumin: 3.5 g/dL (ref 3.5–5.0)
Alkaline Phosphatase: 87 U/L (ref 38–126)
Anion gap: 10 (ref 5–15)
BUN: 16 mg/dL (ref 8–23)
CO2: 24 mmol/L (ref 22–32)
Calcium: 10 mg/dL (ref 8.9–10.3)
Chloride: 105 mmol/L (ref 98–111)
Creatinine: 0.77 mg/dL (ref 0.44–1.00)
GFR, Est AFR Am: >60 mL/min (ref >60)
GFR, Estimated: >60 mL/min (ref >60)
Glucose, Bld: 100 mg/dL — ABNORMAL HIGH (ref 70–99)
Potassium: 3.9 mmol/L (ref 3.5–5.1)
Sodium: 139 mmol/L (ref 135–145)
Total Bilirubin: 0.3 mg/dL (ref 0.3–1.2)
Total Protein: 7.3 g/dL (ref 6.5–8.1)

## 2020-06-29 LAB — CEA (IN HOUSE-CHCC): CEA (CHCC-In House): 16.28 ng/mL — ABNORMAL HIGH (ref 0.00–5.00)

## 2020-06-29 MED ORDER — HEPARIN SOD (PORK) LOCK FLUSH 100 UNIT/ML IV SOLN
500.0000 [IU] | Freq: Once | INTRAVENOUS | Status: AC
  Administered 2020-06-29: 500 [IU]
  Filled 2020-06-29: qty 5

## 2020-06-29 MED ORDER — SODIUM CHLORIDE 0.9% FLUSH
10.0000 mL | Freq: Once | INTRAVENOUS | Status: AC
  Administered 2020-06-29: 10 mL
  Filled 2020-06-29: qty 10

## 2020-06-29 NOTE — Progress Notes (Signed)
Pharmacist Chemotherapy Monitoring - Initial Assessment    Anticipated start date: 07/05/20  Regimen:   Are orders appropriate based on the patients diagnosis, regimen, and cycle? Yes  Does the plan date match the patients scheduled date? Yes  Is the sequencing of drugs appropriate? Yes  Are the premedications appropriate for the patients regimen? Yes  Prior Authorization for treatment is: Approved o If applicable, is the correct biosimilar selected based on the patient's insurance? yes  Organ Function and Labs:  Are dose adjustments needed based on the patient's renal function, hepatic function, or hematologic function? No  Are appropriate labs ordered prior to the start of patient's treatment? Yes  Other organ system assessment, if indicated: bevacizumab: baseline BP  The following baseline labs, if indicated, have been ordered: bevacizumab: urine protein  Dose Assessment:  Are the drug doses appropriate? Yes  Are the following correct: o Drug concentrations Yes o IV fluid compatible with drug Yes o Administration routes Yes o Timing of therapy Yes  If applicable, does the patient have documented access for treatment and/or plans for port-a-cath placement? yes  If applicable, have lifetime cumulative doses been properly documented and assessed? yes Lifetime Dose Tracking  No doses have been documented on this patient for the following tracked chemicals: Doxorubicin, Epirubicin, Idarubicin, Daunorubicin, Mitoxantrone, Bleomycin, Oxaliplatin, Carboplatin, Liposomal Doxorubicin  o   Toxicity Monitoring/Prevention:  The patient has the following take home antiemetics prescribed: Ondansetron and Prochlorperazine  The patient has the following take home medications prescribed: N/A  Medication allergies and previous infusion related reactions, if applicable, have been reviewed and addressed. Yes  The patient's current medication list has been assessed for drug-drug  interactions with their chemotherapy regimen. no significant drug-drug interactions were identified on review.  Order Review:  Are the treatment plan orders signed? No  Is the patient scheduled to see a provider prior to their treatment? Yes  I verify that I have reviewed each item in the above checklist and answered each question accordingly.  Adelina Mings 06/29/2020 11:33 AM

## 2020-06-30 ENCOUNTER — Telehealth: Payer: Self-pay | Admitting: *Deleted

## 2020-06-30 MED ORDER — LIDOCAINE-PRILOCAINE 2.5-2.5 % EX CREA
1.0000 | TOPICAL_CREAM | CUTANEOUS | 0 refills | Status: DC | PRN
Start: 2020-06-30 — End: 2021-11-15

## 2020-06-30 MED ORDER — PROCHLORPERAZINE MALEATE 10 MG PO TABS
10.0000 mg | ORAL_TABLET | Freq: Four times a day (QID) | ORAL | 0 refills | Status: DC | PRN
Start: 2020-06-30 — End: 2024-02-06

## 2020-06-30 MED FILL — LIDOCAINE-PRILOCAINE 2.5-2.: 2.5-2.5 | 10 days supply | Qty: 30 | Fill #0

## 2020-06-30 NOTE — Telephone Encounter (Signed)
See other documentation notes.regarding 06/30/2020 0900 message received.  Automated [employer] message.  "Request no. [id number] for [employer] received on 06/29/2020 is now in a review process.  PA's may take several days dependent upon med plan coverage and details received from the doctor."    Connected with [place] who verified provider demographics.  Due to receiving call Juanna initiated and submitted new request reportedly as required.

## 2020-06-30 NOTE — Progress Notes (Addendum)
Ranina from [employer] calling to get prior auth for prochorperazine 10mg . Call back number [phone]   06/30/2020 Returned call at 11:03 am.  Initial request is in review process.

## 2020-07-01 ENCOUNTER — Encounter: Payer: Self-pay | Admitting: *Deleted

## 2020-07-01 NOTE — Progress Notes (Signed)
Per Dr. Benay Spice: Does not need labs on 07/05/20. Please use labs from 06/29/20. Called and left message for patient that she does not need the lab appointment.

## 2020-07-03 ENCOUNTER — Other Ambulatory Visit: Payer: Self-pay | Admitting: Oncology

## 2020-07-04 ENCOUNTER — Encounter: Payer: Self-pay | Admitting: Oncology

## 2020-07-04 ENCOUNTER — Encounter: Payer: Self-pay | Admitting: *Deleted

## 2020-07-04 ENCOUNTER — Ambulatory Visit (INDEPENDENT_AMBULATORY_CARE_PROVIDER_SITE_OTHER): Payer: Medicare Other

## 2020-07-04 ENCOUNTER — Other Ambulatory Visit: Payer: Self-pay

## 2020-07-04 ENCOUNTER — Telehealth: Payer: Self-pay | Admitting: Cardiovascular Disease

## 2020-07-04 DIAGNOSIS — I480 Paroxysmal atrial fibrillation: Secondary | ICD-10-CM | POA: Diagnosis not present

## 2020-07-04 NOTE — Progress Notes (Signed)
Called pt to introduce myself as her Arboriculturist.  Unfortunately there aren't any foundations offering copay assistance for her Dx and the type of ins she has.  I informed her of the [employer], went over what it covers and gave her the income requirement.  She declined the grant at this time.  I requested the registration staff give her my card in case she changes her mind and for any questions or concerns she may have in the future.

## 2020-07-04 NOTE — Telephone Encounter (Signed)
   Pt called, she would ike to ask Dr. Loletha Grayer if she can switch her heart monitor to a simpler one that can stick on her chest, since she will start chemo soon she will be wearing a pump. If not she would like for someone to help her putting the one she have right now.

## 2020-07-04 NOTE — Progress Notes (Signed)
Insurance carrier requesting PA for [employer]. Was informed by managed care that she was able to use a [employer] card and filled script for $21.00.

## 2020-07-04 NOTE — Telephone Encounter (Signed)
Patient scheduled to come into [facility] office 07/07/2020 at 3:00 to have cardiac event monitor applied.

## 2020-07-05 ENCOUNTER — Inpatient Hospital Stay: Payer: Medicare Other

## 2020-07-05 ENCOUNTER — Ambulatory Visit: Payer: Medicare Other

## 2020-07-05 ENCOUNTER — Other Ambulatory Visit: Payer: Self-pay

## 2020-07-05 VITALS — BP 106/69 | HR 99 | Temp 98.7°F | Resp 16 | Wt 134.8 lb

## 2020-07-05 DIAGNOSIS — C185 Malignant neoplasm of splenic flexure: Secondary | ICD-10-CM | POA: Diagnosis not present

## 2020-07-05 MED ORDER — SODIUM CHLORIDE 0.9 % IV SOLN
10.0000 mg | Freq: Once | INTRAVENOUS | Status: AC
  Administered 2020-07-05: 10 mg via INTRAVENOUS
  Filled 2020-07-05: qty 10

## 2020-07-05 MED ORDER — PALONOSETRON HCL INJECTION 0.25 MG/5ML
INTRAVENOUS | Status: AC
  Filled 2020-07-05: qty 5

## 2020-07-05 MED ORDER — PALONOSETRON HCL INJECTION 0.25 MG/5ML
0.2500 mg | Freq: Once | INTRAVENOUS | Status: AC
  Administered 2020-07-05: 0.25 mg via INTRAVENOUS

## 2020-07-05 MED ORDER — LEUCOVORIN CALCIUM INJECTION 350 MG
400.0000 mg/m2 | Freq: Once | INTRAVENOUS | Status: AC
  Administered 2020-07-05: 676 mg via INTRAVENOUS
  Filled 2020-07-05: qty 33.8

## 2020-07-05 MED ORDER — SODIUM CHLORIDE 0.9 % IV SOLN
2400.0000 mg/m2 | INTRAVENOUS | Status: DC
  Administered 2020-07-05: 4050 mg via INTRAVENOUS
  Filled 2020-07-05: qty 81

## 2020-07-05 MED ORDER — DEXTROSE 5 % IV SOLN
Freq: Once | INTRAVENOUS | Status: AC
  Filled 2020-07-05: qty 250

## 2020-07-05 MED ORDER — OXALIPLATIN CHEMO INJECTION 100 MG/20ML
88.0000 mg/m2 | Freq: Once | INTRAVENOUS | Status: AC
  Administered 2020-07-05: 150 mg via INTRAVENOUS
  Filled 2020-07-05: qty 10

## 2020-07-05 MED ORDER — SODIUM CHLORIDE 0.9% FLUSH
10.0000 mL | INTRAVENOUS | Status: DC | PRN
  Administered 2020-07-05: 10 mL
  Filled 2020-07-05: qty 10

## 2020-07-05 NOTE — Patient Instructions (Signed)
[facility] °Discharge Instructions for Patients Receiving Chemotherapy ° °Today you received the following chemotherapy agents: oxaliplatin, leucovorin, fluorouracil. ° °To help prevent nausea and vomiting after your treatment, we encourage you to take your nausea medication as prescribed by your physician.  °  °If you develop nausea and vomiting that is not controlled by your nausea medication, call the clinic.  ° °BELOW ARE SYMPTOMS THAT SHOULD BE REPORTED IMMEDIATELY: °· *FEVER GREATER THAN 100.5 F °· *CHILLS WITH OR WITHOUT FEVER °· NAUSEA AND VOMITING THAT IS NOT CONTROLLED WITH YOUR NAUSEA MEDICATION °· *UNUSUAL SHORTNESS OF BREATH °· *UNUSUAL BRUISING OR BLEEDING °· TENDERNESS IN MOUTH AND THROAT WITH OR WITHOUT PRESENCE OF ULCERS °· *URINARY PROBLEMS °· *BOWEL PROBLEMS °· UNUSUAL RASH °Items with * indicate a potential emergency and should be followed up as soon as possible. ° °Feel free to call the clinic should you have any questions or concerns. The clinic phone number is [phone]. ° °Please show the CHEMO ALERT CARD at check-in to the Emergency Department and triage nurse. ° °Oxaliplatin Injection °What is this medicine? °OXALIPLATIN (ox AL i PLA tin) is a chemotherapy drug. It targets fast dividing cells, like cancer cells, and causes these cells to die. This medicine is used to treat cancers of the colon and rectum, and many other cancers. °This medicine may be used for other purposes; ask your health care provider or pharmacist if you have questions. °COMMON BRAND NAME(S): Eloxatin °What should I tell my health care provider before I take this medicine? °They need to know if you have any of these conditions: °· heart disease °· history of irregular heartbeat °· liver disease °· low blood counts, like white cells, platelets, or red blood cells °· lung or breathing disease, like asthma °· take medicines that treat or prevent blood clots °· tingling of the fingers or toes, or other  nerve disorder °· an unusual or allergic reaction to oxaliplatin, other chemotherapy, other medicines, foods, dyes, or preservatives °· pregnant or trying to get pregnant °· breast-feeding °How should I use this medicine? °This drug is given as an infusion into a vein. It is administered in a hospital or clinic by a specially trained health care professional. °Talk to your pediatrician regarding the use of this medicine in children. Special care may be needed. °Overdosage: If you think you have taken too much of this medicine contact a poison control center or emergency room at once. °NOTE: This medicine is only for you. Do not share this medicine with others. °What if I miss a dose? °It is important not to miss a dose. Call your doctor or health care professional if you are unable to keep an appointment. °What may interact with this medicine? °Do not take this medicine with any of the following medications: °· cisapride °· dronedarone °· pimozide °· thioridazine °This medicine may also interact with the following medications: °· aspirin and aspirin-like medicines °· certain medicines that treat or prevent blood clots like warfarin, apixaban, dabigatran, and rivaroxaban °· cisplatin °· cyclosporine °· diuretics °· medicines for infection like acyclovir, adefovir, amphotericin B, bacitracin, cidofovir, foscarnet, ganciclovir, gentamicin, pentamidine, vancomycin °· NSAIDs, medicines for pain and inflammation, like ibuprofen or naproxen °· other medicines that prolong the QT interval (an abnormal heart rhythm) °· pamidronate °· zoledronic acid °This list may not describe all possible interactions. Give your health care provider a list of all the medicines, herbs, non-prescription drugs, or dietary supplements you use. Also tell them if   you smoke, drink alcohol, or use illegal drugs. Some items may interact with your medicine. °What should I watch for while using this medicine? °Your condition will be monitored  carefully while you are receiving this medicine. °You may need blood work done while you are taking this medicine. °This medicine may make you feel generally unwell. This is not uncommon as chemotherapy can affect healthy cells as well as cancer cells. Report any side effects. Continue your course of treatment even though you feel ill unless your healthcare professional tells you to stop. °This medicine can make you more sensitive to cold. Do not drink cold drinks or use ice. Cover exposed skin before coming in contact with cold temperatures or cold objects. When out in cold weather wear warm clothing and cover your mouth and nose to warm the air that goes into your lungs. Tell your doctor if you get sensitive to the cold. °Do not become pregnant while taking this medicine or for 9 months after stopping it. Women should inform their health care professional if they wish to become pregnant or think they might be pregnant. Men should not father a child while taking this medicine and for 6 months after stopping it. There is potential for serious side effects to an unborn child. Talk to your health care professional for more information. °Do not breast-feed a child while taking this medicine or for 3 months after stopping it. °This medicine has caused ovarian failure in some women. This medicine may make it more difficult to get pregnant. Talk to your health care professional if you are concerned about your fertility. °This medicine has caused decreased sperm counts in some men. This may make it more difficult to father a child. Talk to your health care professional if you are concerned about your fertility. °This medicine may increase your risk of getting an infection. Call your health care professional for advice if you get a fever, chills, or sore throat, or other symptoms of a cold or flu. Do not treat yourself. Try to avoid being around people who are sick. °Avoid taking medicines that contain aspirin,  acetaminophen, ibuprofen, naproxen, or ketoprofen unless instructed by your health care professional. These medicines may hide a fever. °Be careful brushing or flossing your teeth or using a toothpick because you may get an infection or bleed more easily. If you have any dental work done, tell your dentist you are receiving this medicine. °What side effects may I notice from receiving this medicine? °Side effects that you should report to your doctor or health care professional as soon as possible: °· allergic reactions like skin rash, itching or hives, swelling of the face, lips, or tongue °· breathing problems °· cough °· low blood counts - this medicine may decrease the number of white blood cells, red blood cells, and platelets. You may be at increased risk for infections and bleeding °· nausea, vomiting °· pain, redness, or irritation at site where injected °· pain, tingling, numbness in the hands or feet °· signs and symptoms of bleeding such as bloody or black, tarry stools; red or dark brown urine; spitting up blood or brown material that looks like coffee grounds; red spots on the skin; unusual bruising or bleeding from the eyes, gums, or nose °· signs and symptoms of a dangerous change in heartbeat or heart rhythm like chest pain; dizziness; fast, irregular heartbeat; palpitations; feeling faint or lightheaded; falls °· signs and symptoms of infection like fever; chills; cough; sore throat; pain or   trouble passing urine °· signs and symptoms of liver injury like dark yellow or brown urine; general ill feeling or flu-like symptoms; light-colored stools; loss of appetite; nausea; right upper belly pain; unusually weak or tired; yellowing of the eyes or skin °· signs and symptoms of low red blood cells or anemia such as unusually weak or tired; feeling faint or lightheaded; falls °· signs and symptoms of muscle injury like dark urine; trouble passing urine or change in the amount of urine; unusually weak or  tired; muscle pain; back pain °Side effects that usually do not require medical attention (report to your doctor or health care professional if they continue or are bothersome): °· changes in taste °· diarrhea °· gas °· hair loss °· loss of appetite °· mouth sores °This list may not describe all possible side effects. Call your doctor for medical advice about side effects. You may report side effects to FDA at 1-800-FDA-1088. °Where should I keep my medicine? °This drug is given in a hospital or clinic and will not be stored at home. °NOTE: This sheet is a summary. It may not cover all possible information. If you have questions about this medicine, talk to your doctor, pharmacist, or health care provider. °© 2020 [employer] (2019-03-25 12:20:35) ° °Leucovorin injection °What is this medicine? °LEUCOVORIN (loo koe VOR in) is used to prevent or treat the harmful effects of some medicines. This medicine is used to treat anemia caused by a low amount of folic acid in the body. It is also used with 5-fluorouracil (5-FU) to treat colon cancer. °This medicine may be used for other purposes; ask your health care provider or pharmacist if you have questions. °What should I tell my health care provider before I take this medicine? °They need to know if you have any of these conditions: °· anemia from low levels of vitamin B-12 in the blood °· an unusual or allergic reaction to leucovorin, folic acid, other medicines, foods, dyes, or preservatives °· pregnant or trying to get pregnant °· breast-feeding °How should I use this medicine? °This medicine is for injection into a muscle or into a vein. It is given by a health care professional in a hospital or clinic setting. °Talk to your pediatrician regarding the use of this medicine in children. Special care may be needed. °Overdosage: If you think you have taken too much of this medicine contact a poison control center or emergency room at once. °NOTE: This medicine  is only for you. Do not share this medicine with others. °What if I miss a dose? °This does not apply. °What may interact with this medicine? °· capecitabine °· fluorouracil °· phenobarbital °· phenytoin °· primidone °· trimethoprim-sulfamethoxazole °This list may not describe all possible interactions. Give your health care provider a list of all the medicines, herbs, non-prescription drugs, or dietary supplements you use. Also tell them if you smoke, drink alcohol, or use illegal drugs. Some items may interact with your medicine. °What should I watch for while using this medicine? °Your condition will be monitored carefully while you are receiving this medicine. °This medicine may increase the side effects of 5-fluorouracil, 5-FU. Tell your doctor or health care professional if you have diarrhea or mouth sores that do not get better or that get worse. °What side effects may I notice from receiving this medicine? °Side effects that you should report to your doctor or health care professional as soon as possible: °· allergic reactions like skin rash, itching or hives,   swelling of the face, lips, or tongue °· breathing problems °· fever, infection °· mouth sores °· unusual bleeding or bruising °· unusually weak or tired °Side effects that usually do not require medical attention (report to your doctor or health care professional if they continue or are bothersome): °· constipation or diarrhea °· loss of appetite °· nausea, vomiting °This list may not describe all possible side effects. Call your doctor for medical advice about side effects. You may report side effects to FDA at 1-800-FDA-1088. °Where should I keep my medicine? °This drug is given in a hospital or clinic and will not be stored at home. °NOTE: This sheet is a summary. It may not cover all possible information. If you have questions about this medicine, talk to your doctor, pharmacist, or health care provider. °© 2020 [employer] (2008-05-11  16:50:29) ° °Fluorouracil, 5-FU injection °What is this medicine? °FLUOROURACIL, 5-FU (flure oh YOOR a sil) is a chemotherapy drug. It slows the growth of cancer cells. This medicine is used to treat many types of cancer like breast cancer, colon or rectal cancer, pancreatic cancer, and stomach cancer. °This medicine may be used for other purposes; ask your health care provider or pharmacist if you have questions. °COMMON BRAND NAME(S): Adrucil °What should I tell my health care provider before I take this medicine? °They need to know if you have any of these conditions: °· blood disorders °· dihydropyrimidine dehydrogenase (DPD) deficiency °· infection (especially a virus infection such as chickenpox, cold sores, or herpes) °· kidney disease °· liver disease °· malnourished, poor nutrition °· recent or ongoing radiation therapy °· an unusual or allergic reaction to fluorouracil, other chemotherapy, other medicines, foods, dyes, or preservatives °· pregnant or trying to get pregnant °· breast-feeding °How should I use this medicine? °This drug is given as an infusion or injection into a vein. It is administered in a hospital or clinic by a specially trained health care professional. °Talk to your pediatrician regarding the use of this medicine in children. Special care may be needed. °Overdosage: If you think you have taken too much of this medicine contact a poison control center or emergency room at once. °NOTE: This medicine is only for you. Do not share this medicine with others. °What if I miss a dose? °It is important not to miss your dose. Call your doctor or health care professional if you are unable to keep an appointment. °What may interact with this medicine? °· allopurinol °· cimetidine °· dapsone °· digoxin °· hydroxyurea °· leucovorin °· levamisole °· medicines for seizures like ethotoin, fosphenytoin, phenytoin °· medicines to increase blood counts like filgrastim, pegfilgrastim,  sargramostim °· medicines that treat or prevent blood clots like warfarin, enoxaparin, and dalteparin °· methotrexate °· metronidazole °· pyrimethamine °· some other chemotherapy drugs like busulfan, cisplatin, estramustine, vinblastine °· trimethoprim °· trimetrexate °· vaccines °Talk to your doctor or health care professional before taking any of these medicines: °· acetaminophen °· aspirin °· ibuprofen °· ketoprofen °· naproxen °This list may not describe all possible interactions. Give your health care provider a list of all the medicines, herbs, non-prescription drugs, or dietary supplements you use. Also tell them if you smoke, drink alcohol, or use illegal drugs. Some items may interact with your medicine. °What should I watch for while using this medicine? °Visit your doctor for checks on your progress. This drug may make you feel generally unwell. This is not uncommon, as chemotherapy can affect healthy cells as well as cancer   cells. Report any side effects. Continue your course of treatment even though you feel ill unless your doctor tells you to stop. °In some cases, you may be given additional medicines to help with side effects. Follow all directions for their use. °Call your doctor or health care professional for advice if you get a fever, chills or sore throat, or other symptoms of a cold or flu. Do not treat yourself. This drug decreases your body's ability to fight infections. Try to avoid being around people who are sick. °This medicine may increase your risk to bruise or bleed. Call your doctor or health care professional if you notice any unusual bleeding. °Be careful brushing and flossing your teeth or using a toothpick because you may get an infection or bleed more easily. If you have any dental work done, tell your dentist you are receiving this medicine. °Avoid taking products that contain aspirin, acetaminophen, ibuprofen, naproxen, or ketoprofen unless instructed by your doctor. These  medicines may hide a fever. °Do not become pregnant while taking this medicine. Women should inform their doctor if they wish to become pregnant or think they might be pregnant. There is a potential for serious side effects to an unborn child. Talk to your health care professional or pharmacist for more information. Do not breast-feed an infant while taking this medicine. °Men should inform their doctor if they wish to father a child. This medicine may lower sperm counts. °Do not treat diarrhea with over the counter products. Contact your doctor if you have diarrhea that lasts more than 2 days or if it is severe and watery. °This medicine can make you more sensitive to the sun. Keep out of the sun. If you cannot avoid being in the sun, wear protective clothing and use sunscreen. Do not use sun lamps or tanning beds/booths. °What side effects may I notice from receiving this medicine? °Side effects that you should report to your doctor or health care professional as soon as possible: °· allergic reactions like skin rash, itching or hives, swelling of the face, lips, or tongue °· low blood counts - this medicine may decrease the number of white blood cells, red blood cells and platelets. You may be at increased risk for infections and bleeding. °· signs of infection - fever or chills, cough, sore throat, pain or difficulty passing urine °· signs of decreased platelets or bleeding - bruising, pinpoint red spots on the skin, black, tarry stools, blood in the urine °· signs of decreased red blood cells - unusually weak or tired, fainting spells, lightheadedness °· breathing problems °· changes in vision °· chest pain °· mouth sores °· nausea and vomiting °· pain, swelling, redness at site where injected °· pain, tingling, numbness in the hands or feet °· redness, swelling, or sores on hands or feet °· stomach pain °· unusual bleeding °Side effects that usually do not require medical attention (report to your doctor or  health care professional if they continue or are bothersome): °· changes in finger or toe nails °· diarrhea °· dry or itchy skin °· hair loss °· headache °· loss of appetite °· sensitivity of eyes to the light °· stomach upset °· unusually teary eyes °This list may not describe all possible side effects. Call your doctor for medical advice about side effects. You may report side effects to FDA at 1-800-FDA-1088. °Where should I keep my medicine? °This drug is given in a hospital or clinic and will not be stored at home. °NOTE: This   sheet is a summary. It may not cover all possible information. If you have questions about this medicine, talk to your doctor, pharmacist, or health care provider. °© 2020 [employer] (2008-03-10 13:53:16) ° ° ° ° °

## 2020-07-06 ENCOUNTER — Telehealth: Payer: Self-pay | Admitting: *Deleted

## 2020-07-07 ENCOUNTER — Other Ambulatory Visit: Payer: Self-pay

## 2020-07-07 ENCOUNTER — Inpatient Hospital Stay: Payer: Medicare Other

## 2020-07-07 VITALS — BP 148/61 | HR 100 | Temp 99.3°F | Resp 18

## 2020-07-07 DIAGNOSIS — C185 Malignant neoplasm of splenic flexure: Secondary | ICD-10-CM

## 2020-07-07 MED ORDER — SODIUM CHLORIDE 0.9% FLUSH
10.0000 mL | INTRAVENOUS | Status: DC | PRN
  Filled 2020-07-07: qty 10

## 2020-07-07 MED ORDER — HEPARIN SOD (PORK) LOCK FLUSH 100 UNIT/ML IV SOLN
500.0000 [IU] | Freq: Once | INTRAVENOUS | Status: DC | PRN
  Filled 2020-07-07: qty 5

## 2020-07-12 ENCOUNTER — Ambulatory Visit: Payer: Medicare Other | Admitting: Gastroenterology

## 2020-07-18 ENCOUNTER — Other Ambulatory Visit: Payer: Self-pay | Admitting: Oncology

## 2020-07-19 ENCOUNTER — Encounter: Payer: Self-pay | Admitting: Nurse Practitioner

## 2020-07-19 ENCOUNTER — Inpatient Hospital Stay: Payer: Medicare Other | Admitting: Nurse Practitioner

## 2020-07-19 ENCOUNTER — Other Ambulatory Visit: Payer: Self-pay

## 2020-07-19 ENCOUNTER — Inpatient Hospital Stay: Payer: Medicare Other

## 2020-07-19 ENCOUNTER — Other Ambulatory Visit: Payer: Self-pay | Admitting: Nurse Practitioner

## 2020-07-19 VITALS — BP 146/75 | HR 99 | Temp 98.1°F | Resp 18 | Ht 66.0 in | Wt 135.4 lb

## 2020-07-19 DIAGNOSIS — C185 Malignant neoplasm of splenic flexure: Secondary | ICD-10-CM

## 2020-07-19 DIAGNOSIS — Z95828 Presence of other vascular implants and grafts: Secondary | ICD-10-CM

## 2020-07-19 LAB — CMP (CANCER CENTER ONLY)
ALT: 26 U/L (ref 0–44)
AST: 20 U/L (ref 15–41)
Albumin: 3.1 g/dL — ABNORMAL LOW (ref 3.5–5.0)
Alkaline Phosphatase: 122 U/L (ref 38–126)
Anion gap: 6 (ref 5–15)
BUN: 15 mg/dL (ref 8–23)
CO2: 29 mmol/L (ref 22–32)
Calcium: 9.9 mg/dL (ref 8.9–10.3)
Chloride: 105 mmol/L (ref 98–111)
Creatinine: 0.76 mg/dL (ref 0.44–1.00)
GFR, Est AFR Am: >60 mL/min (ref >60)
GFR, Estimated: >60 mL/min (ref >60)
Glucose, Bld: 120 mg/dL — ABNORMAL HIGH (ref 70–99)
Potassium: 4.1 mmol/L (ref 3.5–5.1)
Sodium: 140 mmol/L (ref 135–145)
Total Bilirubin: <0.2 mg/dL — ABNORMAL LOW (ref 0.3–1.2)
Total Protein: 7.1 g/dL (ref 6.5–8.1)

## 2020-07-19 LAB — CBC WITH DIFFERENTIAL (CANCER CENTER ONLY)
Abs Immature Granulocytes: 0.01 10*3/uL (ref 0.00–0.07)
Basophils Absolute: 0 10*3/uL (ref 0.0–0.1)
Basophils Relative: 1 %
Eosinophils Absolute: 0.1 10*3/uL (ref 0.0–0.5)
Eosinophils Relative: 2 %
HCT: 36.1 % (ref 36.0–46.0)
Hemoglobin: 11.9 g/dL — ABNORMAL LOW (ref 12.0–15.0)
Immature Granulocytes: 0 %
Lymphocytes Relative: 30 %
Lymphs Abs: 1.1 10*3/uL (ref 0.7–4.0)
MCH: 31.5 pg (ref 26.0–34.0)
MCHC: 33 g/dL (ref 30.0–36.0)
MCV: 95.5 fL (ref 80.0–100.0)
Monocytes Absolute: 0.3 10*3/uL (ref 0.1–1.0)
Monocytes Relative: 9 %
Neutro Abs: 2.2 10*3/uL (ref 1.7–7.7)
Neutrophils Relative %: 58 %
Platelet Count: 394 10*3/uL (ref 150–400)
RBC: 3.78 MIL/uL — ABNORMAL LOW (ref 3.87–5.11)
RDW: 14.6 % (ref 11.5–15.5)
WBC Count: 3.8 10*3/uL — ABNORMAL LOW (ref 4.0–10.5)
nRBC: 0 % (ref 0.0–0.2)

## 2020-07-19 LAB — TOTAL PROTEIN, URINE DIPSTICK: Protein, ur: <30 mg/dL — AB

## 2020-07-19 MED ORDER — HEPARIN SOD (PORK) LOCK FLUSH 100 UNIT/ML IV SOLN
500.0000 [IU] | Freq: Once | INTRAVENOUS | Status: DC | PRN
  Filled 2020-07-19: qty 5

## 2020-07-19 MED ORDER — SODIUM CHLORIDE 0.9% FLUSH
10.0000 mL | Freq: Once | INTRAVENOUS | Status: AC
  Administered 2020-07-19: 10 mL
  Filled 2020-07-19: qty 10

## 2020-07-19 MED ORDER — ATROPINE SULFATE 1 MG/ML IJ SOLN
0.5000 mg | Freq: Once | INTRAMUSCULAR | Status: AC | PRN
  Administered 2020-07-19: 0.5 mg via INTRAVENOUS

## 2020-07-19 MED ORDER — SODIUM CHLORIDE 0.9 % IV SOLN
2000.0000 mg/m2 | INTRAVENOUS | Status: DC
  Administered 2020-07-19: 3400 mg via INTRAVENOUS
  Filled 2020-07-19: qty 68

## 2020-07-19 MED ORDER — LEUCOVORIN CALCIUM INJECTION 350 MG
400.0000 mg/m2 | Freq: Once | INTRAVENOUS | Status: AC
  Administered 2020-07-19: 676 mg via INTRAVENOUS
  Filled 2020-07-19: qty 33.8

## 2020-07-19 MED ORDER — SODIUM CHLORIDE 0.9 % IV SOLN
150.0000 mg | Freq: Once | INTRAVENOUS | Status: AC
  Administered 2020-07-19: 150 mg via INTRAVENOUS
  Filled 2020-07-19: qty 150

## 2020-07-19 MED ORDER — DEXTROSE 5 % IV SOLN
Freq: Once | INTRAVENOUS | Status: AC
  Filled 2020-07-19: qty 250

## 2020-07-19 MED ORDER — PALONOSETRON HCL INJECTION 0.25 MG/5ML
INTRAVENOUS | Status: AC
  Filled 2020-07-19: qty 5

## 2020-07-19 MED ORDER — OXALIPLATIN CHEMO INJECTION 100 MG/20ML
88.0000 mg/m2 | Freq: Once | INTRAVENOUS | Status: AC
  Administered 2020-07-19: 150 mg via INTRAVENOUS
  Filled 2020-07-19: qty 20

## 2020-07-19 MED ORDER — ATROPINE SULFATE 1 MG/ML IJ SOLN
INTRAMUSCULAR | Status: AC
  Filled 2020-07-19: qty 1

## 2020-07-19 MED ORDER — SODIUM CHLORIDE 0.9% FLUSH
10.0000 mL | INTRAVENOUS | Status: DC | PRN
  Filled 2020-07-19: qty 10

## 2020-07-19 MED ORDER — PALONOSETRON HCL INJECTION 0.25 MG/5ML
0.2500 mg | Freq: Once | INTRAVENOUS | Status: AC
  Administered 2020-07-19: 0.25 mg via INTRAVENOUS

## 2020-07-19 MED ORDER — SODIUM CHLORIDE 0.9 % IV SOLN
150.0000 mg/m2 | Freq: Once | INTRAVENOUS | Status: AC
  Administered 2020-07-19: 260 mg via INTRAVENOUS
  Filled 2020-07-19: qty 13

## 2020-07-19 MED ORDER — SODIUM CHLORIDE 0.9 % IV SOLN
Freq: Once | INTRAVENOUS | Status: AC
  Filled 2020-07-19: qty 250

## 2020-07-19 MED ORDER — SODIUM CHLORIDE 0.9 % IV SOLN
10.0000 mg | Freq: Once | INTRAVENOUS | Status: AC
  Administered 2020-07-19: 10 mg via INTRAVENOUS
  Filled 2020-07-19: qty 10

## 2020-07-19 MED ORDER — SODIUM CHLORIDE 0.9 % IV SOLN
5.0000 mg/kg | Freq: Once | INTRAVENOUS | Status: AC
  Administered 2020-07-19: 300 mg via INTRAVENOUS
  Filled 2020-07-19: qty 12

## 2020-07-19 NOTE — Progress Notes (Addendum)
[facility]   [phone] Fax:([phone]) [phone]   Clinic Follow up Note   Patient Care Team: Rankins, Fanny Dance, MD as PCP - General (Family Medicine) Croitoru, Rachelle Hora, MD as PCP - Cardiology (Cardiology) Radonna Ricker, RN as Oncology Nurse Navigator Ladene Artist, MD as Consulting Physician (Oncology) 07/19/2020  CHIEF COMPLAINT: Follow-up colon cancer  CURRENT THERAPY: FOLFOX with cycle 1, plan to increase to FOLFOXIRI and Avastin with cycle 2  INTERVAL HISTORY: Kathy Robinson returns for follow-up and treatment as scheduled.  She completed first cycle on 07/05/2020.  She had one episode of cold sensitivity, no residual neuropathy.  She was mildly fatigued for 1-2 days but remained out of bed and active at home.  She is independent with ADLs, light housework and meals.  She continues a stool softener and MiraLAX daily which maintains normal bowel movement, no nausea or vomiting.  Denies rash, mucositis, bleeding, pain, fever, chills, cough, chest pain, dyspnea, leg edema.  She continues wearing a heart monitor.   MEDICAL HISTORY:  Past Medical History:  Diagnosis Date  . Atrial fibrillation (HCC)    on ekg 05-27-2020  . Cancer (HCC)  most recent 01/16/2013   h/o basal cell, squamous cell ca rt upper lip  . Colon cancer (HCC) 2021  . Hyperlipemia     SURGICAL HISTORY: Past Surgical History:  Procedure Laterality Date  . COLON RESECTION N/A 05/30/2020   Procedure: OPEN PARTIAL COLECTOMY;  Surgeon: Sheliah Hatch De Blanch, MD;  Location: [facility];  Service: General;  Laterality: N/A;  . COLONOSCOPY  02/00   Dr. Arlyce Dice  . PORTACATH PLACEMENT Right 06/20/2020   Procedure: INSERTION PORT-A-CATH WITH ULTRASOUND;  Surgeon: Kinsinger, De Blanch, MD;  Location: [facility];  Service: General;  Laterality: Right;    I have reviewed the social history and family history with the patient and they are unchanged from previous note.  ALLERGIES:  is  allergic to codeine, neosporin [neomycin-bacitracin zn-polymyx], and tape.  MEDICATIONS:  Current Outpatient Medications  Medication Sig Dispense Refill  . acetaminophen (TYLENOL) 500 MG tablet Take 2 tablets (1,000 mg total) by mouth every 6 (six) hours. (Patient taking differently: Take 1,000 mg by mouth every 6 (six) hours. Takes 1 to 2) 30 tablet 0  . docusate sodium (COLACE) 100 MG capsule Take 100 mg by mouth at bedtime.    . feeding supplement, ENSURE ENLIVE, (ENSURE ENLIVE) LIQD Take 237 mLs by mouth 2 (two) times daily between meals. 237 mL 12  . lidocaine-prilocaine (EMLA) cream Apply 1 application topically as needed. 30 g 0  . Melatonin 5 MG CAPS Take by mouth at bedtime.    . Multiple Vitamins-Minerals (MULTI COMPLETE PO) Take by mouth. One a day 50 plus    . Multiple Vitamins-Minerals (VITAMIN D3 COMPLETE PO) Take 2,000 Units by mouth daily.    . ondansetron (ZOFRAN) 8 MG tablet Take 1 tablet (8 mg total) by mouth every 8 (eight) hours as needed for nausea or vomiting. Start 72 hours after IV Chemo 30 tablet 1  . Polyethylene Glycol 3350 (MIRALAX PO) Take 17 g by mouth daily.    . pravastatin (PRAVACHOL) 20 MG tablet Take 20 mg by mouth at bedtime.     . prochlorperazine (COMPAZINE) 10 MG tablet Take 1 tablet (10 mg total) by mouth every 6 (six) hours as needed for nausea or vomiting. 30 tablet 0   No current facility-administered medications for this visit.    PHYSICAL EXAMINATION: ECOG PERFORMANCE  STATUS: 0 - Asymptomatic  Vitals:   07/19/20 1001  BP: (!) 146/75  Pulse: 99  Resp: 18  Temp: 98.1 F (36.7 C)  SpO2: 100%   Filed Weights   07/19/20 1001  Weight: 135 lb 6.4 oz (61.4 kg)    GENERAL:alert, no distress and comfortable SKIN: No rash to exposed skin EYES: sclera clear LUNGS: clear with normal breathing effort HEART: Afib, wearing heart monitor. Regular rate. No lower extremity edema ABDOMEN:abdomen soft, non-tender and normal bowel sounds NEURO: alert  & oriented x 3 with fluent speech, normal gait PAC without erythema   LABORATORY DATA:  I have reviewed the data as listed CBC Latest Ref Rng & Units 07/19/2020 06/29/2020 06/20/2020  WBC 4.0 - 10.5 K/uL 3.8(L) 9.1 -  Hemoglobin 12.0 - 15.0 g/dL 11.9(L) 12.4 14.3  Hematocrit 36 - 46 % 36.1 37.7 42.0  Platelets 150 - 400 K/uL 394 309 -     CMP Latest Ref Rng & Units 07/19/2020 06/29/2020 06/20/2020  Glucose 70 - 99 mg/dL 120(H) 100(H) 107(H)  BUN 8 - 23 mg/dL $Remove'15 16 19  'WckAhBF$ Creatinine 0.44 - 1.00 mg/dL 0.76 0.77 0.60  Sodium 135 - 145 mmol/L 140 139 141  Potassium 3.5 - 5.1 mmol/L 4.1 3.9 4.1  Chloride 98 - 111 mmol/L 105 105 104  CO2 22 - 32 mmol/L 29 24 -  Calcium 8.9 - 10.3 mg/dL 9.9 10.0 -  Total Protein 6.5 - 8.1 g/dL 7.1 7.3 -  Total Bilirubin 0.3 - 1.2 mg/dL <0.2(L) 0.3 -  Alkaline Phos 38 - 126 U/L 122 87 -  AST 15 - 41 U/L 20 43(H) -  ALT 0 - 44 U/L 26 24 -      RADIOGRAPHIC STUDIES: I have personally reviewed the radiological images as listed and agreed with the findings in the report. No results found.   ASSESSMENT & PLAN:   1. Adenocarcinoma the left colon, stage IIIc (pT4b,pN2a), status post a left colectomy 05/30/2020, MSS, TMB 13, BRAF V600E ? Tumor invades the visceral peritoneum, lymphovascular and perineural invasion present, for tumor deposits, 7/13 lymph nodes, negative resection margins, no loss of mismatch repair protein expression ? CT abdomen/pelvis 05/26/2020-long segment of masslike thickening of the descending colon with associated high-grade colonic obstruction, small colonic wall defect with evidence of a focally contained microperforation, retroperitoneal adenopathy, indeterminate small hypodense liver lesions ? Elevated preoperative CEA ? PET 06/22/2020-hypermetabolic liver metastases, periportal and periaortic adenopathy, hypermetabolic mediastinal and left supraclavicular nodes ? Cycle 1 FOLFOX 07/05/20  ? Cycle 2 FOLFOXIRI (dose-reduced 5FU, no bolus) and  Avastin 07/19/20  2. Atrial fibrillation with rapid ventricular response 05/26/2020   Disposition:  Kathy Robinson appears stable. She completed cycle 1 FOLFOX, she tolerated very well with mild fatigue. Weight is stable. She was able to function and recover well.   The patient was seen with Dr. Benay Spice who recommends adding irinotecan and biologic agent bevacizumab for better disease control. We discussed the potential side effects with irinotecan including diarrhea and hair loss. We discussed the potential side effects with bevacizumab including elevated blood pressure, proteinuria, bleeding, bowel perforation, delayed wound healing, and CNS toxicities. She is 7 weeks out from surgery. She agrees to proceed.   She currently uses stool softener and miralax daily, I cautioned her on this. She will hold for now and get imodium in the event she develops diarrhea.   The CBC and CMP were reviewed. The plan is to proceed with cycle 2 today with the addition of  irinotecan and bevacizumab. The 5FU will be dose-reduced.   She will return for f/u and cycle 3 in 2 weeks.   Orders Placed This Encounter  Procedures  . CBC with Differential (Cancer Center Only)    Standing Status:   Future    Standing Expiration Date:   07/19/2021  . CMP ([facility] only)    Standing Status:   Future    Standing Expiration Date:   07/19/2021   All questions were answered. The patient knows to call the clinic with any problems, questions or concerns. No barriers to learning were detected.     Alla Feeling, NP 07/19/20  This was a shared visit with Cira Rue.  Kathy Robinson tolerated the first cycle of FOLFOX well.  I recommend adding irinotecan and bevacizumab to the systemic therapy regimen.  This is due to the extensive metastatic disease and BRAF mutation.  We reviewed potential toxicities associated with irinotecan and bevacizumab.  She agrees to proceed.  Julieanne Manson, MD

## 2020-07-19 NOTE — Progress Notes (Signed)
1302 Pt reports Runny nose. Irinotecan infusion paused. Atropine given. 1306 symptoms resolved. Infusion restarted.

## 2020-07-19 NOTE — Patient Instructions (Signed)
[facility] Discharge Instructions for Patients Receiving Chemotherapy  Today you received the following chemotherapy agents: oxaliplatin, leucovorin, fluorouracil, Irinotecan  To help prevent nausea and vomiting after your treatment, we encourage you to take your nausea medication as prescribed by your physician.    If you develop nausea and vomiting that is not controlled by your nausea medication, call the clinic.   BELOW ARE SYMPTOMS THAT SHOULD BE REPORTED IMMEDIATELY:  *FEVER GREATER THAN 100.5 F  *CHILLS WITH OR WITHOUT FEVER  NAUSEA AND VOMITING THAT IS NOT CONTROLLED WITH YOUR NAUSEA MEDICATION  *UNUSUAL SHORTNESS OF BREATH  *UNUSUAL BRUISING OR BLEEDING  TENDERNESS IN MOUTH AND THROAT WITH OR WITHOUT PRESENCE OF ULCERS  *URINARY PROBLEMS  *BOWEL PROBLEMS  UNUSUAL RASH Items with * indicate a potential emergency and should be followed up as soon as possible.  Feel free to call the clinic should you have any questions or concerns. The clinic phone number is [phone].  Please show the [place] at check-in to the Emergency Department and triage nurse.  Irinotecan injection What is this medicine? IRINOTECAN (ir in oh TEE kan ) is a chemotherapy drug. It is used to treat colon and rectal cancer. This medicine may be used for other purposes; ask your health care provider or pharmacist if you have questions. COMMON BRAND NAME(S): Camptosar What should I tell my health care provider before I take this medicine? They need to know if you have any of these conditions:  dehydration  diarrhea  infection (especially a virus infection such as chickenpox, cold sores, or herpes)  liver disease  low blood counts, like low white cell, platelet, or red cell counts  low levels of calcium, magnesium, or potassium in the blood  recent or ongoing radiation therapy  an unusual or allergic reaction to irinotecan, other medicines, foods, dyes, or  preservatives  pregnant or trying to get pregnant  breast-feeding How should I use this medicine? This drug is given as an infusion into a vein. It is administered in a hospital or clinic by a specially trained health care professional. Talk to your pediatrician regarding the use of this medicine in children. Special care may be needed. Overdosage: If you think you have taken too much of this medicine contact a poison control center or emergency room at once. NOTE: This medicine is only for you. Do not share this medicine with others. What if I miss a dose? It is important not to miss your dose. Call your doctor or health care professional if you are unable to keep an appointment. What may interact with this medicine? This medicine may interact with the following medications:  antiviral medicines for HIV or AIDS  certain antibiotics like rifampin or rifabutin  certain medicines for fungal infections like itraconazole, ketoconazole, posaconazole, and voriconazole  certain medicines for seizures like carbamazepine, phenobarbital, phenotoin  clarithromycin  gemfibrozil  nefazodone  St. John's Wort This list may not describe all possible interactions. Give your health care provider a list of all the medicines, herbs, non-prescription drugs, or dietary supplements you use. Also tell them if you smoke, drink alcohol, or use illegal drugs. Some items may interact with your medicine. What should I watch for while using this medicine? Your condition will be monitored carefully while you are receiving this medicine. You will need important blood work done while you are taking this medicine. This drug may make you feel generally unwell. This is not uncommon, as chemotherapy can affect healthy  cells as well as cancer cells. Report any side effects. Continue your course of treatment even though you feel ill unless your doctor tells you to stop. In some cases, you may be given additional  medicines to help with side effects. Follow all directions for their use. You may get drowsy or dizzy. Do not drive, use machinery, or do anything that needs mental alertness until you know how this medicine affects you. Do not stand or sit up quickly, especially if you are an older patient. This reduces the risk of dizzy or fainting spells. Call your health care professional for advice if you get a fever, chills, or sore throat, or other symptoms of a cold or flu. Do not treat yourself. This medicine decreases your body's ability to fight infections. Try to avoid being around people who are sick. Avoid taking products that contain aspirin, acetaminophen, ibuprofen, naproxen, or ketoprofen unless instructed by your doctor. These medicines may hide a fever. This medicine may increase your risk to bruise or bleed. Call your doctor or health care professional if you notice any unusual bleeding. Be careful brushing and flossing your teeth or using a toothpick because you may get an infection or bleed more easily. If you have any dental work done, tell your dentist you are receiving this medicine. Do not become pregnant while taking this medicine or for 6 months after stopping it. Women should inform their health care professional if they wish to become pregnant or think they might be pregnant. Men should not father a child while taking this medicine and for 3 months after stopping it. There is potential for serious side effects to an unborn child. Talk to your health care professional for more information. Do not breast-feed an infant while taking this medicine or for 7 days after stopping it. This medicine has caused ovarian failure in some women. This medicine may make it more difficult to get pregnant. Talk to your health care professional if you are concerned about your fertility. This medicine has caused decreased sperm counts in some men. This may make it more difficult to father a child. Talk to your  health care professional if you are concerned about your fertility. What side effects may I notice from receiving this medicine? Side effects that you should report to your doctor or health care professional as soon as possible:  allergic reactions like skin rash, itching or hives, swelling of the face, lips, or tongue  chest pain  diarrhea  flushing, runny nose, sweating during infusion  low blood counts - this medicine may decrease the number of white blood cells, red blood cells and platelets. You may be at increased risk for infections and bleeding.  nausea, vomiting  pain, swelling, warmth in the leg  signs of decreased platelets or bleeding - bruising, pinpoint red spots on the skin, black, tarry stools, blood in the urine  signs of infection - fever or chills, cough, sore throat, pain or difficulty passing urine  signs of decreased red blood cells - unusually weak or tired, fainting spells, lightheadedness Side effects that usually do not require medical attention (report to your doctor or health care professional if they continue or are bothersome):  constipation  hair loss  headache  loss of appetite  mouth sores  stomach pain This list may not describe all possible side effects. Call your doctor for medical advice about side effects. You may report side effects to FDA at 1-800-FDA-1088. Where should I keep my medicine? This drug  is given in a hospital or clinic and will not be stored at home. NOTE: This sheet is a summary. It may not cover all possible information. If you have questions about this medicine, talk to your doctor, pharmacist, or health care provider.  2020 Elsevier/Gold Standard (2018-12-26 10:09:17)

## 2020-07-20 ENCOUNTER — Telehealth: Payer: Self-pay | Admitting: *Deleted

## 2020-07-20 NOTE — Telephone Encounter (Signed)
Called patient to f/u on status since 1st tx yesterday. She reports extreme fatigue yesterday evening, but better today. No N/V or diarrhea. Felt her lip and eyelids "drawing up" yesterday, but it resolved in an hour. Intermittent cramps in hands and calves, but this is improving as well. Overall, she feels she is doing well. Instructed her to call w/any questions or adverse events she is not able to manage.

## 2020-07-21 ENCOUNTER — Inpatient Hospital Stay: Payer: Medicare Other | Attending: Oncology

## 2020-07-21 ENCOUNTER — Telehealth: Payer: Self-pay | Admitting: Oncology

## 2020-07-21 ENCOUNTER — Other Ambulatory Visit: Payer: Self-pay

## 2020-07-21 DIAGNOSIS — Z5111 Encounter for antineoplastic chemotherapy: Secondary | ICD-10-CM | POA: Insufficient documentation

## 2020-07-21 DIAGNOSIS — Z5189 Encounter for other specified aftercare: Secondary | ICD-10-CM | POA: Insufficient documentation

## 2020-07-21 DIAGNOSIS — D709 Neutropenia, unspecified: Secondary | ICD-10-CM | POA: Insufficient documentation

## 2020-07-21 DIAGNOSIS — I4891 Unspecified atrial fibrillation: Secondary | ICD-10-CM | POA: Insufficient documentation

## 2020-07-21 DIAGNOSIS — Z23 Encounter for immunization: Secondary | ICD-10-CM | POA: Insufficient documentation

## 2020-07-21 DIAGNOSIS — C185 Malignant neoplasm of splenic flexure: Secondary | ICD-10-CM

## 2020-07-21 DIAGNOSIS — C787 Secondary malignant neoplasm of liver and intrahepatic bile duct: Secondary | ICD-10-CM | POA: Insufficient documentation

## 2020-07-21 MED ORDER — SODIUM CHLORIDE 0.9% FLUSH
10.0000 mL | INTRAVENOUS | Status: DC | PRN
  Administered 2020-07-21: 10 mL
  Filled 2020-07-21: qty 10

## 2020-07-21 MED ORDER — HEPARIN SOD (PORK) LOCK FLUSH 100 UNIT/ML IV SOLN
500.0000 [IU] | Freq: Once | INTRAVENOUS | Status: AC | PRN
  Administered 2020-07-21: 500 [IU]
  Filled 2020-07-21: qty 5

## 2020-07-21 NOTE — Telephone Encounter (Signed)
Scheduled appointments per 8/31 los. Patient is aware of appointments dates and times.  

## 2020-07-27 MED FILL — PRAVASTATIN SODIUM 20 MG TA: 20 | 90 days supply | Qty: 90 | Fill #0

## 2020-07-31 ENCOUNTER — Other Ambulatory Visit: Payer: Self-pay | Admitting: Oncology

## 2020-08-02 ENCOUNTER — Inpatient Hospital Stay: Payer: Medicare Other

## 2020-08-02 ENCOUNTER — Inpatient Hospital Stay: Payer: Medicare Other | Admitting: Nurse Practitioner

## 2020-08-02 ENCOUNTER — Encounter: Payer: Self-pay | Admitting: Nurse Practitioner

## 2020-08-02 ENCOUNTER — Other Ambulatory Visit: Payer: Self-pay

## 2020-08-02 VITALS — BP 111/64 | HR 70 | Temp 98.4°F | Resp 18

## 2020-08-02 VITALS — BP 141/75 | HR 77 | Temp 99.3°F | Resp 18 | Ht 66.0 in | Wt 132.7 lb

## 2020-08-02 DIAGNOSIS — Z95828 Presence of other vascular implants and grafts: Secondary | ICD-10-CM

## 2020-08-02 DIAGNOSIS — C185 Malignant neoplasm of splenic flexure: Secondary | ICD-10-CM

## 2020-08-02 LAB — CBC WITH DIFFERENTIAL (CANCER CENTER ONLY)
Abs Immature Granulocytes: 0.01 10*3/uL (ref 0.00–0.07)
Basophils Absolute: 0 10*3/uL (ref 0.0–0.1)
Basophils Relative: 1 %
Eosinophils Absolute: 0.2 10*3/uL (ref 0.0–0.5)
Eosinophils Relative: 5 %
HCT: 36.6 % (ref 36.0–46.0)
Hemoglobin: 12.3 g/dL (ref 12.0–15.0)
Immature Granulocytes: 0 %
Lymphocytes Relative: 29 %
Lymphs Abs: 1.3 10*3/uL (ref 0.7–4.0)
MCH: 31.7 pg (ref 26.0–34.0)
MCHC: 33.6 g/dL (ref 30.0–36.0)
MCV: 94.3 fL (ref 80.0–100.0)
Monocytes Absolute: 0.5 10*3/uL (ref 0.1–1.0)
Monocytes Relative: 12 %
Neutro Abs: 2.4 10*3/uL (ref 1.7–7.7)
Neutrophils Relative %: 53 %
Platelet Count: 200 10*3/uL (ref 150–400)
RBC: 3.88 MIL/uL (ref 3.87–5.11)
RDW: 15.1 % (ref 11.5–15.5)
WBC Count: 4.5 10*3/uL (ref 4.0–10.5)
nRBC: 0 % (ref 0.0–0.2)

## 2020-08-02 LAB — CMP (CANCER CENTER ONLY)
ALT: 20 U/L (ref 0–44)
AST: 24 U/L (ref 15–41)
Albumin: 3.3 g/dL — ABNORMAL LOW (ref 3.5–5.0)
Alkaline Phosphatase: 93 U/L (ref 38–126)
Anion gap: 7 (ref 5–15)
BUN: 17 mg/dL (ref 8–23)
CO2: 28 mmol/L (ref 22–32)
Calcium: 9.3 mg/dL (ref 8.9–10.3)
Chloride: 106 mmol/L (ref 98–111)
Creatinine: 0.77 mg/dL (ref 0.44–1.00)
GFR, Est AFR Am: >60 mL/min (ref >60)
GFR, Estimated: >60 mL/min (ref >60)
Glucose, Bld: 106 mg/dL — ABNORMAL HIGH (ref 70–99)
Potassium: 4 mmol/L (ref 3.5–5.1)
Sodium: 141 mmol/L (ref 135–145)
Total Bilirubin: 0.3 mg/dL (ref 0.3–1.2)
Total Protein: 6.8 g/dL (ref 6.5–8.1)

## 2020-08-02 MED ORDER — SODIUM CHLORIDE 0.9 % IV SOLN
5.0000 mg/kg | Freq: Once | INTRAVENOUS | Status: AC
  Administered 2020-08-02: 300 mg via INTRAVENOUS
  Filled 2020-08-02: qty 12

## 2020-08-02 MED ORDER — SODIUM CHLORIDE 0.9% FLUSH
10.0000 mL | Freq: Once | INTRAVENOUS | Status: AC
  Administered 2020-08-02: 10 mL
  Filled 2020-08-02: qty 10

## 2020-08-02 MED ORDER — ATROPINE SULFATE 1 MG/ML IJ SOLN
INTRAMUSCULAR | Status: AC
  Filled 2020-08-02: qty 1

## 2020-08-02 MED ORDER — PALONOSETRON HCL INJECTION 0.25 MG/5ML
0.2500 mg | Freq: Once | INTRAVENOUS | Status: AC
  Administered 2020-08-02: 0.25 mg via INTRAVENOUS

## 2020-08-02 MED ORDER — OXALIPLATIN CHEMO INJECTION 100 MG/20ML
88.0000 mg/m2 | Freq: Once | INTRAVENOUS | Status: AC
  Administered 2020-08-02: 150 mg via INTRAVENOUS
  Filled 2020-08-02: qty 20

## 2020-08-02 MED ORDER — PALONOSETRON HCL INJECTION 0.25 MG/5ML
INTRAVENOUS | Status: AC
  Filled 2020-08-02: qty 5

## 2020-08-02 MED ORDER — SODIUM CHLORIDE 0.9 % IV SOLN
150.0000 mg/m2 | Freq: Once | INTRAVENOUS | Status: AC
  Administered 2020-08-02: 260 mg via INTRAVENOUS
  Filled 2020-08-02: qty 13

## 2020-08-02 MED ORDER — SODIUM CHLORIDE 0.9 % IV SOLN
10.0000 mg | Freq: Once | INTRAVENOUS | Status: AC
  Administered 2020-08-02: 10 mg via INTRAVENOUS
  Filled 2020-08-02: qty 10

## 2020-08-02 MED ORDER — DEXTROSE 5 % IV SOLN
Freq: Once | INTRAVENOUS | Status: AC
  Filled 2020-08-02: qty 250

## 2020-08-02 MED ORDER — SODIUM CHLORIDE 0.9 % IV SOLN
2000.0000 mg/m2 | INTRAVENOUS | Status: DC
  Administered 2020-08-02: 3400 mg via INTRAVENOUS
  Filled 2020-08-02: qty 68

## 2020-08-02 MED ORDER — LEUCOVORIN CALCIUM INJECTION 350 MG
400.0000 mg/m2 | Freq: Once | INTRAVENOUS | Status: AC
  Administered 2020-08-02: 676 mg via INTRAVENOUS
  Filled 2020-08-02: qty 33.8

## 2020-08-02 MED ORDER — ATROPINE SULFATE 1 MG/ML IJ SOLN
0.5000 mg | Freq: Once | INTRAMUSCULAR | Status: AC | PRN
  Administered 2020-08-02: 0.5 mg via INTRAVENOUS

## 2020-08-02 MED ORDER — SODIUM CHLORIDE 0.9 % IV SOLN
Freq: Once | INTRAVENOUS | Status: AC
  Filled 2020-08-02: qty 250

## 2020-08-02 MED ORDER — SODIUM CHLORIDE 0.9 % IV SOLN
150.0000 mg | Freq: Once | INTRAVENOUS | Status: AC
  Administered 2020-08-02: 150 mg via INTRAVENOUS
  Filled 2020-08-02: qty 150

## 2020-08-02 MED ORDER — HEPARIN SOD (PORK) LOCK FLUSH 100 UNIT/ML IV SOLN
500.0000 [IU] | Freq: Once | INTRAVENOUS | Status: DC
  Filled 2020-08-02: qty 5

## 2020-08-02 NOTE — Progress Notes (Signed)
  [facility] OFFICE PROGRESS NOTE   Diagnosis:  Colon cancer  INTERVAL HISTORY:   Ms. Zorn returns as scheduled.  She completed a cycle of FOLFOXIRI and Avastin 07/19/2020.  She denies nausea/vomiting.  No mouth sores.  No diarrhea.  No hand or foot pain or redness.  Cold sensitivity lasted about 3 days.  She noted that she was "weaker" for a few days.  She had cramping in the calves and hands 1 day.  Calves were sore for a few days after that.  She noted a small amount of blood with nose blowing over the past weekend.  No other bleeding.  She denies fever, cough, shortness of breath.  Objective:  Vital signs in last 24 hours:  Blood pressure (!) 141/75, pulse 77, temperature 99.3 F (37.4 C), resp. rate 18, height $RemoveBe'5\' 6"'ZFqReuXsM$  (1.676 m), weight 132 lb 11.2 oz (60.2 kg), last menstrual period 11/19/1996, SpO2 99 %.    HEENT: No thrush or ulcers. Resp: Lungs clear bilaterally. Cardio: Regular rate and rhythm. GI: Abdomen soft and nontender.  No hepatomegaly. Vascular: No leg edema.  Calves soft and nontender.  Skin: Palms without erythema. Port-A-Cath without erythema.   Lab Results:  Lab Results  Component Value Date   WBC 4.5 08/02/2020   HGB 12.3 08/02/2020   HCT 36.6 08/02/2020   MCV 94.3 08/02/2020   PLT 200 08/02/2020   NEUTROABS 2.4 08/02/2020    Imaging:  No results found.  Medications: I have reviewed the patient's current medications.  Assessment/Plan: 1. Adenocarcinoma the left colon, stage IIIc (pT4b,pN2a), status post a left colectomy 05/30/2020, MSS, TMB 13, BRAF V600E ? Tumor invades the visceral peritoneum, lymphovascular and perineural invasion present, for tumor deposits, 7/13 lymph nodes, negative resection margins, no loss of mismatch repair protein expression ? CT abdomen/pelvis 05/26/2020-long segment of masslike thickening of the descending colon with associated high-grade colonic obstruction, small colonic wall defect with evidence of a  focally contained microperforation, retroperitoneal adenopathy, indeterminate small hypodense liver lesions ? Elevated preoperative CEA ? PET8/02/2020-hypermetabolic liver metastases, periportal and periaortic adenopathy, hypermetabolic mediastinal and left supraclavicular nodes ? Cycle 1 FOLFOX 07/05/20  ? Cycle 2 FOLFOXIRI (dose-reduced 5FU, no bolus) and Avastin 07/19/20  ? Cycle 3 FOLFOXIRI (Dose reduced 5-FU, no bolus)/Avastin 08/02/2020 2. Atrial fibrillation with rapid ventricular response 05/26/2020   Disposition: Ms. Maravilla appears stable.  She tolerated the second cycle of systemic therapy well.  Plan to proceed with cycle 3 FOLFOXIRI/Avastin today as scheduled.  Restaging CTs after cycle 5.  We reviewed the CBC from today.  Counts adequate to proceed as above.  She will receive the Covid booster on 08/04/2020.  She will return for lab, follow-up, cycle 4 FOLFOXIRI/Avastin in 2 weeks.    Ned Card ANP/GNP-BC   08/02/2020  9:27 AM

## 2020-08-02 NOTE — Patient Instructions (Signed)
[facility] Discharge Instructions for Patients Receiving Chemotherapy  Today you received the following chemotherapy agents bevacizumab, irinotecan, leucovorin, oxaliplatin, and 47fu  To help prevent nausea and vomiting after your treatment, we encourage you to take your nausea medication as directed   If you develop nausea and vomiting that is not controlled by your nausea medication, call the clinic.   BELOW ARE SYMPTOMS THAT SHOULD BE REPORTED IMMEDIATELY:  *FEVER GREATER THAN 100.5 F  *CHILLS WITH OR WITHOUT FEVER  NAUSEA AND VOMITING THAT IS NOT CONTROLLED WITH YOUR NAUSEA MEDICATION  *UNUSUAL SHORTNESS OF BREATH  *UNUSUAL BRUISING OR BLEEDING  TENDERNESS IN MOUTH AND THROAT WITH OR WITHOUT PRESENCE OF ULCERS  *URINARY PROBLEMS  *BOWEL PROBLEMS  UNUSUAL RASH Items with * indicate a potential emergency and should be followed up as soon as possible.  Feel free to call the clinic should you have any questions or concerns. The clinic phone number is [phone].  Please show the [place] at check-in to the Emergency Department and triage nurse.

## 2020-08-03 ENCOUNTER — Telehealth: Payer: Self-pay | Admitting: Nurse Practitioner

## 2020-08-03 NOTE — Telephone Encounter (Signed)
Scheduled appointments per 9/14 los. Patient is aware of all upcoming appointments.

## 2020-08-04 ENCOUNTER — Inpatient Hospital Stay: Payer: Medicare Other

## 2020-08-04 ENCOUNTER — Ambulatory Visit: Payer: Medicare Other

## 2020-08-04 ENCOUNTER — Other Ambulatory Visit: Payer: Self-pay

## 2020-08-04 VITALS — BP 139/72 | HR 89 | Temp 98.2°F | Resp 18

## 2020-08-04 DIAGNOSIS — C185 Malignant neoplasm of splenic flexure: Secondary | ICD-10-CM

## 2020-08-04 DIAGNOSIS — Z23 Encounter for immunization: Secondary | ICD-10-CM

## 2020-08-04 MED ORDER — HEPARIN SOD (PORK) LOCK FLUSH 100 UNIT/ML IV SOLN
500.0000 [IU] | Freq: Once | INTRAVENOUS | Status: AC | PRN
  Administered 2020-08-04: 500 [IU]
  Filled 2020-08-04: qty 5

## 2020-08-04 MED ORDER — SODIUM CHLORIDE 0.9% FLUSH
10.0000 mL | INTRAVENOUS | Status: DC | PRN
  Administered 2020-08-04: 10 mL
  Filled 2020-08-04: qty 10

## 2020-08-04 NOTE — Progress Notes (Signed)
Patient was observed post Covid-19 immunization for 15 minutes . No reaction noted. Patient was instructed to call 911 with any severe reactions post vaccine:  Difficulty breathing   Swelling of face and throat   A fast heartbeat   A bad rash all over body   Dizziness and weakness   

## 2020-08-08 MED FILL — PRAVASTATIN SODIUM 20 MG TA: 20 | 90 days supply | Qty: 90 | Fill #0

## 2020-08-15 ENCOUNTER — Other Ambulatory Visit: Payer: Self-pay | Admitting: Oncology

## 2020-08-16 ENCOUNTER — Inpatient Hospital Stay: Payer: Medicare Other | Admitting: Oncology

## 2020-08-16 ENCOUNTER — Inpatient Hospital Stay: Payer: Medicare Other

## 2020-08-16 ENCOUNTER — Other Ambulatory Visit: Payer: Self-pay

## 2020-08-16 VITALS — BP 139/77 | HR 100 | Temp 98.4°F | Resp 18 | Ht 66.0 in | Wt 133.8 lb

## 2020-08-16 VITALS — HR 85

## 2020-08-16 DIAGNOSIS — C185 Malignant neoplasm of splenic flexure: Secondary | ICD-10-CM

## 2020-08-16 DIAGNOSIS — Z95828 Presence of other vascular implants and grafts: Secondary | ICD-10-CM

## 2020-08-16 LAB — CBC WITH DIFFERENTIAL (CANCER CENTER ONLY)
Abs Immature Granulocytes: 0.02 10*3/uL (ref 0.00–0.07)
Basophils Absolute: 0 10*3/uL (ref 0.0–0.1)
Basophils Relative: 1 %
Eosinophils Absolute: 0.2 10*3/uL (ref 0.0–0.5)
Eosinophils Relative: 6 %
HCT: 34.9 % — ABNORMAL LOW (ref 36.0–46.0)
Hemoglobin: 11.5 g/dL — ABNORMAL LOW (ref 12.0–15.0)
Immature Granulocytes: 1 %
Lymphocytes Relative: 31 %
Lymphs Abs: 1 10*3/uL (ref 0.7–4.0)
MCH: 31.3 pg (ref 26.0–34.0)
MCHC: 33 g/dL (ref 30.0–36.0)
MCV: 94.8 fL (ref 80.0–100.0)
Monocytes Absolute: 0.6 10*3/uL (ref 0.1–1.0)
Monocytes Relative: 16 %
Neutro Abs: 1.5 10*3/uL — ABNORMAL LOW (ref 1.7–7.7)
Neutrophils Relative %: 45 %
Platelet Count: 281 10*3/uL (ref 150–400)
RBC: 3.68 MIL/uL — ABNORMAL LOW (ref 3.87–5.11)
RDW: 15.9 % — ABNORMAL HIGH (ref 11.5–15.5)
WBC Count: 3.4 10*3/uL — ABNORMAL LOW (ref 4.0–10.5)
nRBC: 0 % (ref 0.0–0.2)

## 2020-08-16 LAB — CMP (CANCER CENTER ONLY)
ALT: 34 U/L (ref 0–44)
AST: 28 U/L (ref 15–41)
Albumin: 2.7 g/dL — ABNORMAL LOW (ref 3.5–5.0)
Alkaline Phosphatase: 116 U/L (ref 38–126)
Anion gap: 6 (ref 5–15)
BUN: 12 mg/dL (ref 8–23)
CO2: 26 mmol/L (ref 22–32)
Calcium: 8.9 mg/dL (ref 8.9–10.3)
Chloride: 106 mmol/L (ref 98–111)
Creatinine: 0.74 mg/dL (ref 0.44–1.00)
GFR, Est AFR Am: >60 mL/min (ref >60)
GFR, Estimated: >60 mL/min (ref >60)
Glucose, Bld: 114 mg/dL — ABNORMAL HIGH (ref 70–99)
Potassium: 3.9 mmol/L (ref 3.5–5.1)
Sodium: 138 mmol/L (ref 135–145)
Total Bilirubin: <0.2 mg/dL — ABNORMAL LOW (ref 0.3–1.2)
Total Protein: 6.4 g/dL — ABNORMAL LOW (ref 6.5–8.1)

## 2020-08-16 LAB — CEA (IN HOUSE-CHCC): CEA (CHCC-In House): 1.37 ng/mL (ref 0.00–5.00)

## 2020-08-16 LAB — TOTAL PROTEIN, URINE DIPSTICK: Protein, ur: NEGATIVE mg/dL

## 2020-08-16 MED ORDER — DEXTROSE 5 % IV SOLN
Freq: Once | INTRAVENOUS | Status: AC
  Filled 2020-08-16: qty 250

## 2020-08-16 MED ORDER — PALONOSETRON HCL INJECTION 0.25 MG/5ML
INTRAVENOUS | Status: AC
  Filled 2020-08-16: qty 5

## 2020-08-16 MED ORDER — SODIUM CHLORIDE 0.9 % IV SOLN
150.0000 mg | Freq: Once | INTRAVENOUS | Status: AC
  Administered 2020-08-16: 150 mg via INTRAVENOUS
  Filled 2020-08-16: qty 150

## 2020-08-16 MED ORDER — SODIUM CHLORIDE 0.9 % IV SOLN
2000.0000 mg/m2 | INTRAVENOUS | Status: DC
  Administered 2020-08-16: 3400 mg via INTRAVENOUS
  Filled 2020-08-16: qty 68

## 2020-08-16 MED ORDER — PALONOSETRON HCL INJECTION 0.25 MG/5ML
0.2500 mg | Freq: Once | INTRAVENOUS | Status: AC
  Administered 2020-08-16: 0.25 mg via INTRAVENOUS

## 2020-08-16 MED ORDER — OXALIPLATIN CHEMO INJECTION 100 MG/20ML
88.0000 mg/m2 | Freq: Once | INTRAVENOUS | Status: AC
  Administered 2020-08-16: 150 mg via INTRAVENOUS
  Filled 2020-08-16: qty 20

## 2020-08-16 MED ORDER — SODIUM CHLORIDE 0.9% FLUSH
10.0000 mL | Freq: Once | INTRAVENOUS | Status: AC
  Administered 2020-08-16: 10 mL
  Filled 2020-08-16: qty 10

## 2020-08-16 MED ORDER — SODIUM CHLORIDE 0.9% FLUSH
10.0000 mL | INTRAVENOUS | Status: DC | PRN
  Filled 2020-08-16: qty 10

## 2020-08-16 MED ORDER — HEPARIN SOD (PORK) LOCK FLUSH 100 UNIT/ML IV SOLN
500.0000 [IU] | Freq: Once | INTRAVENOUS | Status: DC | PRN
  Filled 2020-08-16: qty 5

## 2020-08-16 MED ORDER — SODIUM CHLORIDE 0.9 % IV SOLN
Freq: Once | INTRAVENOUS | Status: AC
  Filled 2020-08-16: qty 250

## 2020-08-16 MED ORDER — SODIUM CHLORIDE 0.9 % IV SOLN
150.0000 mg/m2 | Freq: Once | INTRAVENOUS | Status: AC
  Administered 2020-08-16: 260 mg via INTRAVENOUS
  Filled 2020-08-16: qty 13

## 2020-08-16 MED ORDER — SODIUM CHLORIDE 0.9 % IV SOLN
10.0000 mg | Freq: Once | INTRAVENOUS | Status: AC
  Administered 2020-08-16: 10 mg via INTRAVENOUS
  Filled 2020-08-16: qty 10

## 2020-08-16 MED ORDER — SODIUM CHLORIDE 0.9 % IV SOLN
5.0000 mg/kg | Freq: Once | INTRAVENOUS | Status: AC
  Administered 2020-08-16: 300 mg via INTRAVENOUS
  Filled 2020-08-16: qty 12

## 2020-08-16 MED ORDER — ATROPINE SULFATE 1 MG/ML IJ SOLN
INTRAMUSCULAR | Status: AC
  Filled 2020-08-16: qty 1

## 2020-08-16 MED ORDER — LEUCOVORIN CALCIUM INJECTION 350 MG
400.0000 mg/m2 | Freq: Once | INTRAVENOUS | Status: AC
  Administered 2020-08-16: 676 mg via INTRAVENOUS
  Filled 2020-08-16: qty 33.8

## 2020-08-16 MED ORDER — ATROPINE SULFATE 1 MG/ML IJ SOLN
0.5000 mg | Freq: Once | INTRAMUSCULAR | Status: AC | PRN
  Administered 2020-08-16: 0.5 mg via INTRAVENOUS

## 2020-08-16 NOTE — Progress Notes (Signed)
Per Dr. Stevphen Meuse to treat today with [place] 1.5. Will receive Udenyca on day 3. OK to start treatment before urine protein obtained, but need to collect prior to leaving facility today.

## 2020-08-16 NOTE — Patient Instructions (Signed)

## 2020-08-16 NOTE — Progress Notes (Signed)
  Kathy Robinson   Diagnosis: Colon cancer  INTERVAL HISTORY:   Kathy Robinson completed another cycle of FOLFOXIRI/Avastin on 08/02/2020.  No nausea/vomiting, mouth sores, or diarrhea.  She reports mild nosebleeding.  She has cold sensitivity following chemotherapy.  No neuropathy symptoms at present.  Objective:  Vital signs in last 24 hours:  Blood pressure 139/77, pulse (!) 110, temperature 98.4 F (36.9 C), temperature source Tympanic, resp. rate 18, height $RemoveBe'5\' 6"'uTQUKOjiy$  (1.676 m), weight 133 lb 12.8 oz (60.7 kg), last menstrual period 11/19/1996, SpO2 100 %.    HEENT: No thrush or ulcers Resp: Lungs clear bilaterally Cardio: Regular rate and rhythm GI: No hepatosplenomegaly, nontender Vascular: No leg edema  Skin: Palms without erythema  Portacath/PICC-without erythema  Lab Results:  Lab Results  Component Value Date   WBC 3.4 (L) 08/16/2020   HGB 11.5 (L) 08/16/2020   HCT 34.9 (L) 08/16/2020   MCV 94.8 08/16/2020   PLT 281 08/16/2020   NEUTROABS 1.5 (L) 08/16/2020    CMP  Lab Results  Component Value Date   NA 138 08/16/2020   K 3.9 08/16/2020   CL 106 08/16/2020   CO2 26 08/16/2020   GLUCOSE 114 (H) 08/16/2020   BUN 12 08/16/2020   CREATININE 0.74 08/16/2020   CALCIUM 8.9 08/16/2020   PROT 6.4 (L) 08/16/2020   ALBUMIN 2.7 (L) 08/16/2020   AST 28 08/16/2020   ALT 34 08/16/2020   ALKPHOS 116 08/16/2020   BILITOT <0.2 (L) 08/16/2020   GFRNONAA >60 08/16/2020   GFRAA >60 08/16/2020    Lab Results  Component Value Date   CEA1 16.28 (H) 06/29/2020     Medications: I have reviewed the patient's current medications.   Assessment/Plan: 1. Adenocarcinoma the left colon, stage IIIc (pT4b,pN2a), status post a left colectomy 05/30/2020, MSS, TMB 13, BRAF V600E ? Tumor invades the visceral peritoneum, lymphovascular and perineural invasion present, for tumor deposits, 7/13 lymph nodes, negative resection margins, no loss of  mismatch repair protein expression ? CT abdomen/pelvis 05/26/2020-long segment of masslike thickening of the descending colon with associated high-grade colonic obstruction, small colonic wall defect with evidence of a focally contained microperforation, retroperitoneal adenopathy, indeterminate small hypodense liver lesions ? Elevated preoperative CEA ? PET8/02/2020-hypermetabolic liver metastases, periportal and periaortic adenopathy, hypermetabolic mediastinal and left supraclavicular nodes ? Cycle 1 FOLFOX 07/05/20  ? Cycle 2 FOLFOXIRI (dose-reduced 5FU, no bolus) and Avastin 07/19/20  ? Cycle 3 FOLFOXIRI (Dose reduced 5-FU, no bolus)/Avastin 08/02/2020 ? Cycle 4 FOLFOXIRI/Avastin 08/16/2020 (Udenyca added) 2. Atrial fibrillation with rapid ventricular response 05/26/2020 3. Mild neutropenia following cycle 3 FOLFOXIRI, Udenyca added with cycle 4     Disposition: Kathy Robinson has completed 3 cycles of chemotherapy.  She continues to tolerate the chemotherapy well.  She has mild neutropenia today.  The plan is to proceed with cycle 4 FOLFOXIRI/Avastin today.  G-CSF support will be added.  We reviewed potential toxicities associated with G-CSF including the chance of bone pain, rash, and splenic rupture.  She agrees to proceed.  Kathy Robinson will call for a fever or symptoms of infection.  She will return for an office visit in the next cycle of chemotherapy in 2 weeks.  She will be scheduled for restaging CTs after cycle 5.  [name], MD  08/16/2020  10:47 AM

## 2020-08-16 NOTE — Patient Instructions (Signed)
[facility] Discharge Instructions for Patients Receiving Chemotherapy  Today you received the following chemotherapy agents bevacizumab, irinotecan, leucovorin, oxaliplatin, and 61fu  To help prevent nausea and vomiting after your treatment, we encourage you to take your nausea medication as directed   If you develop nausea and vomiting that is not controlled by your nausea medication, call the clinic.   BELOW ARE SYMPTOMS THAT SHOULD BE REPORTED IMMEDIATELY:  *FEVER GREATER THAN 100.5 F  *CHILLS WITH OR WITHOUT FEVER  NAUSEA AND VOMITING THAT IS NOT CONTROLLED WITH YOUR NAUSEA MEDICATION  *UNUSUAL SHORTNESS OF BREATH  *UNUSUAL BRUISING OR BLEEDING  TENDERNESS IN MOUTH AND THROAT WITH OR WITHOUT PRESENCE OF ULCERS  *URINARY PROBLEMS  *BOWEL PROBLEMS  UNUSUAL RASH Items with * indicate a potential emergency and should be followed up as soon as possible.  Feel free to call the clinic should you have any questions or concerns. The clinic phone number is [phone].  Please show the [facility] at check-in to the Emergency Department and triage nurse.

## 2020-08-17 ENCOUNTER — Telehealth: Payer: Self-pay | Admitting: Oncology

## 2020-08-17 NOTE — Telephone Encounter (Signed)
Scheduled appointments per 9/28 los. Spoke to patient who is aware of upcoming appointments.

## 2020-08-18 ENCOUNTER — Other Ambulatory Visit: Payer: Self-pay

## 2020-08-18 ENCOUNTER — Inpatient Hospital Stay: Payer: Medicare Other

## 2020-08-18 VITALS — BP 130/79 | HR 92 | Temp 98.7°F | Resp 18

## 2020-08-18 DIAGNOSIS — C185 Malignant neoplasm of splenic flexure: Secondary | ICD-10-CM

## 2020-08-18 MED ORDER — HEPARIN SOD (PORK) LOCK FLUSH 100 UNIT/ML IV SOLN
500.0000 [IU] | Freq: Once | INTRAVENOUS | Status: AC | PRN
  Administered 2020-08-18: 500 [IU]
  Filled 2020-08-18: qty 5

## 2020-08-18 MED ORDER — PEGFILGRASTIM-CBQV 6 MG/0.6ML ~~LOC~~ SOSY
6.0000 mg | PREFILLED_SYRINGE | Freq: Once | SUBCUTANEOUS | Status: AC
  Administered 2020-08-18: 6 mg via SUBCUTANEOUS

## 2020-08-18 MED ORDER — PEGFILGRASTIM-CBQV 6 MG/0.6ML ~~LOC~~ SOSY
PREFILLED_SYRINGE | SUBCUTANEOUS | Status: AC
  Filled 2020-08-18: qty 0.6

## 2020-08-18 MED ORDER — SODIUM CHLORIDE 0.9% FLUSH
10.0000 mL | INTRAVENOUS | Status: DC | PRN
  Administered 2020-08-18: 10 mL
  Filled 2020-08-18: qty 10

## 2020-08-18 NOTE — Patient Instructions (Signed)
Implanted Port Home Guide An implanted port is a device that is placed under the skin. It is usually placed in the chest. The device can be used to give IV medicine, to take blood, or for dialysis. You may have an implanted port if:  You need IV medicine that would be irritating to the small veins in your hands or arms.  You need IV medicines, such as antibiotics, for a long period of time.  You need IV nutrition for a long period of time.  You need dialysis. Having a port means that your health care provider will not need to use the veins in your arms for these procedures. You may have fewer limitations when using a port than you would if you used other types of long-term IVs, and you will likely be able to return to normal activities after your incision heals. An implanted port has two main parts:  Reservoir. The reservoir is the part where a needle is inserted to give medicines or draw blood. The reservoir is round. After it is placed, it appears as a small, raised area under your skin.  Catheter. The catheter is a thin, flexible tube that connects the reservoir to a vein. Medicine that is inserted into the reservoir goes into the catheter and then into the vein. How is my port accessed? To access your port:  A numbing cream may be placed on the skin over the port site.  Your health care provider will put on a mask and sterile gloves.  The skin over your port will be cleaned carefully with a germ-killing soap and allowed to dry.  Your health care provider will gently pinch the port and insert a needle into it.  Your health care provider will check for a blood return to make sure the port is in the vein and is not clogged.  If your port needs to remain accessed to get medicine continuously (constant infusion), your health care provider will place a clear bandage (dressing) over the needle site. The dressing and needle will need to be changed every week, or as told by your health care  provider. What is flushing? Flushing helps keep the port from getting clogged. Follow instructions from your health care provider about how and when to flush the port. Ports are usually flushed with saline solution or a medicine called heparin. The need for flushing will depend on how the port is used:  If the port is only used from time to time to give medicines or draw blood, the port may need to be flushed: ? Before and after medicines have been given. ? Before and after blood has been drawn. ? As part of routine maintenance. Flushing may be recommended every 4-6 weeks.  If a constant infusion is running, the port may not need to be flushed.  Throw away any syringes in a disposal container that is meant for sharp items (sharps container). You can buy a sharps container from a pharmacy, or you can make one by using an empty hard plastic bottle with a cover. How long will my port stay implanted? The port can stay in for as long as your health care provider thinks it is needed. When it is time for the port to come out, a surgery will be done to remove it. The surgery will be similar to the procedure that was done to put the port in. Follow these instructions at home:   Flush your port as told by your health care provider.    If you need an infusion over several days, follow instructions from your health care provider about how to take care of your port site. Make sure you: ? Wash your hands with soap and water before you change your dressing. If soap and water are not available, use alcohol-based hand sanitizer. ? Change your dressing as told by your health care provider. ? Place any used dressings or infusion bags into a plastic bag. Throw that bag in the trash. ? Keep the dressing that covers the needle clean and dry. Do not get it wet. ? Do not use scissors or sharp objects near the tube. ? Keep the tube clamped, unless it is being used.  Check your port site every day for signs of  infection. Check for: ? Redness, swelling, or pain. ? Fluid or blood. ? Pus or a bad smell.  Protect the skin around the port site. ? Avoid wearing bra straps that rub or irritate the site. ? Protect the skin around your port from seat belts. Place a soft pad over your chest if needed.  Bathe or shower as told by your health care provider. The site may get wet as long as you are not actively receiving an infusion.  Return to your normal activities as told by your health care provider. Ask your health care provider what activities are safe for you.  Carry a medical alert card or wear a medical alert bracelet at all times. This will let health care providers know that you have an implanted port in case of an emergency. Get help right away if:  You have redness, swelling, or pain at the port site.  You have fluid or blood coming from your port site.  You have pus or a bad smell coming from the port site.  You have a fever. Summary  Implanted ports are usually placed in the chest for long-term IV access.  Follow instructions from your health care provider about flushing the port and changing bandages (dressings).  Take care of the area around your port by avoiding clothing that puts pressure on the area, and by watching for signs of infection.  Protect the skin around your port from seat belts. Place a soft pad over your chest if needed.  Get help right away if you have a fever or you have redness, swelling, pain, drainage, or a bad smell at the port site. This information is not intended to replace advice given to you by your health care provider. Make sure you discuss any questions you have with your health care provider. Document Revised: 02/27/2019 Document Reviewed: 12/08/2016 Elsevier Patient Education  2020 Elsevier Inc. Pegfilgrastim injection What is this medicine? PEGFILGRASTIM (PEG fil gra stim) is a long-acting granulocyte colony-stimulating factor that stimulates the  growth of neutrophils, a type of white blood cell important in the body's fight against infection. It is used to reduce the incidence of fever and infection in patients with certain types of cancer who are receiving chemotherapy that affects the bone marrow, and to increase survival after being exposed to high doses of radiation. This medicine may be used for other purposes; ask your health care provider or pharmacist if you have questions. COMMON BRAND NAME(S): Fulphila, Neulasta, UDENYCA, Ziextenzo What should I tell my health care provider before I take this medicine? They need to know if you have any of these conditions:  kidney disease  latex allergy  ongoing radiation therapy  sickle cell disease  skin reactions to acrylic adhesives (On-Body   Injector only)  an unusual or allergic reaction to pegfilgrastim, filgrastim, other medicines, foods, dyes, or preservatives  pregnant or trying to get pregnant  breast-feeding How should I use this medicine? This medicine is for injection under the skin. If you get this medicine at home, you will be taught how to prepare and give the pre-filled syringe or how to use the On-body Injector. Refer to the patient Instructions for Use for detailed instructions. Use exactly as directed. Tell your healthcare provider immediately if you suspect that the On-body Injector may not have performed as intended or if you suspect the use of the On-body Injector resulted in a missed or partial dose. It is important that you put your used needles and syringes in a special sharps container. Do not put them in a trash can. If you do not have a sharps container, call your pharmacist or healthcare provider to get one. Talk to your pediatrician regarding the use of this medicine in children. While this drug may be prescribed for selected conditions, precautions do apply. Overdosage: If you think you have taken too much of this medicine contact a poison control center or  emergency room at once. NOTE: This medicine is only for you. Do not share this medicine with others. What if I miss a dose? It is important not to miss your dose. Call your doctor or health care professional if you miss your dose. If you miss a dose due to an On-body Injector failure or leakage, a new dose should be administered as soon as possible using a single prefilled syringe for manual use. What may interact with this medicine? Interactions have not been studied. Give your health care provider a list of all the medicines, herbs, non-prescription drugs, or dietary supplements you use. Also tell them if you smoke, drink alcohol, or use illegal drugs. Some items may interact with your medicine. This list may not describe all possible interactions. Give your health care provider a list of all the medicines, herbs, non-prescription drugs, or dietary supplements you use. Also tell them if you smoke, drink alcohol, or use illegal drugs. Some items may interact with your medicine. What should I watch for while using this medicine? You may need blood work done while you are taking this medicine. If you are going to need a MRI, CT scan, or other procedure, tell your doctor that you are using this medicine (On-Body Injector only). What side effects may I notice from receiving this medicine? Side effects that you should report to your doctor or health care professional as soon as possible:  allergic reactions like skin rash, itching or hives, swelling of the face, lips, or tongue  back pain  dizziness  fever  pain, redness, or irritation at site where injected  pinpoint red spots on the skin  red or dark-brown urine  shortness of breath or breathing problems  stomach or side pain, or pain at the shoulder  swelling  tiredness  trouble passing urine or change in the amount of urine Side effects that usually do not require medical attention (report to your doctor or health care  professional if they continue or are bothersome):  bone pain  muscle pain This list may not describe all possible side effects. Call your doctor for medical advice about side effects. You may report side effects to FDA at 1-800-FDA-1088. Where should I keep my medicine? Keep out of the reach of children. If you are using this medicine at home, you will be instructed   on how to store it. Throw away any unused medicine after the expiration date on the label. NOTE: This sheet is a summary. It may not cover all possible information. If you have questions about this medicine, talk to your doctor, pharmacist, or health care provider.  2020 Elsevier/Gold Standard (2018-02-10 16:57:08)  

## 2020-08-23 ENCOUNTER — Telehealth: Payer: Self-pay | Admitting: *Deleted

## 2020-08-23 NOTE — Telephone Encounter (Signed)
Called to report diarrhea beginning 08/19/20 of ~ 4 loose stools/day. Taking Imodium 4/day. Instructed her to increase the Imodium to #2 tablets qid (up to 8/day) beginning today. Push fluids and call tomorrow if not significantly improved.

## 2020-08-24 ENCOUNTER — Telehealth: Payer: Self-pay

## 2020-08-24 NOTE — Telephone Encounter (Signed)
Nursing returned call to pt/pt called stated she had diarrhea  08/23/2020 called in spoke with nurse received new recommendations to take imodium according to box directions pt called in this morning states she has NOT had loose stools since approx 0900.

## 2020-08-28 ENCOUNTER — Other Ambulatory Visit: Payer: Self-pay | Admitting: Oncology

## 2020-08-30 ENCOUNTER — Other Ambulatory Visit: Payer: Self-pay | Admitting: Nurse Practitioner

## 2020-08-30 ENCOUNTER — Other Ambulatory Visit: Payer: Self-pay

## 2020-08-30 ENCOUNTER — Inpatient Hospital Stay (HOSPITAL_BASED_OUTPATIENT_CLINIC_OR_DEPARTMENT_OTHER): Payer: Medicare Other | Admitting: Nurse Practitioner

## 2020-08-30 ENCOUNTER — Inpatient Hospital Stay: Payer: Medicare Other | Attending: Oncology

## 2020-08-30 ENCOUNTER — Inpatient Hospital Stay: Payer: Medicare Other

## 2020-08-30 ENCOUNTER — Encounter: Payer: Self-pay | Admitting: Nurse Practitioner

## 2020-08-30 VITALS — BP 144/77 | HR 96 | Temp 98.4°F | Resp 16 | Ht 66.0 in | Wt 132.7 lb

## 2020-08-30 DIAGNOSIS — I4891 Unspecified atrial fibrillation: Secondary | ICD-10-CM | POA: Diagnosis not present

## 2020-08-30 DIAGNOSIS — E876 Hypokalemia: Secondary | ICD-10-CM | POA: Diagnosis not present

## 2020-08-30 DIAGNOSIS — C185 Malignant neoplasm of splenic flexure: Secondary | ICD-10-CM

## 2020-08-30 DIAGNOSIS — Z5111 Encounter for antineoplastic chemotherapy: Secondary | ICD-10-CM | POA: Insufficient documentation

## 2020-08-30 DIAGNOSIS — T451X5A Adverse effect of antineoplastic and immunosuppressive drugs, initial encounter: Secondary | ICD-10-CM | POA: Insufficient documentation

## 2020-08-30 DIAGNOSIS — Z23 Encounter for immunization: Secondary | ICD-10-CM | POA: Insufficient documentation

## 2020-08-30 DIAGNOSIS — D709 Neutropenia, unspecified: Secondary | ICD-10-CM | POA: Diagnosis not present

## 2020-08-30 DIAGNOSIS — G620 Drug-induced polyneuropathy: Secondary | ICD-10-CM | POA: Insufficient documentation

## 2020-08-30 DIAGNOSIS — R197 Diarrhea, unspecified: Secondary | ICD-10-CM | POA: Insufficient documentation

## 2020-08-30 DIAGNOSIS — Z5189 Encounter for other specified aftercare: Secondary | ICD-10-CM | POA: Insufficient documentation

## 2020-08-30 LAB — CMP (CANCER CENTER ONLY)
ALT: 25 U/L (ref 0–44)
AST: 26 U/L (ref 15–41)
Albumin: 2.8 g/dL — ABNORMAL LOW (ref 3.5–5.0)
Alkaline Phosphatase: 125 U/L (ref 38–126)
Anion gap: 5 (ref 5–15)
BUN: 12 mg/dL (ref 8–23)
CO2: 26 mmol/L (ref 22–32)
Calcium: 9 mg/dL (ref 8.9–10.3)
Chloride: 108 mmol/L (ref 98–111)
Creatinine: 0.76 mg/dL (ref 0.44–1.00)
GFR, Estimated: >60 mL/min (ref >60)
Glucose, Bld: 113 mg/dL — ABNORMAL HIGH (ref 70–99)
Potassium: 4.1 mmol/L (ref 3.5–5.1)
Sodium: 139 mmol/L (ref 135–145)
Total Bilirubin: <0.2 mg/dL — ABNORMAL LOW (ref 0.3–1.2)
Total Protein: 6.4 g/dL — ABNORMAL LOW (ref 6.5–8.1)

## 2020-08-30 LAB — CBC WITH DIFFERENTIAL (CANCER CENTER ONLY)
Abs Immature Granulocytes: 1.34 10*3/uL — ABNORMAL HIGH (ref 0.00–0.07)
Basophils Absolute: 0.1 10*3/uL (ref 0.0–0.1)
Basophils Relative: 0 %
Eosinophils Absolute: 0.1 10*3/uL (ref 0.0–0.5)
Eosinophils Relative: 1 %
HCT: 35.8 % — ABNORMAL LOW (ref 36.0–46.0)
Hemoglobin: 11.7 g/dL — ABNORMAL LOW (ref 12.0–15.0)
Immature Granulocytes: 10 %
Lymphocytes Relative: 18 %
Lymphs Abs: 2.3 10*3/uL (ref 0.7–4.0)
MCH: 31.7 pg (ref 26.0–34.0)
MCHC: 32.7 g/dL (ref 30.0–36.0)
MCV: 97 fL (ref 80.0–100.0)
Monocytes Absolute: 1 10*3/uL (ref 0.1–1.0)
Monocytes Relative: 8 %
Neutro Abs: 8.1 10*3/uL — ABNORMAL HIGH (ref 1.7–7.7)
Neutrophils Relative %: 63 %
Platelet Count: 236 10*3/uL (ref 150–400)
RBC: 3.69 MIL/uL — ABNORMAL LOW (ref 3.87–5.11)
RDW: 17.3 % — ABNORMAL HIGH (ref 11.5–15.5)
WBC Count: 12.8 10*3/uL — ABNORMAL HIGH (ref 4.0–10.5)
nRBC: 0.7 % — ABNORMAL HIGH (ref 0.0–0.2)

## 2020-08-30 LAB — CEA (IN HOUSE-CHCC): CEA (CHCC-In House): <1 ng/mL (ref 0.00–5.00)

## 2020-08-30 MED ORDER — ATROPINE SULFATE 1 MG/ML IJ SOLN
INTRAMUSCULAR | Status: AC
  Filled 2020-08-30: qty 1

## 2020-08-30 MED ORDER — SODIUM CHLORIDE 0.9 % IV SOLN
Freq: Once | INTRAVENOUS | Status: AC
  Filled 2020-08-30: qty 250

## 2020-08-30 MED ORDER — OXALIPLATIN CHEMO INJECTION 100 MG/20ML
88.0000 mg/m2 | Freq: Once | INTRAVENOUS | Status: AC
  Administered 2020-08-30: 150 mg via INTRAVENOUS
  Filled 2020-08-30: qty 20

## 2020-08-30 MED ORDER — PALONOSETRON HCL INJECTION 0.25 MG/5ML
0.2500 mg | Freq: Once | INTRAVENOUS | Status: AC
  Administered 2020-08-30: 0.25 mg via INTRAVENOUS

## 2020-08-30 MED ORDER — INFLUENZA VAC A&B SA ADJ QUAD 0.5 ML IM PRSY
PREFILLED_SYRINGE | INTRAMUSCULAR | Status: AC
  Filled 2020-08-30: qty 0.5

## 2020-08-30 MED ORDER — SODIUM CHLORIDE 0.9 % IV SOLN
150.0000 mg/m2 | Freq: Once | INTRAVENOUS | Status: AC
  Administered 2020-08-30: 260 mg via INTRAVENOUS
  Filled 2020-08-30: qty 13

## 2020-08-30 MED ORDER — LEUCOVORIN CALCIUM INJECTION 350 MG
400.0000 mg/m2 | Freq: Once | INTRAVENOUS | Status: AC
  Administered 2020-08-30: 676 mg via INTRAVENOUS
  Filled 2020-08-30: qty 33.8

## 2020-08-30 MED ORDER — SODIUM CHLORIDE 0.9 % IV SOLN
5.0000 mg/kg | Freq: Once | INTRAVENOUS | Status: AC
  Administered 2020-08-30: 300 mg via INTRAVENOUS
  Filled 2020-08-30: qty 12

## 2020-08-30 MED ORDER — ATROPINE SULFATE 1 MG/ML IJ SOLN
0.5000 mg | Freq: Once | INTRAMUSCULAR | Status: AC | PRN
  Administered 2020-08-30: 0.5 mg via INTRAVENOUS

## 2020-08-30 MED ORDER — SODIUM CHLORIDE 0.9 % IV SOLN
2000.0000 mg/m2 | INTRAVENOUS | Status: DC
  Administered 2020-08-30: 3400 mg via INTRAVENOUS
  Filled 2020-08-30: qty 68

## 2020-08-30 MED ORDER — PALONOSETRON HCL INJECTION 0.25 MG/5ML
INTRAVENOUS | Status: AC
  Filled 2020-08-30: qty 5

## 2020-08-30 MED ORDER — SODIUM CHLORIDE 0.9 % IV SOLN
150.0000 mg | Freq: Once | INTRAVENOUS | Status: AC
  Administered 2020-08-30: 150 mg via INTRAVENOUS
  Filled 2020-08-30: qty 150

## 2020-08-30 MED ORDER — DEXTROSE 5 % IV SOLN
Freq: Once | INTRAVENOUS | Status: AC
  Filled 2020-08-30: qty 250

## 2020-08-30 MED ORDER — DIPHENOXYLATE-ATROPINE 2.5-0.025 MG PO TABS: 1.0000 | ORAL_TABLET | Freq: Four times a day (QID) | ORAL | 0 refills | Status: DC | PRN

## 2020-08-30 MED ORDER — SODIUM CHLORIDE 0.9 % IV SOLN
10.0000 mg | Freq: Once | INTRAVENOUS | Status: AC
  Administered 2020-08-30: 10 mg via INTRAVENOUS
  Filled 2020-08-30: qty 10

## 2020-08-30 MED ORDER — INFLUENZA VAC A&B SA ADJ QUAD 0.5 ML IM PRSY
0.5000 mL | PREFILLED_SYRINGE | Freq: Once | INTRAMUSCULAR | Status: AC
  Administered 2020-08-30: 0.5 mL via INTRAMUSCULAR

## 2020-08-30 MED FILL — DIPHENOXYLATE-ATROPINE 2.5-: 2.5-0.025 | 4 days supply | Qty: 30 | Fill #0

## 2020-08-30 NOTE — Patient Instructions (Signed)
Implanted Port Home Guide An implanted port is a device that is placed under the skin. It is usually placed in the chest. The device can be used to give IV medicine, to take blood, or for dialysis. You may have an implanted port if:  You need IV medicine that would be irritating to the small veins in your hands or arms.  You need IV medicines, such as antibiotics, for a long period of time.  You need IV nutrition for a long period of time.  You need dialysis. Having a port means that your health care provider will not need to use the veins in your arms for these procedures. You may have fewer limitations when using a port than you would if you used other types of long-term IVs, and you will likely be able to return to normal activities after your incision heals. An implanted port has two main parts:  Reservoir. The reservoir is the part where a needle is inserted to give medicines or draw blood. The reservoir is round. After it is placed, it appears as a small, raised area under your skin.  Catheter. The catheter is a thin, flexible tube that connects the reservoir to a vein. Medicine that is inserted into the reservoir goes into the catheter and then into the vein. How is my port accessed? To access your port:  A numbing cream may be placed on the skin over the port site.  Your health care provider will put on a mask and sterile gloves.  The skin over your port will be cleaned carefully with a germ-killing soap and allowed to dry.  Your health care provider will gently pinch the port and insert a needle into it.  Your health care provider will check for a blood return to make sure the port is in the vein and is not clogged.  If your port needs to remain accessed to get medicine continuously (constant infusion), your health care provider will place a clear bandage (dressing) over the needle site. The dressing and needle will need to be changed every week, or as told by your health care  provider. What is flushing? Flushing helps keep the port from getting clogged. Follow instructions from your health care provider about how and when to flush the port. Ports are usually flushed with saline solution or a medicine called heparin. The need for flushing will depend on how the port is used:  If the port is only used from time to time to give medicines or draw blood, the port may need to be flushed: ? Before and after medicines have been given. ? Before and after blood has been drawn. ? As part of routine maintenance. Flushing may be recommended every 4-6 weeks.  If a constant infusion is running, the port may not need to be flushed.  Throw away any syringes in a disposal container that is meant for sharp items (sharps container). You can buy a sharps container from a pharmacy, or you can make one by using an empty hard plastic bottle with a cover. How long will my port stay implanted? The port can stay in for as long as your health care provider thinks it is needed. When it is time for the port to come out, a surgery will be done to remove it. The surgery will be similar to the procedure that was done to put the port in. Follow these instructions at home:   Flush your port as told by your health care provider.    If you need an infusion over several days, follow instructions from your health care provider about how to take care of your port site. Make sure you: ? Wash your hands with soap and water before you change your dressing. If soap and water are not available, use alcohol-based hand sanitizer. ? Change your dressing as told by your health care provider. ? Place any used dressings or infusion bags into a plastic bag. Throw that bag in the trash. ? Keep the dressing that covers the needle clean and dry. Do not get it wet. ? Do not use scissors or sharp objects near the tube. ? Keep the tube clamped, unless it is being used.  Check your port site every day for signs of  infection. Check for: ? Redness, swelling, or pain. ? Fluid or blood. ? Pus or a bad smell.  Protect the skin around the port site. ? Avoid wearing bra straps that rub or irritate the site. ? Protect the skin around your port from seat belts. Place a soft pad over your chest if needed.  Bathe or shower as told by your health care provider. The site may get wet as long as you are not actively receiving an infusion.  Return to your normal activities as told by your health care provider. Ask your health care provider what activities are safe for you.  Carry a medical alert card or wear a medical alert bracelet at all times. This will let health care providers know that you have an implanted port in case of an emergency. Get help right away if:  You have redness, swelling, or pain at the port site.  You have fluid or blood coming from your port site.  You have pus or a bad smell coming from the port site.  You have a fever. Summary  Implanted ports are usually placed in the chest for long-term IV access.  Follow instructions from your health care provider about flushing the port and changing bandages (dressings).  Take care of the area around your port by avoiding clothing that puts pressure on the area, and by watching for signs of infection.  Protect the skin around your port from seat belts. Place a soft pad over your chest if needed.  Get help right away if you have a fever or you have redness, swelling, pain, drainage, or a bad smell at the port site. This information is not intended to replace advice given to you by your health care provider. Make sure you discuss any questions you have with your health care provider. Document Revised: 02/27/2019 Document Reviewed: 12/08/2016 Elsevier Patient Education  2020 Elsevier Inc. Pegfilgrastim injection What is this medicine? PEGFILGRASTIM (PEG fil gra stim) is a long-acting granulocyte colony-stimulating factor that stimulates the  growth of neutrophils, a type of white blood cell important in the body's fight against infection. It is used to reduce the incidence of fever and infection in patients with certain types of cancer who are receiving chemotherapy that affects the bone marrow, and to increase survival after being exposed to high doses of radiation. This medicine may be used for other purposes; ask your health care provider or pharmacist if you have questions. COMMON BRAND NAME(S): Fulphila, Neulasta, UDENYCA, Ziextenzo What should I tell my health care provider before I take this medicine? They need to know if you have any of these conditions:  kidney disease  latex allergy  ongoing radiation therapy  sickle cell disease  skin reactions to acrylic adhesives (On-Body   Injector only)  an unusual or allergic reaction to pegfilgrastim, filgrastim, other medicines, foods, dyes, or preservatives  pregnant or trying to get pregnant  breast-feeding How should I use this medicine? This medicine is for injection under the skin. If you get this medicine at home, you will be taught how to prepare and give the pre-filled syringe or how to use the On-body Injector. Refer to the patient Instructions for Use for detailed instructions. Use exactly as directed. Tell your healthcare provider immediately if you suspect that the On-body Injector may not have performed as intended or if you suspect the use of the On-body Injector resulted in a missed or partial dose. It is important that you put your used needles and syringes in a special sharps container. Do not put them in a trash can. If you do not have a sharps container, call your pharmacist or healthcare provider to get one. Talk to your pediatrician regarding the use of this medicine in children. While this drug may be prescribed for selected conditions, precautions do apply. Overdosage: If you think you have taken too much of this medicine contact a poison control center or  emergency room at once. NOTE: This medicine is only for you. Do not share this medicine with others. What if I miss a dose? It is important not to miss your dose. Call your doctor or health care professional if you miss your dose. If you miss a dose due to an On-body Injector failure or leakage, a new dose should be administered as soon as possible using a single prefilled syringe for manual use. What may interact with this medicine? Interactions have not been studied. Give your health care provider a list of all the medicines, herbs, non-prescription drugs, or dietary supplements you use. Also tell them if you smoke, drink alcohol, or use illegal drugs. Some items may interact with your medicine. This list may not describe all possible interactions. Give your health care provider a list of all the medicines, herbs, non-prescription drugs, or dietary supplements you use. Also tell them if you smoke, drink alcohol, or use illegal drugs. Some items may interact with your medicine. What should I watch for while using this medicine? You may need blood work done while you are taking this medicine. If you are going to need a MRI, CT scan, or other procedure, tell your doctor that you are using this medicine (On-Body Injector only). What side effects may I notice from receiving this medicine? Side effects that you should report to your doctor or health care professional as soon as possible:  allergic reactions like skin rash, itching or hives, swelling of the face, lips, or tongue  back pain  dizziness  fever  pain, redness, or irritation at site where injected  pinpoint red spots on the skin  red or dark-brown urine  shortness of breath or breathing problems  stomach or side pain, or pain at the shoulder  swelling  tiredness  trouble passing urine or change in the amount of urine Side effects that usually do not require medical attention (report to your doctor or health care  professional if they continue or are bothersome):  bone pain  muscle pain This list may not describe all possible side effects. Call your doctor for medical advice about side effects. You may report side effects to FDA at 1-800-FDA-1088. Where should I keep my medicine? Keep out of the reach of children. If you are using this medicine at home, you will be instructed   on how to store it. Throw away any unused medicine after the expiration date on the label. NOTE: This sheet is a summary. It may not cover all possible information. If you have questions about this medicine, talk to your doctor, pharmacist, or health care provider.  2020 Elsevier/Gold Standard (2018-02-10 16:57:08)  

## 2020-08-30 NOTE — Patient Instructions (Signed)
[facility] Discharge Instructions for Patients Receiving Chemotherapy  Today you received the following monoclonal antibody and chemotherapy agents Bevacizumab-bvzr (ZIRABEV), Irinotecan (CAMPTOSAR), Oxaliplatin (CAMPTOSAR), Leucovorin & Flourouracil (ADRUCIL).  To help prevent nausea and vomiting after your treatment, we encourage you to take your nausea medication as prescribed.   If you develop nausea and vomiting that is not controlled by your nausea medication, call the clinic.   BELOW ARE SYMPTOMS THAT SHOULD BE REPORTED IMMEDIATELY:  *FEVER GREATER THAN 100.5 F  *CHILLS WITH OR WITHOUT FEVER  NAUSEA AND VOMITING THAT IS NOT CONTROLLED WITH YOUR NAUSEA MEDICATION  *UNUSUAL SHORTNESS OF BREATH  *UNUSUAL BRUISING OR BLEEDING  TENDERNESS IN MOUTH AND THROAT WITH OR WITHOUT PRESENCE OF ULCERS  *URINARY PROBLEMS  *BOWEL PROBLEMS  UNUSUAL RASH Items with * indicate a potential emergency and should be followed up as soon as possible.  Feel free to call the clinic should you have any questions or concerns. The clinic phone number is [phone].  Please show the [place] [facility] at check-in to the Emergency Department and triage nurse.

## 2020-08-30 NOTE — Progress Notes (Signed)
  [facility] OFFICE PROGRESS NOTE   Diagnosis:  Colon cancer  INTERVAL HISTORY:   Kathy Robinson returns as scheduled.  She completed cycle 4 of FOLFOXIRI/Avastin 08/16/2020.  She denies nausea/vomiting.  No mouth sores.  She developed diarrhea day 1.  The diarrhea lasted 5 days.  She took Imodium with partial relief.  Cold sensitivity lasted about 3 days.  No persistent neuropathy symptoms.  She noted a small amount of blood on the toilet tissue when she was having diarrhea.  No other bleeding.  No leg swelling or calf pain.  No shortness of breath.  Objective:  Vital signs in last 24 hours:  Blood pressure (!) 144/77, pulse 96, temperature 98.4 F (36.9 C), temperature source Tympanic, resp. rate 16, height $RemoveBe'5\' 6"'ZcZJFQCes$  (1.676 m), weight 132 lb 11.2 oz (60.2 kg), last menstrual period 11/19/1996, SpO2 98 %.    HEENT: No thrush or ulcers. Resp: Lungs clear bilaterally. Cardio: Regular rate and rhythm. GI: Abdomen soft and nontender.  No hepatomegaly. Vascular: No leg edema. Neuro: Mild decrease in vibratory sense over the fingertips per tuning fork exam. Skin: Palms without erythema. Port-A-Cath without erythema.   Lab Results:  Lab Results  Component Value Date   WBC 12.8 (H) 08/30/2020   HGB 11.7 (L) 08/30/2020   HCT 35.8 (L) 08/30/2020   MCV 97.0 08/30/2020   PLT 236 08/30/2020   NEUTROABS 8.1 (H) 08/30/2020    Imaging:  No results found.  Medications: I have reviewed the patient's current medications.  Assessment/Plan: 1. Adenocarcinoma the left colon, stage IIIc (pT4b,pN2a), status post a left colectomy 05/30/2020, MSS, TMB 13, BRAF V600E ? Tumor invades the visceral peritoneum, lymphovascular and perineural invasion present, for tumor deposits, 7/13 lymph nodes, negative resection margins, no loss of mismatch repair protein expression ? CT abdomen/pelvis 05/26/2020-long segment of masslike thickening of the descending colon with associated high-grade colonic  obstruction, small colonic wall defect with evidence of a focally contained microperforation, retroperitoneal adenopathy, indeterminate small hypodense liver lesions ? Elevated preoperative CEA ? PET8/02/2020-hypermetabolic liver metastases, periportal and periaortic adenopathy, hypermetabolic mediastinal and left supraclavicular nodes ? Cycle 1 FOLFOX 07/05/20  ? Cycle 2 FOLFOXIRI (dose-reduced 5FU, no bolus) and Avastin 07/19/20 ? Cycle 3 FOLFOXIRI (Dose reduced 5-FU, no bolus)/Avastin 08/02/2020 ? Cycle 4 FOLFOXIRI/Avastin 08/16/2020 (Udenyca added) ? Cycle 5 FOLFOXIRI/Avastin 08/30/2020 2. Atrial fibrillation with rapid ventricular response 05/26/2020 3. Mild neutropenia following cycle 3 FOLFOXIRI, Udenyca added with cycle 4  Disposition: Kathy Robinson appears stable.  She has completed 4 cycles of FOLFOXIRI plus Avastin.  Plan to proceed with cycle 5 today as scheduled.  She will undergo restaging CTs prior to her next visit.  We reviewed the CBC from today.  Counts adequate to proceed with treatment as above.  I am sending a prescription for Lomotil to her pharmacy.  She will contact the office with poorly controlled diarrhea.  She will return for lab, follow-up, treatment in 2 weeks with restaging CTs a few days prior.  She will contact the office in the interim as outlined above or with any other problems.  Plan reviewed with Dr. Benay Spice.    Ned Card ANP/GNP-BC   08/30/2020  10:45 AM

## 2020-08-31 ENCOUNTER — Telehealth: Payer: Self-pay | Admitting: Oncology

## 2020-08-31 NOTE — Telephone Encounter (Signed)
Scheduled appointments per 10/12 los. Will have updated calendar printed out at patient's next appointment.

## 2020-09-01 ENCOUNTER — Inpatient Hospital Stay: Payer: Medicare Other

## 2020-09-01 ENCOUNTER — Other Ambulatory Visit: Payer: Self-pay

## 2020-09-01 DIAGNOSIS — C185 Malignant neoplasm of splenic flexure: Secondary | ICD-10-CM | POA: Diagnosis not present

## 2020-09-01 MED ORDER — SODIUM CHLORIDE 0.9% FLUSH
10.0000 mL | INTRAVENOUS | Status: DC | PRN
  Administered 2020-09-01: 10 mL
  Filled 2020-09-01: qty 10

## 2020-09-01 MED ORDER — PEGFILGRASTIM-CBQV 6 MG/0.6ML ~~LOC~~ SOSY
6.0000 mg | PREFILLED_SYRINGE | Freq: Once | SUBCUTANEOUS | Status: AC
  Administered 2020-09-01: 6 mg via SUBCUTANEOUS

## 2020-09-01 MED ORDER — HEPARIN SOD (PORK) LOCK FLUSH 100 UNIT/ML IV SOLN
500.0000 [IU] | Freq: Once | INTRAVENOUS | Status: AC | PRN
  Administered 2020-09-01: 500 [IU]
  Filled 2020-09-01: qty 5

## 2020-09-01 NOTE — Patient Instructions (Signed)
Implanted Port Home Guide An implanted port is a device that is placed under the skin. It is usually placed in the chest. The device can be used to give IV medicine, to take blood, or for dialysis. You may have an implanted port if:  You need IV medicine that would be irritating to the small veins in your hands or arms.  You need IV medicines, such as antibiotics, for a long period of time.  You need IV nutrition for a long period of time.  You need dialysis. Having a port means that your health care provider will not need to use the veins in your arms for these procedures. You may have fewer limitations when using a port than you would if you used other types of long-term IVs, and you will likely be able to return to normal activities after your incision heals. An implanted port has two main parts:  Reservoir. The reservoir is the part where a needle is inserted to give medicines or draw blood. The reservoir is round. After it is placed, it appears as a small, raised area under your skin.  Catheter. The catheter is a thin, flexible tube that connects the reservoir to a vein. Medicine that is inserted into the reservoir goes into the catheter and then into the vein. How is my port accessed? To access your port:  A numbing cream may be placed on the skin over the port site.  Your health care provider will put on a mask and sterile gloves.  The skin over your port will be cleaned carefully with a germ-killing soap and allowed to dry.  Your health care provider will gently pinch the port and insert a needle into it.  Your health care provider will check for a blood return to make sure the port is in the vein and is not clogged.  If your port needs to remain accessed to get medicine continuously (constant infusion), your health care provider will place a clear bandage (dressing) over the needle site. The dressing and needle will need to be changed every week, or as told by your health care  provider. What is flushing? Flushing helps keep the port from getting clogged. Follow instructions from your health care provider about how and when to flush the port. Ports are usually flushed with saline solution or a medicine called heparin. The need for flushing will depend on how the port is used:  If the port is only used from time to time to give medicines or draw blood, the port may need to be flushed: ? Before and after medicines have been given. ? Before and after blood has been drawn. ? As part of routine maintenance. Flushing may be recommended every 4-6 weeks.  If a constant infusion is running, the port may not need to be flushed.  Throw away any syringes in a disposal container that is meant for sharp items (sharps container). You can buy a sharps container from a pharmacy, or you can make one by using an empty hard plastic bottle with a cover. How long will my port stay implanted? The port can stay in for as long as your health care provider thinks it is needed. When it is time for the port to come out, a surgery will be done to remove it. The surgery will be similar to the procedure that was done to put the port in. Follow these instructions at home:   Flush your port as told by your health care provider.    If you need an infusion over several days, follow instructions from your health care provider about how to take care of your port site. Make sure you: ? Wash your hands with soap and water before you change your dressing. If soap and water are not available, use alcohol-based hand sanitizer. ? Change your dressing as told by your health care provider. ? Place any used dressings or infusion bags into a plastic bag. Throw that bag in the trash. ? Keep the dressing that covers the needle clean and dry. Do not get it wet. ? Do not use scissors or sharp objects near the tube. ? Keep the tube clamped, unless it is being used.  Check your port site every day for signs of  infection. Check for: ? Redness, swelling, or pain. ? Fluid or blood. ? Pus or a bad smell.  Protect the skin around the port site. ? Avoid wearing bra straps that rub or irritate the site. ? Protect the skin around your port from seat belts. Place a soft pad over your chest if needed.  Bathe or shower as told by your health care provider. The site may get wet as long as you are not actively receiving an infusion.  Return to your normal activities as told by your health care provider. Ask your health care provider what activities are safe for you.  Carry a medical alert card or wear a medical alert bracelet at all times. This will let health care providers know that you have an implanted port in case of an emergency. Get help right away if:  You have redness, swelling, or pain at the port site.  You have fluid or blood coming from your port site.  You have pus or a bad smell coming from the port site.  You have a fever. Summary  Implanted ports are usually placed in the chest for long-term IV access.  Follow instructions from your health care provider about flushing the port and changing bandages (dressings).  Take care of the area around your port by avoiding clothing that puts pressure on the area, and by watching for signs of infection.  Protect the skin around your port from seat belts. Place a soft pad over your chest if needed.  Get help right away if you have a fever or you have redness, swelling, pain, drainage, or a bad smell at the port site. This information is not intended to replace advice given to you by your health care provider. Make sure you discuss any questions you have with your health care provider. Document Revised: 02/27/2019 Document Reviewed: 12/08/2016 Elsevier Patient Education  2020 Elsevier Inc. Pegfilgrastim injection What is this medicine? PEGFILGRASTIM (PEG fil gra stim) is a long-acting granulocyte colony-stimulating factor that stimulates the  growth of neutrophils, a type of white blood cell important in the body's fight against infection. It is used to reduce the incidence of fever and infection in patients with certain types of cancer who are receiving chemotherapy that affects the bone marrow, and to increase survival after being exposed to high doses of radiation. This medicine may be used for other purposes; ask your health care provider or pharmacist if you have questions. COMMON BRAND NAME(S): Fulphila, Neulasta, UDENYCA, Ziextenzo What should I tell my health care provider before I take this medicine? They need to know if you have any of these conditions:  kidney disease  latex allergy  ongoing radiation therapy  sickle cell disease  skin reactions to acrylic adhesives (On-Body   Injector only)  an unusual or allergic reaction to pegfilgrastim, filgrastim, other medicines, foods, dyes, or preservatives  pregnant or trying to get pregnant  breast-feeding How should I use this medicine? This medicine is for injection under the skin. If you get this medicine at home, you will be taught how to prepare and give the pre-filled syringe or how to use the On-body Injector. Refer to the patient Instructions for Use for detailed instructions. Use exactly as directed. Tell your healthcare provider immediately if you suspect that the On-body Injector may not have performed as intended or if you suspect the use of the On-body Injector resulted in a missed or partial dose. It is important that you put your used needles and syringes in a special sharps container. Do not put them in a trash can. If you do not have a sharps container, call your pharmacist or healthcare provider to get one. Talk to your pediatrician regarding the use of this medicine in children. While this drug may be prescribed for selected conditions, precautions do apply. Overdosage: If you think you have taken too much of this medicine contact a poison control center or  emergency room at once. NOTE: This medicine is only for you. Do not share this medicine with others. What if I miss a dose? It is important not to miss your dose. Call your doctor or health care professional if you miss your dose. If you miss a dose due to an On-body Injector failure or leakage, a new dose should be administered as soon as possible using a single prefilled syringe for manual use. What may interact with this medicine? Interactions have not been studied. Give your health care provider a list of all the medicines, herbs, non-prescription drugs, or dietary supplements you use. Also tell them if you smoke, drink alcohol, or use illegal drugs. Some items may interact with your medicine. This list may not describe all possible interactions. Give your health care provider a list of all the medicines, herbs, non-prescription drugs, or dietary supplements you use. Also tell them if you smoke, drink alcohol, or use illegal drugs. Some items may interact with your medicine. What should I watch for while using this medicine? You may need blood work done while you are taking this medicine. If you are going to need a MRI, CT scan, or other procedure, tell your doctor that you are using this medicine (On-Body Injector only). What side effects may I notice from receiving this medicine? Side effects that you should report to your doctor or health care professional as soon as possible:  allergic reactions like skin rash, itching or hives, swelling of the face, lips, or tongue  back pain  dizziness  fever  pain, redness, or irritation at site where injected  pinpoint red spots on the skin  red or dark-brown urine  shortness of breath or breathing problems  stomach or side pain, or pain at the shoulder  swelling  tiredness  trouble passing urine or change in the amount of urine Side effects that usually do not require medical attention (report to your doctor or health care  professional if they continue or are bothersome):  bone pain  muscle pain This list may not describe all possible side effects. Call your doctor for medical advice about side effects. You may report side effects to FDA at 1-800-FDA-1088. Where should I keep my medicine? Keep out of the reach of children. If you are using this medicine at home, you will be instructed   on how to store it. Throw away any unused medicine after the expiration date on the label. NOTE: This sheet is a summary. It may not cover all possible information. If you have questions about this medicine, talk to your doctor, pharmacist, or health care provider.  2020 Elsevier/Gold Standard (2018-02-10 16:57:08)  

## 2020-09-09 ENCOUNTER — Other Ambulatory Visit: Payer: Self-pay

## 2020-09-09 ENCOUNTER — Ambulatory Visit (HOSPITAL_COMMUNITY)
Admission: RE | Admit: 2020-09-09 | Discharge: 2020-09-09 | Disposition: A | Discharge status: Home / Self Care | Payer: Medicare Other | Source: Ambulatory Visit | Attending: Nurse Practitioner | Admitting: Nurse Practitioner

## 2020-09-09 DIAGNOSIS — C185 Malignant neoplasm of splenic flexure: Secondary | ICD-10-CM | POA: Diagnosis not present

## 2020-09-09 MED ORDER — IOHEXOL 300 MG/ML  SOLN
100.0000 mL | Freq: Once | INTRAMUSCULAR | Status: AC | PRN
  Administered 2020-09-09: 100 mL via INTRAVENOUS

## 2020-09-11 ENCOUNTER — Other Ambulatory Visit: Payer: Self-pay | Admitting: Oncology

## 2020-09-13 ENCOUNTER — Other Ambulatory Visit: Payer: Self-pay | Admitting: *Deleted

## 2020-09-13 ENCOUNTER — Inpatient Hospital Stay: Payer: Medicare Other

## 2020-09-13 ENCOUNTER — Other Ambulatory Visit: Payer: Self-pay

## 2020-09-13 ENCOUNTER — Inpatient Hospital Stay: Payer: Medicare Other | Admitting: Oncology

## 2020-09-13 VITALS — HR 92

## 2020-09-13 VITALS — BP 147/89 | HR 107 | Temp 98.3°F | Resp 16 | Ht 66.0 in | Wt 130.3 lb

## 2020-09-13 DIAGNOSIS — C185 Malignant neoplasm of splenic flexure: Secondary | ICD-10-CM

## 2020-09-13 DIAGNOSIS — Z95828 Presence of other vascular implants and grafts: Secondary | ICD-10-CM

## 2020-09-13 LAB — CBC WITH DIFFERENTIAL (CANCER CENTER ONLY)
Abs Immature Granulocytes: 0.65 10*3/uL — ABNORMAL HIGH (ref 0.00–0.07)
Basophils Absolute: 0.1 10*3/uL (ref 0.0–0.1)
Basophils Relative: 1 %
Eosinophils Absolute: 0.2 10*3/uL (ref 0.0–0.5)
Eosinophils Relative: 2 %
HCT: 33.8 % — ABNORMAL LOW (ref 36.0–46.0)
Hemoglobin: 11.3 g/dL — ABNORMAL LOW (ref 12.0–15.0)
Immature Granulocytes: 5 %
Lymphocytes Relative: 13 %
Lymphs Abs: 1.8 10*3/uL (ref 0.7–4.0)
MCH: 32.2 pg (ref 26.0–34.0)
MCHC: 33.4 g/dL (ref 30.0–36.0)
MCV: 96.3 fL (ref 80.0–100.0)
Monocytes Absolute: 1 10*3/uL (ref 0.1–1.0)
Monocytes Relative: 7 %
Neutro Abs: 10.5 10*3/uL — ABNORMAL HIGH (ref 1.7–7.7)
Neutrophils Relative %: 72 %
Platelet Count: 244 10*3/uL (ref 150–400)
RBC: 3.51 MIL/uL — ABNORMAL LOW (ref 3.87–5.11)
RDW: 18.3 % — ABNORMAL HIGH (ref 11.5–15.5)
WBC Count: 14.3 10*3/uL — ABNORMAL HIGH (ref 4.0–10.5)
nRBC: 0.4 % — ABNORMAL HIGH (ref 0.0–0.2)

## 2020-09-13 LAB — CMP (CANCER CENTER ONLY)
ALT: 24 U/L (ref 0–44)
AST: 22 U/L (ref 15–41)
Albumin: 2.9 g/dL — ABNORMAL LOW (ref 3.5–5.0)
Alkaline Phosphatase: 104 U/L (ref 38–126)
Anion gap: 6 (ref 5–15)
BUN: 12 mg/dL (ref 8–23)
CO2: 27 mmol/L (ref 22–32)
Calcium: 8.5 mg/dL — ABNORMAL LOW (ref 8.9–10.3)
Chloride: 107 mmol/L (ref 98–111)
Creatinine: 0.72 mg/dL (ref 0.44–1.00)
GFR, Estimated: >60 mL/min (ref >60)
Glucose, Bld: 109 mg/dL — ABNORMAL HIGH (ref 70–99)
Potassium: 3.1 mmol/L — ABNORMAL LOW (ref 3.5–5.1)
Sodium: 140 mmol/L (ref 135–145)
Total Bilirubin: <0.2 mg/dL — ABNORMAL LOW (ref 0.3–1.2)
Total Protein: 6 g/dL — ABNORMAL LOW (ref 6.5–8.1)

## 2020-09-13 LAB — TOTAL PROTEIN, URINE DIPSTICK: Protein, ur: 30 mg/dL — AB

## 2020-09-13 LAB — CEA (IN HOUSE-CHCC): CEA (CHCC-In House): <1 ng/mL (ref 0.00–5.00)

## 2020-09-13 MED ORDER — SODIUM CHLORIDE 0.9 % IV SOLN
150.0000 mg/m2 | Freq: Once | INTRAVENOUS | Status: AC
  Administered 2020-09-13: 260 mg via INTRAVENOUS
  Filled 2020-09-13: qty 13

## 2020-09-13 MED ORDER — SODIUM CHLORIDE 0.9 % IV SOLN
150.0000 mg | Freq: Once | INTRAVENOUS | Status: AC
  Administered 2020-09-13: 150 mg via INTRAVENOUS
  Filled 2020-09-13: qty 150

## 2020-09-13 MED ORDER — PALONOSETRON HCL INJECTION 0.25 MG/5ML
0.2500 mg | Freq: Once | INTRAVENOUS | Status: AC
  Administered 2020-09-13: 0.25 mg via INTRAVENOUS

## 2020-09-13 MED ORDER — PROCHLORPERAZINE MALEATE 10 MG PO TABS
10.0000 mg | ORAL_TABLET | Freq: Once | ORAL | Status: AC
  Administered 2020-09-13: 10 mg via ORAL

## 2020-09-13 MED ORDER — LEUCOVORIN CALCIUM INJECTION 350 MG
400.0000 mg/m2 | Freq: Once | INTRAVENOUS | Status: AC
  Administered 2020-09-13: 676 mg via INTRAVENOUS
  Filled 2020-09-13: qty 33.8

## 2020-09-13 MED ORDER — SODIUM CHLORIDE 0.9 % IV SOLN
10.0000 mg | Freq: Once | INTRAVENOUS | Status: AC
  Administered 2020-09-13: 10 mg via INTRAVENOUS
  Filled 2020-09-13: qty 10

## 2020-09-13 MED ORDER — DEXTROSE 5 % IV SOLN
Freq: Once | INTRAVENOUS | Status: AC
  Filled 2020-09-13: qty 250

## 2020-09-13 MED ORDER — ATROPINE SULFATE 1 MG/ML IJ SOLN
0.5000 mg | Freq: Once | INTRAMUSCULAR | Status: AC | PRN
  Administered 2020-09-13: 0.5 mg via INTRAVENOUS

## 2020-09-13 MED ORDER — SODIUM CHLORIDE 0.9 % IV SOLN
5.0000 mg/kg | Freq: Once | INTRAVENOUS | Status: AC
  Administered 2020-09-13: 300 mg via INTRAVENOUS
  Filled 2020-09-13: qty 12

## 2020-09-13 MED ORDER — OXALIPLATIN CHEMO INJECTION 100 MG/20ML: 85.0000 mg/m2 | Freq: Once | INTRAVENOUS | Status: DC

## 2020-09-13 MED ORDER — POTASSIUM CHLORIDE 10 MEQ/100ML IV SOLN: 10.0000 meq | INTRAVENOUS | Status: DC

## 2020-09-13 MED ORDER — SODIUM CHLORIDE 0.9 % IV SOLN
Freq: Once | INTRAVENOUS | Status: AC
  Filled 2020-09-13: qty 250

## 2020-09-13 MED ORDER — OXALIPLATIN CHEMO INJECTION 100 MG/20ML
87.5000 mg/m2 | Freq: Once | INTRAVENOUS | Status: AC
  Administered 2020-09-13: 150 mg via INTRAVENOUS
  Filled 2020-09-13: qty 20

## 2020-09-13 MED ORDER — ATROPINE SULFATE 1 MG/ML IJ SOLN
INTRAMUSCULAR | Status: AC
  Filled 2020-09-13: qty 1

## 2020-09-13 MED ORDER — POTASSIUM CHLORIDE 2 MEQ/ML IV SOLN
Freq: Once | INTRAVENOUS | Status: AC
  Filled 2020-09-13: qty 250

## 2020-09-13 MED ORDER — SODIUM CHLORIDE 0.9% FLUSH
10.0000 mL | Freq: Once | INTRAVENOUS | Status: AC
  Administered 2020-09-13: 10 mL
  Filled 2020-09-13: qty 10

## 2020-09-13 MED ORDER — POTASSIUM CHLORIDE 2 MEQ/ML IV SOLN: Freq: Once | INTRAVENOUS | Status: DC

## 2020-09-13 MED ORDER — PALONOSETRON HCL INJECTION 0.25 MG/5ML
INTRAVENOUS | Status: AC
  Filled 2020-09-13: qty 5

## 2020-09-13 MED ORDER — POTASSIUM CHLORIDE ER 10 MEQ PO CPCR: 10.0000 meq | ORAL_CAPSULE | Freq: Two times a day (BID) | ORAL | 1 refills | Status: DC

## 2020-09-13 MED ORDER — SODIUM CHLORIDE 0.9 % IV SOLN
2000.0000 mg/m2 | INTRAVENOUS | Status: DC
  Administered 2020-09-13: 3400 mg via INTRAVENOUS
  Filled 2020-09-13: qty 68

## 2020-09-13 MED ORDER — PROCHLORPERAZINE MALEATE 10 MG PO TABS
ORAL_TABLET | ORAL | Status: AC
  Filled 2020-09-13: qty 1

## 2020-09-13 MED FILL — POTASSIUM CL ER 10 MEQ CAP: 10 | 30 days supply | Qty: 60 | Fill #0

## 2020-09-13 NOTE — Progress Notes (Signed)
[facility] OFFICE PROGRESS NOTE    Diagnosis: Colon cancer  INTERVAL HISTORY:   Kathy Robinson completed another cycle of FOLFOXIRI/Avastin on 08/30/2020.  She had intermittent diarrhea following chemotherapy.  Diarrhea improved with Lomotil.  No mouth sores, nausea, or neuropathy symptoms.  No symptom of bleeding or thrombosis.  Objective:  Vital signs in last 24 hours:  Blood pressure (!) 147/89, pulse (!) 107, temperature 98.3 F (36.8 C), temperature source Tympanic, resp. rate 16, height _0  (1.676 m), weight 130 lb 4.8 oz (59.1 kg), last menstrual period 11/19/1996, SpO2 100 %.    HEENT: No thrush or ulcers Resp: Lungs clear bilaterally Cardio: Regular rate and rhythm GI: No hepatosplenomegaly, no mass, nontender Vascular: No leg edema Neuro: Mild loss of vibratory sense at the fingertips bilaterally    Portacath/PICC-without erythema  Lab Results:  Lab Results  Component Value Date   WBC 14.3 (H) 09/13/2020   HGB 11.3 (L) 09/13/2020   HCT 33.8 (L) 09/13/2020   MCV 96.3 09/13/2020   PLT 244 09/13/2020   NEUTROABS PENDING 09/13/2020    CMP  Lab Results  Component Value Date   NA 140 09/13/2020   K 3.1 (L) 09/13/2020   CL 107 09/13/2020   CO2 27 09/13/2020   GLUCOSE 109 (H) 09/13/2020   BUN 12 09/13/2020   CREATININE 0.72 09/13/2020   CALCIUM 8.5 (L) 09/13/2020   PROT 6.0 (L) 09/13/2020   ALBUMIN 2.9 (L) 09/13/2020   AST 22 09/13/2020   ALT 24 09/13/2020   ALKPHOS 104 09/13/2020   BILITOT <0.2 (L) 09/13/2020   GFRNONAA >60 09/13/2020   GFRAA >60 08/16/2020    Lab Results  Component Value Date   CEA1 <1.00 08/30/2020    Lab Results  Component Value Date   INR 1.1 05/30/2020    Imaging:  CT Chest W Contrast  Result Date: 09/10/2020 CLINICAL DATA:  Colorectal cancer, assess treatment response and restage metastatic colorectal cancer EXAM: CT CHEST, ABDOMEN, AND PELVIS WITH CONTRAST TECHNIQUE: Multidetector CT imaging of  the chest, abdomen and pelvis was performed following the standard protocol during bolus administration of intravenous contrast. CONTRAST:  12m OMNIPAQUE IOHEXOL 300 MG/ML  SOLN COMPARISON:  July 2021 FINDINGS: CT CHEST FINDINGS Cardiovascular: Heart size is normal without pericardial effusion. Central pulmonary vasculature is normal caliber. Aortic caliber is normal. Calcified and noncalcified atheromatous plaque in the thoracic aorta. RIGHT-sided Port-A-Cath terminates at the caval to atrial junction. Mediastinum/Nodes: No mediastinal, hilar, axillary or supraclavicular lymphadenopathy by size criteria. Mildly enlarged LEFT supraclavicular lymph node (image 7 of series 2) at 6 mm has diminished in size compared to the prior PET scan where it measured approximately 8-9 mm. Precarinal lymph node previously approximately 1 cm now at 7 mm. High RIGHT paratracheal lymph nodes also similar dim managed in size. Pre-vascular lymph nodes with similar decrease in size, the largest prevascular lymph node currently is approximately 4 mm. Scattered small axillary lymph nodes none with pathologic enlargement also diminished in size. Lungs/Pleura: No acute pulmonary process. Minimal scarring along RIGHT heart border in the RIGHT middle lobe and in the lingula. Airways are patent. No suspicious pulmonary nodule. Musculoskeletal: No chest wall mass. The area of increased FDG uptake along the posterior LEFT chest and in the intercostal musculature seen on the PET-CT shows no current imaging correlate see below for full musculoskeletal details. CT ABDOMEN PELVIS FINDINGS Hepatobiliary: Hepatic metastatic lesions are decreased in size compared to the prior PET scan not well assessed due  to lack of contrast but larger than on the study of May 26, 2020. (Image 55, series 2) 11 mm anterior medial segment LEFT hepatic lobe previously 15 mm. Disease at the upper margin of the RIGHT hemi liver that measured potentially as large as 3.8 cm  on the prior study now with 2 areas of low attenuation in this location anterior to the IVC and between middle and RIGHT hepatic veins in hepatic subsegment VIII (image 46, series 2) 2 areas measuring 9 mm. In the dome of the RIGHT hemi liver (image 48, series 2) 6 mm lesion previously 20 mm. Small lesion in the posterior RIGHT hepatic lobe may be slightly smaller as well on image 55 of series 2 measuring 6 as compared to 11 mm. LEFT lateral segment lesions are very vague and also implied diminished size. In a 6 mm focus of low attenuation is noted in the LEFT lateral segment. The dominant lesion lateral segment LEFT hepatic lobe is essentially not visible on current CT and may have resolved. No pericholecystic stranding or biliary duct dilation. Pancreas: Pancreas normal without ductal dilation or sign of inflammation. Spleen: Spleen normal in size and contour. Adrenals/Urinary Tract: Adrenal glands are normal. Symmetric renal enhancement. No hydronephrosis. Urinary bladder is normal. Stomach/Bowel: No acute gastrointestinal process following LEFT hemicolectomy. Stranding in the LEFT retroperitoneum with an ovoid area of predominantly fatty density measuring approximately 3.1 x 3.0 x 6.2 cm. Anastomotic site in the pelvis without surrounding stranding. Appendix slightly more distended on the prior study but without signs of inflammation, stool like material in the lumen. No wall thickening. Vascular/Lymphatic: Calcified and noncalcified atheromatous plaque in the abdominal aorta without aneurysmal dilation. Subtle stranding at the site of previous cluster of lymph nodes and at the porta hepatis in the upper abdomen along celiac stations. Ill-defined small lymph nodes track along the LEFT periaortic chain largest 5 mm previously lymph nodes and groups of nodes measuring up to 14 mm in this location. No pelvic nodal enlargement also with resolution of LEFT common iliac lymph nodes that were seen on the prior  examination. No pelvic sidewall lymphadenopathy. Reproductive: Uterus and adnexa unremarkable by CT. Other: No sign of ascites.  No peritoneal nodularity. Musculoskeletal: No destructive bone finding. No acute bone process. Spinal degenerative changes. IMPRESSION: 1. Response to therapy with diminished size of lymph nodes throughout the chest, abdomen and pelvis as well as hepatic metastatic disease. 2. No new signs of disease in the chest, abdomen or pelvis. 3. Fat density area with surrounding stranding presumably fat necrosis following LEFT hemicolectomy. Attention on follow-up is suggested given the presence of surrounding soft tissue extending in the retroperitoneum on the prior study. This is diminished when compared to the prior PET scan. 4. Stranding in the LEFT retroperitoneum with an ovoid area of predominantly fatty density measuring approximately 3.1 x 3.0 x 6.2 cm. This is likely related to prior surgery. Attention on follow-up. 5. Appendix now top-normal in size but without signs of surrounding inflammation, approximately 6-7 mm as compared to 4-5 mm caliber. Likely further distended with stool rather than related to acute process. Correlate with any symptoms of abdominal pain. 6. Aortic atherosclerosis. These results will be called to the ordering clinician or representative by the Radiologist Assistant, and communication documented in the PACS or [employer]. Aortic Atherosclerosis (ICD10-I70.0). Electronically Signed   By: Zetta Bills M.D.   On: 09/10/2020 12:34   CT Abdomen Pelvis W Contrast  Result Date: 09/10/2020 CLINICAL DATA:  Colorectal  cancer, assess treatment response and restage metastatic colorectal cancer EXAM: CT CHEST, ABDOMEN, AND PELVIS WITH CONTRAST TECHNIQUE: Multidetector CT imaging of the chest, abdomen and pelvis was performed following the standard protocol during bolus administration of intravenous contrast. CONTRAST:  147m OMNIPAQUE IOHEXOL 300 MG/ML  SOLN  COMPARISON:  July 2021 FINDINGS: CT CHEST FINDINGS Cardiovascular: Heart size is normal without pericardial effusion. Central pulmonary vasculature is normal caliber. Aortic caliber is normal. Calcified and noncalcified atheromatous plaque in the thoracic aorta. RIGHT-sided Port-A-Cath terminates at the caval to atrial junction. Mediastinum/Nodes: No mediastinal, hilar, axillary or supraclavicular lymphadenopathy by size criteria. Mildly enlarged LEFT supraclavicular lymph node (image 7 of series 2) at 6 mm has diminished in size compared to the prior PET scan where it measured approximately 8-9 mm. Precarinal lymph node previously approximately 1 cm now at 7 mm. High RIGHT paratracheal lymph nodes also similar dim managed in size. Pre-vascular lymph nodes with similar decrease in size, the largest prevascular lymph node currently is approximately 4 mm. Scattered small axillary lymph nodes none with pathologic enlargement also diminished in size. Lungs/Pleura: No acute pulmonary process. Minimal scarring along RIGHT heart border in the RIGHT middle lobe and in the lingula. Airways are patent. No suspicious pulmonary nodule. Musculoskeletal: No chest wall mass. The area of increased FDG uptake along the posterior LEFT chest and in the intercostal musculature seen on the PET-CT shows no current imaging correlate see below for full musculoskeletal details. CT ABDOMEN PELVIS FINDINGS Hepatobiliary: Hepatic metastatic lesions are decreased in size compared to the prior PET scan not well assessed due to lack of contrast but larger than on the study of May 26, 2020. (Image 55, series 2) 11 mm anterior medial segment LEFT hepatic lobe previously 15 mm. Disease at the upper margin of the RIGHT hemi liver that measured potentially as large as 3.8 cm on the prior study now with 2 areas of low attenuation in this location anterior to the IVC and between middle and RIGHT hepatic veins in hepatic subsegment VIII (image 46, series  2) 2 areas measuring 9 mm. In the dome of the RIGHT hemi liver (image 48, series 2) 6 mm lesion previously 20 mm. Small lesion in the posterior RIGHT hepatic lobe may be slightly smaller as well on image 55 of series 2 measuring 6 as compared to 11 mm. LEFT lateral segment lesions are very vague and also implied diminished size. In a 6 mm focus of low attenuation is noted in the LEFT lateral segment. The dominant lesion lateral segment LEFT hepatic lobe is essentially not visible on current CT and may have resolved. No pericholecystic stranding or biliary duct dilation. Pancreas: Pancreas normal without ductal dilation or sign of inflammation. Spleen: Spleen normal in size and contour. Adrenals/Urinary Tract: Adrenal glands are normal. Symmetric renal enhancement. No hydronephrosis. Urinary bladder is normal. Stomach/Bowel: No acute gastrointestinal process following LEFT hemicolectomy. Stranding in the LEFT retroperitoneum with an ovoid area of predominantly fatty density measuring approximately 3.1 x 3.0 x 6.2 cm. Anastomotic site in the pelvis without surrounding stranding. Appendix slightly more distended on the prior study but without signs of inflammation, stool like material in the lumen. No wall thickening. Vascular/Lymphatic: Calcified and noncalcified atheromatous plaque in the abdominal aorta without aneurysmal dilation. Subtle stranding at the site of previous cluster of lymph nodes and at the porta hepatis in the upper abdomen along celiac stations. Ill-defined small lymph nodes track along the LEFT periaortic chain largest 5 mm previously lymph nodes and  groups of nodes measuring up to 14 mm in this location. No pelvic nodal enlargement also with resolution of LEFT common iliac lymph nodes that were seen on the prior examination. No pelvic sidewall lymphadenopathy. Reproductive: Uterus and adnexa unremarkable by CT. Other: No sign of ascites.  No peritoneal nodularity. Musculoskeletal: No destructive  bone finding. No acute bone process. Spinal degenerative changes. IMPRESSION: 1. Response to therapy with diminished size of lymph nodes throughout the chest, abdomen and pelvis as well as hepatic metastatic disease. 2. No new signs of disease in the chest, abdomen or pelvis. 3. Fat density area with surrounding stranding presumably fat necrosis following LEFT hemicolectomy. Attention on follow-up is suggested given the presence of surrounding soft tissue extending in the retroperitoneum on the prior study. This is diminished when compared to the prior PET scan. 4. Stranding in the LEFT retroperitoneum with an ovoid area of predominantly fatty density measuring approximately 3.1 x 3.0 x 6.2 cm. This is likely related to prior surgery. Attention on follow-up. 5. Appendix now top-normal in size but without signs of surrounding inflammation, approximately 6-7 mm as compared to 4-5 mm caliber. Likely further distended with stool rather than related to acute process. Correlate with any symptoms of abdominal pain. 6. Aortic atherosclerosis. These results will be called to the ordering clinician or representative by the Radiologist Assistant, and communication documented in the PACS or [employer]. Aortic Atherosclerosis (ICD10-I70.0). Electronically Signed   By: Zetta Bills M.D.   On: 09/10/2020 12:34    Medications: I have reviewed the patient's current medications.   Assessment/Plan: 1. Adenocarcinoma the left colon, stage IIIc (pT4b,pN2a), status post a left colectomy 05/30/2020, MSS, TMB 13, BRAF V600E ? Tumor invades the visceral peritoneum, lymphovascular and perineural invasion present, for tumor deposits, 7/13 lymph nodes, negative resection margins, no loss of mismatch repair protein expression ? CT abdomen/pelvis 05/26/2020-long segment of masslike thickening of the descending colon with associated high-grade colonic obstruction, small colonic wall defect with evidence of a focally contained  microperforation, retroperitoneal adenopathy, indeterminate small hypodense liver lesions ? Elevated preoperative CEA ? PET8/02/2020-hypermetabolic liver metastases, periportal and periaortic adenopathy, hypermetabolic mediastinal and left supraclavicular nodes ? Cycle 1 FOLFOX 07/05/20  ? Cycle 2 FOLFOXIRI (dose-reduced 5FU, no bolus) and Avastin 07/19/20 ? Cycle 3 FOLFOXIRI (Dose reduced 5-FU, no bolus)/Avastin 08/02/2020 ? Cycle 4 FOLFOXIRI/Avastin 08/16/2020 (Udenyca added) ? Cycle 5 FOLFOXIRI/Avastin 08/30/2020 ? CTs 09/09/2020-diminished size of lymph nodes in the chest, abdomen, and pelvis.  Decreased hepatic metastases.  No new evidence of disease progression, fat density lesion surrounding the left hemicolectomy ? Cycle 6 FOLFOXIRI/Avastin 09/13/2020 2. Atrial fibrillation with rapid ventricular response 05/26/2020 3. Mild neutropenia following cycle 3 FOLFOXIRI, Udenyca added with cycle 4 4. Hypokalemia secondary to diarrhea-potassium supplementation starting 09/13/2020 5. 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  The restaging CTs are consistent with a response to therapy.  The CEA is lower.  Kathy Robinson has tolerated the chemotherapy well.  She will complete cycle 6 chemotherapy today.  I reviewed the restaging CT images.  Kathy Robinson will begin a potassium supplement.  She will contact us if diarrhea is not relieved with Imodium and Lomotil.  Kathy Robinson will return for an office visit and chemotherapy in 2 weeks.  [name], MD  09/13/2020  10:53 AM

## 2020-09-13 NOTE — Patient Instructions (Signed)

## 2020-09-13 NOTE — Patient Instructions (Signed)
[facility] Discharge Instructions for Patients Receiving Chemotherapy  Today you received the following chemotherapy agents: bevacizumab/oxaliplatin/irinotecan/leucovorin/fluorouracil.   To help prevent nausea and vomiting after your treatment, we encourage you to take your nausea medication as directed.   If you develop nausea and vomiting that is not controlled by your nausea medication, call the clinic.   BELOW ARE SYMPTOMS THAT SHOULD BE REPORTED IMMEDIATELY:  *FEVER GREATER THAN 100.5 F  *CHILLS WITH OR WITHOUT FEVER  NAUSEA AND VOMITING THAT IS NOT CONTROLLED WITH YOUR NAUSEA MEDICATION  *UNUSUAL SHORTNESS OF BREATH  *UNUSUAL BRUISING OR BLEEDING  TENDERNESS IN MOUTH AND THROAT WITH OR WITHOUT PRESENCE OF ULCERS  *URINARY PROBLEMS  *BOWEL PROBLEMS  UNUSUAL RASH Items with * indicate a potential emergency and should be followed up as soon as possible.  Feel free to call the clinic should you have any questions or concerns. The clinic phone number is [phone].  Please show the [facility] at check-in to the Emergency Department and triage nurse.

## 2020-09-14 ENCOUNTER — Telehealth: Payer: Self-pay | Admitting: Oncology

## 2020-09-14 NOTE — Telephone Encounter (Signed)
Scheduled appointments per 10/26 los. Spoke to patient who is aware of appointments dates and times.

## 2020-09-15 ENCOUNTER — Other Ambulatory Visit: Payer: Self-pay

## 2020-09-15 ENCOUNTER — Inpatient Hospital Stay: Payer: Medicare Other

## 2020-09-15 VITALS — BP 142/78 | HR 88 | Temp 98.2°F | Resp 16

## 2020-09-15 DIAGNOSIS — C185 Malignant neoplasm of splenic flexure: Secondary | ICD-10-CM | POA: Diagnosis not present

## 2020-09-15 MED ORDER — PEGFILGRASTIM-CBQV 6 MG/0.6ML ~~LOC~~ SOSY
6.0000 mg | PREFILLED_SYRINGE | Freq: Once | SUBCUTANEOUS | Status: AC
  Administered 2020-09-15: 6 mg via SUBCUTANEOUS

## 2020-09-15 MED ORDER — SODIUM CHLORIDE 0.9% FLUSH
10.0000 mL | INTRAVENOUS | Status: DC | PRN
  Filled 2020-09-15: qty 10

## 2020-09-15 MED ORDER — HEPARIN SOD (PORK) LOCK FLUSH 100 UNIT/ML IV SOLN
500.0000 [IU] | Freq: Once | INTRAVENOUS | Status: DC | PRN
  Filled 2020-09-15: qty 5

## 2020-09-15 NOTE — Patient Instructions (Signed)

## 2020-09-22 ENCOUNTER — Telehealth: Payer: Self-pay | Admitting: *Deleted

## 2020-09-22 NOTE — Telephone Encounter (Signed)
Called to report she developed nausea/vomiting and diarrhea on 09/21/20. The compazine was not working, so she took Zofran and it helped. Asking if she needs to stop taking it 72 hours prior to chemo? Took Imodium for the diarrhea and it helped. Informed her OK to continue the Zofran up to the morning of her treatment and then hold for 72 hours. Suggested alternation the two meds. Reminded her she can take up to 8/day of Imodium and please call if it does not help.

## 2020-09-25 ENCOUNTER — Other Ambulatory Visit: Payer: Self-pay | Admitting: Oncology

## 2020-09-27 ENCOUNTER — Other Ambulatory Visit: Payer: Self-pay

## 2020-09-27 ENCOUNTER — Inpatient Hospital Stay: Payer: Medicare Other | Attending: Oncology

## 2020-09-27 ENCOUNTER — Other Ambulatory Visit: Payer: Self-pay | Admitting: Nurse Practitioner

## 2020-09-27 ENCOUNTER — Ambulatory Visit: Payer: Medicare Other

## 2020-09-27 ENCOUNTER — Other Ambulatory Visit: Payer: Medicare Other

## 2020-09-27 ENCOUNTER — Inpatient Hospital Stay: Payer: Medicare Other | Admitting: Nurse Practitioner

## 2020-09-27 ENCOUNTER — Inpatient Hospital Stay: Payer: Medicare Other

## 2020-09-27 ENCOUNTER — Ambulatory Visit: Payer: Medicare Other | Admitting: Nurse Practitioner

## 2020-09-27 ENCOUNTER — Encounter: Payer: Self-pay | Admitting: Nurse Practitioner

## 2020-09-27 VITALS — HR 105

## 2020-09-27 VITALS — BP 122/79 | HR 110 | Temp 98.0°F | Resp 15 | Ht 66.0 in | Wt 124.6 lb

## 2020-09-27 DIAGNOSIS — C185 Malignant neoplasm of splenic flexure: Secondary | ICD-10-CM | POA: Insufficient documentation

## 2020-09-27 DIAGNOSIS — Z5111 Encounter for antineoplastic chemotherapy: Secondary | ICD-10-CM | POA: Insufficient documentation

## 2020-09-27 DIAGNOSIS — Z5189 Encounter for other specified aftercare: Secondary | ICD-10-CM | POA: Insufficient documentation

## 2020-09-27 DIAGNOSIS — D709 Neutropenia, unspecified: Secondary | ICD-10-CM | POA: Diagnosis not present

## 2020-09-27 DIAGNOSIS — R112 Nausea with vomiting, unspecified: Secondary | ICD-10-CM | POA: Diagnosis not present

## 2020-09-27 DIAGNOSIS — T451X5A Adverse effect of antineoplastic and immunosuppressive drugs, initial encounter: Secondary | ICD-10-CM | POA: Diagnosis not present

## 2020-09-27 DIAGNOSIS — G620 Drug-induced polyneuropathy: Secondary | ICD-10-CM | POA: Diagnosis not present

## 2020-09-27 DIAGNOSIS — I4891 Unspecified atrial fibrillation: Secondary | ICD-10-CM | POA: Diagnosis not present

## 2020-09-27 DIAGNOSIS — Z95828 Presence of other vascular implants and grafts: Secondary | ICD-10-CM

## 2020-09-27 DIAGNOSIS — E876 Hypokalemia: Secondary | ICD-10-CM | POA: Diagnosis not present

## 2020-09-27 LAB — CMP (CANCER CENTER ONLY)
ALT: 14 U/L (ref 0–44)
AST: 21 U/L (ref 15–41)
Albumin: 3 g/dL — ABNORMAL LOW (ref 3.5–5.0)
Alkaline Phosphatase: 92 U/L (ref 38–126)
Anion gap: 8 (ref 5–15)
BUN: 15 mg/dL (ref 8–23)
CO2: 25 mmol/L (ref 22–32)
Calcium: 8.7 mg/dL — ABNORMAL LOW (ref 8.9–10.3)
Chloride: 104 mmol/L (ref 98–111)
Creatinine: 0.82 mg/dL (ref 0.44–1.00)
GFR, Estimated: >60 mL/min (ref >60)
Glucose, Bld: 130 mg/dL — ABNORMAL HIGH (ref 70–99)
Potassium: 3.8 mmol/L (ref 3.5–5.1)
Sodium: 137 mmol/L (ref 135–145)
Total Bilirubin: 0.4 mg/dL (ref 0.3–1.2)
Total Protein: 6.4 g/dL — ABNORMAL LOW (ref 6.5–8.1)

## 2020-09-27 LAB — CBC WITH DIFFERENTIAL (CANCER CENTER ONLY)
Abs Immature Granulocytes: 0.03 10*3/uL (ref 0.00–0.07)
Basophils Absolute: 0 10*3/uL (ref 0.0–0.1)
Basophils Relative: 1 %
Eosinophils Absolute: 0.1 10*3/uL (ref 0.0–0.5)
Eosinophils Relative: 2 %
HCT: 35.1 % — ABNORMAL LOW (ref 36.0–46.0)
Hemoglobin: 11.6 g/dL — ABNORMAL LOW (ref 12.0–15.0)
Immature Granulocytes: 1 %
Lymphocytes Relative: 18 %
Lymphs Abs: 1 10*3/uL (ref 0.7–4.0)
MCH: 32.3 pg (ref 26.0–34.0)
MCHC: 33 g/dL (ref 30.0–36.0)
MCV: 97.8 fL (ref 80.0–100.0)
Monocytes Absolute: 0.9 10*3/uL (ref 0.1–1.0)
Monocytes Relative: 15 %
Neutro Abs: 3.6 10*3/uL (ref 1.7–7.7)
Neutrophils Relative %: 63 %
Platelet Count: 200 10*3/uL (ref 150–400)
RBC: 3.59 MIL/uL — ABNORMAL LOW (ref 3.87–5.11)
RDW: 17.7 % — ABNORMAL HIGH (ref 11.5–15.5)
WBC Count: 5.7 10*3/uL (ref 4.0–10.5)
nRBC: 0 % (ref 0.0–0.2)

## 2020-09-27 LAB — CEA (IN HOUSE-CHCC): CEA (CHCC-In House): <1 ng/mL (ref 0.00–5.00)

## 2020-09-27 LAB — MAGNESIUM: Magnesium: 1.6 mg/dL — ABNORMAL LOW (ref 1.7–2.4)

## 2020-09-27 MED ORDER — SODIUM CHLORIDE 0.9 % IV SOLN
5.0000 mg/kg | Freq: Once | INTRAVENOUS | Status: AC
  Administered 2020-09-27: 300 mg via INTRAVENOUS
  Filled 2020-09-27: qty 12

## 2020-09-27 MED ORDER — DEXTROSE 5 % IV SOLN
Freq: Once | INTRAVENOUS | Status: AC
  Filled 2020-09-27: qty 250

## 2020-09-27 MED ORDER — PALONOSETRON HCL INJECTION 0.25 MG/5ML
0.2500 mg | Freq: Once | INTRAVENOUS | Status: AC
  Administered 2020-09-27: 0.25 mg via INTRAVENOUS

## 2020-09-27 MED ORDER — SODIUM CHLORIDE 0.9 % IV SOLN
150.0000 mg | Freq: Once | INTRAVENOUS | Status: AC
  Administered 2020-09-27: 150 mg via INTRAVENOUS
  Filled 2020-09-27: qty 150

## 2020-09-27 MED ORDER — MAGNESIUM OXIDE 400 (241.3 MG) MG PO TABS: 400.0000 mg | ORAL_TABLET | Freq: Two times a day (BID) | ORAL | 1 refills | Status: AC

## 2020-09-27 MED ORDER — SODIUM CHLORIDE 0.9 % IV SOLN
Freq: Once | INTRAVENOUS | Status: AC
  Filled 2020-09-27: qty 250

## 2020-09-27 MED ORDER — SODIUM CHLORIDE 0.9 % IV SOLN
2000.0000 mg/m2 | INTRAVENOUS | Status: DC
  Administered 2020-09-27: 3400 mg via INTRAVENOUS
  Filled 2020-09-27: qty 68

## 2020-09-27 MED ORDER — LEUCOVORIN CALCIUM INJECTION 350 MG
400.0000 mg/m2 | Freq: Once | INTRAVENOUS | Status: AC
  Administered 2020-09-27: 676 mg via INTRAVENOUS
  Filled 2020-09-27: qty 33.8

## 2020-09-27 MED ORDER — SODIUM CHLORIDE 0.9% FLUSH
10.0000 mL | Freq: Once | INTRAVENOUS | Status: AC
  Administered 2020-09-27: 10 mL
  Filled 2020-09-27: qty 10

## 2020-09-27 MED ORDER — HEPARIN SOD (PORK) LOCK FLUSH 100 UNIT/ML IV SOLN
500.0000 [IU] | Freq: Once | INTRAVENOUS | Status: DC | PRN
  Filled 2020-09-27: qty 5

## 2020-09-27 MED ORDER — SODIUM CHLORIDE 0.9% FLUSH
10.0000 mL | INTRAVENOUS | Status: DC | PRN
  Filled 2020-09-27: qty 10

## 2020-09-27 MED ORDER — ATROPINE SULFATE 1 MG/ML IJ SOLN: 0.5000 mg | Freq: Once | INTRAMUSCULAR | Status: DC | PRN

## 2020-09-27 MED ORDER — OXALIPLATIN CHEMO INJECTION 100 MG/20ML
87.5000 mg/m2 | Freq: Once | INTRAVENOUS | Status: AC
  Administered 2020-09-27: 150 mg via INTRAVENOUS
  Filled 2020-09-27: qty 20

## 2020-09-27 MED ORDER — DEXAMETHASONE 4 MG PO TABS: 4.0000 mg | ORAL_TABLET | Freq: Two times a day (BID) | ORAL | 0 refills | Status: DC

## 2020-09-27 MED ORDER — SODIUM CHLORIDE 0.9 % IV SOLN
10.0000 mg | Freq: Once | INTRAVENOUS | Status: AC
  Administered 2020-09-27: 10 mg via INTRAVENOUS
  Filled 2020-09-27: qty 10

## 2020-09-27 MED ORDER — PALONOSETRON HCL INJECTION 0.25 MG/5ML
INTRAVENOUS | Status: AC
  Filled 2020-09-27: qty 5

## 2020-09-27 MED FILL — DEXAMETHASONE 4 MG TABLET: 4 | 3 days supply | Qty: 6 | Fill #0

## 2020-09-27 NOTE — Progress Notes (Signed)
[facility] OFFICE PROGRESS NOTE   Diagnosis: Colon cancer  INTERVAL HISTORY:   Kathy Robinson returns as scheduled.  She completed cycle 6 FOLFOXIRI/Avastin 09/13/2020.  She had intermittent nausea and 2 episodes of vomiting.  She has Compazine and Zofran at home.  No mouth sores.  Some loose stools following chemotherapy.  Not a significant problem.  No mouth sores.  No numbness or tingling in the hands or feet.  Cold sensitivity tends to last 3 or 4 days.  She notes she is more fatigued.  Objective:  Vital signs in last 24 hours:  Blood pressure 122/79, pulse (!) 110, temperature 98 F (36.7 C), temperature source Tympanic, resp. rate 15, height $RemoveBe'5\' 6"'PnwBGzEsV$  (1.676 m), weight 124 lb 9.6 oz (56.5 kg), last menstrual period 11/19/1996, SpO2 99 %.    HEENT: No thrush or ulcers. Resp: Lungs clear bilaterally. Cardio: Regular rate and rhythm. GI: Abdomen soft and nontender.  No hepatomegaly. Vascular: No leg edema. Skin: Palms without erythema. Port-A-Cath without erythema.   Lab Results:  Lab Results  Component Value Date   WBC 5.7 09/27/2020   HGB 11.6 (L) 09/27/2020   HCT 35.1 (L) 09/27/2020   MCV 97.8 09/27/2020   PLT 200 09/27/2020   NEUTROABS 3.6 09/27/2020    Imaging:  No results found.  Medications: I have reviewed the patient's current medications.  Assessment/Plan: 1. Adenocarcinoma the left colon, stage IIIc (pT4b,pN2a), status post a left colectomy 05/30/2020, MSS, TMB 13, BRAF V600E ? Tumor invades the visceral peritoneum, lymphovascular and perineural invasion present, for tumor deposits, 7/13 lymph nodes, negative resection margins, no loss of mismatch repair protein expression ? CT abdomen/pelvis 05/26/2020-long segment of masslike thickening of the descending colon with associated high-grade colonic obstruction, small colonic wall defect with evidence of a focally contained microperforation, retroperitoneal adenopathy, indeterminate small hypodense  liver lesions ? Elevated preoperative CEA ? PET8/02/2020-hypermetabolic liver metastases, periportal and periaortic adenopathy, hypermetabolic mediastinal and left supraclavicular nodes ? Cycle 1 FOLFOX 07/05/20  ? Cycle 2 FOLFOXIRI (dose-reduced 5FU, no bolus) and Avastin 07/19/20 ? Cycle 3 FOLFOXIRI (Dose reduced 5-FU, no bolus)/Avastin 08/02/2020 ? Cycle 4 FOLFOXIRI/Avastin 08/16/2020 (Udenyca added) ? Cycle 5 FOLFOXIRI/Avastin 08/30/2020 ? CTs 09/09/2020-diminished size of lymph nodes in the chest, abdomen, and pelvis.  Decreased hepatic metastases.  No new evidence of disease progression, fat density lesion surrounding the left hemicolectomy ? Cycle 6 FOLFOXIRI/Avastin 09/13/2020 ? Cycle 7 FOLFOX/Avastin 09/27/2020 (irinotecan held) 2. Atrial fibrillation with rapid ventricular response 05/26/2020 3. Mild neutropenia following cycle 3 FOLFOXIRI, Udenyca added with cycle 4 4. Hypokalemia secondary to diarrhea-potassium supplementation starting 09/13/2020 5. 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 appears stable.  She has completed 6 cycles of FOLFOXIRI plus Avastin.  She had delayed nausea and has lost some weight since completing cycle 6.  We decided to go ahead with treatment today as scheduled but will hold irinotecan.  She will take Decadron 4 mg twice daily for 3 days beginning 09/28/2020.  She understands to contact the office if she has nausea despite these changes.  We reviewed the CBC and chemistry panel from today.  Labs adequate to proceed with treatment.  She has mild hypomagnesemia.  She will begin magnesium oxide 400 mg twice daily.  She will return for lab, follow-up, treatment in 3 weeks.  She will contact the office in the interim as outlined above or with any other problems.  Plan reviewed with Dr. Benay Spice.  Ned Card ANP/GNP-BC   09/27/2020  10:59 AM

## 2020-09-27 NOTE — Progress Notes (Signed)
Per Lattie Haw OK to treat with HR 105

## 2020-09-27 NOTE — Patient Instructions (Signed)
[facility] Discharge Instructions for Patients Receiving Chemotherapy  Today you received the following chemotherapy agents: bevacizumab/oxaliplatin/leucovorin/fluorouracil.   To help prevent nausea and vomiting after your treatment, we encourage you to take your nausea medication as directed.   If you develop nausea and vomiting that is not controlled by your nausea medication, call the clinic.   BELOW ARE SYMPTOMS THAT SHOULD BE REPORTED IMMEDIATELY:  *FEVER GREATER THAN 100.5 F  *CHILLS WITH OR WITHOUT FEVER  NAUSEA AND VOMITING THAT IS NOT CONTROLLED WITH YOUR NAUSEA MEDICATION  *UNUSUAL SHORTNESS OF BREATH  *UNUSUAL BRUISING OR BLEEDING  TENDERNESS IN MOUTH AND THROAT WITH OR WITHOUT PRESENCE OF ULCERS  *URINARY PROBLEMS  *BOWEL PROBLEMS  UNUSUAL RASH Items with * indicate a potential emergency and should be followed up as soon as possible.  Feel free to call the clinic should you have any questions or concerns. The clinic phone number is [phone].  Please show the [place] at check-in to the Emergency Department and triage nurse.

## 2020-09-28 ENCOUNTER — Telehealth: Payer: Self-pay | Admitting: Nurse Practitioner

## 2020-09-28 ENCOUNTER — Ambulatory Visit: Payer: Medicare Other | Admitting: Cardiovascular Disease

## 2020-09-28 NOTE — Telephone Encounter (Signed)
Scheduled appointments per 11/9 los. Will have updated calendar printed for patient at next visit.

## 2020-09-29 ENCOUNTER — Other Ambulatory Visit: Payer: Self-pay

## 2020-09-29 ENCOUNTER — Telehealth: Payer: Self-pay | Admitting: *Deleted

## 2020-09-29 ENCOUNTER — Inpatient Hospital Stay: Payer: Medicare Other

## 2020-09-29 DIAGNOSIS — C185 Malignant neoplasm of splenic flexure: Secondary | ICD-10-CM

## 2020-09-29 MED ORDER — SODIUM CHLORIDE 0.9% FLUSH
10.0000 mL | INTRAVENOUS | Status: DC | PRN
  Administered 2020-09-29: 10 mL
  Filled 2020-09-29: qty 10

## 2020-09-29 MED ORDER — PEGFILGRASTIM-CBQV 6 MG/0.6ML ~~LOC~~ SOSY
6.0000 mg | PREFILLED_SYRINGE | Freq: Once | SUBCUTANEOUS | Status: AC
  Administered 2020-09-29: 6 mg via SUBCUTANEOUS

## 2020-09-29 MED ORDER — HEPARIN SOD (PORK) LOCK FLUSH 100 UNIT/ML IV SOLN
500.0000 [IU] | Freq: Once | INTRAVENOUS | Status: AC | PRN
  Administered 2020-09-29: 500 [IU]
  Filled 2020-09-29: qty 5

## 2020-09-29 NOTE — Telephone Encounter (Signed)
Called pt to f/u about appt to d/c pump today. Apt was at 2pm. Made several attempts with no response. Charge nurse [facility] made aware.

## 2020-09-29 NOTE — Patient Instructions (Signed)

## 2020-10-16 ENCOUNTER — Other Ambulatory Visit: Payer: Self-pay | Admitting: Oncology

## 2020-10-18 ENCOUNTER — Inpatient Hospital Stay (HOSPITAL_BASED_OUTPATIENT_CLINIC_OR_DEPARTMENT_OTHER): Payer: Medicare Other | Admitting: Oncology

## 2020-10-18 ENCOUNTER — Inpatient Hospital Stay: Payer: Medicare Other

## 2020-10-18 ENCOUNTER — Other Ambulatory Visit: Payer: Self-pay

## 2020-10-18 ENCOUNTER — Other Ambulatory Visit: Payer: Self-pay | Admitting: *Deleted

## 2020-10-18 VITALS — BP 126/69 | HR 99 | Temp 98.7°F | Resp 17 | Ht 66.0 in | Wt 127.6 lb

## 2020-10-18 DIAGNOSIS — C185 Malignant neoplasm of splenic flexure: Secondary | ICD-10-CM

## 2020-10-18 DIAGNOSIS — Z95828 Presence of other vascular implants and grafts: Secondary | ICD-10-CM

## 2020-10-18 LAB — CBC WITH DIFFERENTIAL (CANCER CENTER ONLY)
Abs Immature Granulocytes: 0.04 10*3/uL (ref 0.00–0.07)
Basophils Absolute: 0 10*3/uL (ref 0.0–0.1)
Basophils Relative: 1 %
Eosinophils Absolute: 0.1 10*3/uL (ref 0.0–0.5)
Eosinophils Relative: 2 %
HCT: 34.2 % — ABNORMAL LOW (ref 36.0–46.0)
Hemoglobin: 11.1 g/dL — ABNORMAL LOW (ref 12.0–15.0)
Immature Granulocytes: 1 %
Lymphocytes Relative: 20 %
Lymphs Abs: 1.4 10*3/uL (ref 0.7–4.0)
MCH: 33.1 pg (ref 26.0–34.0)
MCHC: 32.5 g/dL (ref 30.0–36.0)
MCV: 102.1 fL — ABNORMAL HIGH (ref 80.0–100.0)
Monocytes Absolute: 0.6 10*3/uL (ref 0.1–1.0)
Monocytes Relative: 9 %
Neutro Abs: 4.8 10*3/uL (ref 1.7–7.7)
Neutrophils Relative %: 67 %
Platelet Count: 240 10*3/uL (ref 150–400)
RBC: 3.35 MIL/uL — ABNORMAL LOW (ref 3.87–5.11)
RDW: 18.9 % — ABNORMAL HIGH (ref 11.5–15.5)
WBC Count: 6.9 10*3/uL (ref 4.0–10.5)
nRBC: 0 % (ref 0.0–0.2)

## 2020-10-18 LAB — CMP (CANCER CENTER ONLY)
ALT: 44 U/L (ref 0–44)
AST: 39 U/L (ref 15–41)
Albumin: 3.1 g/dL — ABNORMAL LOW (ref 3.5–5.0)
Alkaline Phosphatase: 113 U/L (ref 38–126)
Anion gap: 6 (ref 5–15)
BUN: 19 mg/dL (ref 8–23)
CO2: 25 mmol/L (ref 22–32)
Calcium: 9 mg/dL (ref 8.9–10.3)
Chloride: 107 mmol/L (ref 98–111)
Creatinine: 0.74 mg/dL (ref 0.44–1.00)
GFR, Estimated: >60 mL/min (ref >60)
Glucose, Bld: 104 mg/dL — ABNORMAL HIGH (ref 70–99)
Potassium: 4 mmol/L (ref 3.5–5.1)
Sodium: 138 mmol/L (ref 135–145)
Total Bilirubin: 0.3 mg/dL (ref 0.3–1.2)
Total Protein: 6.4 g/dL — ABNORMAL LOW (ref 6.5–8.1)

## 2020-10-18 LAB — CEA (IN HOUSE-CHCC): CEA (CHCC-In House): 1.19 ng/mL (ref 0.00–5.00)

## 2020-10-18 LAB — TOTAL PROTEIN, URINE DIPSTICK: Protein, ur: NEGATIVE mg/dL

## 2020-10-18 MED ORDER — DEXTROSE 5 % IV SOLN
Freq: Once | INTRAVENOUS | Status: DC
  Filled 2020-10-18: qty 250

## 2020-10-18 MED ORDER — ATROPINE SULFATE 1 MG/ML IJ SOLN
0.5000 mg | Freq: Once | INTRAMUSCULAR | Status: AC | PRN
  Administered 2020-10-18: 0.5 mg via INTRAVENOUS

## 2020-10-18 MED ORDER — PALONOSETRON HCL INJECTION 0.25 MG/5ML
INTRAVENOUS | Status: AC
  Filled 2020-10-18: qty 5

## 2020-10-18 MED ORDER — SODIUM CHLORIDE 0.9% FLUSH
10.0000 mL | Freq: Once | INTRAVENOUS | Status: AC
  Administered 2020-10-18: 10 mL
  Filled 2020-10-18: qty 10

## 2020-10-18 MED ORDER — SODIUM CHLORIDE 0.9 % IV SOLN
2000.0000 mg/m2 | INTRAVENOUS | Status: DC
  Administered 2020-10-18: 3400 mg via INTRAVENOUS
  Filled 2020-10-18: qty 68

## 2020-10-18 MED ORDER — SODIUM CHLORIDE 0.9% FLUSH
10.0000 mL | INTRAVENOUS | Status: DC | PRN
  Filled 2020-10-18: qty 10

## 2020-10-18 MED ORDER — PALONOSETRON HCL INJECTION 0.25 MG/5ML
0.2500 mg | Freq: Once | INTRAVENOUS | Status: AC
  Administered 2020-10-18: 0.25 mg via INTRAVENOUS

## 2020-10-18 MED ORDER — SODIUM CHLORIDE 0.9 % IV SOLN
150.0000 mg/m2 | Freq: Once | INTRAVENOUS | Status: AC
  Administered 2020-10-18: 260 mg via INTRAVENOUS
  Filled 2020-10-18: qty 13

## 2020-10-18 MED ORDER — SODIUM CHLORIDE 0.9 % IV SOLN
Freq: Once | INTRAVENOUS | Status: AC
  Filled 2020-10-18: qty 250

## 2020-10-18 MED ORDER — SODIUM CHLORIDE 0.9 % IV SOLN
150.0000 mg | Freq: Once | INTRAVENOUS | Status: AC
  Administered 2020-10-18: 150 mg via INTRAVENOUS
  Filled 2020-10-18: qty 150

## 2020-10-18 MED ORDER — SODIUM CHLORIDE 0.9 % IV SOLN
5.0000 mg/kg | Freq: Once | INTRAVENOUS | Status: AC
  Administered 2020-10-18: 300 mg via INTRAVENOUS
  Filled 2020-10-18: qty 12

## 2020-10-18 MED ORDER — DEXAMETHASONE 4 MG PO TABS: 4.0000 mg | ORAL_TABLET | Freq: Two times a day (BID) | ORAL | 3 refills | Status: DC

## 2020-10-18 MED ORDER — ONDANSETRON HCL 8 MG PO TABS: 8.0000 mg | ORAL_TABLET | Freq: Three times a day (TID) | ORAL | 1 refills | Status: DC | PRN

## 2020-10-18 MED ORDER — DEXTROSE 5 % IV SOLN
Freq: Once | INTRAVENOUS | Status: AC
  Filled 2020-10-18: qty 250

## 2020-10-18 MED ORDER — OXALIPLATIN CHEMO INJECTION 100 MG/20ML
85.0000 mg/m2 | Freq: Once | INTRAVENOUS | Status: AC
  Administered 2020-10-18: 145 mg via INTRAVENOUS
  Filled 2020-10-18: qty 29

## 2020-10-18 MED ORDER — HEPARIN SOD (PORK) LOCK FLUSH 100 UNIT/ML IV SOLN
500.0000 [IU] | Freq: Once | INTRAVENOUS | Status: DC | PRN
  Filled 2020-10-18: qty 5

## 2020-10-18 MED ORDER — ATROPINE SULFATE 1 MG/ML IJ SOLN
INTRAMUSCULAR | Status: AC
  Filled 2020-10-18: qty 1

## 2020-10-18 MED ORDER — SODIUM CHLORIDE 0.9 % IV SOLN
10.0000 mg | Freq: Once | INTRAVENOUS | Status: AC
  Administered 2020-10-18: 10 mg via INTRAVENOUS
  Filled 2020-10-18: qty 10

## 2020-10-18 MED ORDER — LEUCOVORIN CALCIUM INJECTION 350 MG
400.0000 mg/m2 | Freq: Once | INTRAVENOUS | Status: AC
  Administered 2020-10-18: 676 mg via INTRAVENOUS
  Filled 2020-10-18: qty 33.8

## 2020-10-18 MED FILL — POTASSIUM CL ER 10 MEQ CAP: 10 | 30 days supply | Qty: 60 | Fill #1

## 2020-10-18 MED FILL — ONDANSETRON HCL 8 MG TABLET: 8 | 21 days supply | Qty: 18 | Fill #0

## 2020-10-18 MED FILL — DEXAMETHASONE 4 MG TABLET: 4 | 6 days supply | Qty: 12 | Fill #0

## 2020-10-18 NOTE — Progress Notes (Signed)
[facility] OFFICE PROGRESS NOTE   Diagnosis: Colon cancer  INTERVAL HISTORY:   Ms. Quant returns as scheduled.  She completed a cycle of FOLFOX on 09/27/2020.  Nausea resolved after she took Decadron.  No bleeding or symptom of thrombosis.  No diarrhea at present.  She reports feeling well and has a good appetite.  Objective:  Vital signs in last 24 hours:  Blood pressure 126/69, pulse 99, temperature 98.7 F (37.1 C), temperature source Tympanic, resp. rate 17, height 5' 6" (1.676 m), weight 127 lb 9.6 oz (57.9 kg), last menstrual period 11/19/1996, SpO2 100 %.    HEENT: No thrush or ulcers Resp: Lungs clear bilaterally Cardio: Regular rate and rhythm GI: No hepatosplenomegaly, nontender Vascular: No leg edema  Skin: Dryness of the palms  Portacath/PICC-without erythema  Lab Results:  Lab Results  Component Value Date   WBC 6.9 10/18/2020   HGB 11.1 (L) 10/18/2020   HCT 34.2 (L) 10/18/2020   MCV 102.1 (H) 10/18/2020   PLT 240 10/18/2020   NEUTROABS 4.8 10/18/2020    CMP  Lab Results  Component Value Date   NA 137 09/27/2020   K 3.8 09/27/2020   CL 104 09/27/2020   CO2 25 09/27/2020   GLUCOSE 130 (H) 09/27/2020   BUN 15 09/27/2020   CREATININE 0.82 09/27/2020   CALCIUM 8.7 (L) 09/27/2020   PROT 6.4 (L) 09/27/2020   ALBUMIN 3.0 (L) 09/27/2020   AST 21 09/27/2020   ALT 14 09/27/2020   ALKPHOS 92 09/27/2020   BILITOT 0.4 09/27/2020   GFRNONAA >60 09/27/2020   GFRAA >60 08/16/2020    Lab Results  Component Value Date   CEA1 <1.00 09/27/2020     Medications: I have reviewed the patient's current medications.   Assessment/Plan:  1. Adenocarcinoma the left colon, stage IIIc (pT4b,pN2a), status post a left colectomy 05/30/2020, MSS, TMB 13, BRAF V600E ? Tumor invades the visceral peritoneum, lymphovascular and perineural invasion present, for tumor deposits, 7/13 lymph nodes, negative resection margins, no loss of mismatch repair  protein expression ? CT abdomen/pelvis 05/26/2020-long segment of masslike thickening of the descending colon with associated high-grade colonic obstruction, small colonic wall defect with evidence of a focally contained microperforation, retroperitoneal adenopathy, indeterminate small hypodense liver lesions ? Elevated preoperative CEA ? PET8/02/2020-hypermetabolic liver metastases, periportal and periaortic adenopathy, hypermetabolic mediastinal and left supraclavicular nodes ? Cycle 1 FOLFOX 07/05/20  ? Cycle 2 FOLFOXIRI (dose-reduced 5FU, no bolus) and Avastin 07/19/20 ? Cycle 3 FOLFOXIRI (Dose reduced 5-FU, no bolus)/Avastin 08/02/2020 ? Cycle 4 FOLFOXIRI/Avastin 08/16/2020 (Udenyca added) ? Cycle 5 FOLFOXIRI/Avastin 08/30/2020 ? CTs 09/09/2020-diminished size of lymph nodes in the chest, abdomen, and pelvis.  Decreased hepatic metastases.  No new evidence of disease progression, fat density lesion surrounding the left hemicolectomy ? Cycle 6 FOLFOXIRI/Avastin 09/13/2020 ? Cycle 7 FOLFOX/Avastin 09/27/2020 (irinotecan held) ? Cycle 8 FOLFOXIRI/Avastin 10/18/2020 2. Atrial fibrillation with rapid ventricular response 05/26/2020 3. Mild neutropenia following cycle 3 FOLFOXIRI, Udenyca added with cycle 4 4. Hypokalemia secondary to diarrhea-potassium supplementation starting 09/13/2020 5. 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 Furgeson appears stable.  She will complete another cycle of FOLFOXIRI/Avastin today.  She will complete 3 days of Decadron prophylaxis beginning on day 3.  She will contact us for nausea following this cycle of chemotherapy.  She will return for an office visit in the next cycle of chemotherapy in 2 weeks.  The plan is to schedule a restaging CT  evaluation after cycle 10.  [name], MD  10/18/2020  10:00 AM

## 2020-10-18 NOTE — Patient Instructions (Signed)
[facility] Discharge Instructions for Patients Receiving Chemotherapy  Today you received the following chemotherapy agents: bevacizumab/oxaliplatin/leucovorin/fluorouracil.   To help prevent nausea and vomiting after your treatment, we encourage you to take your nausea medication as directed.   If you develop nausea and vomiting that is not controlled by your nausea medication, call the clinic.   BELOW ARE SYMPTOMS THAT SHOULD BE REPORTED IMMEDIATELY:  *FEVER GREATER THAN 100.5 F  *CHILLS WITH OR WITHOUT FEVER  NAUSEA AND VOMITING THAT IS NOT CONTROLLED WITH YOUR NAUSEA MEDICATION  *UNUSUAL SHORTNESS OF BREATH  *UNUSUAL BRUISING OR BLEEDING  TENDERNESS IN MOUTH AND THROAT WITH OR WITHOUT PRESENCE OF ULCERS  *URINARY PROBLEMS  *BOWEL PROBLEMS  UNUSUAL RASH Items with * indicate a potential emergency and should be followed up as soon as possible.  Feel free to call the clinic should you have any questions or concerns. The clinic phone number is [phone].  Please show the [facility] at check-in to the Emergency Department and triage nurse.

## 2020-10-19 ENCOUNTER — Telehealth: Payer: Self-pay | Admitting: Oncology

## 2020-10-19 MED ORDER — FAMOTIDINE IN NACL 20-0.9 MG/50ML-% IV SOLN
INTRAVENOUS | Status: AC
  Filled 2020-10-19: qty 50

## 2020-10-19 MED ORDER — DIPHENHYDRAMINE HCL 50 MG/ML IJ SOLN
INTRAMUSCULAR | Status: AC
  Filled 2020-10-19: qty 1

## 2020-10-19 MED ORDER — METHYLPREDNISOLONE SODIUM SUCC 125 MG IJ SOLR
INTRAMUSCULAR | Status: AC
  Filled 2020-10-19: qty 2

## 2020-10-19 NOTE — Telephone Encounter (Signed)
Scheduled appointments per 11/30 los. Spoke to patient who is aware of appointments dates and times.  °

## 2020-10-20 ENCOUNTER — Other Ambulatory Visit: Payer: Self-pay

## 2020-10-20 ENCOUNTER — Inpatient Hospital Stay: Payer: Medicare Other | Attending: Oncology

## 2020-10-20 VITALS — BP 126/87 | HR 89 | Temp 98.6°F | Resp 18

## 2020-10-20 DIAGNOSIS — G620 Drug-induced polyneuropathy: Secondary | ICD-10-CM | POA: Insufficient documentation

## 2020-10-20 DIAGNOSIS — T451X5A Adverse effect of antineoplastic and immunosuppressive drugs, initial encounter: Secondary | ICD-10-CM | POA: Diagnosis not present

## 2020-10-20 DIAGNOSIS — D709 Neutropenia, unspecified: Secondary | ICD-10-CM | POA: Diagnosis not present

## 2020-10-20 DIAGNOSIS — I4891 Unspecified atrial fibrillation: Secondary | ICD-10-CM | POA: Insufficient documentation

## 2020-10-20 DIAGNOSIS — C185 Malignant neoplasm of splenic flexure: Secondary | ICD-10-CM | POA: Diagnosis not present

## 2020-10-20 DIAGNOSIS — E876 Hypokalemia: Secondary | ICD-10-CM | POA: Diagnosis not present

## 2020-10-20 DIAGNOSIS — Z5111 Encounter for antineoplastic chemotherapy: Secondary | ICD-10-CM | POA: Insufficient documentation

## 2020-10-20 DIAGNOSIS — Z5189 Encounter for other specified aftercare: Secondary | ICD-10-CM | POA: Insufficient documentation

## 2020-10-20 DIAGNOSIS — C787 Secondary malignant neoplasm of liver and intrahepatic bile duct: Secondary | ICD-10-CM | POA: Diagnosis not present

## 2020-10-20 MED ORDER — PEGFILGRASTIM-CBQV 6 MG/0.6ML ~~LOC~~ SOSY
6.0000 mg | PREFILLED_SYRINGE | Freq: Once | SUBCUTANEOUS | Status: AC
  Administered 2020-10-20: 6 mg via SUBCUTANEOUS

## 2020-10-20 MED ORDER — PEGFILGRASTIM-CBQV 6 MG/0.6ML ~~LOC~~ SOSY
PREFILLED_SYRINGE | SUBCUTANEOUS | Status: AC
  Filled 2020-10-20: qty 0.6

## 2020-10-20 MED ORDER — HEPARIN SOD (PORK) LOCK FLUSH 100 UNIT/ML IV SOLN
500.0000 [IU] | Freq: Once | INTRAVENOUS | Status: AC | PRN
  Administered 2020-10-20: 500 [IU]
  Filled 2020-10-20: qty 5

## 2020-10-20 MED ORDER — SODIUM CHLORIDE 0.9% FLUSH
10.0000 mL | INTRAVENOUS | Status: DC | PRN
  Administered 2020-10-20: 10 mL
  Filled 2020-10-20: qty 10

## 2020-10-20 NOTE — Patient Instructions (Addendum)
Pegfilgrastim injection What is this medicine? PEGFILGRASTIM (PEG fil gra stim) is a long-acting granulocyte colony-stimulating factor that stimulates the growth of neutrophils, a type of white blood cell important in the body's fight against infection. It is used to reduce the incidence of fever and infection in patients with certain types of cancer who are receiving chemotherapy that affects the bone marrow, and to increase survival after being exposed to high doses of radiation. This medicine may be used for other purposes; ask your health care provider or pharmacist if you have questions. COMMON BRAND NAME(S): Fulphila, Neulasta, UDENYCA, Ziextenzo What should I tell my health care provider before I take this medicine? They need to know if you have any of these conditions:  kidney disease  latex allergy  ongoing radiation therapy  sickle cell disease  skin reactions to acrylic adhesives (On-Body Injector only)  an unusual or allergic reaction to pegfilgrastim, filgrastim, other medicines, foods, dyes, or preservatives  pregnant or trying to get pregnant  breast-feeding How should I use this medicine? This medicine is for injection under the skin. If you get this medicine at home, you will be taught how to prepare and give the pre-filled syringe or how to use the On-body Injector. Refer to the patient Instructions for Use for detailed instructions. Use exactly as directed. Tell your healthcare provider immediately if you suspect that the On-body Injector may not have performed as intended or if you suspect the use of the On-body Injector resulted in a missed or partial dose. It is important that you put your used needles and syringes in a special sharps container. Do not put them in a trash can. If you do not have a sharps container, call your pharmacist or healthcare provider to get one. Talk to your pediatrician regarding the use of this medicine in children. While this drug may be  prescribed for selected conditions, precautions do apply. Overdosage: If you think you have taken too much of this medicine contact a poison control center or emergency room at once. NOTE: This medicine is only for you. Do not share this medicine with others. What if I miss a dose? It is important not to miss your dose. Call your doctor or health care professional if you miss your dose. If you miss a dose due to an On-body Injector failure or leakage, a new dose should be administered as soon as possible using a single prefilled syringe for manual use. What may interact with this medicine? Interactions have not been studied. Give your health care provider a list of all the medicines, herbs, non-prescription drugs, or dietary supplements you use. Also tell them if you smoke, drink alcohol, or use illegal drugs. Some items may interact with your medicine. This list may not describe all possible interactions. Give your health care provider a list of all the medicines, herbs, non-prescription drugs, or dietary supplements you use. Also tell them if you smoke, drink alcohol, or use illegal drugs. Some items may interact with your medicine. What should I watch for while using this medicine? You may need blood work done while you are taking this medicine. If you are going to need a MRI, CT scan, or other procedure, tell your doctor that you are using this medicine (On-Body Injector only). What side effects may I notice from receiving this medicine? Side effects that you should report to your doctor or health care professional as soon as possible:  allergic reactions like skin rash, itching or hives, swelling of the   face, lips, or tongue  back pain  dizziness  fever  pain, redness, or irritation at site where injected  pinpoint red spots on the skin  red or dark-brown urine  shortness of breath or breathing problems  stomach or side pain, or pain at the  shoulder  swelling  tiredness  trouble passing urine or change in the amount of urine Side effects that usually do not require medical attention (report to your doctor or health care professional if they continue or are bothersome):  bone pain  muscle pain This list may not describe all possible side effects. Call your doctor for medical advice about side effects. You may report side effects to FDA at 1-800-FDA-1088. Where should I keep my medicine? Keep out of the reach of children. If you are using this medicine at home, you will be instructed on how to store it. Throw away any unused medicine after the expiration date on the label. NOTE: This sheet is a summary. It may not cover all possible information. If you have questions about this medicine, talk to your doctor, pharmacist, or health care provider.  2020 Elsevier/Gold Standard (2018-02-10 16:57:08) Implanted Port Home Guide An implanted port is a device that is placed under the skin. It is usually placed in the chest. The device can be used to give IV medicine, to take blood, or for dialysis. You may have an implanted port if:  You need IV medicine that would be irritating to the small veins in your hands or arms.  You need IV medicines, such as antibiotics, for a long period of time.  You need IV nutrition for a long period of time.  You need dialysis. Having a port means that your health care provider will not need to use the veins in your arms for these procedures. You may have fewer limitations when using a port than you would if you used other types of long-term IVs, and you will likely be able to return to normal activities after your incision heals. An implanted port has two main parts:  Reservoir. The reservoir is the part where a needle is inserted to give medicines or draw blood. The reservoir is round. After it is placed, it appears as a small, raised area under your skin.  Catheter. The catheter is a thin,  flexible tube that connects the reservoir to a vein. Medicine that is inserted into the reservoir goes into the catheter and then into the vein. How is my port accessed? To access your port:  A numbing cream may be placed on the skin over the port site.  Your health care provider will put on a mask and sterile gloves.  The skin over your port will be cleaned carefully with a germ-killing soap and allowed to dry.  Your health care provider will gently pinch the port and insert a needle into it.  Your health care provider will check for a blood return to make sure the port is in the vein and is not clogged.  If your port needs to remain accessed to get medicine continuously (constant infusion), your health care provider will place a clear bandage (dressing) over the needle site. The dressing and needle will need to be changed every week, or as told by your health care provider. What is flushing? Flushing helps keep the port from getting clogged. Follow instructions from your health care provider about how and when to flush the port. Ports are usually flushed with saline solution or a medicine   called heparin. The need for flushing will depend on how the port is used:  If the port is only used from time to time to give medicines or draw blood, the port may need to be flushed: ? Before and after medicines have been given. ? Before and after blood has been drawn. ? As part of routine maintenance. Flushing may be recommended every 4-6 weeks.  If a constant infusion is running, the port may not need to be flushed.  Throw away any syringes in a disposal container that is meant for sharp items (sharps container). You can buy a sharps container from a pharmacy, or you can make one by using an empty hard plastic bottle with a cover. How long will my port stay implanted? The port can stay in for as long as your health care provider thinks it is needed. When it is time for the port to come out, a  surgery will be done to remove it. The surgery will be similar to the procedure that was done to put the port in. Follow these instructions at home:   Flush your port as told by your health care provider.  If you need an infusion over several days, follow instructions from your health care provider about how to take care of your port site. Make sure you: ? Wash your hands with soap and water before you change your dressing. If soap and water are not available, use alcohol-based hand sanitizer. ? Change your dressing as told by your health care provider. ? Place any used dressings or infusion bags into a plastic bag. Throw that bag in the trash. ? Keep the dressing that covers the needle clean and dry. Do not get it wet. ? Do not use scissors or sharp objects near the tube. ? Keep the tube clamped, unless it is being used.  Check your port site every day for signs of infection. Check for: ? Redness, swelling, or pain. ? Fluid or blood. ? Pus or a bad smell.  Protect the skin around the port site. ? Avoid wearing bra straps that rub or irritate the site. ? Protect the skin around your port from seat belts. Place a soft pad over your chest if needed.  Bathe or shower as told by your health care provider. The site may get wet as long as you are not actively receiving an infusion.  Return to your normal activities as told by your health care provider. Ask your health care provider what activities are safe for you.  Carry a medical alert card or wear a medical alert bracelet at all times. This will let health care providers know that you have an implanted port in case of an emergency. Get help right away if:  You have redness, swelling, or pain at the port site.  You have fluid or blood coming from your port site.  You have pus or a bad smell coming from the port site.  You have a fever. Summary  Implanted ports are usually placed in the chest for long-term IV access.  Follow  instructions from your health care provider about flushing the port and changing bandages (dressings).  Take care of the area around your port by avoiding clothing that puts pressure on the area, and by watching for signs of infection.  Protect the skin around your port from seat belts. Place a soft pad over your chest if needed.  Get help right away if you have a fever or you have redness,   swelling, pain, drainage, or a bad smell at the port site. This information is not intended to replace advice given to you by your health care provider. Make sure you discuss any questions you have with your health care provider. Document Revised: 02/27/2019 Document Reviewed: 12/08/2016 Elsevier Patient Education  2020 Elsevier Inc.  

## 2020-10-30 ENCOUNTER — Other Ambulatory Visit: Payer: Self-pay | Admitting: Oncology

## 2020-11-01 ENCOUNTER — Encounter: Payer: Self-pay | Admitting: Nurse Practitioner

## 2020-11-01 ENCOUNTER — Inpatient Hospital Stay: Payer: Medicare Other

## 2020-11-01 ENCOUNTER — Other Ambulatory Visit: Payer: Self-pay

## 2020-11-01 ENCOUNTER — Inpatient Hospital Stay (HOSPITAL_BASED_OUTPATIENT_CLINIC_OR_DEPARTMENT_OTHER): Payer: Medicare Other | Admitting: Nurse Practitioner

## 2020-11-01 VITALS — BP 111/69 | HR 94 | Temp 98.5°F | Resp 16 | Ht 66.0 in | Wt 127.2 lb

## 2020-11-01 DIAGNOSIS — C185 Malignant neoplasm of splenic flexure: Secondary | ICD-10-CM

## 2020-11-01 DIAGNOSIS — Z95828 Presence of other vascular implants and grafts: Secondary | ICD-10-CM

## 2020-11-01 LAB — CBC WITH DIFFERENTIAL (CANCER CENTER ONLY)
Abs Immature Granulocytes: 0.08 10*3/uL — ABNORMAL HIGH (ref 0.00–0.07)
Basophils Absolute: 0 10*3/uL (ref 0.0–0.1)
Basophils Relative: 0 %
Eosinophils Absolute: 0.1 10*3/uL (ref 0.0–0.5)
Eosinophils Relative: 1 %
HCT: 34 % — ABNORMAL LOW (ref 36.0–46.0)
Hemoglobin: 11 g/dL — ABNORMAL LOW (ref 12.0–15.0)
Immature Granulocytes: 1 %
Lymphocytes Relative: 10 %
Lymphs Abs: 1.1 10*3/uL (ref 0.7–4.0)
MCH: 34.2 pg — ABNORMAL HIGH (ref 26.0–34.0)
MCHC: 32.4 g/dL (ref 30.0–36.0)
MCV: 105.6 fL — ABNORMAL HIGH (ref 80.0–100.0)
Monocytes Absolute: 0.7 10*3/uL (ref 0.1–1.0)
Monocytes Relative: 6 %
Neutro Abs: 8.4 10*3/uL — ABNORMAL HIGH (ref 1.7–7.7)
Neutrophils Relative %: 82 %
Platelet Count: 194 10*3/uL (ref 150–400)
RBC: 3.22 MIL/uL — ABNORMAL LOW (ref 3.87–5.11)
RDW: 18.2 % — ABNORMAL HIGH (ref 11.5–15.5)
WBC Count: 10.4 10*3/uL (ref 4.0–10.5)
nRBC: 0 % (ref 0.0–0.2)

## 2020-11-01 LAB — CMP (CANCER CENTER ONLY)
ALT: 46 U/L — ABNORMAL HIGH (ref 0–44)
AST: 35 U/L (ref 15–41)
Albumin: 3.1 g/dL — ABNORMAL LOW (ref 3.5–5.0)
Alkaline Phosphatase: 154 U/L — ABNORMAL HIGH (ref 38–126)
Anion gap: 6 (ref 5–15)
BUN: 15 mg/dL (ref 8–23)
CO2: 28 mmol/L (ref 22–32)
Calcium: 9.3 mg/dL (ref 8.9–10.3)
Chloride: 107 mmol/L (ref 98–111)
Creatinine: 0.74 mg/dL (ref 0.44–1.00)
GFR, Estimated: >60 mL/min (ref >60)
Glucose, Bld: 104 mg/dL — ABNORMAL HIGH (ref 70–99)
Potassium: 4 mmol/L (ref 3.5–5.1)
Sodium: 141 mmol/L (ref 135–145)
Total Bilirubin: 0.3 mg/dL (ref 0.3–1.2)
Total Protein: 6.7 g/dL (ref 6.5–8.1)

## 2020-11-01 LAB — MAGNESIUM: Magnesium: 1.8 mg/dL (ref 1.7–2.4)

## 2020-11-01 MED ORDER — SODIUM CHLORIDE 0.9 % IV SOLN
150.0000 mg/m2 | Freq: Once | INTRAVENOUS | Status: AC
  Administered 2020-11-01: 260 mg via INTRAVENOUS
  Filled 2020-11-01: qty 13

## 2020-11-01 MED ORDER — PALONOSETRON HCL INJECTION 0.25 MG/5ML
0.2500 mg | Freq: Once | INTRAVENOUS | Status: AC
  Administered 2020-11-01: 0.25 mg via INTRAVENOUS

## 2020-11-01 MED ORDER — LEUCOVORIN CALCIUM INJECTION 350 MG
400.0000 mg/m2 | Freq: Once | INTRAVENOUS | Status: AC
  Administered 2020-11-01: 676 mg via INTRAVENOUS
  Filled 2020-11-01: qty 33.8

## 2020-11-01 MED ORDER — SODIUM CHLORIDE 0.9 % IV SOLN
Freq: Once | INTRAVENOUS | Status: AC
  Filled 2020-11-01: qty 250

## 2020-11-01 MED ORDER — DEXTROSE 5 % IV SOLN
Freq: Once | INTRAVENOUS | Status: AC
  Filled 2020-11-01: qty 250

## 2020-11-01 MED ORDER — SODIUM CHLORIDE 0.9 % IV SOLN
10.0000 mg | Freq: Once | INTRAVENOUS | Status: AC
  Administered 2020-11-01: 10 mg via INTRAVENOUS
  Filled 2020-11-01: qty 10

## 2020-11-01 MED ORDER — OXALIPLATIN CHEMO INJECTION 100 MG/20ML
85.0000 mg/m2 | Freq: Once | INTRAVENOUS | Status: AC
  Administered 2020-11-01: 145 mg via INTRAVENOUS
  Filled 2020-11-01: qty 10

## 2020-11-01 MED ORDER — SODIUM CHLORIDE 0.9 % IV SOLN
2000.0000 mg/m2 | INTRAVENOUS | Status: AC
  Administered 2020-11-01: 3400 mg via INTRAVENOUS
  Filled 2020-11-01: qty 68

## 2020-11-01 MED ORDER — ATROPINE SULFATE 1 MG/ML IJ SOLN
0.5000 mg | Freq: Once | INTRAMUSCULAR | Status: AC | PRN
  Administered 2020-11-01: 0.5 mg via INTRAVENOUS

## 2020-11-01 MED ORDER — SODIUM CHLORIDE 0.9 % IV SOLN
150.0000 mg | Freq: Once | INTRAVENOUS | Status: AC
  Administered 2020-11-01: 150 mg via INTRAVENOUS
  Filled 2020-11-01: qty 150

## 2020-11-01 MED ORDER — PALONOSETRON HCL INJECTION 0.25 MG/5ML
INTRAVENOUS | Status: AC
  Filled 2020-11-01: qty 5

## 2020-11-01 MED ORDER — SODIUM CHLORIDE 0.9 % IV SOLN
5.0000 mg/kg | Freq: Once | INTRAVENOUS | Status: AC
  Administered 2020-11-01: 300 mg via INTRAVENOUS
  Filled 2020-11-01: qty 12

## 2020-11-01 MED ORDER — SODIUM CHLORIDE 0.9% FLUSH
10.0000 mL | Freq: Once | INTRAVENOUS | Status: AC
  Administered 2020-11-01: 10 mL
  Filled 2020-11-01: qty 10

## 2020-11-01 MED ORDER — ATROPINE SULFATE 1 MG/ML IJ SOLN
INTRAMUSCULAR | Status: AC
  Filled 2020-11-01: qty 1

## 2020-11-01 NOTE — Patient Instructions (Signed)
[facility] Discharge Instructions for Patients Receiving Chemotherapy  Today you received the following chemotherapy agents Bevacizumab-bvzr (ZIRABEV), Oxaliplatin (ELOXATIN), Irinotecan (CAMPTOSAR), Leucovorin & Flourouracil (ADRUCIL).  To help prevent nausea and vomiting after your treatment, we encourage you to take your nausea medication as prescribed.   If you develop nausea and vomiting that is not controlled by your nausea medication, call the clinic.   BELOW ARE SYMPTOMS THAT SHOULD BE REPORTED IMMEDIATELY:  *FEVER GREATER THAN 100.5 F  *CHILLS WITH OR WITHOUT FEVER  NAUSEA AND VOMITING THAT IS NOT CONTROLLED WITH YOUR NAUSEA MEDICATION  *UNUSUAL SHORTNESS OF BREATH  *UNUSUAL BRUISING OR BLEEDING  TENDERNESS IN MOUTH AND THROAT WITH OR WITHOUT PRESENCE OF ULCERS  *URINARY PROBLEMS  *BOWEL PROBLEMS  UNUSUAL RASH Items with * indicate a potential emergency and should be followed up as soon as possible.  Feel free to call the clinic should you have any questions or concerns. The clinic phone number is [phone].  Please show the [facility] at check-in to the Emergency Department and triage nurse.

## 2020-11-01 NOTE — Patient Instructions (Signed)

## 2020-11-01 NOTE — Progress Notes (Signed)
  [facility] OFFICE PROGRESS NOTE   Diagnosis: Colon cancer  INTERVAL HISTORY:   Kathy Robinson returns as scheduled.  She completed a cycle of FOLFOXIRI/Avastin 10/18/2020.  She denies nausea/vomiting.  No mouth sores.  Some diarrhea.  She has mild persistent cold sensitivity.  No numbness or tingling in the absence of cold exposure.  She reports a good appetite.  No bleeding.  No symptoms of thrombosis.  No chest pain or shortness of breath.  Objective:  Vital signs in last 24 hours:  Blood pressure 111/69, pulse 94, temperature 98.5 F (36.9 C), temperature source Tympanic, resp. rate 16, height $RemoveBe'5\' 6"'AOuWkIPyn$  (1.676 m), weight 127 lb 3.2 oz (57.7 kg), last menstrual period 11/19/1996, SpO2 96 %.    HEENT: No thrush or ulcers. Resp: Lungs clear bilaterally. Cardio: Regular rate and rhythm. GI: Abdomen soft and nontender.  No hepatomegaly. Vascular: No leg edema.  Calves soft and nontender. Neuro: Vibratory sense intact over the fingertips per tuning fork exam. Skin: Palms without erythema. Port-A-Cath without erythema.   Lab Results:  Lab Results  Component Value Date   WBC 10.4 11/01/2020   HGB 11.0 (L) 11/01/2020   HCT 34.0 (L) 11/01/2020   MCV 105.6 (H) 11/01/2020   PLT 194 11/01/2020   NEUTROABS 8.4 (H) 11/01/2020    Imaging:  No results found.  Medications: I have reviewed the patient's current medications.  Assessment/Plan: 1. Adenocarcinoma the left colon, stage IIIc (pT4b,pN2a), status post a left colectomy 05/30/2020, MSS, TMB 13, BRAF V600E ? Tumor invades the visceral peritoneum, lymphovascular and perineural invasion present, for tumor deposits, 7/13 lymph nodes, negative resection margins, no loss of mismatch repair protein expression ? CT abdomen/pelvis 05/26/2020-long segment of masslike thickening of the descending colon with associated high-grade colonic obstruction, small colonic wall defect with evidence of a focally contained microperforation,  retroperitoneal adenopathy, indeterminate small hypodense liver lesions ? Elevated preoperative CEA ? PET8/02/2020-hypermetabolic liver metastases, periportal and periaortic adenopathy, hypermetabolic mediastinal and left supraclavicular nodes ? Cycle 1 FOLFOX 07/05/20  ? Cycle 2 FOLFOXIRI (dose-reduced 5FU, no bolus) and Avastin 07/19/20 ? Cycle 3 FOLFOXIRI (Dose reduced 5-FU, no bolus)/Avastin 08/02/2020 ? Cycle 4 FOLFOXIRI/Avastin 08/16/2020 (Udenyca added) ? Cycle 5 FOLFOXIRI/Avastin 08/30/2020 ? CTs 09/09/2020-diminished size of lymph nodes in the chest, abdomen, and pelvis. Decreased hepatic metastases. No new evidence of disease progression, fat density lesion surrounding the left hemicolectomy ? Cycle 6 FOLFOXIRI/Avastin 09/13/2020 ? Cycle 7 FOLFOX/Avastin 09/27/2020 (irinotecan held) ? Cycle 8 FOLFOXIRI/Avastin 10/18/2020  ? Cycle 9 FOLFOXIRI/Avastin 11/01/2020 2. Atrial fibrillation with rapid ventricular response 05/26/2020 3. Mild neutropenia following cycle 3 FOLFOXIRI, Udenyca added with cycle 4 4. Hypokalemia secondary to diarrhea-potassium supplementation starting 09/13/2020 5. 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 appears stable.  She has completed 8 cycles of systemic therapy.  She continues to tolerate treatment well.  Plan to proceed with cycle 9 FOLFOXIRI/Avastin today as scheduled.  Restaging CTs after cycle 10.  We reviewed the CBC from today.  Counts adequate to proceed with treatment.  She will return for lab, follow-up, cycle 10 FOLFOXIRI/Avastin on 11/23/2019.  She will contact the office in the interim with any problems.    Ned Card ANP/GNP-BC   11/01/2020  9:49 AM

## 2020-11-02 ENCOUNTER — Telehealth: Payer: Self-pay | Admitting: Nurse Practitioner

## 2020-11-02 NOTE — Telephone Encounter (Signed)
Scheduled appointments per 12/14 los. Called patient, no answer. Left message for patient with appointments dates and times.

## 2020-11-03 ENCOUNTER — Inpatient Hospital Stay: Payer: Medicare Other

## 2020-11-03 ENCOUNTER — Other Ambulatory Visit: Payer: Self-pay

## 2020-11-03 VITALS — BP 136/74 | HR 82 | Temp 98.5°F | Resp 18

## 2020-11-03 DIAGNOSIS — C185 Malignant neoplasm of splenic flexure: Secondary | ICD-10-CM | POA: Diagnosis not present

## 2020-11-03 MED ORDER — PEGFILGRASTIM-CBQV 6 MG/0.6ML ~~LOC~~ SOSY
6.0000 mg | PREFILLED_SYRINGE | Freq: Once | SUBCUTANEOUS | Status: AC
Start: 2020-11-03 — End: 2020-11-03
  Administered 2020-11-03: 6 mg via SUBCUTANEOUS

## 2020-11-03 MED ORDER — HEPARIN SOD (PORK) LOCK FLUSH 100 UNIT/ML IV SOLN
500.0000 [IU] | Freq: Once | INTRAVENOUS | Status: AC | PRN
  Administered 2020-11-03: 500 [IU]
  Filled 2020-11-03: qty 5

## 2020-11-03 MED ORDER — SODIUM CHLORIDE 0.9% FLUSH
10.0000 mL | INTRAVENOUS | Status: DC | PRN
  Administered 2020-11-03: 10 mL
  Filled 2020-11-03: qty 10

## 2020-11-03 NOTE — Patient Instructions (Signed)

## 2020-11-20 ENCOUNTER — Other Ambulatory Visit: Payer: Self-pay | Admitting: Oncology

## 2020-11-22 ENCOUNTER — Inpatient Hospital Stay: Payer: Medicare Other | Attending: Oncology

## 2020-11-22 ENCOUNTER — Inpatient Hospital Stay (HOSPITAL_BASED_OUTPATIENT_CLINIC_OR_DEPARTMENT_OTHER): Payer: Medicare Other | Admitting: Oncology

## 2020-11-22 ENCOUNTER — Other Ambulatory Visit: Payer: Self-pay | Admitting: Oncology

## 2020-11-22 ENCOUNTER — Inpatient Hospital Stay: Payer: Medicare Other

## 2020-11-22 ENCOUNTER — Other Ambulatory Visit: Payer: Self-pay

## 2020-11-22 VITALS — BP 139/77 | HR 101 | Temp 98.6°F | Resp 19 | Ht 66.0 in | Wt 129.1 lb

## 2020-11-22 VITALS — BP 122/54

## 2020-11-22 DIAGNOSIS — Z95828 Presence of other vascular implants and grafts: Secondary | ICD-10-CM

## 2020-11-22 DIAGNOSIS — C185 Malignant neoplasm of splenic flexure: Secondary | ICD-10-CM

## 2020-11-22 DIAGNOSIS — G620 Drug-induced polyneuropathy: Secondary | ICD-10-CM | POA: Insufficient documentation

## 2020-11-22 DIAGNOSIS — Z5111 Encounter for antineoplastic chemotherapy: Secondary | ICD-10-CM | POA: Insufficient documentation

## 2020-11-22 DIAGNOSIS — I4891 Unspecified atrial fibrillation: Secondary | ICD-10-CM | POA: Diagnosis not present

## 2020-11-22 DIAGNOSIS — Z5189 Encounter for other specified aftercare: Secondary | ICD-10-CM | POA: Insufficient documentation

## 2020-11-22 DIAGNOSIS — E876 Hypokalemia: Secondary | ICD-10-CM | POA: Insufficient documentation

## 2020-11-22 DIAGNOSIS — T451X5A Adverse effect of antineoplastic and immunosuppressive drugs, initial encounter: Secondary | ICD-10-CM | POA: Diagnosis not present

## 2020-11-22 DIAGNOSIS — D709 Neutropenia, unspecified: Secondary | ICD-10-CM | POA: Diagnosis not present

## 2020-11-22 LAB — CBC WITH DIFFERENTIAL (CANCER CENTER ONLY)
Abs Immature Granulocytes: 0.05 10*3/uL (ref 0.00–0.07)
Basophils Absolute: 0.1 10*3/uL (ref 0.0–0.1)
Basophils Relative: 1 %
Eosinophils Absolute: 0.1 10*3/uL (ref 0.0–0.5)
Eosinophils Relative: 2 %
HCT: 34 % — ABNORMAL LOW (ref 36.0–46.0)
Hemoglobin: 11.2 g/dL — ABNORMAL LOW (ref 12.0–15.0)
Immature Granulocytes: 1 %
Lymphocytes Relative: 20 %
Lymphs Abs: 1.5 10*3/uL (ref 0.7–4.0)
MCH: 35.1 pg — ABNORMAL HIGH (ref 26.0–34.0)
MCHC: 32.9 g/dL (ref 30.0–36.0)
MCV: 106.6 fL — ABNORMAL HIGH (ref 80.0–100.0)
Monocytes Absolute: 0.9 10*3/uL (ref 0.1–1.0)
Monocytes Relative: 12 %
Neutro Abs: 4.9 10*3/uL (ref 1.7–7.7)
Neutrophils Relative %: 64 %
Platelet Count: 233 10*3/uL (ref 150–400)
RBC: 3.19 MIL/uL — ABNORMAL LOW (ref 3.87–5.11)
RDW: 16.7 % — ABNORMAL HIGH (ref 11.5–15.5)
WBC Count: 7.5 10*3/uL (ref 4.0–10.5)
nRBC: 0 % (ref 0.0–0.2)

## 2020-11-22 LAB — CMP (CANCER CENTER ONLY)
ALT: 72 U/L — ABNORMAL HIGH (ref 0–44)
AST: 67 U/L — ABNORMAL HIGH (ref 15–41)
Albumin: 3.3 g/dL — ABNORMAL LOW (ref 3.5–5.0)
Alkaline Phosphatase: 145 U/L — ABNORMAL HIGH (ref 38–126)
Anion gap: 6 (ref 5–15)
BUN: 17 mg/dL (ref 8–23)
CO2: 26 mmol/L (ref 22–32)
Calcium: 9.1 mg/dL (ref 8.9–10.3)
Chloride: 106 mmol/L (ref 98–111)
Creatinine: 0.8 mg/dL (ref 0.44–1.00)
GFR, Estimated: >60 mL/min (ref >60)
Glucose, Bld: 88 mg/dL (ref 70–99)
Potassium: 4.2 mmol/L (ref 3.5–5.1)
Sodium: 138 mmol/L (ref 135–145)
Total Bilirubin: 0.3 mg/dL (ref 0.3–1.2)
Total Protein: 6.9 g/dL (ref 6.5–8.1)

## 2020-11-22 LAB — TOTAL PROTEIN, URINE DIPSTICK: Protein, ur: <30 mg/dL — AB

## 2020-11-22 LAB — MAGNESIUM: Magnesium: 1.8 mg/dL (ref 1.7–2.4)

## 2020-11-22 MED ORDER — SODIUM CHLORIDE 0.9 % IV SOLN
1600.0000 mg/m2 | INTRAVENOUS | Status: DC
  Administered 2020-11-22: 2650 mg via INTRAVENOUS
  Filled 2020-11-22: qty 53

## 2020-11-22 MED ORDER — SODIUM CHLORIDE 0.9% FLUSH
10.0000 mL | Freq: Once | INTRAVENOUS | Status: AC
Start: 2020-11-22 — End: 2020-11-22
  Administered 2020-11-22: 10 mL
  Filled 2020-11-22: qty 10

## 2020-11-22 MED ORDER — LEUCOVORIN CALCIUM INJECTION 350 MG
400.0000 mg/m2 | Freq: Once | INTRAMUSCULAR | Status: AC
  Administered 2020-11-22: 660 mg via INTRAVENOUS
  Filled 2020-11-22: qty 33

## 2020-11-22 MED ORDER — PALONOSETRON HCL INJECTION 0.25 MG/5ML
INTRAVENOUS | Status: AC
  Filled 2020-11-22: qty 5

## 2020-11-22 MED ORDER — POTASSIUM CHLORIDE ER 10 MEQ PO CPCR: 10.0000 meq | ORAL_CAPSULE | Freq: Two times a day (BID) | ORAL | 1 refills | Status: DC

## 2020-11-22 MED ORDER — SODIUM CHLORIDE 0.9 % IV SOLN
5.0000 mg/kg | Freq: Once | INTRAVENOUS | Status: AC
  Administered 2020-11-22: 300 mg via INTRAVENOUS
  Filled 2020-11-22: qty 12

## 2020-11-22 MED ORDER — ATROPINE SULFATE 1 MG/ML IJ SOLN
0.5000 mg | Freq: Once | INTRAMUSCULAR | Status: AC | PRN
  Administered 2020-11-22: 0.5 mg via INTRAVENOUS

## 2020-11-22 MED ORDER — SODIUM CHLORIDE 0.9 % IV SOLN
150.0000 mg | Freq: Once | INTRAVENOUS | Status: AC
Start: 2020-11-22 — End: 2020-11-22
  Administered 2020-11-22: 150 mg via INTRAVENOUS
  Filled 2020-11-22: qty 150

## 2020-11-22 MED ORDER — ATROPINE SULFATE 1 MG/ML IJ SOLN
INTRAMUSCULAR | Status: AC
  Filled 2020-11-22: qty 1

## 2020-11-22 MED ORDER — SODIUM CHLORIDE 0.9 % IV SOLN
Freq: Once | INTRAVENOUS | Status: AC
  Filled 2020-11-22: qty 250

## 2020-11-22 MED ORDER — PALONOSETRON HCL INJECTION 0.25 MG/5ML
0.2500 mg | Freq: Once | INTRAVENOUS | Status: AC
  Administered 2020-11-22: 0.25 mg via INTRAVENOUS

## 2020-11-22 MED ORDER — SODIUM CHLORIDE 0.9 % IV SOLN
100.0000 mg/m2 | Freq: Once | INTRAVENOUS | Status: AC
  Administered 2020-11-22: 160 mg via INTRAVENOUS
  Filled 2020-11-22: qty 8

## 2020-11-22 MED ORDER — SODIUM CHLORIDE 0.9 % IV SOLN
10.0000 mg | Freq: Once | INTRAVENOUS | Status: AC
  Administered 2020-11-22: 10 mg via INTRAVENOUS
  Filled 2020-11-22: qty 10

## 2020-11-22 MED FILL — POTASSIUM CL ER 10 MEQ CAP: 10 | 30 days supply | Qty: 60 | Fill #0

## 2020-11-22 NOTE — Patient Instructions (Signed)
[facility] Discharge Instructions for Patients Receiving Chemotherapy  Today you received the following chemotherapy agents Bevacizumab, Irinotecan, Leucovorin, and 5FU  To help prevent nausea and vomiting after your treatment, we encourage you to take your nausea medication as directed   If you develop nausea and vomiting that is not controlled by your nausea medication, call the clinic.   BELOW ARE SYMPTOMS THAT SHOULD BE REPORTED IMMEDIATELY:  *FEVER GREATER THAN 100.5 F  *CHILLS WITH OR WITHOUT FEVER  NAUSEA AND VOMITING THAT IS NOT CONTROLLED WITH YOUR NAUSEA MEDICATION  *UNUSUAL SHORTNESS OF BREATH  *UNUSUAL BRUISING OR BLEEDING  TENDERNESS IN MOUTH AND THROAT WITH OR WITHOUT PRESENCE OF ULCERS  *URINARY PROBLEMS  *BOWEL PROBLEMS  UNUSUAL RASH Items with * indicate a potential emergency and should be followed up as soon as possible.  Feel free to call the clinic should you have any questions or concerns. The clinic phone number is [phone].  Please show the CHEMO ALERT CARD at check-in to the Emergency Department and triage nurse.   

## 2020-11-22 NOTE — Addendum Note (Signed)
Addended by: Wandalee Ferdinand on: 11/22/2020 10:52 AM   Modules accepted: Orders

## 2020-11-22 NOTE — Progress Notes (Signed)
[place] OFFICE PROGRESS NOTE   Diagnosis: Colon cancer  INTERVAL HISTORY:   Kathy Robinson completed another cycle of FOLFIRINOX/bevacizumab on 11/01/2020.  No nausea/vomiting or mouth sores following chemotherapy.  She had prolonged cold sensitivity and continues to have mild peripheral numbness.  This does not interfere with activities.  She developed diarrhea beginning on day 3.  She had multiple loose stools per day, and she took Imodium.  She did not take Lomotil.  The diarrhea resolved on 11/15/2020.  No diarrhea at present. No bleeding or symptom of thrombosis.  Objective:  Vital signs in last 24 hours:  Blood pressure 139/77, pulse (!) 101, temperature 98.6 F (37 C), temperature source Tympanic, resp. rate 19, height $RemoveBe'5\' 6"'EUSgguSeM$  (1.676 m), weight 129 lb 1.6 oz (58.6 kg), last menstrual period 11/19/1996, SpO2 100 %.    HEENT: No thrush or ulcers Resp: Lungs clear bilaterally Cardio: Regular rate and rhythm GI: No hepatosplenomegaly, nontender Vascular: No leg edema Neuro: Moderate loss of vibratory sense at the fingertips bilaterally  Portacath/PICC-without erythema  Lab Results:  Lab Results  Component Value Date   WBC 7.5 11/22/2020   HGB 11.2 (L) 11/22/2020   HCT 34.0 (L) 11/22/2020   MCV 106.6 (H) 11/22/2020   PLT 233 11/22/2020   NEUTROABS 4.9 11/22/2020    CMP  Lab Results  Component Value Date   NA 138 11/22/2020   K 4.2 11/22/2020   CL 106 11/22/2020   CO2 26 11/22/2020   GLUCOSE 88 11/22/2020   BUN 17 11/22/2020   CREATININE 0.80 11/22/2020   CALCIUM 9.1 11/22/2020   PROT 6.9 11/22/2020   ALBUMIN 3.3 (L) 11/22/2020   AST 67 (H) 11/22/2020   ALT 72 (H) 11/22/2020   ALKPHOS 145 (H) 11/22/2020   BILITOT 0.3 11/22/2020   GFRNONAA >60 11/22/2020   GFRAA >60 08/16/2020    Lab Results  Component Value Date   CEA1 1.19 10/18/2020    Medications: I have reviewed the patient's current  medications.   Assessment/Plan:  1. Adenocarcinoma the left colon, stage IIIc (pT4b,pN2a), status post a left colectomy 05/30/2020, MSS, TMB 13, BRAF V600E ? Tumor invades the visceral peritoneum, lymphovascular and perineural invasion present, for tumor deposits, 7/13 lymph nodes, negative resection margins, no loss of mismatch repair protein expression ? CT abdomen/pelvis 05/26/2020-long segment of masslike thickening of the descending colon with associated high-grade colonic obstruction, small colonic wall defect with evidence of a focally contained microperforation, retroperitoneal adenopathy, indeterminate small hypodense liver lesions ? Elevated preoperative CEA ? PET8/02/2020-hypermetabolic liver metastases, periportal and periaortic adenopathy, hypermetabolic mediastinal and left supraclavicular nodes ? Cycle 1 FOLFOX 07/05/20  ? Cycle 2 FOLFOXIRI (dose-reduced 5FU, no bolus) and Avastin 07/19/20 ? Cycle 3 FOLFOXIRI (Dose reduced 5-FU, no bolus)/Avastin 08/02/2020 ? Cycle 4 FOLFOXIRI/Avastin 08/16/2020 (Udenyca added) ? Cycle 5 FOLFOXIRI/Avastin 08/30/2020 ? CTs 09/09/2020-diminished size of lymph nodes in the chest, abdomen, and pelvis. Decreased hepatic metastases. No new evidence of disease progression, fat density lesion surrounding the left hemicolectomy ? Cycle 6 FOLFOXIRI/Avastin 09/13/2020 ? Cycle 7 FOLFOX/Avastin 09/27/2020 (irinotecan held) ? Cycle 8 FOLFOXIRI/Avastin 10/18/2020  ? Cycle 9 FOLFOXIRI/Avastin 11/01/2020 ? Cycle 10 FOLFOXIRI/Avastin 11/22/2020 (oxaliplatin held, irinotecan and 5-FU dose reduced) 2. Atrial fibrillation with rapid ventricular response 05/26/2020 3. Mild neutropenia following cycle 3 FOLFOXIRI, Udenyca added with cycle 4 4. Hypokalemia secondary to diarrhea-potassium supplementation starting 09/13/2020 5. Oxaliplatin neuropathy-moderate loss of vibratory sense on exam 11/22/2020, oxaliplatin held with cycle 10 chemotherapy    Disposition: [name]  has completed 9 cycles of FOLFOXIRI/bevacizumab.  She had increased diarrhea following the most recent cycle of chemotherapy.  Irinotecan and 5-FU will be dose reduced with this cycle of chemotherapy.  I encouraged her to use Lomotil in addition to Imodium.  She will call if she has diarrhea despite these medications.  She is developing oxaliplatin neuropathy.  Oxaliplatin will be held from the chemotherapy regimen today.  Kathy Robinson will undergo a restaging CT evaluation after this cycle.  She will return for an office visit in 2 weeks.  [name], MD  11/22/2020  10:45 AM

## 2020-11-23 ENCOUNTER — Telehealth: Payer: Self-pay | Admitting: Oncology

## 2020-11-23 NOTE — Telephone Encounter (Signed)
Moved scheduled appointments from 1/18 to 1/17 per 1/4 los. Spoke to patient who is aware of scheduling change.

## 2020-11-24 ENCOUNTER — Inpatient Hospital Stay: Payer: Medicare Other

## 2020-11-24 ENCOUNTER — Other Ambulatory Visit: Payer: Self-pay

## 2020-11-24 VITALS — BP 132/68 | HR 82 | Temp 98.7°F | Resp 18

## 2020-11-24 DIAGNOSIS — E876 Hypokalemia: Secondary | ICD-10-CM | POA: Diagnosis not present

## 2020-11-24 DIAGNOSIS — C185 Malignant neoplasm of splenic flexure: Secondary | ICD-10-CM | POA: Diagnosis not present

## 2020-11-24 DIAGNOSIS — Z5189 Encounter for other specified aftercare: Secondary | ICD-10-CM | POA: Diagnosis not present

## 2020-11-24 DIAGNOSIS — G620 Drug-induced polyneuropathy: Secondary | ICD-10-CM | POA: Diagnosis not present

## 2020-11-24 DIAGNOSIS — Z5111 Encounter for antineoplastic chemotherapy: Secondary | ICD-10-CM | POA: Diagnosis not present

## 2020-11-24 DIAGNOSIS — I4891 Unspecified atrial fibrillation: Secondary | ICD-10-CM | POA: Diagnosis not present

## 2020-11-24 DIAGNOSIS — T451X5A Adverse effect of antineoplastic and immunosuppressive drugs, initial encounter: Secondary | ICD-10-CM | POA: Diagnosis not present

## 2020-11-24 DIAGNOSIS — D709 Neutropenia, unspecified: Secondary | ICD-10-CM | POA: Diagnosis not present

## 2020-11-24 MED ORDER — PEGFILGRASTIM-CBQV 6 MG/0.6ML ~~LOC~~ SOSY
PREFILLED_SYRINGE | SUBCUTANEOUS | Status: AC
  Filled 2020-11-24: qty 0.6

## 2020-11-24 MED ORDER — HEPARIN SOD (PORK) LOCK FLUSH 100 UNIT/ML IV SOLN
500.0000 [IU] | Freq: Once | INTRAVENOUS | Status: AC | PRN
  Administered 2020-11-24: 500 [IU]
  Filled 2020-11-24: qty 5

## 2020-11-24 MED ORDER — PEGFILGRASTIM-CBQV 6 MG/0.6ML ~~LOC~~ SOSY
6.0000 mg | PREFILLED_SYRINGE | Freq: Once | SUBCUTANEOUS | Status: AC
  Administered 2020-11-24: 6 mg via SUBCUTANEOUS

## 2020-11-24 MED ORDER — SODIUM CHLORIDE 0.9% FLUSH
10.0000 mL | INTRAVENOUS | Status: DC | PRN
  Administered 2020-11-24: 10 mL
  Filled 2020-11-24: qty 10

## 2020-12-05 ENCOUNTER — Inpatient Hospital Stay: Payer: Medicare Other

## 2020-12-05 ENCOUNTER — Ambulatory Visit (HOSPITAL_COMMUNITY): Payer: Medicare Other

## 2020-12-05 ENCOUNTER — Telehealth: Payer: Self-pay

## 2020-12-05 ENCOUNTER — Other Ambulatory Visit: Payer: Medicare Other

## 2020-12-05 ENCOUNTER — Other Ambulatory Visit: Payer: Self-pay | Admitting: Oncology

## 2020-12-05 DIAGNOSIS — C185 Malignant neoplasm of splenic flexure: Secondary | ICD-10-CM | POA: Diagnosis not present

## 2020-12-05 NOTE — Telephone Encounter (Signed)
Pt contacted regarding rescheduling CT appointment. Currently unable to get through to [facility] scheduling and phone system is down. Will call PT as soon as I am able to get CT rescheduled.

## 2020-12-06 ENCOUNTER — Inpatient Hospital Stay: Payer: Medicare Other

## 2020-12-06 ENCOUNTER — Other Ambulatory Visit: Payer: Medicare Other

## 2020-12-06 ENCOUNTER — Inpatient Hospital Stay: Payer: Medicare Other | Admitting: Nurse Practitioner

## 2020-12-06 ENCOUNTER — Telehealth: Payer: Self-pay

## 2020-12-06 NOTE — Telephone Encounter (Signed)
Pt contacted to inform her of her rescheduled CT scan appointment. 12/15/20 at 3:30 pm. Pt to drink contrast 1 at 1:30 pm and contrast 2 at 2:30 pm. Pt verbalized understanding. Pt informed [facility] appointments to be rescheduled from 12/12/20 to the first week in Feb. Scheduling to contact pt.

## 2020-12-07 ENCOUNTER — Telehealth: Payer: Self-pay | Admitting: Nurse Practitioner

## 2020-12-07 NOTE — Telephone Encounter (Signed)
Rescheduled appointments due to weather. Called patient, no answer. Left message with appointments dates and times.

## 2020-12-08 ENCOUNTER — Inpatient Hospital Stay: Payer: Medicare Other

## 2020-12-08 ENCOUNTER — Telehealth: Payer: Self-pay

## 2020-12-08 NOTE — Telephone Encounter (Signed)
Returned call to pt regarding next weeks appointments. Pt will reschedule CTf conflicts with her pump stop appointment.

## 2020-12-12 ENCOUNTER — Other Ambulatory Visit: Payer: Self-pay

## 2020-12-12 ENCOUNTER — Encounter: Payer: Self-pay | Admitting: Nurse Practitioner

## 2020-12-12 ENCOUNTER — Inpatient Hospital Stay: Payer: Medicare Other

## 2020-12-12 ENCOUNTER — Inpatient Hospital Stay: Payer: Medicare Other | Admitting: Nurse Practitioner

## 2020-12-12 VITALS — BP 146/73 | HR 96 | Temp 99.3°F | Resp 16 | Ht 66.0 in | Wt 132.3 lb

## 2020-12-12 DIAGNOSIS — C185 Malignant neoplasm of splenic flexure: Secondary | ICD-10-CM

## 2020-12-12 DIAGNOSIS — I4891 Unspecified atrial fibrillation: Secondary | ICD-10-CM | POA: Diagnosis not present

## 2020-12-12 DIAGNOSIS — Z95828 Presence of other vascular implants and grafts: Secondary | ICD-10-CM

## 2020-12-12 DIAGNOSIS — Z5111 Encounter for antineoplastic chemotherapy: Secondary | ICD-10-CM | POA: Diagnosis not present

## 2020-12-12 DIAGNOSIS — Z5189 Encounter for other specified aftercare: Secondary | ICD-10-CM | POA: Diagnosis not present

## 2020-12-12 DIAGNOSIS — G620 Drug-induced polyneuropathy: Secondary | ICD-10-CM | POA: Diagnosis not present

## 2020-12-12 DIAGNOSIS — T451X5A Adverse effect of antineoplastic and immunosuppressive drugs, initial encounter: Secondary | ICD-10-CM | POA: Diagnosis not present

## 2020-12-12 DIAGNOSIS — D709 Neutropenia, unspecified: Secondary | ICD-10-CM | POA: Diagnosis not present

## 2020-12-12 DIAGNOSIS — E876 Hypokalemia: Secondary | ICD-10-CM | POA: Diagnosis not present

## 2020-12-12 LAB — CMP (CANCER CENTER ONLY)
ALT: 108 U/L — ABNORMAL HIGH (ref 0–44)
AST: 67 U/L — ABNORMAL HIGH (ref 15–41)
Albumin: 3.4 g/dL — ABNORMAL LOW (ref 3.5–5.0)
Alkaline Phosphatase: 180 U/L — ABNORMAL HIGH (ref 38–126)
Anion gap: 6 (ref 5–15)
BUN: 17 mg/dL (ref 8–23)
CO2: 25 mmol/L (ref 22–32)
Calcium: 8.9 mg/dL (ref 8.9–10.3)
Chloride: 108 mmol/L (ref 98–111)
Creatinine: 0.77 mg/dL (ref 0.44–1.00)
GFR, Estimated: >60 mL/min (ref >60)
Glucose, Bld: 105 mg/dL — ABNORMAL HIGH (ref 70–99)
Potassium: 4.4 mmol/L (ref 3.5–5.1)
Sodium: 139 mmol/L (ref 135–145)
Total Bilirubin: 0.2 mg/dL — ABNORMAL LOW (ref 0.3–1.2)
Total Protein: 6.9 g/dL (ref 6.5–8.1)

## 2020-12-12 LAB — CBC WITH DIFFERENTIAL (CANCER CENTER ONLY)
Abs Immature Granulocytes: 0.02 10*3/uL (ref 0.00–0.07)
Basophils Absolute: 0.1 10*3/uL (ref 0.0–0.1)
Basophils Relative: 1 %
Eosinophils Absolute: 0.2 10*3/uL (ref 0.0–0.5)
Eosinophils Relative: 2 %
HCT: 36.2 % (ref 36.0–46.0)
Hemoglobin: 11.7 g/dL — ABNORMAL LOW (ref 12.0–15.0)
Immature Granulocytes: 0 %
Lymphocytes Relative: 19 %
Lymphs Abs: 1.8 10*3/uL (ref 0.7–4.0)
MCH: 35.5 pg — ABNORMAL HIGH (ref 26.0–34.0)
MCHC: 32.3 g/dL (ref 30.0–36.0)
MCV: 109.7 fL — ABNORMAL HIGH (ref 80.0–100.0)
Monocytes Absolute: 0.8 10*3/uL (ref 0.1–1.0)
Monocytes Relative: 9 %
Neutro Abs: 6.6 10*3/uL (ref 1.7–7.7)
Neutrophils Relative %: 69 %
Platelet Count: 220 10*3/uL (ref 150–400)
RBC: 3.3 MIL/uL — ABNORMAL LOW (ref 3.87–5.11)
RDW: 15.9 % — ABNORMAL HIGH (ref 11.5–15.5)
WBC Count: 9.4 10*3/uL (ref 4.0–10.5)
nRBC: 0 % (ref 0.0–0.2)

## 2020-12-12 LAB — MAGNESIUM: Magnesium: 2 mg/dL (ref 1.7–2.4)

## 2020-12-12 MED ORDER — HEPARIN SOD (PORK) LOCK FLUSH 100 UNIT/ML IV SOLN
500.0000 [IU] | Freq: Once | INTRAVENOUS | Status: AC
  Administered 2020-12-12: 500 [IU]
  Filled 2020-12-12: qty 5

## 2020-12-12 MED ORDER — SODIUM CHLORIDE 0.9% FLUSH
10.0000 mL | Freq: Once | INTRAVENOUS | Status: AC
  Administered 2020-12-12: 10 mL
  Filled 2020-12-12: qty 10

## 2020-12-12 NOTE — Progress Notes (Signed)
Patient requested to be left accessed for tomorrows infusion. biopatch and take home dressing applied. Patient also instructed not to get dressing wet. Patient verbalized understanding

## 2020-12-12 NOTE — Progress Notes (Signed)
[place] OFFICE PROGRESS NOTE   Diagnosis: Colon cancer  INTERVAL HISTORY:   Kathy Robinson returns for follow-up.  She completed another cycle of FOLFOXIRI\bevacizumab 11/22/2020.  Oxaliplatin was held and irinotecan/5-FU dose reduced.  She had to cancel CT and follow-up appointments 12/05/2020 and 12/06/2020 due to the weather.  Overall she is feeling well.  No nausea or vomiting.  No diarrhea.  She has persistent numbness in the hands and feet.  Objective:  Vital signs in last 24 hours:  Blood pressure (!) 146/73, pulse 96, temperature 99.3 F (37.4 C), temperature source Tympanic, resp. rate 16, height $RemoveBe'5\' 6"'DjVgjThXQ$  (1.676 m), weight 132 lb 4.8 oz (60 kg), last menstrual period 11/19/1996, SpO2 100 %.    HEENT: No thrush or ulcers. Resp: Lungs clear bilaterally. Cardio: Regular rate and rhythm. GI: No hepatosplenomegaly. Vascular: No leg edema. Skin: Palms without erythema. Port-A-Cath without erythema.   Lab Results:  Lab Results  Component Value Date   WBC 9.4 12/12/2020   HGB 11.7 (L) 12/12/2020   HCT 36.2 12/12/2020   MCV 109.7 (H) 12/12/2020   PLT 220 12/12/2020   NEUTROABS 6.6 12/12/2020    Imaging:  No results found.  Medications: I have reviewed the patient's current medications.  Assessment/Plan: 1. Adenocarcinoma the left colon, stage IIIc (pT4b,pN2a), status post a left colectomy 05/30/2020, MSS, TMB 13, BRAF V600E ? Tumor invades the visceral peritoneum, lymphovascular and perineural invasion present, for tumor deposits, 7/13 lymph nodes, negative resection margins, no loss of mismatch repair protein expression ? CT abdomen/pelvis 05/26/2020-long segment of masslike thickening of the descending colon with associated high-grade colonic obstruction, small colonic wall defect with evidence of a focally contained microperforation, retroperitoneal adenopathy, indeterminate small hypodense liver lesions ? Elevated preoperative  CEA ? PET8/02/2020-hypermetabolic liver metastases, periportal and periaortic adenopathy, hypermetabolic mediastinal and left supraclavicular nodes ? Cycle 1 FOLFOX 07/05/20  ? Cycle 2 FOLFOXIRI (dose-reduced 5FU, no bolus) and Avastin 07/19/20 ? Cycle 3 FOLFOXIRI (Dose reduced 5-FU, no bolus)/Avastin 08/02/2020 ? Cycle 4 FOLFOXIRI/Avastin 08/16/2020 (Udenyca added) ? Cycle 5 FOLFOXIRI/Avastin 08/30/2020 ? CTs 09/09/2020-diminished size of lymph nodes in the chest, abdomen, and pelvis. Decreased hepatic metastases. No new evidence of disease progression, fat density lesion surrounding the left hemicolectomy ? Cycle 6 FOLFOXIRI/Avastin 09/13/2020 ? Cycle 7 FOLFOX/Avastin 09/27/2020 (irinotecan held) ? Cycle 8 FOLFOXIRI/Avastin 10/18/2020  ? Cycle 9 FOLFOXIRI/Avastin 11/01/2020 ? Cycle 10 FOLFOXIRI/Avastin 11/22/2020 (oxaliplatin held, irinotecan and 5-FU dose reduced) ? Cycle 11 FOLFOXIRI/Avastin 12/12/2020 (oxaliplatin held) 2. Atrial fibrillation with rapid ventricular response 05/26/2020 3. Mild neutropenia following cycle 3 FOLFOXIRI, Udenyca added with cycle 4 4. Hypokalemia secondary to diarrhea-potassium supplementation starting 09/13/2020 5. Oxaliplatin neuropathy-moderate loss of vibratory sense on exam 11/22/2020, oxaliplatin held with cycle 10, cycle 11 chemotherapy   Disposition: Ms. Vanderslice appears stable.  She has completed 10 cycles of FOLFOXIRI/Avastin.  Oxaliplatin was held and irinotecan/5-FU dose reduced with cycle 10.  She has persistent neuropathy symptoms.  Plan to proceed with cycle 11 as scheduled 12/12/2020, hold oxaliplatin.  Adjusting treatment schedule to every 3 weeks going forward.  Restaging CTs scheduled 12/19/2020.  We reviewed the CBC and chemistry panel from today.  Labs are adequate to proceed as above.  Transaminases mildly elevated.  We will continue to monitor.  She will return for lab, follow-up, FOLFOXIRI/Avastin in 3 weeks.  She will contact the office in the  interim with any problems.  Plan reviewed with Dr. Benay Spice.    Ned Card ANP/GNP-BC   12/12/2020  3:41 PM

## 2020-12-12 NOTE — Addendum Note (Signed)
Addended by: Owens Shark on: 12/12/2020 04:34 PM   Modules accepted: Orders

## 2020-12-13 ENCOUNTER — Inpatient Hospital Stay: Payer: Medicare Other

## 2020-12-13 ENCOUNTER — Other Ambulatory Visit: Payer: Self-pay

## 2020-12-13 ENCOUNTER — Telehealth: Payer: Self-pay | Admitting: Nurse Practitioner

## 2020-12-13 VITALS — BP 110/70 | HR 94 | Temp 98.4°F | Resp 18 | Wt 132.4 lb

## 2020-12-13 DIAGNOSIS — D709 Neutropenia, unspecified: Secondary | ICD-10-CM | POA: Diagnosis not present

## 2020-12-13 DIAGNOSIS — G620 Drug-induced polyneuropathy: Secondary | ICD-10-CM | POA: Diagnosis not present

## 2020-12-13 DIAGNOSIS — C185 Malignant neoplasm of splenic flexure: Secondary | ICD-10-CM

## 2020-12-13 DIAGNOSIS — E876 Hypokalemia: Secondary | ICD-10-CM | POA: Diagnosis not present

## 2020-12-13 DIAGNOSIS — I4891 Unspecified atrial fibrillation: Secondary | ICD-10-CM | POA: Diagnosis not present

## 2020-12-13 DIAGNOSIS — Z5111 Encounter for antineoplastic chemotherapy: Secondary | ICD-10-CM | POA: Diagnosis not present

## 2020-12-13 DIAGNOSIS — Z5189 Encounter for other specified aftercare: Secondary | ICD-10-CM | POA: Diagnosis not present

## 2020-12-13 DIAGNOSIS — T451X5A Adverse effect of antineoplastic and immunosuppressive drugs, initial encounter: Secondary | ICD-10-CM | POA: Diagnosis not present

## 2020-12-13 LAB — CEA (IN HOUSE-CHCC): CEA (CHCC-In House): 1.19 ng/mL (ref 0.00–5.00)

## 2020-12-13 MED ORDER — SODIUM CHLORIDE 0.9 % IV SOLN
150.0000 mg | Freq: Once | INTRAVENOUS | Status: AC
  Administered 2020-12-13: 150 mg via INTRAVENOUS
  Filled 2020-12-13: qty 5
  Filled 2020-12-13: qty 150

## 2020-12-13 MED ORDER — SODIUM CHLORIDE 0.9 % IV SOLN
400.0000 mg/m2 | Freq: Once | INTRAVENOUS | Status: AC
  Administered 2020-12-13: 660 mg via INTRAVENOUS
  Filled 2020-12-13: qty 33

## 2020-12-13 MED ORDER — ATROPINE SULFATE 1 MG/ML IJ SOLN
0.5000 mg | Freq: Once | INTRAMUSCULAR | Status: AC
  Administered 2020-12-13: 0.5 mg via INTRAVENOUS

## 2020-12-13 MED ORDER — SODIUM CHLORIDE 0.9 % IV SOLN
Freq: Once | INTRAVENOUS | Status: AC
  Filled 2020-12-13: qty 250

## 2020-12-13 MED ORDER — SODIUM CHLORIDE 0.9 % IV SOLN
10.0000 mg | Freq: Once | INTRAVENOUS | Status: AC
  Administered 2020-12-13: 10 mg via INTRAVENOUS
  Filled 2020-12-13: qty 1
  Filled 2020-12-13: qty 10

## 2020-12-13 MED ORDER — SODIUM CHLORIDE 0.9 % IV SOLN
100.0000 mg/m2 | Freq: Once | INTRAVENOUS | Status: AC
  Administered 2020-12-13: 160 mg via INTRAVENOUS
  Filled 2020-12-13: qty 8

## 2020-12-13 MED ORDER — ATROPINE SULFATE 1 MG/ML IJ SOLN
INTRAMUSCULAR | Status: AC
  Filled 2020-12-13: qty 1

## 2020-12-13 MED ORDER — SODIUM CHLORIDE 0.9 % IV SOLN
5.0000 mg/kg | Freq: Once | INTRAVENOUS | Status: AC
  Administered 2020-12-13: 300 mg via INTRAVENOUS
  Filled 2020-12-13: qty 12

## 2020-12-13 MED ORDER — DEXTROSE 5 % IV SOLN
Freq: Once | INTRAVENOUS | Status: DC
  Filled 2020-12-13: qty 250

## 2020-12-13 MED ORDER — SODIUM CHLORIDE 0.9% FLUSH
10.0000 mL | INTRAVENOUS | Status: DC | PRN
Start: 2020-12-13 — End: 2020-12-13
  Filled 2020-12-13: qty 10

## 2020-12-13 MED ORDER — PALONOSETRON HCL INJECTION 0.25 MG/5ML
INTRAVENOUS | Status: AC
  Filled 2020-12-13: qty 5

## 2020-12-13 MED ORDER — HEPARIN SOD (PORK) LOCK FLUSH 100 UNIT/ML IV SOLN
500.0000 [IU] | Freq: Once | INTRAVENOUS | Status: DC | PRN
  Filled 2020-12-13: qty 5

## 2020-12-13 MED ORDER — SODIUM CHLORIDE 0.9 % IV SOLN
1600.0000 mg/m2 | INTRAVENOUS | Status: DC
  Administered 2020-12-13: 2650 mg via INTRAVENOUS
  Filled 2020-12-13: qty 53

## 2020-12-13 MED ORDER — PALONOSETRON HCL INJECTION 0.25 MG/5ML
0.2500 mg | Freq: Once | INTRAVENOUS | Status: AC
  Administered 2020-12-13: 0.25 mg via INTRAVENOUS

## 2020-12-13 NOTE — Telephone Encounter (Signed)
Scheduled appointments per 1/24 los. Spoke to patient who is aware of appointments dates and times.  

## 2020-12-13 NOTE — Patient Instructions (Signed)
[facility] Discharge Instructions for Patients Receiving Chemotherapy  Today you received the following chemotherapy agents Bevacizumab, irinotecan, leukovorin.  To help prevent nausea and vomiting after your treatment, we encourage you to take your nausea medication as directed. If you develop nausea and vomiting that is not controlled by your nausea medication, call the clinic.   BELOW ARE SYMPTOMS THAT SHOULD BE REPORTED IMMEDIATELY:  *FEVER GREATER THAN 100.5 F  *CHILLS WITH OR WITHOUT FEVER  NAUSEA AND VOMITING THAT IS NOT CONTROLLED WITH YOUR NAUSEA MEDICATION  *UNUSUAL SHORTNESS OF BREATH  *UNUSUAL BRUISING OR BLEEDING  TENDERNESS IN MOUTH AND THROAT WITH OR WITHOUT PRESENCE OF ULCERS  *URINARY PROBLEMS  *BOWEL PROBLEMS  UNUSUAL RASH Items with * indicate a potential emergency and should be followed up as soon as possible.  Feel free to call the clinic should you have any questions or concerns. The clinic phone number is [phone].  Please show the [facility] at check-in to the Emergency Department and triage nurse.

## 2020-12-15 ENCOUNTER — Inpatient Hospital Stay: Payer: Medicare Other

## 2020-12-15 ENCOUNTER — Other Ambulatory Visit: Payer: Self-pay

## 2020-12-15 ENCOUNTER — Ambulatory Visit (HOSPITAL_COMMUNITY): Payer: Medicare Other

## 2020-12-15 VITALS — BP 120/60 | HR 72 | Resp 18

## 2020-12-15 DIAGNOSIS — T451X5A Adverse effect of antineoplastic and immunosuppressive drugs, initial encounter: Secondary | ICD-10-CM | POA: Diagnosis not present

## 2020-12-15 DIAGNOSIS — C185 Malignant neoplasm of splenic flexure: Secondary | ICD-10-CM | POA: Diagnosis not present

## 2020-12-15 DIAGNOSIS — D709 Neutropenia, unspecified: Secondary | ICD-10-CM | POA: Diagnosis not present

## 2020-12-15 DIAGNOSIS — Z5189 Encounter for other specified aftercare: Secondary | ICD-10-CM | POA: Diagnosis not present

## 2020-12-15 DIAGNOSIS — E876 Hypokalemia: Secondary | ICD-10-CM | POA: Diagnosis not present

## 2020-12-15 DIAGNOSIS — G620 Drug-induced polyneuropathy: Secondary | ICD-10-CM | POA: Diagnosis not present

## 2020-12-15 DIAGNOSIS — I4891 Unspecified atrial fibrillation: Secondary | ICD-10-CM | POA: Diagnosis not present

## 2020-12-15 DIAGNOSIS — Z5111 Encounter for antineoplastic chemotherapy: Secondary | ICD-10-CM | POA: Diagnosis not present

## 2020-12-15 MED ORDER — PEGFILGRASTIM-CBQV 6 MG/0.6ML ~~LOC~~ SOSY
6.0000 mg | PREFILLED_SYRINGE | Freq: Once | SUBCUTANEOUS | Status: AC
  Administered 2020-12-15: 6 mg via SUBCUTANEOUS

## 2020-12-15 MED ORDER — SODIUM CHLORIDE 0.9% FLUSH
10.0000 mL | INTRAVENOUS | Status: DC | PRN
  Administered 2020-12-15: 10 mL
  Filled 2020-12-15: qty 10

## 2020-12-15 MED ORDER — PEGFILGRASTIM-CBQV 6 MG/0.6ML ~~LOC~~ SOSY
PREFILLED_SYRINGE | SUBCUTANEOUS | Status: AC
  Filled 2020-12-15: qty 0.6

## 2020-12-15 MED ORDER — HEPARIN SOD (PORK) LOCK FLUSH 100 UNIT/ML IV SOLN
500.0000 [IU] | Freq: Once | INTRAVENOUS | Status: AC | PRN
  Administered 2020-12-15: 500 [IU]
  Filled 2020-12-15: qty 5

## 2020-12-19 ENCOUNTER — Other Ambulatory Visit: Payer: Self-pay

## 2020-12-19 ENCOUNTER — Ambulatory Visit (HOSPITAL_COMMUNITY)
Admission: RE | Admit: 2020-12-19 | Discharge: 2020-12-19 | Disposition: A | Discharge status: Home / Self Care | Payer: Medicare Other | Source: Ambulatory Visit | Attending: Oncology | Admitting: Oncology

## 2020-12-19 DIAGNOSIS — I2510 Atherosclerotic heart disease of native coronary artery without angina pectoris: Secondary | ICD-10-CM | POA: Diagnosis not present

## 2020-12-19 DIAGNOSIS — K7689 Other specified diseases of liver: Secondary | ICD-10-CM | POA: Diagnosis not present

## 2020-12-19 DIAGNOSIS — I96 Gangrene, not elsewhere classified: Secondary | ICD-10-CM | POA: Diagnosis not present

## 2020-12-19 DIAGNOSIS — C185 Malignant neoplasm of splenic flexure: Secondary | ICD-10-CM | POA: Diagnosis not present

## 2020-12-19 DIAGNOSIS — I700 Atherosclerosis of aorta: Secondary | ICD-10-CM | POA: Diagnosis not present

## 2020-12-19 DIAGNOSIS — J984 Other disorders of lung: Secondary | ICD-10-CM | POA: Diagnosis not present

## 2020-12-19 DIAGNOSIS — C19 Malignant neoplasm of rectosigmoid junction: Secondary | ICD-10-CM | POA: Diagnosis not present

## 2020-12-19 MED ORDER — IOHEXOL 300 MG/ML  SOLN
100.0000 mL | Freq: Once | INTRAMUSCULAR | Status: AC | PRN
  Administered 2020-12-19: 80 mL via INTRAVENOUS

## 2020-12-21 ENCOUNTER — Telehealth: Payer: Self-pay

## 2020-12-21 ENCOUNTER — Other Ambulatory Visit (HOSPITAL_COMMUNITY): Payer: Self-pay | Admitting: Family Medicine

## 2020-12-21 MED FILL — PRAVASTATIN SODIUM 20 MG TA: 20 | 90 days supply | Qty: 90 | Fill #0

## 2020-12-21 MED FILL — POTASSIUM CL ER 10 MEQ CAP: 10 | 30 days supply | Qty: 60 | Fill #1

## 2020-12-21 NOTE — Telephone Encounter (Signed)
Returned call to pt, per Dr. Benay Spice he looked at her recent CT scan results and said to tell pt: the Liver lesions look better and there is no evidence of progressive disease. Will discuss further at pt's 01/04/21 appointment. Pt verbalized understanding.

## 2021-01-01 ENCOUNTER — Other Ambulatory Visit: Payer: Self-pay | Admitting: Oncology

## 2021-01-04 ENCOUNTER — Inpatient Hospital Stay: Payer: Medicare Other

## 2021-01-04 ENCOUNTER — Other Ambulatory Visit: Payer: Self-pay

## 2021-01-04 ENCOUNTER — Inpatient Hospital Stay: Payer: Medicare Other | Admitting: Oncology

## 2021-01-04 ENCOUNTER — Inpatient Hospital Stay: Payer: Medicare Other | Attending: Oncology

## 2021-01-04 VITALS — BP 153/88 | HR 86 | Temp 97.8°F | Resp 18 | Ht 66.0 in | Wt 133.3 lb

## 2021-01-04 DIAGNOSIS — D709 Neutropenia, unspecified: Secondary | ICD-10-CM | POA: Insufficient documentation

## 2021-01-04 DIAGNOSIS — Z5189 Encounter for other specified aftercare: Secondary | ICD-10-CM | POA: Insufficient documentation

## 2021-01-04 DIAGNOSIS — C185 Malignant neoplasm of splenic flexure: Secondary | ICD-10-CM

## 2021-01-04 DIAGNOSIS — I4891 Unspecified atrial fibrillation: Secondary | ICD-10-CM | POA: Insufficient documentation

## 2021-01-04 DIAGNOSIS — E876 Hypokalemia: Secondary | ICD-10-CM | POA: Diagnosis not present

## 2021-01-04 DIAGNOSIS — Z95828 Presence of other vascular implants and grafts: Secondary | ICD-10-CM

## 2021-01-04 DIAGNOSIS — G629 Polyneuropathy, unspecified: Secondary | ICD-10-CM | POA: Insufficient documentation

## 2021-01-04 DIAGNOSIS — Z5111 Encounter for antineoplastic chemotherapy: Secondary | ICD-10-CM | POA: Diagnosis not present

## 2021-01-04 DIAGNOSIS — C787 Secondary malignant neoplasm of liver and intrahepatic bile duct: Secondary | ICD-10-CM | POA: Diagnosis not present

## 2021-01-04 LAB — CMP (CANCER CENTER ONLY)
ALT: 130 U/L — ABNORMAL HIGH (ref 0–44)
AST: 100 U/L — ABNORMAL HIGH (ref 15–41)
Albumin: 3.4 g/dL — ABNORMAL LOW (ref 3.5–5.0)
Alkaline Phosphatase: 201 U/L — ABNORMAL HIGH (ref 38–126)
Anion gap: 7 (ref 5–15)
BUN: 23 mg/dL (ref 8–23)
CO2: 24 mmol/L (ref 22–32)
Calcium: 9 mg/dL (ref 8.9–10.3)
Chloride: 110 mmol/L (ref 98–111)
Creatinine: 0.81 mg/dL (ref 0.44–1.00)
GFR, Estimated: >60 mL/min (ref >60)
Glucose, Bld: 74 mg/dL (ref 70–99)
Potassium: 4.2 mmol/L (ref 3.5–5.1)
Sodium: 141 mmol/L (ref 135–145)
Total Bilirubin: <0.2 mg/dL — ABNORMAL LOW (ref 0.3–1.2)
Total Protein: 6.9 g/dL (ref 6.5–8.1)

## 2021-01-04 LAB — CBC WITH DIFFERENTIAL (CANCER CENTER ONLY)
Abs Immature Granulocytes: 0.01 10*3/uL (ref 0.00–0.07)
Basophils Absolute: 0.1 10*3/uL (ref 0.0–0.1)
Basophils Relative: 1 %
Eosinophils Absolute: 0.2 10*3/uL (ref 0.0–0.5)
Eosinophils Relative: 3 %
HCT: 35.8 % — ABNORMAL LOW (ref 36.0–46.0)
Hemoglobin: 11.8 g/dL — ABNORMAL LOW (ref 12.0–15.0)
Immature Granulocytes: 0 %
Lymphocytes Relative: 21 %
Lymphs Abs: 1.3 10*3/uL (ref 0.7–4.0)
MCH: 35.9 pg — ABNORMAL HIGH (ref 26.0–34.0)
MCHC: 33 g/dL (ref 30.0–36.0)
MCV: 108.8 fL — ABNORMAL HIGH (ref 80.0–100.0)
Monocytes Absolute: 0.7 10*3/uL (ref 0.1–1.0)
Monocytes Relative: 11 %
Neutro Abs: 4 10*3/uL (ref 1.7–7.7)
Neutrophils Relative %: 64 %
Platelet Count: 213 10*3/uL (ref 150–400)
RBC: 3.29 MIL/uL — ABNORMAL LOW (ref 3.87–5.11)
RDW: 14.8 % (ref 11.5–15.5)
WBC Count: 6.2 10*3/uL (ref 4.0–10.5)
nRBC: 0 % (ref 0.0–0.2)

## 2021-01-04 LAB — CEA (IN HOUSE-CHCC): CEA (CHCC-In House): <1 ng/mL (ref 0.00–5.00)

## 2021-01-04 LAB — TOTAL PROTEIN, URINE DIPSTICK: Protein, ur: NEGATIVE mg/dL

## 2021-01-04 LAB — MAGNESIUM: Magnesium: 1.8 mg/dL (ref 1.7–2.4)

## 2021-01-04 MED ORDER — SODIUM CHLORIDE 0.9 % IV SOLN
1600.0000 mg/m2 | INTRAVENOUS | Status: DC
  Administered 2021-01-04: 2650 mg via INTRAVENOUS
  Filled 2021-01-04: qty 53

## 2021-01-04 MED ORDER — SODIUM CHLORIDE 0.9 % IV SOLN
400.0000 mg/m2 | Freq: Once | INTRAVENOUS | Status: AC
  Administered 2021-01-04: 660 mg via INTRAVENOUS
  Filled 2021-01-04: qty 33

## 2021-01-04 MED ORDER — ATROPINE SULFATE 1 MG/ML IJ SOLN
0.5000 mg | Freq: Once | INTRAMUSCULAR | Status: AC | PRN
  Administered 2021-01-04: 0.5 mg via INTRAVENOUS

## 2021-01-04 MED ORDER — BEVACIZUMAB-BVZR CHEMO INJECTION 400 MG/16ML
450.0000 mg | Freq: Once | INTRAVENOUS | Status: AC
Start: 2021-01-04 — End: 2021-01-04
  Administered 2021-01-04: 450 mg via INTRAVENOUS
  Filled 2021-01-04: qty 4

## 2021-01-04 MED ORDER — SODIUM CHLORIDE 0.9 % IV SOLN
150.0000 mg | Freq: Once | INTRAVENOUS | Status: AC
  Administered 2021-01-04: 150 mg via INTRAVENOUS
  Filled 2021-01-04: qty 150

## 2021-01-04 MED ORDER — PALONOSETRON HCL INJECTION 0.25 MG/5ML
INTRAVENOUS | Status: AC
  Filled 2021-01-04: qty 5

## 2021-01-04 MED ORDER — SODIUM CHLORIDE 0.9 % IV SOLN
Freq: Once | INTRAVENOUS | Status: AC
  Filled 2021-01-04: qty 250

## 2021-01-04 MED ORDER — SODIUM CHLORIDE 0.9 % IV SOLN
100.0000 mg/m2 | Freq: Once | INTRAVENOUS | Status: AC
  Administered 2021-01-04: 160 mg via INTRAVENOUS
  Filled 2021-01-04: qty 8

## 2021-01-04 MED ORDER — PALONOSETRON HCL INJECTION 0.25 MG/5ML
0.2500 mg | Freq: Once | INTRAVENOUS | Status: AC
  Administered 2021-01-04: 0.25 mg via INTRAVENOUS

## 2021-01-04 MED ORDER — SODIUM CHLORIDE 0.9% FLUSH
10.0000 mL | Freq: Once | INTRAVENOUS | Status: AC
  Administered 2021-01-04: 10 mL
  Filled 2021-01-04: qty 10

## 2021-01-04 MED ORDER — SODIUM CHLORIDE 0.9 % IV SOLN
10.0000 mg | Freq: Once | INTRAVENOUS | Status: AC
  Administered 2021-01-04: 10 mg via INTRAVENOUS
  Filled 2021-01-04: qty 10

## 2021-01-04 MED ORDER — ATROPINE SULFATE 1 MG/ML IJ SOLN
INTRAMUSCULAR | Status: AC
  Filled 2021-01-04: qty 1

## 2021-01-04 NOTE — Patient Instructions (Signed)

## 2021-01-04 NOTE — Progress Notes (Signed)
Ok to inf LV over 1 hour w/ Irinotecan.  Kennith Center, Pharm.D., CPP 01/04/2021@12 :55 PM

## 2021-01-04 NOTE — Patient Instructions (Addendum)
[facility] Discharge Instructions for Patients Receiving Chemotherapy  Today you received the following chemotherapy agents Bevacizumab, irinotecan, leukovorin. Adrucil   To help prevent nausea and vomiting after your treatment, we encourage you to take your nausea medication as directed. If you develop nausea and vomiting that is not controlled by your nausea medication, call the clinic.   BELOW ARE SYMPTOMS THAT SHOULD BE REPORTED IMMEDIATELY:  *FEVER GREATER THAN 100.5 F  *CHILLS WITH OR WITHOUT FEVER  NAUSEA AND VOMITING THAT IS NOT CONTROLLED WITH YOUR NAUSEA MEDICATION  *UNUSUAL SHORTNESS OF BREATH  *UNUSUAL BRUISING OR BLEEDING  TENDERNESS IN MOUTH AND THROAT WITH OR WITHOUT PRESENCE OF ULCERS  *URINARY PROBLEMS  *BOWEL PROBLEMS  UNUSUAL RASH Items with * indicate a potential emergency and should be followed up as soon as possible.  Feel free to call the clinic should you have any questions or concerns. The clinic phone number is [phone].  Please show the [facility] at check-in to the Emergency Department and triage nurse.

## 2021-01-04 NOTE — Progress Notes (Signed)
Ok to treat with elevated AST and ALT per Dr. Benay Spice    Pt discharged in no apparent distress. Pt left ambulatory without assistance.  Pt aware of discharge instructions and verbalized understanding and had no further questions.

## 2021-01-04 NOTE — Progress Notes (Signed)
[facility] OFFICE PROGRESS NOTE   Diagnosis: Colon cancer  INTERVAL HISTORY:   Kathy Robinson completed another cycle of FOLFIRI/Avastin on 12/13/2020.  No nausea/vomiting, mouth sores, diarrhea, or symptom of thrombosis.  She reports progressive numbness in the extremities.  Good appetite.  Objective:  Vital signs in last 24 hours:  Blood pressure (!) 153/88, pulse 86, temperature 97.8 F (36.6 C), temperature source Tympanic, resp. rate 18, height $RemoveBe'5\' 6"'rjPfJBqfA$  (1.676 m), weight 133 lb 4.8 oz (60.5 kg), last menstrual period 11/19/1996, SpO2 98 %.    HEENT: No thrush or ulcers Resp: Lungs clear bilaterally Cardio: Regular rate and rhythm GI: No hepatosplenomegaly, no mass, nontender Vascular: No leg edema  Skin: Palms without erythema  Portacath/PICC-without erythema  Lab Results:  Lab Results  Component Value Date   WBC 6.2 01/04/2021   HGB 11.8 (L) 01/04/2021   HCT 35.8 (L) 01/04/2021   MCV 108.8 (H) 01/04/2021   PLT 213 01/04/2021   NEUTROABS 4.0 01/04/2021    CMP  Lab Results  Component Value Date   NA 139 12/12/2020   K 4.4 12/12/2020   CL 108 12/12/2020   CO2 25 12/12/2020   GLUCOSE 105 (H) 12/12/2020   BUN 17 12/12/2020   CREATININE 0.77 12/12/2020   CALCIUM 8.9 12/12/2020   PROT 6.9 12/12/2020   ALBUMIN 3.4 (L) 12/12/2020   AST 67 (H) 12/12/2020   ALT 108 (H) 12/12/2020   ALKPHOS 180 (H) 12/12/2020   BILITOT 0.2 (L) 12/12/2020   GFRNONAA >60 12/12/2020   GFRAA >60 08/16/2020    Lab Results  Component Value Date   CEA1 1.19 12/12/2020      Imaging:  CT images from 12/19/2020 reviewed Medications: I have reviewed the patient's current medications.   Assessment/Plan: 1. Adenocarcinoma the left colon, stage IIIc (pT4b,pN2a), status post a left colectomy 05/30/2020, MSS, TMB 13, BRAF V600E ? Tumor invades the visceral peritoneum, lymphovascular and perineural invasion present, for tumor deposits, 7/13 lymph nodes, negative resection  margins, no loss of mismatch repair protein expression ? CT abdomen/pelvis 05/26/2020-long segment of masslike thickening of the descending colon with associated high-grade colonic obstruction, small colonic wall defect with evidence of a focally contained microperforation, retroperitoneal adenopathy, indeterminate small hypodense liver lesions ? Elevated preoperative CEA ? PET8/02/2020-hypermetabolic liver metastases, periportal and periaortic adenopathy, hypermetabolic mediastinal and left supraclavicular nodes ? Cycle 1 FOLFOX 07/05/20  ? Cycle 2 FOLFOXIRI (dose-reduced 5FU, no bolus) and Avastin 07/19/20 ? Cycle 3 FOLFOXIRI (Dose reduced 5-FU, no bolus)/Avastin 08/02/2020 ? Cycle 4 FOLFOXIRI/Avastin 08/16/2020 (Udenyca added) ? Cycle 5 FOLFOXIRI/Avastin 08/30/2020 ? CTs 09/09/2020-diminished size of lymph nodes in the chest, abdomen, and pelvis. Decreased hepatic metastases. No new evidence of disease progression, fat density lesion surrounding the left hemicolectomy ? Cycle 6 FOLFOXIRI/Avastin 09/13/2020 ? Cycle 7 FOLFOX/Avastin 09/27/2020 (irinotecan held) ? Cycle 8 FOLFOXIRI/Avastin 10/18/2020  ? Cycle 9 FOLFOXIRI/Avastin 11/01/2020 ? Cycle 10 FOLFOXIRI/Avastin 11/22/2020 (oxaliplatin held, irinotecan and 5-FU dose reduced) ? Cycle 11 FOLFOXIRI/Avastin 12/12/2020 (oxaliplatin held) ? CTs 12/19/2020-decrease in size of liver metastases, no evidence of disease progression ? Cycle 12 FOLFOXIRI/Avastin 01/04/2021 (oxaliplatin held) 2. Atrial fibrillation with rapid ventricular response 05/26/2020 3. Mild neutropenia following cycle 3 FOLFOXIRI, Udenyca added with cycle 4 4. Hypokalemia secondary to diarrhea-potassium supplementation starting 09/13/2020 5. Oxaliplatin neuropathy-moderate loss of vibratory sense on exam 11/22/2020, oxaliplatin held with cycle 10, cycle 11 chemotherapy   Disposition: Kathy Robinson appears stable.  The restaging CTs confirm improvement in liver metastases.  She will  complete another cycle of FOLFIRI/Avastin today.  Oxaliplatin will made on hold secondary to neuropathy symptoms.  Kathy Robinson will return for an office visit and chemotherapy in 3 weeks.  [name], MD  01/04/2021  10:16 AM

## 2021-01-05 ENCOUNTER — Telehealth: Payer: Self-pay | Admitting: Oncology

## 2021-01-05 DIAGNOSIS — C185 Malignant neoplasm of splenic flexure: Secondary | ICD-10-CM | POA: Diagnosis not present

## 2021-01-05 NOTE — Telephone Encounter (Signed)
Scheduled appointments per 2/16 los. Spoke to patient who is aware of appointments dates and times.  

## 2021-01-06 ENCOUNTER — Other Ambulatory Visit: Payer: Self-pay

## 2021-01-06 ENCOUNTER — Inpatient Hospital Stay: Payer: Medicare Other

## 2021-01-06 VITALS — BP 124/66 | HR 71 | Resp 18

## 2021-01-06 DIAGNOSIS — C185 Malignant neoplasm of splenic flexure: Secondary | ICD-10-CM | POA: Diagnosis not present

## 2021-01-06 DIAGNOSIS — Z5189 Encounter for other specified aftercare: Secondary | ICD-10-CM | POA: Diagnosis not present

## 2021-01-06 DIAGNOSIS — E876 Hypokalemia: Secondary | ICD-10-CM | POA: Diagnosis not present

## 2021-01-06 DIAGNOSIS — G629 Polyneuropathy, unspecified: Secondary | ICD-10-CM | POA: Diagnosis not present

## 2021-01-06 DIAGNOSIS — C787 Secondary malignant neoplasm of liver and intrahepatic bile duct: Secondary | ICD-10-CM | POA: Diagnosis not present

## 2021-01-06 DIAGNOSIS — D709 Neutropenia, unspecified: Secondary | ICD-10-CM | POA: Diagnosis not present

## 2021-01-06 DIAGNOSIS — Z5111 Encounter for antineoplastic chemotherapy: Secondary | ICD-10-CM | POA: Diagnosis not present

## 2021-01-06 DIAGNOSIS — I4891 Unspecified atrial fibrillation: Secondary | ICD-10-CM | POA: Diagnosis not present

## 2021-01-06 MED ORDER — HEPARIN SOD (PORK) LOCK FLUSH 100 UNIT/ML IV SOLN
500.0000 [IU] | Freq: Once | INTRAVENOUS | Status: AC | PRN
  Administered 2021-01-06: 500 [IU]
  Filled 2021-01-06: qty 5

## 2021-01-06 MED ORDER — PEGFILGRASTIM-CBQV 6 MG/0.6ML ~~LOC~~ SOSY
6.0000 mg | PREFILLED_SYRINGE | Freq: Once | SUBCUTANEOUS | Status: AC
  Administered 2021-01-06: 6 mg via SUBCUTANEOUS

## 2021-01-06 MED ORDER — PEGFILGRASTIM-CBQV 6 MG/0.6ML ~~LOC~~ SOSY
PREFILLED_SYRINGE | SUBCUTANEOUS | Status: AC
  Filled 2021-01-06: qty 0.6

## 2021-01-06 MED ORDER — SODIUM CHLORIDE 0.9% FLUSH
10.0000 mL | INTRAVENOUS | Status: DC | PRN
Start: 2021-01-06 — End: 2021-01-06
  Administered 2021-01-06: 10 mL
  Filled 2021-01-06: qty 10

## 2021-01-11 DIAGNOSIS — E7800 Pure hypercholesterolemia, unspecified: Secondary | ICD-10-CM | POA: Diagnosis not present

## 2021-01-11 DIAGNOSIS — C189 Malignant neoplasm of colon, unspecified: Secondary | ICD-10-CM | POA: Diagnosis not present

## 2021-01-22 ENCOUNTER — Other Ambulatory Visit: Payer: Self-pay | Admitting: Oncology

## 2021-01-24 ENCOUNTER — Inpatient Hospital Stay: Payer: Medicare Other | Attending: Oncology

## 2021-01-24 ENCOUNTER — Inpatient Hospital Stay: Payer: Medicare Other

## 2021-01-24 ENCOUNTER — Inpatient Hospital Stay (HOSPITAL_BASED_OUTPATIENT_CLINIC_OR_DEPARTMENT_OTHER): Payer: Medicare Other | Admitting: Nurse Practitioner

## 2021-01-24 ENCOUNTER — Encounter: Payer: Self-pay | Admitting: Nurse Practitioner

## 2021-01-24 ENCOUNTER — Other Ambulatory Visit: Payer: Self-pay | Admitting: Nurse Practitioner

## 2021-01-24 ENCOUNTER — Other Ambulatory Visit: Payer: Self-pay

## 2021-01-24 VITALS — BP 135/72 | HR 97 | Temp 97.0°F | Resp 18 | Ht 66.0 in | Wt 137.9 lb

## 2021-01-24 DIAGNOSIS — E876 Hypokalemia: Secondary | ICD-10-CM | POA: Diagnosis not present

## 2021-01-24 DIAGNOSIS — Z5189 Encounter for other specified aftercare: Secondary | ICD-10-CM | POA: Diagnosis not present

## 2021-01-24 DIAGNOSIS — C185 Malignant neoplasm of splenic flexure: Secondary | ICD-10-CM

## 2021-01-24 DIAGNOSIS — T451X5A Adverse effect of antineoplastic and immunosuppressive drugs, initial encounter: Secondary | ICD-10-CM | POA: Diagnosis not present

## 2021-01-24 DIAGNOSIS — Z5111 Encounter for antineoplastic chemotherapy: Secondary | ICD-10-CM | POA: Insufficient documentation

## 2021-01-24 DIAGNOSIS — I4891 Unspecified atrial fibrillation: Secondary | ICD-10-CM | POA: Diagnosis not present

## 2021-01-24 DIAGNOSIS — Z95828 Presence of other vascular implants and grafts: Secondary | ICD-10-CM

## 2021-01-24 DIAGNOSIS — D702 Other drug-induced agranulocytosis: Secondary | ICD-10-CM | POA: Insufficient documentation

## 2021-01-24 DIAGNOSIS — C787 Secondary malignant neoplasm of liver and intrahepatic bile duct: Secondary | ICD-10-CM | POA: Insufficient documentation

## 2021-01-24 DIAGNOSIS — G620 Drug-induced polyneuropathy: Secondary | ICD-10-CM | POA: Insufficient documentation

## 2021-01-24 LAB — CBC WITH DIFFERENTIAL (CANCER CENTER ONLY)
Abs Immature Granulocytes: 0.02 10*3/uL (ref 0.00–0.07)
Basophils Absolute: 0.1 10*3/uL (ref 0.0–0.1)
Basophils Relative: 1 %
Eosinophils Absolute: 0.3 10*3/uL (ref 0.0–0.5)
Eosinophils Relative: 5 %
HCT: 37.7 % (ref 36.0–46.0)
Hemoglobin: 12.5 g/dL (ref 12.0–15.0)
Immature Granulocytes: 0 %
Lymphocytes Relative: 23 %
Lymphs Abs: 1.5 10*3/uL (ref 0.7–4.0)
MCH: 36.2 pg — ABNORMAL HIGH (ref 26.0–34.0)
MCHC: 33.2 g/dL (ref 30.0–36.0)
MCV: 109.3 fL — ABNORMAL HIGH (ref 80.0–100.0)
Monocytes Absolute: 0.7 10*3/uL (ref 0.1–1.0)
Monocytes Relative: 11 %
Neutro Abs: 3.9 10*3/uL (ref 1.7–7.7)
Neutrophils Relative %: 60 %
Platelet Count: 213 10*3/uL (ref 150–400)
RBC: 3.45 MIL/uL — ABNORMAL LOW (ref 3.87–5.11)
RDW: 14.2 % (ref 11.5–15.5)
WBC Count: 6.5 10*3/uL (ref 4.0–10.5)
nRBC: 0 % (ref 0.0–0.2)

## 2021-01-24 LAB — CMP (CANCER CENTER ONLY)
ALT: 78 U/L — ABNORMAL HIGH (ref 0–44)
AST: 49 U/L — ABNORMAL HIGH (ref 15–41)
Albumin: 3.4 g/dL — ABNORMAL LOW (ref 3.5–5.0)
Alkaline Phosphatase: 174 U/L — ABNORMAL HIGH (ref 38–126)
Anion gap: 8 (ref 5–15)
BUN: 20 mg/dL (ref 8–23)
CO2: 24 mmol/L (ref 22–32)
Calcium: 9.1 mg/dL (ref 8.9–10.3)
Chloride: 109 mmol/L (ref 98–111)
Creatinine: 0.78 mg/dL (ref 0.44–1.00)
GFR, Estimated: >60 mL/min (ref >60)
Glucose, Bld: 82 mg/dL (ref 70–99)
Potassium: 4.2 mmol/L (ref 3.5–5.1)
Sodium: 141 mmol/L (ref 135–145)
Total Bilirubin: 0.3 mg/dL (ref 0.3–1.2)
Total Protein: 6.8 g/dL (ref 6.5–8.1)

## 2021-01-24 LAB — TOTAL PROTEIN, URINE DIPSTICK

## 2021-01-24 LAB — CEA (IN HOUSE-CHCC): CEA (CHCC-In House): <1 ng/mL (ref 0.00–5.00)

## 2021-01-24 MED ORDER — POTASSIUM CHLORIDE ER 10 MEQ PO CPCR: 10.0000 meq | ORAL_CAPSULE | Freq: Two times a day (BID) | ORAL | 1 refills | Status: DC

## 2021-01-24 MED ORDER — SODIUM CHLORIDE 0.9% FLUSH
10.0000 mL | Freq: Once | INTRAVENOUS | Status: AC
  Administered 2021-01-24: 10 mL
  Filled 2021-01-24: qty 10

## 2021-01-24 MED ORDER — ATROPINE SULFATE 1 MG/ML IJ SOLN
INTRAMUSCULAR | Status: AC
  Filled 2021-01-24: qty 1

## 2021-01-24 MED ORDER — SODIUM CHLORIDE 0.9% FLUSH
10.0000 mL | INTRAVENOUS | Status: DC | PRN
  Filled 2021-01-24: qty 10

## 2021-01-24 MED ORDER — SODIUM CHLORIDE 0.9 % IV SOLN
10.0000 mg | Freq: Once | INTRAVENOUS | Status: AC
  Administered 2021-01-24: 10 mg via INTRAVENOUS
  Filled 2021-01-24: qty 10

## 2021-01-24 MED ORDER — DEXTROSE 5 % IV SOLN
Freq: Once | INTRAVENOUS | Status: DC
  Filled 2021-01-24: qty 250

## 2021-01-24 MED ORDER — LEUCOVORIN CALCIUM INJECTION 350 MG
400.0000 mg/m2 | Freq: Once | INTRAVENOUS | Status: AC
  Administered 2021-01-24: 660 mg via INTRAVENOUS
  Filled 2021-01-24: qty 33

## 2021-01-24 MED ORDER — SODIUM CHLORIDE 0.9 % IV SOLN
150.0000 mg | Freq: Once | INTRAVENOUS | Status: AC
  Administered 2021-01-24: 150 mg via INTRAVENOUS
  Filled 2021-01-24: qty 150

## 2021-01-24 MED ORDER — HEPARIN SOD (PORK) LOCK FLUSH 100 UNIT/ML IV SOLN
500.0000 [IU] | Freq: Once | INTRAVENOUS | Status: DC | PRN
  Filled 2021-01-24: qty 5

## 2021-01-24 MED ORDER — SODIUM CHLORIDE 0.9 % IV SOLN
Freq: Once | INTRAVENOUS | Status: AC
  Filled 2021-01-24: qty 250

## 2021-01-24 MED ORDER — PALONOSETRON HCL INJECTION 0.25 MG/5ML
INTRAVENOUS | Status: AC
  Filled 2021-01-24: qty 5

## 2021-01-24 MED ORDER — PALONOSETRON HCL INJECTION 0.25 MG/5ML
0.2500 mg | Freq: Once | INTRAVENOUS | Status: AC
  Administered 2021-01-24: 0.25 mg via INTRAVENOUS

## 2021-01-24 MED ORDER — BEVACIZUMAB-BVZR CHEMO INJECTION 400 MG/16ML
450.0000 mg | Freq: Once | INTRAVENOUS | Status: AC
Start: 2021-01-24 — End: 2021-01-24
  Administered 2021-01-24: 450 mg via INTRAVENOUS
  Filled 2021-01-24: qty 16

## 2021-01-24 MED ORDER — SODIUM CHLORIDE 0.9 % IV SOLN
1600.0000 mg/m2 | INTRAVENOUS | Status: DC
  Administered 2021-01-24: 2650 mg via INTRAVENOUS
  Filled 2021-01-24: qty 53

## 2021-01-24 MED ORDER — ATROPINE SULFATE 1 MG/ML IJ SOLN
0.5000 mg | Freq: Once | INTRAMUSCULAR | Status: AC | PRN
  Administered 2021-01-24: 0.5 mg via INTRAVENOUS

## 2021-01-24 MED ORDER — SODIUM CHLORIDE 0.9 % IV SOLN
100.0000 mg/m2 | Freq: Once | INTRAVENOUS | Status: AC
  Administered 2021-01-24: 160 mg via INTRAVENOUS
  Filled 2021-01-24: qty 8

## 2021-01-24 MED FILL — POTASSIUM CL ER 10 MEQ CAP: 10 | 30 days supply | Qty: 60 | Fill #0

## 2021-01-24 NOTE — Patient Instructions (Signed)
Implanted Port Insertion, Care After This sheet gives you information about how to care for yourself after your procedure. Your health care provider may also give you more specific instructions. If you have problems or questions, contact your health care provider. What can I expect after the procedure? After the procedure, it is common to have:  Discomfort at the port insertion site.  Bruising on the skin over the port. This should improve over 3-4 days. Follow these instructions at home: Port care  After your port is placed, you will get a manufacturer's information card. The card has information about your port. Keep this card with you at all times.  Take care of the port as told by your health care provider. Ask your health care provider if you or a family member can get training for taking care of the port at home. A home health care nurse may also take care of the port.  Make sure to remember what type of port you have. Incision care  Follow instructions from your health care provider about how to take care of your port insertion site. Make sure you: ? Wash your hands with soap and water before and after you change your bandage (dressing). If soap and water are not available, use hand sanitizer. ? Change your dressing as told by your health care provider. ? Leave stitches (sutures), skin glue, or adhesive strips in place. These skin closures may need to stay in place for 2 weeks or longer. If adhesive strip edges start to loosen and curl up, you may trim the loose edges. Do not remove adhesive strips completely unless your health care provider tells you to do that.  Check your port insertion site every day for signs of infection. Check for: ? Redness, swelling, or pain. ? Fluid or blood. ? Warmth. ? Pus or a bad smell.      Activity  Return to your normal activities as told by your health care provider. Ask your health care provider what activities are safe for you.  Do not  lift anything that is heavier than 10 lb (4.5 kg), or the limit that you are told, until your health care provider says that it is safe. General instructions  Take over-the-counter and prescription medicines only as told by your health care provider.  Do not take baths, swim, or use a hot tub until your health care provider approves. Ask your health care provider if you may take showers. You may only be allowed to take sponge baths.  Do not drive for 24 hours if you were given a sedative during your procedure.  Wear a medical alert bracelet in case of an emergency. This will tell any health care providers that you have a port.  Keep all follow-up visits as told by your health care provider. This is important. Contact a health care provider if:  You cannot flush your port with saline as directed, or you cannot draw blood from the port.  You have a fever or chills.  You have redness, swelling, or pain around your port insertion site.  You have fluid or blood coming from your port insertion site.  Your port insertion site feels warm to the touch.  You have pus or a bad smell coming from the port insertion site. Get help right away if:  You have chest pain or shortness of breath.  You have bleeding from your port that you cannot control. Summary  Take care of the port as told by your   health care provider. Keep the manufacturer's information card with you at all times.  Change your dressing as told by your health care provider.  Contact a health care provider if you have a fever or chills or if you have redness, swelling, or pain around your port insertion site.  Keep all follow-up visits as told by your health care provider. This information is not intended to replace advice given to you by your health care provider. Make sure you discuss any questions you have with your health care provider. Document Revised: 06/03/2018 Document Reviewed: 06/03/2018 Elsevier Patient Education   2021 [employer]  

## 2021-01-24 NOTE — Progress Notes (Signed)
[facility] OFFICE PROGRESS NOTE   Diagnosis: Colon cancer  INTERVAL HISTORY:   Kathy Robinson returns as scheduled.  She completed cycle 12 FOLFOXIRI/Avastin 01/04/2021.  Oxaliplatin was held due to neuropathy symptoms.  She reports persistent numbness/tingling in the hands and feet.  Otherwise she feels well.  No nausea or vomiting.  No diarrhea.  No mouth sores.  She denies pain.  No bleeding.  No fever, cough, shortness of breath.  She describes her appetite as "fine".  She thinks she has gained some weight since her last visit.  Objective:  Vital signs in last 24 hours:  Blood pressure 135/72, pulse 97, temperature (!) 97 F (36.1 C), temperature source Tympanic, resp. rate 18, height $RemoveBe'5\' 6"'cDTpQGMkn$  (1.676 m), weight 137 lb 14.4 oz (62.6 kg), last menstrual period 11/19/1996, SpO2 97 %.    HEENT: No thrush or ulcers. Resp: Lungs clear bilaterally. Cardio: Regular rate and rhythm. GI: Abdomen soft and nontender.  No hepatomegaly. Vascular: No leg edema. Neuro: Alert and oriented. Skin: Palms without erythema. Port-A-Cath without erythema.   Lab Results:  Lab Results  Component Value Date   WBC 6.5 01/24/2021   HGB 12.5 01/24/2021   HCT 37.7 01/24/2021   MCV 109.3 (H) 01/24/2021   PLT 213 01/24/2021   NEUTROABS 3.9 01/24/2021    Imaging:  No results found.  Medications: I have reviewed the patient's current medications.  Assessment/Plan: 1. Adenocarcinoma the left colon, stage IIIc (pT4b,pN2a), status post a left colectomy 05/30/2020, MSS, TMB 13, BRAF V600E ? Tumor invades the visceral peritoneum, lymphovascular and perineural invasion present, for tumor deposits, 7/13 lymph nodes, negative resection margins, no loss of mismatch repair protein expression ? CT abdomen/pelvis 05/26/2020-long segment of masslike thickening of the descending colon with associated high-grade colonic obstruction, small colonic wall defect with evidence of a focally contained  microperforation, retroperitoneal adenopathy, indeterminate small hypodense liver lesions ? Elevated preoperative CEA ? PET8/02/2020-hypermetabolic liver metastases, periportal and periaortic adenopathy, hypermetabolic mediastinal and left supraclavicular nodes ? Cycle 1 FOLFOX 07/05/20  ? Cycle 2 FOLFOXIRI (dose-reduced 5FU, no bolus) and Avastin 07/19/20 ? Cycle 3 FOLFOXIRI (Dose reduced 5-FU, no bolus)/Avastin 08/02/2020 ? Cycle 4 FOLFOXIRI/Avastin 08/16/2020 (Udenyca added) ? Cycle 5 FOLFOXIRI/Avastin 08/30/2020 ? CTs 09/09/2020-diminished size of lymph nodes in the chest, abdomen, and pelvis. Decreased hepatic metastases. No new evidence of disease progression, fat density lesion surrounding the left hemicolectomy ? Cycle 6 FOLFOXIRI/Avastin 09/13/2020 ? Cycle 7 FOLFOX/Avastin 09/27/2020 (irinotecan held) ? Cycle 8 FOLFOXIRI/Avastin 10/18/2020 ? Cycle 9 FOLFOXIRI/Avastin 11/01/2020 ? Cycle 10 FOLFOXIRI/Avastin 11/22/2020 (oxaliplatin held, irinotecan and 5-FU dose reduced) ? Cycle 11 FOLFOXIRI/Avastin 12/12/2020 (oxaliplatin held) ? CTs 12/19/2020-decrease in size of liver metastases, no evidence of disease progression ? Cycle 12 FOLFOXIRI/Avastin 01/04/2021 (oxaliplatin held) ? Cycle 13 FOLFOXIRI/Avastin 01/24/2021 (oxaliplatin held) 2. Atrial fibrillation with rapid ventricular response 05/26/2020 3. Mild neutropenia following cycle 3 FOLFOXIRI, Udenyca added with cycle 4 4. Hypokalemia secondary to diarrhea-potassium supplementation starting 09/13/2020 5. Oxaliplatin neuropathy-moderate loss of vibratory sense on exam 11/22/2020, oxaliplatin held with cycle 10, cycle 11, 12, 13 chemotherapy   Disposition: Kathy Robinson appears stable.  She has completed 12 cycles of systemic therapy.  She is tolerating treatment well.  Plan to proceed with cycle 13 FOLFIRI/Avastin today as scheduled.  We reviewed the CBC from today.  Counts adequate to proceed with treatment.  She will return for lab,  follow-up, cycle 14 systemic therapy in 3 weeks.  She will contact the office in the interim with  any problems.    Ned Card ANP/GNP-BC   01/24/2021  10:05 AM

## 2021-01-25 ENCOUNTER — Telehealth: Payer: Self-pay | Admitting: Nurse Practitioner

## 2021-01-25 NOTE — Telephone Encounter (Signed)
Scheduled appointments per 3/8 los. Will have updated calendar printed for patient at next visit.

## 2021-01-26 ENCOUNTER — Inpatient Hospital Stay: Payer: Medicare Other

## 2021-01-26 ENCOUNTER — Other Ambulatory Visit: Payer: Self-pay

## 2021-01-26 VITALS — BP 122/64 | HR 74 | Temp 98.3°F | Resp 18

## 2021-01-26 DIAGNOSIS — Z5189 Encounter for other specified aftercare: Secondary | ICD-10-CM | POA: Diagnosis not present

## 2021-01-26 DIAGNOSIS — T451X5A Adverse effect of antineoplastic and immunosuppressive drugs, initial encounter: Secondary | ICD-10-CM | POA: Diagnosis not present

## 2021-01-26 DIAGNOSIS — C787 Secondary malignant neoplasm of liver and intrahepatic bile duct: Secondary | ICD-10-CM | POA: Diagnosis not present

## 2021-01-26 DIAGNOSIS — C185 Malignant neoplasm of splenic flexure: Secondary | ICD-10-CM | POA: Diagnosis not present

## 2021-01-26 DIAGNOSIS — Z5111 Encounter for antineoplastic chemotherapy: Secondary | ICD-10-CM | POA: Diagnosis not present

## 2021-01-26 DIAGNOSIS — D702 Other drug-induced agranulocytosis: Secondary | ICD-10-CM | POA: Diagnosis not present

## 2021-01-26 DIAGNOSIS — E876 Hypokalemia: Secondary | ICD-10-CM | POA: Diagnosis not present

## 2021-01-26 DIAGNOSIS — I4891 Unspecified atrial fibrillation: Secondary | ICD-10-CM | POA: Diagnosis not present

## 2021-01-26 DIAGNOSIS — G620 Drug-induced polyneuropathy: Secondary | ICD-10-CM | POA: Diagnosis not present

## 2021-01-26 MED ORDER — PEGFILGRASTIM-CBQV 6 MG/0.6ML ~~LOC~~ SOSY
PREFILLED_SYRINGE | SUBCUTANEOUS | Status: AC
  Filled 2021-01-26: qty 0.6

## 2021-01-26 MED ORDER — PEGFILGRASTIM-CBQV 6 MG/0.6ML ~~LOC~~ SOSY
6.0000 mg | PREFILLED_SYRINGE | Freq: Once | SUBCUTANEOUS | Status: AC
  Administered 2021-01-26: 6 mg via SUBCUTANEOUS

## 2021-01-26 MED ORDER — HEPARIN SOD (PORK) LOCK FLUSH 100 UNIT/ML IV SOLN
500.0000 [IU] | Freq: Once | INTRAVENOUS | Status: AC | PRN
  Administered 2021-01-26: 500 [IU]
  Filled 2021-01-26: qty 5

## 2021-01-26 MED ORDER — SODIUM CHLORIDE 0.9% FLUSH
10.0000 mL | INTRAVENOUS | Status: DC | PRN
  Administered 2021-01-26: 10 mL
  Filled 2021-01-26: qty 10

## 2021-01-26 NOTE — Patient Instructions (Signed)
Pegfilgrastim injection What is this medicine? PEGFILGRASTIM (PEG fil gra stim) is a long-acting granulocyte colony-stimulating factor that stimulates the growth of neutrophils, a type of white blood cell important in the body's fight against infection. It is used to reduce the incidence of fever and infection in patients with certain types of cancer who are receiving chemotherapy that affects the bone marrow, and to increase survival after being exposed to high doses of radiation. This medicine may be used for other purposes; ask your health care provider or pharmacist if you have questions. COMMON BRAND NAME(S): Fulphila, Neulasta, Nyvepria, UDENYCA, Ziextenzo What should I tell my health care provider before I take this medicine? They need to know if you have any of these conditions:  kidney disease  latex allergy  ongoing radiation therapy  sickle cell disease  skin reactions to acrylic adhesives (On-Body Injector only)  an unusual or allergic reaction to pegfilgrastim, filgrastim, other medicines, foods, dyes, or preservatives  pregnant or trying to get pregnant  breast-feeding How should I use this medicine? This medicine is for injection under the skin. If you get this medicine at home, you will be taught how to prepare and give the pre-filled syringe or how to use the On-body Injector. Refer to the patient Instructions for Use for detailed instructions. Use exactly as directed. Tell your healthcare provider immediately if you suspect that the On-body Injector may not have performed as intended or if you suspect the use of the On-body Injector resulted in a missed or partial dose. It is important that you put your used needles and syringes in a special sharps container. Do not put them in a trash can. If you do not have a sharps container, call your pharmacist or healthcare provider to get one. Talk to your pediatrician regarding the use of this medicine in children. While this drug  may be prescribed for selected conditions, precautions do apply. Overdosage: If you think you have taken too much of this medicine contact a poison control center or emergency room at once. NOTE: This medicine is only for you. Do not share this medicine with others. What if I miss a dose? It is important not to miss your dose. Call your doctor or health care professional if you miss your dose. If you miss a dose due to an On-body Injector failure or leakage, a new dose should be administered as soon as possible using a single prefilled syringe for manual use. What may interact with this medicine? Interactions have not been studied. This list may not describe all possible interactions. Give your health care provider a list of all the medicines, herbs, non-prescription drugs, or dietary supplements you use. Also tell them if you smoke, drink alcohol, or use illegal drugs. Some items may interact with your medicine. What should I watch for while using this medicine? Your condition will be monitored carefully while you are receiving this medicine. You may need blood work done while you are taking this medicine. Talk to your health care provider about your risk of cancer. You may be more at risk for certain types of cancer if you take this medicine. If you are going to need a MRI, CT scan, or other procedure, tell your doctor that you are using this medicine (On-Body Injector only). What side effects may I notice from receiving this medicine? Side effects that you should report to your doctor or health care professional as soon as possible:  allergic reactions (skin rash, itching or hives, swelling of   the face, lips, or tongue)  back pain  dizziness  fever  pain, redness, or irritation at site where injected  pinpoint red spots on the skin  red or dark-brown urine  shortness of breath or breathing problems  stomach or side pain, or pain at the shoulder  swelling  tiredness  trouble  passing urine or change in the amount of urine  unusual bruising or bleeding Side effects that usually do not require medical attention (report to your doctor or health care professional if they continue or are bothersome):  bone pain  muscle pain This list may not describe all possible side effects. Call your doctor for medical advice about side effects. You may report side effects to FDA at 1-800-FDA-1088. Where should I keep my medicine? Keep out of the reach of children. If you are using this medicine at home, you will be instructed on how to store it. Throw away any unused medicine after the expiration date on the label. NOTE: This sheet is a summary. It may not cover all possible information. If you have questions about this medicine, talk to your doctor, pharmacist, or health care provider.  2021 Elsevier/Gold Standard (2019-11-27 13:20:51)  

## 2021-02-02 DIAGNOSIS — C185 Malignant neoplasm of splenic flexure: Secondary | ICD-10-CM | POA: Diagnosis not present

## 2021-02-11 ENCOUNTER — Other Ambulatory Visit: Payer: Self-pay | Admitting: Oncology

## 2021-02-14 ENCOUNTER — Inpatient Hospital Stay: Payer: Medicare Other

## 2021-02-14 ENCOUNTER — Inpatient Hospital Stay: Payer: Medicare Other | Admitting: Oncology

## 2021-02-14 ENCOUNTER — Other Ambulatory Visit: Payer: Self-pay

## 2021-02-14 VITALS — BP 134/75 | HR 101 | Temp 97.9°F | Resp 18 | Ht 66.0 in | Wt 137.3 lb

## 2021-02-14 VITALS — HR 83

## 2021-02-14 DIAGNOSIS — C185 Malignant neoplasm of splenic flexure: Secondary | ICD-10-CM | POA: Diagnosis not present

## 2021-02-14 DIAGNOSIS — G620 Drug-induced polyneuropathy: Secondary | ICD-10-CM | POA: Diagnosis not present

## 2021-02-14 DIAGNOSIS — I4891 Unspecified atrial fibrillation: Secondary | ICD-10-CM | POA: Diagnosis not present

## 2021-02-14 DIAGNOSIS — D702 Other drug-induced agranulocytosis: Secondary | ICD-10-CM | POA: Diagnosis not present

## 2021-02-14 DIAGNOSIS — T451X5A Adverse effect of antineoplastic and immunosuppressive drugs, initial encounter: Secondary | ICD-10-CM | POA: Diagnosis not present

## 2021-02-14 DIAGNOSIS — E876 Hypokalemia: Secondary | ICD-10-CM | POA: Diagnosis not present

## 2021-02-14 DIAGNOSIS — C787 Secondary malignant neoplasm of liver and intrahepatic bile duct: Secondary | ICD-10-CM | POA: Diagnosis not present

## 2021-02-14 DIAGNOSIS — Z5189 Encounter for other specified aftercare: Secondary | ICD-10-CM | POA: Diagnosis not present

## 2021-02-14 DIAGNOSIS — Z5111 Encounter for antineoplastic chemotherapy: Secondary | ICD-10-CM | POA: Diagnosis not present

## 2021-02-14 LAB — CMP (CANCER CENTER ONLY)
ALT: 62 U/L — ABNORMAL HIGH (ref 0–44)
AST: 42 U/L — ABNORMAL HIGH (ref 15–41)
Albumin: 3.4 g/dL — ABNORMAL LOW (ref 3.5–5.0)
Alkaline Phosphatase: 153 U/L — ABNORMAL HIGH (ref 38–126)
Anion gap: 10 (ref 5–15)
BUN: 19 mg/dL (ref 8–23)
CO2: 24 mmol/L (ref 22–32)
Calcium: 8.8 mg/dL — ABNORMAL LOW (ref 8.9–10.3)
Chloride: 107 mmol/L (ref 98–111)
Creatinine: 0.96 mg/dL (ref 0.44–1.00)
GFR, Estimated: >60 mL/min (ref >60)
Glucose, Bld: 78 mg/dL (ref 70–99)
Potassium: 4.3 mmol/L (ref 3.5–5.1)
Sodium: 141 mmol/L (ref 135–145)
Total Bilirubin: 0.3 mg/dL (ref 0.3–1.2)
Total Protein: 6.8 g/dL (ref 6.5–8.1)

## 2021-02-14 LAB — CBC WITH DIFFERENTIAL (CANCER CENTER ONLY)
Abs Immature Granulocytes: 0.02 10*3/uL (ref 0.00–0.07)
Basophils Absolute: 0 10*3/uL (ref 0.0–0.1)
Basophils Relative: 1 %
Eosinophils Absolute: 0.3 10*3/uL (ref 0.0–0.5)
Eosinophils Relative: 4 %
HCT: 38 % (ref 36.0–46.0)
Hemoglobin: 12.4 g/dL (ref 12.0–15.0)
Immature Granulocytes: 0 %
Lymphocytes Relative: 22 %
Lymphs Abs: 1.5 10*3/uL (ref 0.7–4.0)
MCH: 35.7 pg — ABNORMAL HIGH (ref 26.0–34.0)
MCHC: 32.6 g/dL (ref 30.0–36.0)
MCV: 109.5 fL — ABNORMAL HIGH (ref 80.0–100.0)
Monocytes Absolute: 0.7 10*3/uL (ref 0.1–1.0)
Monocytes Relative: 10 %
Neutro Abs: 4.4 10*3/uL (ref 1.7–7.7)
Neutrophils Relative %: 63 %
Platelet Count: 224 10*3/uL (ref 150–400)
RBC: 3.47 MIL/uL — ABNORMAL LOW (ref 3.87–5.11)
RDW: 14 % (ref 11.5–15.5)
WBC Count: 6.9 10*3/uL (ref 4.0–10.5)
nRBC: 0 % (ref 0.0–0.2)

## 2021-02-14 LAB — MAGNESIUM: Magnesium: 1.8 mg/dL (ref 1.7–2.4)

## 2021-02-14 LAB — TOTAL PROTEIN, URINE DIPSTICK: Protein, ur: NEGATIVE mg/dL

## 2021-02-14 LAB — CEA (IN HOUSE-CHCC): CEA (CHCC-In House): 1.12 ng/mL (ref 0.00–5.00)

## 2021-02-14 MED ORDER — HEPARIN SOD (PORK) LOCK FLUSH 100 UNIT/ML IV SOLN
500.0000 [IU] | Freq: Once | INTRAVENOUS | Status: DC | PRN
  Filled 2021-02-14: qty 5

## 2021-02-14 MED ORDER — SODIUM CHLORIDE 0.9 % IV SOLN
400.0000 mg/m2 | Freq: Once | INTRAVENOUS | Status: AC
  Administered 2021-02-14: 660 mg via INTRAVENOUS
  Filled 2021-02-14: qty 33

## 2021-02-14 MED ORDER — SODIUM CHLORIDE 0.9 % IV SOLN
450.0000 mg | Freq: Once | INTRAVENOUS | Status: AC
  Administered 2021-02-14: 450 mg via INTRAVENOUS
  Filled 2021-02-14: qty 16

## 2021-02-14 MED ORDER — SODIUM CHLORIDE 0.9 % IV SOLN
150.0000 mg | Freq: Once | INTRAVENOUS | Status: AC
  Administered 2021-02-14: 150 mg via INTRAVENOUS
  Filled 2021-02-14: qty 150

## 2021-02-14 MED ORDER — SODIUM CHLORIDE 0.9 % IV SOLN
100.0000 mg/m2 | Freq: Once | INTRAVENOUS | Status: AC
  Administered 2021-02-14: 160 mg via INTRAVENOUS
  Filled 2021-02-14: qty 8

## 2021-02-14 MED ORDER — ATROPINE SULFATE 1 MG/ML IJ SOLN
INTRAMUSCULAR | Status: AC
  Filled 2021-02-14: qty 1

## 2021-02-14 MED ORDER — SODIUM CHLORIDE 0.9 % IV SOLN
10.0000 mg | Freq: Once | INTRAVENOUS | Status: AC
  Administered 2021-02-14: 10 mg via INTRAVENOUS
  Filled 2021-02-14: qty 1

## 2021-02-14 MED ORDER — SODIUM CHLORIDE 0.9% FLUSH
10.0000 mL | INTRAVENOUS | Status: DC | PRN
  Filled 2021-02-14: qty 10

## 2021-02-14 MED ORDER — SODIUM CHLORIDE 0.9 % IV SOLN
Freq: Once | INTRAVENOUS | Status: AC
Start: 2021-02-14 — End: 2021-02-14
  Filled 2021-02-14: qty 250

## 2021-02-14 MED ORDER — SODIUM CHLORIDE 0.9 % IV SOLN
1600.0000 mg/m2 | INTRAVENOUS | Status: DC
  Administered 2021-02-14: 2650 mg via INTRAVENOUS
  Filled 2021-02-14: qty 53

## 2021-02-14 MED ORDER — PALONOSETRON HCL INJECTION 0.25 MG/5ML
INTRAVENOUS | Status: AC
  Filled 2021-02-14: qty 5

## 2021-02-14 MED ORDER — PALONOSETRON HCL INJECTION 0.25 MG/5ML
0.2500 mg | Freq: Once | INTRAVENOUS | Status: AC
  Administered 2021-02-14: 0.25 mg via INTRAVENOUS

## 2021-02-14 MED ORDER — ATROPINE SULFATE 1 MG/ML IJ SOLN
0.5000 mg | Freq: Once | INTRAMUSCULAR | Status: AC | PRN
  Administered 2021-02-14: 0.5 mg via INTRAVENOUS

## 2021-02-14 NOTE — Progress Notes (Signed)
[facility] OFFICE PROGRESS NOTE   Diagnosis: Colon Cancer  INTERVAL HISTORY:   Kathy Robinson completed another cycle of FOLFIRI/Avastin on 01/24/2021.  No nausea or diarrhea.  Neuropathy symptoms have improved.  No bleeding or symptom of thrombosis.  No new complaint.  Objective:  Vital signs in last 24 hours:  Blood pressure 134/75, pulse (!) 101, temperature 97.9 F (36.6 C), temperature source Tympanic, resp. rate 18, height $RemoveBe'5\' 6"'mCRfuiCOt$  (1.676 m), weight 137 lb 4.8 oz (62.3 kg), last menstrual period 11/19/1996, SpO2 100 %.    HEENT: No thrush or ulcers Resp: Lungs clear bilaterally Cardio: Regular rate and rhythm GI: No mass, no hepatosplenomegaly, nontender Vascular: No leg edema    Portacath/PICC-without erythema  Lab Results:  Lab Results  Component Value Date   WBC 6.9 02/14/2021   HGB 12.4 02/14/2021   HCT 38.0 02/14/2021   MCV 109.5 (H) 02/14/2021   PLT 224 02/14/2021   NEUTROABS 4.4 02/14/2021    CMP  Lab Results  Component Value Date   NA 141 01/24/2021   K 4.2 01/24/2021   CL 109 01/24/2021   CO2 24 01/24/2021   GLUCOSE 82 01/24/2021   BUN 20 01/24/2021   CREATININE 0.78 01/24/2021   CALCIUM 9.1 01/24/2021   PROT 6.8 01/24/2021   ALBUMIN 3.4 (L) 01/24/2021   AST 49 (H) 01/24/2021   ALT 78 (H) 01/24/2021   ALKPHOS 174 (H) 01/24/2021   BILITOT 0.3 01/24/2021   GFRNONAA >60 01/24/2021   GFRAA >60 08/16/2020    Lab Results  Component Value Date   CEA1 <1.00 01/24/2021     Medications: I have reviewed the patient's current medications.   Assessment/Plan: 1. Adenocarcinoma the left colon, stage IIIc (pT4b,pN2a), status post a left colectomy 05/30/2020, MSS, TMB 13, BRAF V600E ? Tumor invades the visceral peritoneum, lymphovascular and perineural invasion present, for tumor deposits, 7/13 lymph nodes, negative resection margins, no loss of mismatch repair protein expression ? CT abdomen/pelvis 05/26/2020-long segment of masslike  thickening of the descending colon with associated high-grade colonic obstruction, small colonic wall defect with evidence of a focally contained microperforation, retroperitoneal adenopathy, indeterminate small hypodense liver lesions ? Elevated preoperative CEA ? PET8/02/2020-hypermetabolic liver metastases, periportal and periaortic adenopathy, hypermetabolic mediastinal and left supraclavicular nodes ? Cycle 1 FOLFOX 07/05/20  ? Cycle 2 FOLFOXIRI (dose-reduced 5FU, no bolus) and Avastin 07/19/20 ? Cycle 3 FOLFOXIRI (Dose reduced 5-FU, no bolus)/Avastin 08/02/2020 ? Cycle 4 FOLFOXIRI/Avastin 08/16/2020 (Udenyca added) ? Cycle 5 FOLFOXIRI/Avastin 08/30/2020 ? CTs 09/09/2020-diminished size of lymph nodes in the chest, abdomen, and pelvis. Decreased hepatic metastases. No new evidence of disease progression, fat density lesion surrounding the left hemicolectomy ? Cycle 6 FOLFOXIRI/Avastin 09/13/2020 ? Cycle 7 FOLFOX/Avastin 09/27/2020 (irinotecan held) ? Cycle 8 FOLFOXIRI/Avastin 10/18/2020 ? Cycle 9 FOLFOXIRI/Avastin 11/01/2020 ? Cycle 10 FOLFOXIRI/Avastin 11/22/2020 (oxaliplatin held, irinotecan and 5-FU dose reduced) ? Cycle 11 FOLFOXIRI/Avastin 12/12/2020 (oxaliplatin held) ? CTs 12/19/2020-decrease in size of liver metastases, no evidence of disease progression ? Cycle 12 FOLFOXIRI/Avastin 01/04/2021 (oxaliplatin held) ? Cycle 13 FOLFOXIRI/Avastin 01/24/2021 (oxaliplatin held) ? Cycle 14 FOLFOXIRI/Avastin 02/14/2021 (oxaliplatin held) 2. Atrial fibrillation with rapid ventricular response 05/26/2020 3. Mild neutropenia following cycle 3 FOLFOXIRI, Udenyca added with cycle 4 4. Hypokalemia secondary to diarrhea-potassium supplementation starting 09/13/2020 5. 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 Milbrath appears stable.  She is tolerating the FOLFIRI/Avastin well.  Oxaliplatin will remain on hold.  She will complete another cycle of chemotherapy today.  She will return for an office visit and chemotherapy in 3 weeks.  We will plan for a restaging CT evaluation after 2-3 more cycles of FOLFIRI/Avastin.   [name], MD  02/14/2021  11:12 AM

## 2021-02-14 NOTE — Patient Instructions (Signed)
[facility] Discharge Instructions for Patients Receiving Chemotherapy  Today you received the following chemotherapy agents Bevacizumab, Irinotecan, Leucovorin, and 5FU  To help prevent nausea and vomiting after your treatment, we encourage you to take your nausea medication as directed   If you develop nausea and vomiting that is not controlled by your nausea medication, call the clinic.   BELOW ARE SYMPTOMS THAT SHOULD BE REPORTED IMMEDIATELY:  *FEVER GREATER THAN 100.5 F  *CHILLS WITH OR WITHOUT FEVER  NAUSEA AND VOMITING THAT IS NOT CONTROLLED WITH YOUR NAUSEA MEDICATION  *UNUSUAL SHORTNESS OF BREATH  *UNUSUAL BRUISING OR BLEEDING  TENDERNESS IN MOUTH AND THROAT WITH OR WITHOUT PRESENCE OF ULCERS  *URINARY PROBLEMS  *BOWEL PROBLEMS  UNUSUAL RASH Items with * indicate a potential emergency and should be followed up as soon as possible.  Feel free to call the clinic should you have any questions or concerns. The clinic phone number is [phone].  Please show the [facility] at check-in to the Emergency Department and triage nurse.

## 2021-02-16 ENCOUNTER — Inpatient Hospital Stay: Payer: Medicare Other

## 2021-02-16 ENCOUNTER — Other Ambulatory Visit: Payer: Self-pay

## 2021-02-16 VITALS — BP 133/62 | HR 71

## 2021-02-16 DIAGNOSIS — E876 Hypokalemia: Secondary | ICD-10-CM | POA: Diagnosis not present

## 2021-02-16 DIAGNOSIS — D702 Other drug-induced agranulocytosis: Secondary | ICD-10-CM | POA: Diagnosis not present

## 2021-02-16 DIAGNOSIS — I4891 Unspecified atrial fibrillation: Secondary | ICD-10-CM | POA: Diagnosis not present

## 2021-02-16 DIAGNOSIS — Z5189 Encounter for other specified aftercare: Secondary | ICD-10-CM | POA: Diagnosis not present

## 2021-02-16 DIAGNOSIS — C787 Secondary malignant neoplasm of liver and intrahepatic bile duct: Secondary | ICD-10-CM | POA: Diagnosis not present

## 2021-02-16 DIAGNOSIS — C185 Malignant neoplasm of splenic flexure: Secondary | ICD-10-CM

## 2021-02-16 DIAGNOSIS — Z5111 Encounter for antineoplastic chemotherapy: Secondary | ICD-10-CM | POA: Diagnosis not present

## 2021-02-16 DIAGNOSIS — T451X5A Adverse effect of antineoplastic and immunosuppressive drugs, initial encounter: Secondary | ICD-10-CM | POA: Diagnosis not present

## 2021-02-16 DIAGNOSIS — G620 Drug-induced polyneuropathy: Secondary | ICD-10-CM | POA: Diagnosis not present

## 2021-02-16 MED ORDER — SODIUM CHLORIDE 0.9% FLUSH
10.0000 mL | INTRAVENOUS | Status: DC | PRN
  Administered 2021-02-16: 10 mL
  Filled 2021-02-16: qty 10

## 2021-02-16 MED ORDER — PEGFILGRASTIM-CBQV 6 MG/0.6ML ~~LOC~~ SOSY
PREFILLED_SYRINGE | SUBCUTANEOUS | Status: AC
  Filled 2021-02-16: qty 0.6

## 2021-02-16 MED ORDER — PEGFILGRASTIM-CBQV 6 MG/0.6ML ~~LOC~~ SOSY
6.0000 mg | PREFILLED_SYRINGE | Freq: Once | SUBCUTANEOUS | Status: AC
  Administered 2021-02-16: 6 mg via SUBCUTANEOUS

## 2021-02-16 MED ORDER — HEPARIN SOD (PORK) LOCK FLUSH 100 UNIT/ML IV SOLN
500.0000 [IU] | Freq: Once | INTRAVENOUS | Status: AC | PRN
  Administered 2021-02-16: 500 [IU]
  Filled 2021-02-16: qty 5

## 2021-02-22 ENCOUNTER — Other Ambulatory Visit (HOSPITAL_COMMUNITY): Payer: Self-pay

## 2021-02-22 MED FILL — Potassium Chloride Cap ER 10 mEq: ORAL | 30 days supply | Qty: 60 | Fill #0 | Status: AC

## 2021-03-02 ENCOUNTER — Other Ambulatory Visit: Payer: Self-pay | Admitting: Oncology

## 2021-03-05 DIAGNOSIS — C185 Malignant neoplasm of splenic flexure: Secondary | ICD-10-CM | POA: Diagnosis not present

## 2021-03-07 ENCOUNTER — Other Ambulatory Visit: Payer: Medicare Other

## 2021-03-07 ENCOUNTER — Inpatient Hospital Stay: Payer: Medicare Other

## 2021-03-07 ENCOUNTER — Inpatient Hospital Stay: Payer: Medicare Other | Attending: Oncology | Admitting: Oncology

## 2021-03-07 ENCOUNTER — Other Ambulatory Visit: Payer: Self-pay

## 2021-03-07 ENCOUNTER — Inpatient Hospital Stay (HOSPITAL_BASED_OUTPATIENT_CLINIC_OR_DEPARTMENT_OTHER): Payer: Medicare Other | Admitting: Oncology

## 2021-03-07 ENCOUNTER — Ambulatory Visit: Payer: Medicare Other

## 2021-03-07 VITALS — BP 139/69 | HR 96 | Temp 97.8°F | Resp 20 | Ht 66.0 in | Wt 139.0 lb

## 2021-03-07 VITALS — BP 129/86 | HR 72 | Temp 98.3°F | Resp 18

## 2021-03-07 DIAGNOSIS — I4891 Unspecified atrial fibrillation: Secondary | ICD-10-CM | POA: Diagnosis not present

## 2021-03-07 DIAGNOSIS — Z5111 Encounter for antineoplastic chemotherapy: Secondary | ICD-10-CM | POA: Diagnosis not present

## 2021-03-07 DIAGNOSIS — E876 Hypokalemia: Secondary | ICD-10-CM | POA: Insufficient documentation

## 2021-03-07 DIAGNOSIS — D709 Neutropenia, unspecified: Secondary | ICD-10-CM | POA: Insufficient documentation

## 2021-03-07 DIAGNOSIS — T451X5A Adverse effect of antineoplastic and immunosuppressive drugs, initial encounter: Secondary | ICD-10-CM | POA: Diagnosis not present

## 2021-03-07 DIAGNOSIS — C185 Malignant neoplasm of splenic flexure: Secondary | ICD-10-CM | POA: Diagnosis not present

## 2021-03-07 DIAGNOSIS — G620 Drug-induced polyneuropathy: Secondary | ICD-10-CM | POA: Insufficient documentation

## 2021-03-07 DIAGNOSIS — C787 Secondary malignant neoplasm of liver and intrahepatic bile duct: Secondary | ICD-10-CM | POA: Diagnosis not present

## 2021-03-07 DIAGNOSIS — Z5189 Encounter for other specified aftercare: Secondary | ICD-10-CM | POA: Diagnosis not present

## 2021-03-07 LAB — CBC WITH DIFFERENTIAL (CANCER CENTER ONLY)
Abs Immature Granulocytes: 0.01 10*3/uL (ref 0.00–0.07)
Basophils Absolute: 0.1 10*3/uL (ref 0.0–0.1)
Basophils Relative: 1 %
Eosinophils Absolute: 0.2 10*3/uL (ref 0.0–0.5)
Eosinophils Relative: 4 %
HCT: 39.8 % (ref 36.0–46.0)
Hemoglobin: 13.2 g/dL (ref 12.0–15.0)
Immature Granulocytes: 0 %
Lymphocytes Relative: 28 %
Lymphs Abs: 1.7 10*3/uL (ref 0.7–4.0)
MCH: 36.6 pg — ABNORMAL HIGH (ref 26.0–34.0)
MCHC: 33.2 g/dL (ref 30.0–36.0)
MCV: 110.2 fL — ABNORMAL HIGH (ref 80.0–100.0)
Monocytes Absolute: 0.7 10*3/uL (ref 0.1–1.0)
Monocytes Relative: 11 %
Neutro Abs: 3.5 10*3/uL (ref 1.7–7.7)
Neutrophils Relative %: 56 %
Platelet Count: 246 10*3/uL (ref 150–400)
RBC: 3.61 MIL/uL — ABNORMAL LOW (ref 3.87–5.11)
RDW: 13.9 % (ref 11.5–15.5)
WBC Count: 6.1 10*3/uL (ref 4.0–10.5)
nRBC: 0 % (ref 0.0–0.2)

## 2021-03-07 LAB — CMP (CANCER CENTER ONLY)
ALT: 80 U/L — ABNORMAL HIGH (ref 0–44)
AST: 56 U/L — ABNORMAL HIGH (ref 15–41)
Albumin: 3.9 g/dL (ref 3.5–5.0)
Alkaline Phosphatase: 145 U/L — ABNORMAL HIGH (ref 38–126)
Anion gap: 8 (ref 5–15)
BUN: 24 mg/dL — ABNORMAL HIGH (ref 8–23)
CO2: 26 mmol/L (ref 22–32)
Calcium: 9 mg/dL (ref 8.9–10.3)
Chloride: 106 mmol/L (ref 98–111)
Creatinine: 0.78 mg/dL (ref 0.44–1.00)
GFR, Estimated: >60 mL/min (ref >60)
Glucose, Bld: 98 mg/dL (ref 70–99)
Potassium: 4.2 mmol/L (ref 3.5–5.1)
Sodium: 140 mmol/L (ref 135–145)
Total Bilirubin: 0.4 mg/dL (ref 0.3–1.2)
Total Protein: 6.9 g/dL (ref 6.5–8.1)

## 2021-03-07 LAB — CEA (ACCESS): CEA (CHCC): 1.85 ng/mL (ref 0.00–5.00)

## 2021-03-07 LAB — TOTAL PROTEIN, URINE DIPSTICK: Protein, ur: NEGATIVE mg/dL

## 2021-03-07 LAB — MAGNESIUM: Magnesium: 2 mg/dL (ref 1.7–2.4)

## 2021-03-07 LAB — CEA (IN HOUSE-CHCC): CEA (CHCC-In House): 1.15 ng/mL (ref 0.00–5.00)

## 2021-03-07 MED ORDER — SODIUM CHLORIDE 0.9 % IV SOLN
Freq: Once | INTRAVENOUS | Status: AC
  Filled 2021-03-07: qty 250

## 2021-03-07 MED ORDER — SODIUM CHLORIDE 0.9 % IV SOLN
10.0000 mg | Freq: Once | INTRAVENOUS | Status: AC
Start: 2021-03-07 — End: 2021-03-07
  Administered 2021-03-07: 10 mg via INTRAVENOUS
  Filled 2021-03-07: qty 1

## 2021-03-07 MED ORDER — SODIUM CHLORIDE 0.9 % IV SOLN
150.0000 mg | Freq: Once | INTRAVENOUS | Status: AC
  Administered 2021-03-07: 150 mg via INTRAVENOUS
  Filled 2021-03-07: qty 5

## 2021-03-07 MED ORDER — DEXTROSE 5 % IV SOLN
Freq: Once | INTRAVENOUS | Status: DC
  Filled 2021-03-07: qty 250

## 2021-03-07 MED ORDER — PALONOSETRON HCL INJECTION 0.25 MG/5ML
0.2500 mg | Freq: Once | INTRAVENOUS | Status: AC
  Administered 2021-03-07: 0.25 mg via INTRAVENOUS
  Filled 2021-03-07: qty 5

## 2021-03-07 MED ORDER — SODIUM CHLORIDE 0.9 % IV SOLN
400.0000 mg/m2 | Freq: Once | INTRAVENOUS | Status: AC
  Administered 2021-03-07: 660 mg via INTRAVENOUS
  Filled 2021-03-07: qty 33

## 2021-03-07 MED ORDER — SODIUM CHLORIDE 0.9% FLUSH
10.0000 mL | INTRAVENOUS | Status: DC | PRN
  Filled 2021-03-07: qty 10

## 2021-03-07 MED ORDER — SODIUM CHLORIDE 0.9 % IV SOLN
INTRAVENOUS | Status: DC
  Filled 2021-03-07: qty 250

## 2021-03-07 MED ORDER — SODIUM CHLORIDE 0.9 % IV SOLN
1600.0000 mg/m2 | INTRAVENOUS | Status: DC
  Administered 2021-03-07: 2650 mg via INTRAVENOUS
  Filled 2021-03-07: qty 53

## 2021-03-07 MED ORDER — SODIUM CHLORIDE 0.9 % IV SOLN
450.0000 mg | Freq: Once | INTRAVENOUS | Status: AC
  Administered 2021-03-07: 450 mg via INTRAVENOUS
  Filled 2021-03-07: qty 4

## 2021-03-07 MED ORDER — HEPARIN SOD (PORK) LOCK FLUSH 100 UNIT/ML IV SOLN
500.0000 [IU] | Freq: Once | INTRAVENOUS | Status: DC | PRN
  Filled 2021-03-07: qty 5

## 2021-03-07 MED ORDER — SODIUM CHLORIDE 0.9 % IV SOLN
100.0000 mg/m2 | Freq: Once | INTRAVENOUS | Status: AC
  Administered 2021-03-07: 160 mg via INTRAVENOUS
  Filled 2021-03-07: qty 8

## 2021-03-07 MED ORDER — ATROPINE SULFATE 1 MG/ML IJ SOLN
0.5000 mg | Freq: Once | INTRAMUSCULAR | Status: AC | PRN
  Administered 2021-03-07: 0.5 mg via INTRAVENOUS
  Filled 2021-03-07: qty 1

## 2021-03-07 NOTE — Progress Notes (Signed)
Reviewed Liver lab results with MD. Madaline Brilliant to proceed

## 2021-03-07 NOTE — Progress Notes (Signed)
[facility] OFFICE PROGRESS NOTE   Diagnosis: Colon cancer  INTERVAL HISTORY:   Kathy Robinson completed another cycle of FOLFIRI/Avastin on 02/14/2021.  No nausea/vomiting, mouth sores, or diarrhea.  She feels well.  Good appetite.  She continues to have numbness in the extremities.  This has partially improved.  Objective:  Vital signs in last 24 hours:  Blood pressure 139/69, pulse 96, temperature 97.8 F (36.6 C), temperature source Tympanic, resp. rate 20, height $RemoveBe'5\' 6"'deFAmLAlE$  (1.676 m), weight 139 lb (63 kg), last menstrual period 11/19/1996, SpO2 100 %.    HEENT: No thrush or ulcers Resp: Lungs clear bilaterally Cardio: Regular rate and rhythm GI: No mass, nontender, no hepatosplenomegaly Vascular: No leg edema  Skin: Palms without erythema  Portacath/PICC-without erythema  Lab Results:  Lab Results  Component Value Date   WBC 6.1 03/07/2021   HGB 13.2 03/07/2021   HCT 39.8 03/07/2021   MCV 110.2 (H) 03/07/2021   PLT 246 03/07/2021   NEUTROABS 3.5 03/07/2021    CMP  Lab Results  Component Value Date   NA 141 02/14/2021   K 4.3 02/14/2021   CL 107 02/14/2021   CO2 24 02/14/2021   GLUCOSE 78 02/14/2021   BUN 19 02/14/2021   CREATININE 0.96 02/14/2021   CALCIUM 8.8 (L) 02/14/2021   PROT 6.8 02/14/2021   ALBUMIN 3.4 (L) 02/14/2021   AST 42 (H) 02/14/2021   ALT 62 (H) 02/14/2021   ALKPHOS 153 (H) 02/14/2021   BILITOT 0.3 02/14/2021   GFRNONAA >60 02/14/2021   GFRAA >60 08/16/2020    Lab Results  Component Value Date   CEA1 1.12 02/14/2021    Medications: I have reviewed the patient's current medications.   Assessment/Plan: 1. Adenocarcinoma the left colon, stage IIIc (pT4b,pN2a), status post a left colectomy 05/30/2020, MSS, TMB 13, BRAF V600E ? Tumor invades the visceral peritoneum, lymphovascular and perineural invasion present, for tumor deposits, 7/13 lymph nodes, negative resection margins, no loss of mismatch repair protein  expression ? CT abdomen/pelvis 05/26/2020-long segment of masslike thickening of the descending colon with associated high-grade colonic obstruction, small colonic wall defect with evidence of a focally contained microperforation, retroperitoneal adenopathy, indeterminate small hypodense liver lesions ? Elevated preoperative CEA ? PET8/02/2020-hypermetabolic liver metastases, periportal and periaortic adenopathy, hypermetabolic mediastinal and left supraclavicular nodes ? Cycle 1 FOLFOX 07/05/20  ? Cycle 2 FOLFOXIRI (dose-reduced 5FU, no bolus) and Avastin 07/19/20 ? Cycle 3 FOLFOXIRI (Dose reduced 5-FU, no bolus)/Avastin 08/02/2020 ? Cycle 4 FOLFOXIRI/Avastin 08/16/2020 (Udenyca added) ? Cycle 5 FOLFOXIRI/Avastin 08/30/2020 ? CTs 09/09/2020-diminished size of lymph nodes in the chest, abdomen, and pelvis. Decreased hepatic metastases. No new evidence of disease progression, fat density lesion surrounding the left hemicolectomy ? Cycle 6 FOLFOXIRI/Avastin 09/13/2020 ? Cycle 7 FOLFOX/Avastin 09/27/2020 (irinotecan held) ? Cycle 8 FOLFOXIRI/Avastin 10/18/2020 ? Cycle 9 FOLFOXIRI/Avastin 11/01/2020 ? Cycle 10 FOLFOXIRI/Avastin 11/22/2020 (oxaliplatin held, irinotecan and 5-FU dose reduced) ? Cycle 11 FOLFOXIRI/Avastin 12/12/2020 (oxaliplatin held) ? CTs 12/19/2020-decrease in size of liver metastases, no evidence of disease progression ? Cycle 12 FOLFOXIRI/Avastin 01/04/2021 (oxaliplatin held) ? Cycle 13 FOLFOXIRI/Avastin 01/24/2021 (oxaliplatin held) ? Cycle 14 FOLFOXIRI/Avastin 02/14/2021 (oxaliplatin held) ? Cycle 15 FOLFOXIRI/Avastin 03/07/2021 (oxaliplatin held) 2. Atrial fibrillation with rapid ventricular response 05/26/2020 3. Mild neutropenia following cycle 3 FOLFOXIRI, Udenyca added with cycle 4 4. Hypokalemia secondary to diarrhea-potassium supplementation starting 09/13/2020 5. 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  She is tolerating the chemotherapy well.  She will complete another cycle of chemotherapy today.  She will return for an office visit in the next cycle of chemotherapy in 3 weeks.  She will be scheduled for restaging CT evaluation after the next cycle of chemotherapy.  [name], MD  03/07/2021  11:04 AM

## 2021-03-09 ENCOUNTER — Inpatient Hospital Stay: Payer: Medicare Other

## 2021-03-09 ENCOUNTER — Other Ambulatory Visit: Payer: Self-pay

## 2021-03-09 VITALS — BP 148/70 | HR 71 | Temp 98.6°F | Resp 18

## 2021-03-09 DIAGNOSIS — T451X5A Adverse effect of antineoplastic and immunosuppressive drugs, initial encounter: Secondary | ICD-10-CM | POA: Diagnosis not present

## 2021-03-09 DIAGNOSIS — D709 Neutropenia, unspecified: Secondary | ICD-10-CM | POA: Diagnosis not present

## 2021-03-09 DIAGNOSIS — C185 Malignant neoplasm of splenic flexure: Secondary | ICD-10-CM | POA: Diagnosis not present

## 2021-03-09 DIAGNOSIS — I4891 Unspecified atrial fibrillation: Secondary | ICD-10-CM | POA: Diagnosis not present

## 2021-03-09 DIAGNOSIS — E876 Hypokalemia: Secondary | ICD-10-CM | POA: Diagnosis not present

## 2021-03-09 DIAGNOSIS — G620 Drug-induced polyneuropathy: Secondary | ICD-10-CM | POA: Diagnosis not present

## 2021-03-09 DIAGNOSIS — Z5189 Encounter for other specified aftercare: Secondary | ICD-10-CM | POA: Diagnosis not present

## 2021-03-09 DIAGNOSIS — Z5111 Encounter for antineoplastic chemotherapy: Secondary | ICD-10-CM | POA: Diagnosis not present

## 2021-03-09 DIAGNOSIS — Z95828 Presence of other vascular implants and grafts: Secondary | ICD-10-CM

## 2021-03-09 DIAGNOSIS — C787 Secondary malignant neoplasm of liver and intrahepatic bile duct: Secondary | ICD-10-CM | POA: Diagnosis not present

## 2021-03-09 MED ORDER — PEGFILGRASTIM-CBQV 6 MG/0.6ML ~~LOC~~ SOSY
6.0000 mg | PREFILLED_SYRINGE | Freq: Once | SUBCUTANEOUS | Status: AC
Start: 2021-03-09 — End: 2021-03-09
  Administered 2021-03-09: 6 mg via SUBCUTANEOUS

## 2021-03-09 MED ORDER — SODIUM CHLORIDE 0.9% FLUSH
10.0000 mL | INTRAVENOUS | Status: DC | PRN
  Administered 2021-03-09: 10 mL
  Filled 2021-03-09: qty 10

## 2021-03-09 MED ORDER — HEPARIN SOD (PORK) LOCK FLUSH 100 UNIT/ML IV SOLN
500.0000 [IU] | Freq: Once | INTRAVENOUS | Status: AC | PRN
Start: 2021-03-09 — End: 2021-03-09
  Administered 2021-03-09: 500 [IU]
  Filled 2021-03-09: qty 5

## 2021-03-22 ENCOUNTER — Other Ambulatory Visit: Payer: Self-pay | Admitting: Nurse Practitioner

## 2021-03-22 DIAGNOSIS — C185 Malignant neoplasm of splenic flexure: Secondary | ICD-10-CM

## 2021-03-24 ENCOUNTER — Encounter: Payer: Self-pay | Admitting: Nurse Practitioner

## 2021-03-24 ENCOUNTER — Other Ambulatory Visit: Payer: Self-pay | Admitting: Nurse Practitioner

## 2021-03-24 ENCOUNTER — Other Ambulatory Visit: Payer: Self-pay

## 2021-03-24 ENCOUNTER — Other Ambulatory Visit (HOSPITAL_COMMUNITY): Payer: Self-pay

## 2021-03-24 DIAGNOSIS — C185 Malignant neoplasm of splenic flexure: Secondary | ICD-10-CM

## 2021-03-24 MED ORDER — POTASSIUM CHLORIDE ER 10 MEQ PO CPCR
ORAL_CAPSULE | Freq: Two times a day (BID) | ORAL | 2 refills | Status: DC
  Filled 2021-03-24: qty 60, 30d supply, fill #0
  Filled 2021-04-29: qty 60, 30d supply, fill #1
  Filled 2021-05-27: qty 60, 30d supply, fill #2

## 2021-03-25 ENCOUNTER — Other Ambulatory Visit: Payer: Self-pay

## 2021-03-26 ENCOUNTER — Other Ambulatory Visit: Payer: Self-pay | Admitting: Oncology

## 2021-03-29 ENCOUNTER — Inpatient Hospital Stay: Payer: Medicare Other

## 2021-03-29 ENCOUNTER — Other Ambulatory Visit: Payer: Self-pay

## 2021-03-29 ENCOUNTER — Inpatient Hospital Stay: Payer: Medicare Other | Attending: Oncology

## 2021-03-29 ENCOUNTER — Encounter: Payer: Self-pay | Admitting: Nurse Practitioner

## 2021-03-29 ENCOUNTER — Inpatient Hospital Stay (HOSPITAL_BASED_OUTPATIENT_CLINIC_OR_DEPARTMENT_OTHER): Payer: Medicare Other | Admitting: Nurse Practitioner

## 2021-03-29 VITALS — BP 139/69 | HR 82 | Temp 97.8°F | Resp 20 | Ht 66.0 in | Wt 140.0 lb

## 2021-03-29 DIAGNOSIS — D709 Neutropenia, unspecified: Secondary | ICD-10-CM | POA: Diagnosis not present

## 2021-03-29 DIAGNOSIS — C185 Malignant neoplasm of splenic flexure: Secondary | ICD-10-CM | POA: Diagnosis not present

## 2021-03-29 DIAGNOSIS — E876 Hypokalemia: Secondary | ICD-10-CM | POA: Diagnosis not present

## 2021-03-29 DIAGNOSIS — I4891 Unspecified atrial fibrillation: Secondary | ICD-10-CM | POA: Diagnosis not present

## 2021-03-29 DIAGNOSIS — G629 Polyneuropathy, unspecified: Secondary | ICD-10-CM | POA: Insufficient documentation

## 2021-03-29 DIAGNOSIS — C787 Secondary malignant neoplasm of liver and intrahepatic bile duct: Secondary | ICD-10-CM | POA: Diagnosis not present

## 2021-03-29 LAB — CMP (CANCER CENTER ONLY)
ALT: 39 U/L (ref 0–44)
AST: 31 U/L (ref 15–41)
Albumin: 3.6 g/dL (ref 3.5–5.0)
Alkaline Phosphatase: 114 U/L (ref 38–126)
Anion gap: 7 (ref 5–15)
BUN: 19 mg/dL (ref 8–23)
CO2: 26 mmol/L (ref 22–32)
Calcium: 8.3 mg/dL — ABNORMAL LOW (ref 8.9–10.3)
Chloride: 107 mmol/L (ref 98–111)
Creatinine: 0.76 mg/dL (ref 0.44–1.00)
GFR, Estimated: >60 mL/min (ref >60)
Glucose, Bld: 92 mg/dL (ref 70–99)
Potassium: 4.1 mmol/L (ref 3.5–5.1)
Sodium: 140 mmol/L (ref 135–145)
Total Bilirubin: 0.3 mg/dL (ref 0.3–1.2)
Total Protein: 6.4 g/dL — ABNORMAL LOW (ref 6.5–8.1)

## 2021-03-29 LAB — CEA (ACCESS): CEA (CHCC): 1.45 ng/mL (ref 0.00–5.00)

## 2021-03-29 LAB — CBC WITH DIFFERENTIAL (CANCER CENTER ONLY)
Abs Immature Granulocytes: 0.01 10*3/uL (ref 0.00–0.07)
Basophils Absolute: 0 10*3/uL (ref 0.0–0.1)
Basophils Relative: 1 %
Eosinophils Absolute: 0.2 10*3/uL (ref 0.0–0.5)
Eosinophils Relative: 4 %
HCT: 39.2 % (ref 36.0–46.0)
Hemoglobin: 12.4 g/dL (ref 12.0–15.0)
Immature Granulocytes: 0 %
Lymphocytes Relative: 32 %
Lymphs Abs: 1.6 10*3/uL (ref 0.7–4.0)
MCH: 35 pg — ABNORMAL HIGH (ref 26.0–34.0)
MCHC: 31.6 g/dL (ref 30.0–36.0)
MCV: 110.7 fL — ABNORMAL HIGH (ref 80.0–100.0)
Monocytes Absolute: 0.7 10*3/uL (ref 0.1–1.0)
Monocytes Relative: 13 %
Neutro Abs: 2.5 10*3/uL (ref 1.7–7.7)
Neutrophils Relative %: 50 %
Platelet Count: 220 10*3/uL (ref 150–400)
RBC: 3.54 MIL/uL — ABNORMAL LOW (ref 3.87–5.11)
RDW: 13.7 % (ref 11.5–15.5)
WBC Count: 5 10*3/uL (ref 4.0–10.5)
nRBC: 0 % (ref 0.0–0.2)

## 2021-03-29 LAB — CEA (IN HOUSE-CHCC): CEA (CHCC-In House): 1.16 ng/mL (ref 0.00–5.00)

## 2021-03-29 MED ORDER — SODIUM CHLORIDE 0.9 % IV SOLN
100.0000 mg/m2 | Freq: Once | INTRAVENOUS | Status: AC
  Administered 2021-03-29: 160 mg via INTRAVENOUS
  Filled 2021-03-29: qty 8

## 2021-03-29 MED ORDER — ATROPINE SULFATE 1 MG/ML IJ SOLN
0.5000 mg | Freq: Once | INTRAMUSCULAR | Status: AC | PRN
  Administered 2021-03-29: 0.5 mg via INTRAVENOUS
  Filled 2021-03-29: qty 1

## 2021-03-29 MED ORDER — DEXTROSE 5 % IV SOLN
Freq: Once | INTRAVENOUS | Status: DC
  Filled 2021-03-29: qty 250

## 2021-03-29 MED ORDER — SODIUM CHLORIDE 0.9 % IV SOLN
450.0000 mg | Freq: Once | INTRAVENOUS | Status: AC
  Administered 2021-03-29: 450 mg via INTRAVENOUS
  Filled 2021-03-29: qty 4

## 2021-03-29 MED ORDER — SODIUM CHLORIDE 0.9 % IV SOLN
1600.0000 mg/m2 | INTRAVENOUS | Status: DC
  Administered 2021-03-29: 2650 mg via INTRAVENOUS
  Filled 2021-03-29: qty 53

## 2021-03-29 MED ORDER — SODIUM CHLORIDE 0.9 % IV SOLN
150.0000 mg | Freq: Once | INTRAVENOUS | Status: AC
  Administered 2021-03-29: 150 mg via INTRAVENOUS
  Filled 2021-03-29: qty 150

## 2021-03-29 MED ORDER — SODIUM CHLORIDE 0.9% FLUSH
10.0000 mL | INTRAVENOUS | Status: DC | PRN
  Administered 2021-03-29: 10 mL
  Filled 2021-03-29: qty 10

## 2021-03-29 MED ORDER — SODIUM CHLORIDE 0.9 % IV SOLN
400.0000 mg/m2 | Freq: Once | INTRAVENOUS | Status: AC
  Administered 2021-03-29: 660 mg via INTRAVENOUS
  Filled 2021-03-29: qty 33

## 2021-03-29 MED ORDER — SODIUM CHLORIDE 0.9 % IV SOLN
10.0000 mg | Freq: Once | INTRAVENOUS | Status: AC
  Administered 2021-03-29: 10 mg via INTRAVENOUS
  Filled 2021-03-29: qty 10

## 2021-03-29 MED ORDER — SODIUM CHLORIDE 0.9 % IV SOLN
Freq: Once | INTRAVENOUS | Status: AC
Start: 2021-03-29 — End: 2021-03-29
  Filled 2021-03-29: qty 250

## 2021-03-29 MED ORDER — PALONOSETRON HCL INJECTION 0.25 MG/5ML
0.2500 mg | Freq: Once | INTRAVENOUS | Status: AC
Start: 2021-03-29 — End: 2021-03-29
  Administered 2021-03-29: 0.25 mg via INTRAVENOUS
  Filled 2021-03-29: qty 5

## 2021-03-29 NOTE — Progress Notes (Signed)
[facility] OFFICE PROGRESS NOTE   Diagnosis: Colon cancer  INTERVAL HISTORY:   Kathy Robinson returns as scheduled.  She completed cycle 15 FOLFIRI/Avastin 03/07/2021.  She denies nausea/vomiting.  No mouth sores.  No diarrhea.  Energy level is better.  She notes less neuropathy symptoms.  She has a good appetite.  Objective:  Vital signs in last 24 hours:  Blood pressure 139/69, pulse 82, temperature 97.8 F (36.6 C), temperature source Oral, resp. rate 20, height $RemoveBe'5\' 6"'lzSPsivsK$  (1.676 m), weight 140 lb (63.5 kg), last menstrual period 11/19/1996, SpO2 98 %.    HEENT: No thrush or ulcers. Resp: Lungs clear bilaterally. Cardio: Regular rate and rhythm. GI: Abdomen soft and nontender.  No hepatomegaly. Vascular: No leg edema.  Calves soft and nontender.  Skin: Palms without erythema. Port-A-Cath without erythema.   Lab Results:  Lab Results  Component Value Date   WBC 5.0 03/29/2021   HGB 12.4 03/29/2021   HCT 39.2 03/29/2021   MCV 110.7 (H) 03/29/2021   PLT 220 03/29/2021   NEUTROABS 2.5 03/29/2021    Imaging:  No results found.  Medications: I have reviewed the patient's current medications.  Assessment/Plan: 1. Adenocarcinoma the left colon, stage IIIc (pT4b,pN2a), status post a left colectomy 05/30/2020, MSS, TMB 13, BRAF V600E ? Tumor invades the visceral peritoneum, lymphovascular and perineural invasion present, for tumor deposits, 7/13 lymph nodes, negative resection margins, no loss of mismatch repair protein expression ? CT abdomen/pelvis 05/26/2020-long segment of masslike thickening of the descending colon with associated high-grade colonic obstruction, small colonic wall defect with evidence of a focally contained microperforation, retroperitoneal adenopathy, indeterminate small hypodense liver lesions ? Elevated preoperative CEA ? PET8/02/2020-hypermetabolic liver metastases, periportal and periaortic adenopathy, hypermetabolic mediastinal and left  supraclavicular nodes ? Cycle 1 FOLFOX 07/05/20  ? Cycle 2 FOLFOXIRI (dose-reduced 5FU, no bolus) and Avastin 07/19/20 ? Cycle 3 FOLFOXIRI (Dose reduced 5-FU, no bolus)/Avastin 08/02/2020 ? Cycle 4 FOLFOXIRI/Avastin 08/16/2020 (Udenyca added) ? Cycle 5 FOLFOXIRI/Avastin 08/30/2020 ? CTs 09/09/2020-diminished size of lymph nodes in the chest, abdomen, and pelvis. Decreased hepatic metastases. No new evidence of disease progression, fat density lesion surrounding the left hemicolectomy ? Cycle 6 FOLFOXIRI/Avastin 09/13/2020 ? Cycle 7 FOLFOX/Avastin 09/27/2020 (irinotecan held) ? Cycle 8 FOLFOXIRI/Avastin 10/18/2020 ? Cycle 9 FOLFOXIRI/Avastin 11/01/2020 ? Cycle 10 FOLFOXIRI/Avastin 11/22/2020 (oxaliplatin held, irinotecan and 5-FU dose reduced) ? Cycle 11 FOLFOXIRI/Avastin 12/12/2020 (oxaliplatin held) ? CTs 12/19/2020-decrease in size of liver metastases, no evidence of disease progression ? Cycle 12 FOLFOXIRI/Avastin 01/04/2021 (oxaliplatin held) ? Cycle 13 FOLFOXIRI/Avastin 01/24/2021 (oxaliplatin held) ? Cycle 14 FOLFOXIRI/Avastin 02/14/2021 (oxaliplatin held) ? Cycle 15 FOLFOXIRI/Avastin 03/07/2021 (oxaliplatin held) ? Cycle 16 FOLFOXIRI/Avastin 03/29/2021 (oxaliplatin held) 2. Atrial fibrillation with rapid ventricular response 05/26/2020 3. Mild neutropenia following cycle 3 FOLFOXIRI, Udenyca added with cycle 4 4. Hypokalemia secondary to diarrhea-potassium supplementation starting 09/13/2020 5. Oxaliplatin neuropathy-moderate loss of vibratory sense on exam 11/22/2020, oxaliplatin held with cycle 10, cycle 11, 12, 13 chemotherapy, improved    Disposition: Kathy Robinson appears well.  She has completed 16 cycles of FOLFOXIRI/Avastin, oxaliplatin has been held since cycle 10 due to neuropathy.  There is no clinical evidence of disease progression.  Plan to proceed with FOLFIRI/Avastin today as scheduled, continue to hold oxaliplatin.  She will have restaging CT scans prior to her next visit.  We  reviewed the CBC and chemistry panel from today.  Labs adequate to proceed with treatment.  She will return for lab, follow-up, treatment on 04/18/2021.  She will  contact the office in the interim with any problems.    Ned Card ANP/GNP-BC   03/29/2021  9:22 AM

## 2021-03-29 NOTE — Patient Instructions (Signed)

## 2021-03-29 NOTE — Patient Instructions (Signed)
[facility]  Discharge Instructions: Thank you for choosing [facility] to provide your oncology and hematology care.   If you have a lab appointment with the [facility], please go directly to the [facility] and check in at the registration area.   Wear comfortable clothing and clothing appropriate for easy access to any Portacath or PICC line.   We strive to give you quality time with your provider. You may need to reschedule your appointment if you arrive late (15 or more minutes).  Arriving late affects you and other patients whose appointments are after yours.  Also, if you miss three or more appointments without notifying the office, you may be dismissed from the clinic at the provider's discretion.      For prescription refill requests, have your pharmacy contact our office and allow 72 hours for refills to be completed.    Today you received the following chemotherapy and/or immunotherapy agents: bevacizumab, irinotecan, leucovorin, fluorouracil.    To help prevent nausea and vomiting after your treatment, we encourage you to take your nausea medication as directed.  BELOW ARE SYMPTOMS THAT SHOULD BE REPORTED IMMEDIATELY: . *FEVER GREATER THAN 100.4 F (38 C) OR HIGHER . *CHILLS OR SWEATING . *NAUSEA AND VOMITING THAT IS NOT CONTROLLED WITH YOUR NAUSEA MEDICATION . *UNUSUAL SHORTNESS OF BREATH . *UNUSUAL BRUISING OR BLEEDING . *URINARY PROBLEMS (pain or burning when urinating, or frequent urination) . *BOWEL PROBLEMS (unusual diarrhea, constipation, pain near the anus) . TENDERNESS IN MOUTH AND THROAT WITH OR WITHOUT PRESENCE OF ULCERS (sore throat, sores in mouth, or a toothache) . UNUSUAL RASH, SWELLING OR PAIN  . UNUSUAL VAGINAL DISCHARGE OR ITCHING   Items with * indicate a potential emergency and should be followed up as soon as possible or go to the Emergency Department if any problems should occur.  Please show the  CHEMOTHERAPY ALERT CARD or IMMUNOTHERAPY ALERT CARD at check-in to the Emergency Department and triage nurse.  Should you have questions after your visit or need to cancel or reschedule your appointment, please contact [facility]  Dept: [phone]  and follow the prompts.  Office hours are 8:00 a.m. to 4:30 p.m. Monday - Friday. Please note that voicemails left after 4:00 p.m. may not be returned until the following business day.  We are closed weekends and major holidays. You have access to a nurse at all times for urgent questions. Please call the main number to the clinic Dept: [phone] and follow the prompts.   For any non-urgent questions, you may also contact your provider using MyChart. We now offer e-Visits for anyone 71 and older to request care online for non-urgent symptoms. For details visit [email or web address].   Also download the MyChart app! Go to the app store, search "MyChart", open the app, select [facility], and log in with your MyChart username and password.  Due to Covid, a mask is required upon entering the hospital/clinic. If you do not have a mask, one will be given to you upon arrival. For doctor visits, patients may have 1 support person aged 82 or older with them. For treatment visits, patients cannot have anyone with them due to current Covid guidelines and our immunocompromised population.

## 2021-03-29 NOTE — Progress Notes (Signed)
Per Ned Card, NP ok to proceed with treatment with urine protein from 03/07/21.

## 2021-03-30 ENCOUNTER — Telehealth: Payer: Self-pay | Admitting: *Deleted

## 2021-03-30 NOTE — Telephone Encounter (Signed)
Called patient with her CT scan on 5/26 at 0930 w/0915 arrival. NPO 4 hours prior and oral contrast at 0730 and 0830. She prefers to have peripheral stick for IV contrast. Notified managed care for PA

## 2021-03-31 ENCOUNTER — Other Ambulatory Visit: Payer: Self-pay

## 2021-03-31 ENCOUNTER — Inpatient Hospital Stay: Payer: Medicare Other

## 2021-03-31 VITALS — BP 124/65 | HR 77 | Temp 97.9°F | Resp 18

## 2021-03-31 DIAGNOSIS — E876 Hypokalemia: Secondary | ICD-10-CM | POA: Diagnosis not present

## 2021-03-31 DIAGNOSIS — I4891 Unspecified atrial fibrillation: Secondary | ICD-10-CM | POA: Diagnosis not present

## 2021-03-31 DIAGNOSIS — C185 Malignant neoplasm of splenic flexure: Secondary | ICD-10-CM

## 2021-03-31 DIAGNOSIS — G629 Polyneuropathy, unspecified: Secondary | ICD-10-CM | POA: Diagnosis not present

## 2021-03-31 DIAGNOSIS — C787 Secondary malignant neoplasm of liver and intrahepatic bile duct: Secondary | ICD-10-CM | POA: Diagnosis not present

## 2021-03-31 DIAGNOSIS — D709 Neutropenia, unspecified: Secondary | ICD-10-CM | POA: Diagnosis not present

## 2021-03-31 MED ORDER — PEGFILGRASTIM-CBQV 6 MG/0.6ML ~~LOC~~ SOSY
6.0000 mg | PREFILLED_SYRINGE | Freq: Once | SUBCUTANEOUS | Status: AC
  Administered 2021-03-31: 6 mg via SUBCUTANEOUS

## 2021-03-31 MED ORDER — SODIUM CHLORIDE 0.9% FLUSH
10.0000 mL | INTRAVENOUS | Status: DC | PRN
  Administered 2021-03-31: 10 mL
  Filled 2021-03-31: qty 10

## 2021-03-31 MED ORDER — HEPARIN SOD (PORK) LOCK FLUSH 100 UNIT/ML IV SOLN
500.0000 [IU] | Freq: Once | INTRAVENOUS | Status: AC | PRN
  Administered 2021-03-31: 500 [IU]
  Filled 2021-03-31: qty 5

## 2021-03-31 NOTE — Progress Notes (Signed)
Cecilie Kicks returns today for port de access and flush after 46 hr continous infusion of 78fu. Tolerated infusion without problems. Portacath located right chest wall was  deaccessed and flushed with 60ml NS and 500U/8ml Heparin and needle removed intact.  Procedure without incident. Patient tolerated procedure well.  Cecilie Kicks presents today for injection per the provider's orders.  udencya in left arm administration without incident; injection site WNL; see MAR for injection details.  Patient tolerated procedure well and without incident.  No questions or complaints noted at this time. AVS reviewed.  Stable during and after procedures.  Vital sings stable.  Discharged in stable condition ambulatory with cane.

## 2021-03-31 NOTE — Patient Instructions (Signed)
[facility]  Discharge Instructions: Thank you for choosing [facility] to provide your oncology and hematology care.   If you have a lab appointment with the [facility], please go directly to the [facility] and check in at the registration area.   Wear comfortable clothing and clothing appropriate for easy access to any Portacath or PICC line.   We strive to give you quality time with your provider. You may need to reschedule your appointment if you arrive late (15 or more minutes).  Arriving late affects you and other patients whose appointments are after yours.  Also, if you miss three or more appointments without notifying the office, you may be dismissed from the clinic at the provider's discretion.      For prescription refill requests, have your pharmacy contact our office and allow 72 hours for refills to be completed.    23fu pump removed.  Udencya given.    To help prevent nausea and vomiting after your treatment, we encourage you to take your nausea medication as directed.  BELOW ARE SYMPTOMS THAT SHOULD BE REPORTED IMMEDIATELY: . *FEVER GREATER THAN 100.4 F (38 C) OR HIGHER . *CHILLS OR SWEATING . *NAUSEA AND VOMITING THAT IS NOT CONTROLLED WITH YOUR NAUSEA MEDICATION . *UNUSUAL SHORTNESS OF BREATH . *UNUSUAL BRUISING OR BLEEDING . *URINARY PROBLEMS (pain or burning when urinating, or frequent urination) . *BOWEL PROBLEMS (unusual diarrhea, constipation, pain near the anus) . TENDERNESS IN MOUTH AND THROAT WITH OR WITHOUT PRESENCE OF ULCERS (sore throat, sores in mouth, or a toothache) . UNUSUAL RASH, SWELLING OR PAIN  . UNUSUAL VAGINAL DISCHARGE OR ITCHING   Items with * indicate a potential emergency and should be followed up as soon as possible or go to the Emergency Department if any problems should occur.  Please show the CHEMOTHERAPY ALERT CARD or IMMUNOTHERAPY ALERT CARD at check-in to the Emergency Department and triage  nurse.  Should you have questions after your visit or need to cancel or reschedule your appointment, please contact [facility]  Dept: [phone]  and follow the prompts.  Office hours are 8:00 a.m. to 4:30 p.m. Monday - Friday. Please note that voicemails left after 4:00 p.m. may not be returned until the following business day.  We are closed weekends and major holidays. You have access to a nurse at all times for urgent questions. Please call the main number to the clinic Dept: [phone] and follow the prompts.   For any non-urgent questions, you may also contact your provider using MyChart. We now offer e-Visits for anyone 52 and older to request care online for non-urgent symptoms. For details visit [email or web address].   Also download the MyChart app! Go to the app store, search "MyChart", open the app, select [facility], and log in with your MyChart username and password.  Due to Covid, a mask is required upon entering the hospital/clinic. If you do not have a mask, one will be given to you upon arrival. For doctor visits, patients may have 1 support person aged 2 or older with them. For treatment visits, patients cannot have anyone with them due to current Covid guidelines and our immunocompromised population.

## 2021-04-04 DIAGNOSIS — C185 Malignant neoplasm of splenic flexure: Secondary | ICD-10-CM | POA: Diagnosis not present

## 2021-04-13 ENCOUNTER — Ambulatory Visit (HOSPITAL_COMMUNITY)
Admission: RE | Admit: 2021-04-13 | Discharge: 2021-04-13 | Disposition: A | Discharge status: Home / Self Care | Payer: Medicare Other | Source: Ambulatory Visit | Attending: Nurse Practitioner | Admitting: Nurse Practitioner

## 2021-04-13 ENCOUNTER — Other Ambulatory Visit: Payer: Self-pay

## 2021-04-13 DIAGNOSIS — C185 Malignant neoplasm of splenic flexure: Secondary | ICD-10-CM | POA: Diagnosis not present

## 2021-04-13 DIAGNOSIS — C189 Malignant neoplasm of colon, unspecified: Secondary | ICD-10-CM | POA: Diagnosis not present

## 2021-04-13 DIAGNOSIS — R918 Other nonspecific abnormal finding of lung field: Secondary | ICD-10-CM | POA: Diagnosis not present

## 2021-04-13 MED ORDER — SODIUM CHLORIDE (PF) 0.9 % IJ SOLN
INTRAMUSCULAR | Status: AC
  Filled 2021-04-13: qty 50

## 2021-04-13 MED ORDER — IOHEXOL 300 MG/ML  SOLN
100.0000 mL | Freq: Once | INTRAMUSCULAR | Status: AC | PRN
  Administered 2021-04-13: 100 mL via INTRAVENOUS

## 2021-04-16 ENCOUNTER — Other Ambulatory Visit: Payer: Self-pay | Admitting: Oncology

## 2021-04-18 ENCOUNTER — Other Ambulatory Visit: Payer: Self-pay

## 2021-04-18 ENCOUNTER — Inpatient Hospital Stay: Payer: Medicare Other

## 2021-04-18 ENCOUNTER — Inpatient Hospital Stay (HOSPITAL_BASED_OUTPATIENT_CLINIC_OR_DEPARTMENT_OTHER): Payer: Medicare Other | Admitting: Nurse Practitioner

## 2021-04-18 ENCOUNTER — Encounter: Payer: Self-pay | Admitting: Nurse Practitioner

## 2021-04-18 VITALS — BP 135/67 | HR 83 | Temp 98.1°F | Resp 19 | Ht 66.0 in | Wt 140.6 lb

## 2021-04-18 DIAGNOSIS — C185 Malignant neoplasm of splenic flexure: Secondary | ICD-10-CM

## 2021-04-18 DIAGNOSIS — C787 Secondary malignant neoplasm of liver and intrahepatic bile duct: Secondary | ICD-10-CM | POA: Diagnosis not present

## 2021-04-18 DIAGNOSIS — D709 Neutropenia, unspecified: Secondary | ICD-10-CM | POA: Diagnosis not present

## 2021-04-18 DIAGNOSIS — Z95828 Presence of other vascular implants and grafts: Secondary | ICD-10-CM

## 2021-04-18 DIAGNOSIS — I4891 Unspecified atrial fibrillation: Secondary | ICD-10-CM | POA: Diagnosis not present

## 2021-04-18 DIAGNOSIS — E876 Hypokalemia: Secondary | ICD-10-CM | POA: Diagnosis not present

## 2021-04-18 DIAGNOSIS — G629 Polyneuropathy, unspecified: Secondary | ICD-10-CM | POA: Diagnosis not present

## 2021-04-18 LAB — CMP (CANCER CENTER ONLY)
ALT: 31 U/L (ref 0–44)
AST: 23 U/L (ref 15–41)
Albumin: 3.8 g/dL (ref 3.5–5.0)
Alkaline Phosphatase: 111 U/L (ref 38–126)
Anion gap: 7 (ref 5–15)
BUN: 22 mg/dL (ref 8–23)
CO2: 26 mmol/L (ref 22–32)
Calcium: 8.6 mg/dL — ABNORMAL LOW (ref 8.9–10.3)
Chloride: 105 mmol/L (ref 98–111)
Creatinine: 0.81 mg/dL (ref 0.44–1.00)
GFR, Estimated: >60 mL/min (ref >60)
Glucose, Bld: 85 mg/dL (ref 70–99)
Potassium: 4.6 mmol/L (ref 3.5–5.1)
Sodium: 138 mmol/L (ref 135–145)
Total Bilirubin: 0.3 mg/dL (ref 0.3–1.2)
Total Protein: 6.3 g/dL — ABNORMAL LOW (ref 6.5–8.1)

## 2021-04-18 LAB — CBC WITH DIFFERENTIAL (CANCER CENTER ONLY)
Abs Immature Granulocytes: 0.01 10*3/uL (ref 0.00–0.07)
Basophils Absolute: 0 10*3/uL (ref 0.0–0.1)
Basophils Relative: 1 %
Eosinophils Absolute: 0.1 10*3/uL (ref 0.0–0.5)
Eosinophils Relative: 2 %
HCT: 40.8 % (ref 36.0–46.0)
Hemoglobin: 13.3 g/dL (ref 12.0–15.0)
Immature Granulocytes: 0 %
Lymphocytes Relative: 22 %
Lymphs Abs: 1.3 10*3/uL (ref 0.7–4.0)
MCH: 35.8 pg — ABNORMAL HIGH (ref 26.0–34.0)
MCHC: 32.6 g/dL (ref 30.0–36.0)
MCV: 109.7 fL — ABNORMAL HIGH (ref 80.0–100.0)
Monocytes Absolute: 0.7 10*3/uL (ref 0.1–1.0)
Monocytes Relative: 12 %
Neutro Abs: 3.7 10*3/uL (ref 1.7–7.7)
Neutrophils Relative %: 63 %
Platelet Count: 220 10*3/uL (ref 150–400)
RBC: 3.72 MIL/uL — ABNORMAL LOW (ref 3.87–5.11)
RDW: 13.9 % (ref 11.5–15.5)
WBC Count: 5.9 10*3/uL (ref 4.0–10.5)
nRBC: 0 % (ref 0.0–0.2)

## 2021-04-18 LAB — TOTAL PROTEIN, URINE DIPSTICK: Protein, ur: NEGATIVE mg/dL

## 2021-04-18 MED ORDER — HEPARIN SOD (PORK) LOCK FLUSH 100 UNIT/ML IV SOLN
500.0000 [IU] | Freq: Once | INTRAVENOUS | Status: AC
  Administered 2021-04-18: 500 [IU]
  Filled 2021-04-18: qty 5

## 2021-04-18 MED ORDER — SODIUM CHLORIDE 0.9% FLUSH
10.0000 mL | Freq: Once | INTRAVENOUS | Status: AC
  Administered 2021-04-18: 10 mL
  Filled 2021-04-18: qty 10

## 2021-04-18 NOTE — Progress Notes (Addendum)
[facility] OFFICE PROGRESS NOTE   Diagnosis: Colon cancer  INTERVAL HISTORY:   Kathy Robinson returns as scheduled.  She completed cycle 16 FOLFIRI/Avastin 03/29/2021.  She denies nausea/vomiting.  No mouth sores.  No diarrhea.  Neuropathy symptoms continue to improve.  She denies bleeding.  Objective:  Vital signs in last 24 hours:  Blood pressure 135/67, pulse 83, temperature 98.1 F (36.7 C), temperature source Oral, resp. rate 19, height $RemoveBe'5\' 6"'QlPErlldm$  (1.676 m), weight 140 lb 9.6 oz (63.8 kg), last menstrual period 11/19/1996, SpO2 100 %.    HEENT: No thrush or ulcers. Resp: Lungs clear bilaterally. Cardio: Regular rate and rhythm. GI: Abdomen soft and nontender.  No hepatomegaly. Vascular: No leg edema. Skin: Palms without erythema. Port-A-Cath without erythema.   Lab Results:  Lab Results  Component Value Date   WBC 5.9 04/18/2021   HGB 13.3 04/18/2021   HCT 40.8 04/18/2021   MCV 109.7 (H) 04/18/2021   PLT 220 04/18/2021   NEUTROABS 3.7 04/18/2021    Imaging:  No results found.  Medications: I have reviewed the patient's current medications.  Assessment/Plan: 1. Adenocarcinoma the left colon, stage IIIc (pT4b,pN2a), status post a left colectomy 05/30/2020, MSS, TMB 13, BRAF V600E ? Tumor invades the visceral peritoneum, lymphovascular and perineural invasion present, for tumor deposits, 7/13 lymph nodes, negative resection margins, no loss of mismatch repair protein expression ? CT abdomen/pelvis 05/26/2020-long segment of masslike thickening of the descending colon with associated high-grade colonic obstruction, small colonic wall defect with evidence of a focally contained microperforation, retroperitoneal adenopathy, indeterminate small hypodense liver lesions ? Elevated preoperative CEA ? PET8/02/2020-hypermetabolic liver metastases, periportal and periaortic adenopathy, hypermetabolic mediastinal and left supraclavicular nodes ? Cycle 1 FOLFOX 07/05/20   ? Cycle 2 FOLFOXIRI (dose-reduced 5FU, no bolus) and Avastin 07/19/20 ? Cycle 3 FOLFOXIRI (Dose reduced 5-FU, no bolus)/Avastin 08/02/2020 ? Cycle 4 FOLFOXIRI/Avastin 08/16/2020 (Udenyca added) ? Cycle 5 FOLFOXIRI/Avastin 08/30/2020 ? CTs 09/09/2020-diminished size of lymph nodes in the chest, abdomen, and pelvis. Decreased hepatic metastases. No new evidence of disease progression, fat density lesion surrounding the left hemicolectomy ? Cycle 6 FOLFOXIRI/Avastin 09/13/2020 ? Cycle 7 FOLFOX/Avastin 09/27/2020 (irinotecan held) ? Cycle 8 FOLFOXIRI/Avastin 10/18/2020 ? Cycle 9 FOLFOXIRI/Avastin 11/01/2020 ? Cycle 10 FOLFOXIRI/Avastin 11/22/2020 (oxaliplatin held, irinotecan and 5-FU dose reduced) ? Cycle 11 FOLFOXIRI/Avastin 12/12/2020 (oxaliplatin held) ? CTs 12/19/2020-decrease in size of liver metastases, no evidence of disease progression ? Cycle 12 FOLFOXIRI/Avastin 01/04/2021 (oxaliplatin held) ? Cycle 13 FOLFOXIRI/Avastin 01/24/2021 (oxaliplatin held) ? Cycle 14 FOLFOXIRI/Avastin 02/14/2021 (oxaliplatin held) ? Cycle 15 FOLFOXIRI/Avastin 03/07/2021 (oxaliplatin held) ? Cycle 16 FOLFOXIRI/Avastin 03/29/2021 (oxaliplatin held) ? CTs 04/14/2021- decreased size of liver metastases.  No new or progressive findings. ? Cycle 17 FOLFOXIRI/Avastin 04/19/2021 (oxaliplatin held) 2. Atrial fibrillation with rapid ventricular response 05/26/2020 3. Mild neutropenia following cycle 3 FOLFOXIRI, Udenyca added with cycle 4 4. Hypokalemia secondary to diarrhea-potassium supplementation starting 09/13/2020 5. 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  She has completed 16 cycles of FOLFOXIRI/Avastin.  Oxaliplatin has been on hold since cycle 10 due to neuropathy.  Recent restaging CTs show decreased size of liver metastases, no new or progressive disease.  Dr. Benay Spice  discussed continuation of FOLFIRI/Avastin on a 3-week schedule versus maintenance Xeloda with continuation of Avastin.  Kathy Robinson would like to continue with the current treatment of FOLFIRI/Avastin on a 3-week schedule.  She will  return for cycle 17 04/19/2021.  We reviewed the CBC from today.  Counts adequate to proceed with treatment as above.  She will return for lab, follow-up, chemotherapy in 3 weeks.  She will contact the office in the interim with any problems.  Patient seen with Dr. Benay Spice.   Ned Card ANP/GNP-BC   04/18/2021  11:38 AM  This was a shared visit with Ned Card.  Kathy Robinson appears stable.  The restaging CTs confirm a continued response to systemic therapy.  We discussed treatment options including continuation of FOLFIRI/Avastin or switching to a maintenance regimen.  She would like to continue the current treatment for now.  I was present for greater than 50% of today's visit.  I performed medical decision making.  Julieanne Manson, MD

## 2021-04-19 ENCOUNTER — Inpatient Hospital Stay: Payer: Medicare Other | Attending: Oncology

## 2021-04-19 VITALS — BP 126/67 | HR 75 | Temp 98.2°F | Resp 18

## 2021-04-19 DIAGNOSIS — Z5111 Encounter for antineoplastic chemotherapy: Secondary | ICD-10-CM | POA: Insufficient documentation

## 2021-04-19 DIAGNOSIS — L539 Erythematous condition, unspecified: Secondary | ICD-10-CM | POA: Diagnosis not present

## 2021-04-19 DIAGNOSIS — Z5189 Encounter for other specified aftercare: Secondary | ICD-10-CM | POA: Diagnosis not present

## 2021-04-19 DIAGNOSIS — C185 Malignant neoplasm of splenic flexure: Secondary | ICD-10-CM | POA: Insufficient documentation

## 2021-04-19 DIAGNOSIS — D709 Neutropenia, unspecified: Secondary | ICD-10-CM | POA: Diagnosis not present

## 2021-04-19 DIAGNOSIS — E876 Hypokalemia: Secondary | ICD-10-CM | POA: Insufficient documentation

## 2021-04-19 DIAGNOSIS — G620 Drug-induced polyneuropathy: Secondary | ICD-10-CM | POA: Diagnosis not present

## 2021-04-19 DIAGNOSIS — T451X5A Adverse effect of antineoplastic and immunosuppressive drugs, initial encounter: Secondary | ICD-10-CM | POA: Diagnosis not present

## 2021-04-19 DIAGNOSIS — I4891 Unspecified atrial fibrillation: Secondary | ICD-10-CM | POA: Insufficient documentation

## 2021-04-19 MED ORDER — SODIUM CHLORIDE 0.9 % IV SOLN
150.0000 mg | Freq: Once | INTRAVENOUS | Status: AC
  Administered 2021-04-19: 150 mg via INTRAVENOUS
  Filled 2021-04-19: qty 5

## 2021-04-19 MED ORDER — SODIUM CHLORIDE 0.9 % IV SOLN
450.0000 mg | Freq: Once | INTRAVENOUS | Status: AC
  Administered 2021-04-19: 450 mg via INTRAVENOUS
  Filled 2021-04-19: qty 16

## 2021-04-19 MED ORDER — SODIUM CHLORIDE 0.9 % IV SOLN
Freq: Once | INTRAVENOUS | Status: AC
Start: 2021-04-19 — End: 2021-04-19
  Filled 2021-04-19: qty 250

## 2021-04-19 MED ORDER — SODIUM CHLORIDE 0.9 % IV SOLN
400.0000 mg/m2 | Freq: Once | INTRAVENOUS | Status: AC
  Administered 2021-04-19: 660 mg via INTRAVENOUS
  Filled 2021-04-19: qty 33

## 2021-04-19 MED ORDER — PALONOSETRON HCL INJECTION 0.25 MG/5ML
0.2500 mg | Freq: Once | INTRAVENOUS | Status: AC
  Administered 2021-04-19: 0.25 mg via INTRAVENOUS
  Filled 2021-04-19: qty 5

## 2021-04-19 MED ORDER — SODIUM CHLORIDE 0.9 % IV SOLN
10.0000 mg | Freq: Once | INTRAVENOUS | Status: AC
  Administered 2021-04-19: 10 mg via INTRAVENOUS
  Filled 2021-04-19: qty 1

## 2021-04-19 MED ORDER — HEPARIN SOD (PORK) LOCK FLUSH 100 UNIT/ML IV SOLN
500.0000 [IU] | Freq: Once | INTRAVENOUS | Status: DC | PRN
  Filled 2021-04-19: qty 5

## 2021-04-19 MED ORDER — SODIUM CHLORIDE 0.9% FLUSH
10.0000 mL | INTRAVENOUS | Status: DC | PRN
  Administered 2021-04-19: 10 mL
  Filled 2021-04-19: qty 10

## 2021-04-19 MED ORDER — ATROPINE SULFATE 1 MG/ML IJ SOLN
0.5000 mg | Freq: Once | INTRAMUSCULAR | Status: AC | PRN
  Administered 2021-04-19: 0.5 mg via INTRAVENOUS
  Filled 2021-04-19: qty 1

## 2021-04-19 MED ORDER — SODIUM CHLORIDE 0.9 % IV SOLN
1600.0000 mg/m2 | INTRAVENOUS | Status: DC
  Administered 2021-04-19: 2650 mg via INTRAVENOUS
  Filled 2021-04-19: qty 53

## 2021-04-19 MED ORDER — SODIUM CHLORIDE 0.9 % IV SOLN
100.0000 mg/m2 | Freq: Once | INTRAVENOUS | Status: AC
  Administered 2021-04-19: 160 mg via INTRAVENOUS
  Filled 2021-04-19: qty 8

## 2021-04-19 NOTE — Patient Instructions (Signed)
Kathy Robinson  Discharge Instructions: Thank you for choosing [facility] to provide your oncology and hematology care.   If you have a lab appointment with the [facility], please go directly to the [facility] and check in at the registration area.   Wear comfortable clothing and clothing appropriate for easy access to any Portacath or PICC line.   We strive to give you quality time with your provider. You may need to reschedule your appointment if you arrive late (15 or more minutes).  Arriving late affects you and other patients whose appointments are after yours.  Also, if you miss three or more appointments without notifying the office, you may be dismissed from the clinic at the provider's discretion.      For prescription refill requests, have your pharmacy contact our office and allow 72 hours for refills to be completed.    Today you received the following chemotherapy and/or immunotherapy agents: bevacizumab, irinotecan, leucovorin, fluorouracil.    To help prevent nausea and vomiting after your treatment, we encourage you to take your nausea medication as directed.  BELOW ARE SYMPTOMS THAT SHOULD BE REPORTED IMMEDIATELY: . *FEVER GREATER THAN 100.4 F (38 C) OR HIGHER . *CHILLS OR SWEATING . *NAUSEA AND VOMITING THAT IS NOT CONTROLLED WITH YOUR NAUSEA MEDICATION . *UNUSUAL SHORTNESS OF BREATH . *UNUSUAL BRUISING OR BLEEDING . *URINARY PROBLEMS (pain or burning when urinating, or frequent urination) . *BOWEL PROBLEMS (unusual diarrhea, constipation, pain near the anus) . TENDERNESS IN MOUTH AND THROAT WITH OR WITHOUT PRESENCE OF ULCERS (sore throat, sores in mouth, or a toothache) . UNUSUAL RASH, SWELLING OR PAIN  . UNUSUAL VAGINAL DISCHARGE OR ITCHING   Items with * indicate a potential emergency and should be followed up as soon as possible or go to the Emergency Department if any problems should occur.  Please show the  CHEMOTHERAPY ALERT CARD or IMMUNOTHERAPY ALERT CARD at check-in to the Emergency Department and triage nurse.  Should you have questions after your visit or need to cancel or reschedule your appointment, please contact [facility]  Dept: [phone]  and follow the prompts.  Office hours are 8:00 a.m. to 4:30 p.m. Monday - Friday. Please note that voicemails left after 4:00 p.m. may not be returned until the following business day.  We are closed weekends and major holidays. You have access to a nurse at all times for urgent questions. Please call the main number to the clinic Dept: [phone] and follow the prompts.   For any non-urgent questions, you may also contact your provider using MyChart. We now offer e-Visits for anyone 13 and older to request care online for non-urgent symptoms. For details visit [email or web address].   Also download the MyChart app! Go to the app store, search "MyChart", open the app, select [facility], and log in with your MyChart username and password.  Due to Covid, a mask is required upon entering the hospital/clinic. If you do not have a mask, one will be given to you upon arrival. For doctor visits, patients may have 1 support person aged 47 or older with them. For treatment visits, patients cannot have anyone with them due to current Covid guidelines and our immunocompromised population.   Bevacizumab injection What is this medicine? BEVACIZUMAB (be va SIZ yoo mab) is a monoclonal antibody. It is used to treat many types of cancer. This medicine may be used for other purposes; ask your health care provider  or pharmacist if you have questions. COMMON BRAND NAME(S): Avastin, MVASI, Zirabev What should I tell my health care provider before I take this medicine? They need to know if you have any of these conditions:  diabetes  heart disease  high blood pressure  history of coughing up blood  prior anthracycline chemotherapy  (e.g., doxorubicin, daunorubicin, epirubicin)  recent or ongoing radiation therapy  recent or planning to have surgery  stroke  an unusual or allergic reaction to bevacizumab, hamster proteins, mouse proteins, other medicines, foods, dyes, or preservatives  pregnant or trying to get pregnant  breast-feeding How should I use this medicine? This medicine is for infusion into a vein. It is given by a health care professional in a hospital or clinic setting. Talk to your pediatrician regarding the use of this medicine in children. Special care may be needed. Overdosage: If you think you have taken too much of this medicine contact a poison control center or emergency room at once. NOTE: This medicine is only for you. Do not share this medicine with others. What if I miss a dose? It is important not to miss your dose. Call your doctor or health care professional if you are unable to keep an appointment. What may interact with this medicine? Interactions are not expected. This list may not describe all possible interactions. Give your health care provider a list of all the medicines, herbs, non-prescription drugs, or dietary supplements you use. Also tell them if you smoke, drink alcohol, or use illegal drugs. Some items may interact with your medicine. What should I watch for while using this medicine? Your condition will be monitored carefully while you are receiving this medicine. You will need important blood work and urine testing done while you are taking this medicine. This medicine may increase your risk to bruise or bleed. Call your doctor or health care professional if you notice any unusual bleeding. Before having surgery, talk to your health care provider to make sure it is ok. This drug can increase the risk of poor healing of your surgical site or wound. You will need to stop this drug for 28 days before surgery. After surgery, wait at least 28 days before restarting this drug. Make  sure the surgical site or wound is healed enough before restarting this drug. Talk to your health care provider if questions. Do not become pregnant while taking this medicine or for 6 months after stopping it. Women should inform their doctor if they wish to become pregnant or think they might be pregnant. There is a potential for serious side effects to an unborn child. Talk to your health care professional or pharmacist for more information. Do not breast-feed an infant while taking this medicine and for 6 months after the last dose. This medicine has caused ovarian failure in some women. This medicine may interfere with the ability to have a child. You should talk to your doctor or health care professional if you are concerned about your fertility. What side effects may I notice from receiving this medicine? Side effects that you should report to your doctor or health care professional as soon as possible:  allergic reactions like skin rash, itching or hives, swelling of the face, lips, or tongue  chest pain or chest tightness  chills  coughing up blood  high fever  seizures  severe constipation  signs and symptoms of bleeding such as bloody or black, tarry stools; red or dark-brown urine; spitting up blood or brown material that  looks like coffee grounds; red spots on the skin; unusual bruising or bleeding from the eye, gums, or nose  signs and symptoms of a blood clot such as breathing problems; chest pain; severe, sudden headache; pain, swelling, warmth in the leg  signs and symptoms of a stroke like changes in vision; confusion; trouble speaking or understanding; severe headaches; sudden numbness or weakness of the face, arm or leg; trouble walking; dizziness; loss of balance or coordination  stomach pain  sweating  swelling of legs or ankles  vomiting  weight gain Side effects that usually do not require medical attention (report to your doctor or health care professional  if they continue or are bothersome):  back pain  changes in taste  decreased appetite  dry skin  nausea  tiredness This list may not describe all possible side effects. Call your doctor for medical advice about side effects. You may report side effects to FDA at 1-800-FDA-1088. Where should I keep my medicine? This drug is given in a hospital or clinic and will not be stored at home. NOTE: This sheet is a summary. It may not cover all possible information. If you have questions about this medicine, talk to your doctor, pharmacist, or health care provider.  2021 Elsevier/Gold Standard (2019-09-02 10:50:46)  Irinotecan injection What is this medicine? IRINOTECAN (ir in oh TEE kan ) is a chemotherapy drug. It is used to treat colon and rectal cancer. This medicine may be used for other purposes; ask your health care provider or pharmacist if you have questions. COMMON BRAND NAME(S): Camptosar What should I tell my health care provider before I take this medicine? They need to know if you have any of these conditions:  dehydration  diarrhea  infection (especially a virus infection such as chickenpox, cold sores, or herpes)  liver disease  low blood counts, like low white cell, platelet, or red cell counts  low levels of calcium, magnesium, or potassium in the blood  recent or ongoing radiation therapy  an unusual or allergic reaction to irinotecan, other medicines, foods, dyes, or preservatives  pregnant or trying to get pregnant  breast-feeding How should I use this medicine? This drug is given as an infusion into a vein. It is administered in a hospital or clinic by a specially trained health care professional. Talk to your pediatrician regarding the use of this medicine in children. Special care may be needed. Overdosage: If you think you have taken too much of this medicine contact a poison control center or emergency room at once. NOTE: This medicine is only for  you. Do not share this medicine with others. What if I miss a dose? It is important not to miss your dose. Call your doctor or health care professional if you are unable to keep an appointment. What may interact with this medicine? Do not take this medicine with any of the following medications:  cobicistat  itraconazole This medicine may interact with the following medications:  antiviral medicines for HIV or AIDS  certain antibiotics like rifampin or rifabutin  certain medicines for fungal infections like ketoconazole, posaconazole, and voriconazole  certain medicines for seizures like carbamazepine, phenobarbital, phenotoin  clarithromycin  gemfibrozil  nefazodone  St. John's Wort This list may not describe all possible interactions. Give your health care provider a list of all the medicines, herbs, non-prescription drugs, or dietary supplements you use. Also tell them if you smoke, drink alcohol, or use illegal drugs. Some items may interact with your medicine. What  should I watch for while using this medicine? Your condition will be monitored carefully while you are receiving this medicine. You will need important blood work done while you are taking this medicine. This drug may make you feel generally unwell. This is not uncommon, as chemotherapy can affect healthy cells as well as cancer cells. Report any side effects. Continue your course of treatment even though you feel ill unless your doctor tells you to stop. In some cases, you may be given additional medicines to help with side effects. Follow all directions for their use. You may get drowsy or dizzy. Do not drive, use machinery, or do anything that needs mental alertness until you know how this medicine affects you. Do not stand or sit up quickly, especially if you are an older patient. This reduces the risk of dizzy or fainting spells. Call your health care professional for advice if you get a fever, chills, or sore  throat, or other symptoms of a cold or flu. Do not treat yourself. This medicine decreases your body's ability to fight infections. Try to avoid being around people who are sick. Avoid taking products that contain aspirin, acetaminophen, ibuprofen, naproxen, or ketoprofen unless instructed by your doctor. These medicines may hide a fever. This medicine may increase your risk to bruise or bleed. Call your doctor or health care professional if you notice any unusual bleeding. Be careful brushing and flossing your teeth or using a toothpick because you may get an infection or bleed more easily. If you have any dental work done, tell your dentist you are receiving this medicine. Do not become pregnant while taking this medicine or for 6 months after stopping it. Women should inform their health care professional if they wish to become pregnant or think they might be pregnant. Men should not father a child while taking this medicine and for 3 months after stopping it. There is potential for serious side effects to an unborn child. Talk to your health care professional for more information. Do not breast-feed an infant while taking this medicine or for 7 days after stopping it. This medicine has caused ovarian failure in some women. This medicine may make it more difficult to get pregnant. Talk to your health care professional if you are concerned about your fertility. This medicine has caused decreased sperm counts in some men. This may make it more difficult to father a child. Talk to your health care professional if you are concerned about your fertility. What side effects may I notice from receiving this medicine? Side effects that you should report to your doctor or health care professional as soon as possible:  allergic reactions like skin rash, itching or hives, swelling of the face, lips, or tongue  chest pain  diarrhea  flushing, runny nose, sweating during infusion  low blood counts - this  medicine may decrease the number of white blood cells, red blood cells and platelets. You may be at increased risk for infections and bleeding.  nausea, vomiting  pain, swelling, warmth in the leg  signs of decreased platelets or bleeding - bruising, pinpoint red spots on the skin, black, tarry stools, blood in the urine  signs of infection - fever or chills, cough, sore throat, pain or difficulty passing urine  signs of decreased red blood cells - unusually weak or tired, fainting spells, lightheadedness Side effects that usually do not require medical attention (report to your doctor or health care professional if they continue or are bothersome):  constipation  hair loss  headache  loss of appetite  mouth sores  stomach pain This list may not describe all possible side effects. Call your doctor for medical advice about side effects. You may report side effects to FDA at 1-800-FDA-1088. Where should I keep my medicine? This drug is given in a hospital or clinic and will not be stored at home. NOTE: This sheet is a summary. It may not cover all possible information. If you have questions about this medicine, talk to your doctor, pharmacist, or health care provider.  2021 Elsevier/Gold Standard (2019-10-06 17:46:13)  Leucovorin injection What is this medicine? LEUCOVORIN (loo koe VOR in) is used to prevent or treat the harmful effects of some medicines. This medicine is used to treat anemia caused by a low amount of folic acid in the body. It is also used with 5-fluorouracil (5-FU) to treat colon cancer. This medicine may be used for other purposes; ask your health care provider or pharmacist if you have questions. What should I tell my health care provider before I take this medicine? They need to know if you have any of these conditions:  anemia from low levels of vitamin B-12 in the blood  an unusual or allergic reaction to leucovorin, folic acid, other medicines, foods,  dyes, or preservatives  pregnant or trying to get pregnant  breast-feeding How should I use this medicine? This medicine is for injection into a muscle or into a vein. It is given by a health care professional in a hospital or clinic setting. Talk to your pediatrician regarding the use of this medicine in children. Special care may be needed. Overdosage: If you think you have taken too much of this medicine contact a poison control center or emergency room at once. NOTE: This medicine is only for you. Do not share this medicine with others. What if I miss a dose? This does not apply. What may interact with this medicine?  capecitabine  fluorouracil  phenobarbital  phenytoin  primidone  trimethoprim-sulfamethoxazole This list may not describe all possible interactions. Give your health care provider a list of all the medicines, herbs, non-prescription drugs, or dietary supplements you use. Also tell them if you smoke, drink alcohol, or use illegal drugs. Some items may interact with your medicine. What should I watch for while using this medicine? Your condition will be monitored carefully while you are receiving this medicine. This medicine may increase the side effects of 5-fluorouracil, 5-FU. Tell your doctor or health care professional if you have diarrhea or mouth sores that do not get better or that get worse. What side effects may I notice from receiving this medicine? Side effects that you should report to your doctor or health care professional as soon as possible:  allergic reactions like skin rash, itching or hives, swelling of the face, lips, or tongue  breathing problems  fever, infection  mouth sores  unusual bleeding or bruising  unusually weak or tired Side effects that usually do not require medical attention (report to your doctor or health care professional if they continue or are bothersome):  constipation or diarrhea  loss of appetite  nausea,  vomiting This list may not describe all possible side effects. Call your doctor for medical advice about side effects. You may report side effects to FDA at 1-800-FDA-1088. Where should I keep my medicine? This drug is given in a hospital or clinic and will not be stored at home. NOTE: This sheet is a summary. It may not cover  all possible information. If you have questions about this medicine, talk to your doctor, pharmacist, or health care provider.  2021 Elsevier/Gold Standard (2008-05-11 16:50:29)  Fluorouracil, 5-FU injection What is this medicine? FLUOROURACIL, 5-FU (flure oh YOOR a sil) is a chemotherapy drug. It slows the growth of cancer cells. This medicine is used to treat many types of cancer like breast cancer, colon or rectal cancer, pancreatic cancer, and stomach cancer. This medicine may be used for other purposes; ask your health care provider or pharmacist if you have questions. COMMON BRAND NAME(S): Adrucil What should I tell my health care provider before I take this medicine? They need to know if you have any of these conditions:  blood disorders  dihydropyrimidine dehydrogenase (DPD) deficiency  infection (especially a virus infection such as chickenpox, cold sores, or herpes)  kidney disease  liver disease  malnourished, poor nutrition  recent or ongoing radiation therapy  an unusual or allergic reaction to fluorouracil, other chemotherapy, other medicines, foods, dyes, or preservatives  pregnant or trying to get pregnant  breast-feeding How should I use this medicine? This drug is given as an infusion or injection into a vein. It is administered in a hospital or clinic by a specially trained health care professional. Talk to your pediatrician regarding the use of this medicine in children. Special care may be needed. Overdosage: If you think you have taken too much of this medicine contact a poison control center or emergency room at once. NOTE: This  medicine is only for you. Do not share this medicine with others. What if I miss a dose? It is important not to miss your dose. Call your doctor or health care professional if you are unable to keep an appointment. What may interact with this medicine? Do not take this medicine with any of the following medications:  live virus vaccines This medicine may also interact with the following medications:  medicines that treat or prevent blood clots like warfarin, enoxaparin, and dalteparin This list may not describe all possible interactions. Give your health care provider a list of all the medicines, herbs, non-prescription drugs, or dietary supplements you use. Also tell them if you smoke, drink alcohol, or use illegal drugs. Some items may interact with your medicine. What should I watch for while using this medicine? Visit your doctor for checks on your progress. This drug may make you feel generally unwell. This is not uncommon, as chemotherapy can affect healthy cells as well as cancer cells. Report any side effects. Continue your course of treatment even though you feel ill unless your doctor tells you to stop. In some cases, you may be given additional medicines to help with side effects. Follow all directions for their use. Call your doctor or health care professional for advice if you get a fever, chills or sore throat, or other symptoms of a cold or flu. Do not treat yourself. This drug decreases your body's ability to fight infections. Try to avoid being around people who are sick. This medicine may increase your risk to bruise or bleed. Call your doctor or health care professional if you notice any unusual bleeding. Be careful brushing and flossing your teeth or using a toothpick because you may get an infection or bleed more easily. If you have any dental work done, tell your dentist you are receiving this medicine. Avoid taking products that contain aspirin, acetaminophen, ibuprofen,  naproxen, or ketoprofen unless instructed by your doctor. These medicines may hide a fever. Do not  become pregnant while taking this medicine. Women should inform their doctor if they wish to become pregnant or think they might be pregnant. There is a potential for serious side effects to an unborn child. Talk to your health care professional or pharmacist for more information. Do not breast-feed an infant while taking this medicine. Men should inform their doctor if they wish to father a child. This medicine may lower sperm counts. Do not treat diarrhea with over the counter products. Contact your doctor if you have diarrhea that lasts more than 2 days or if it is severe and watery. This medicine can make you more sensitive to the sun. Keep out of the sun. If you cannot avoid being in the sun, wear protective clothing and use sunscreen. Do not use sun lamps or tanning beds/booths. What side effects may I notice from receiving this medicine? Side effects that you should report to your doctor or health care professional as soon as possible:  allergic reactions like skin rash, itching or hives, swelling of the face, lips, or tongue  low blood counts - this medicine may decrease the number of white blood cells, red blood cells and platelets. You may be at increased risk for infections and bleeding.  signs of infection - fever or chills, cough, sore throat, pain or difficulty passing urine  signs of decreased platelets or bleeding - bruising, pinpoint red spots on the skin, black, tarry stools, blood in the urine  signs of decreased red blood cells - unusually weak or tired, fainting spells, lightheadedness  breathing problems  changes in vision  chest pain  mouth sores  nausea and vomiting  pain, swelling, redness at site where injected  pain, tingling, numbness in the hands or feet  redness, swelling, or sores on hands or feet  stomach pain  unusual bleeding Side effects that  usually do not require medical attention (report to your doctor or health care professional if they continue or are bothersome):  changes in finger or toe nails  diarrhea  dry or itchy skin  hair loss  headache  loss of appetite  sensitivity of eyes to the light  stomach upset  unusually teary eyes This list may not describe all possible side effects. Call your doctor for medical advice about side effects. You may report side effects to FDA at 1-800-FDA-1088. Where should I keep my medicine? This drug is given in a hospital or clinic and will not be stored at home. NOTE: This sheet is a summary. It may not cover all possible information. If you have questions about this medicine, talk to your doctor, pharmacist, or health care provider.  2021 Elsevier/Gold Standard (2019-10-06 15:00:03)

## 2021-04-20 ENCOUNTER — Encounter: Payer: Self-pay | Admitting: Oncology

## 2021-04-21 ENCOUNTER — Other Ambulatory Visit: Payer: Self-pay

## 2021-04-21 ENCOUNTER — Inpatient Hospital Stay: Payer: Medicare Other

## 2021-04-21 ENCOUNTER — Ambulatory Visit: Payer: Medicare Other

## 2021-04-21 VITALS — BP 136/75 | HR 68 | Temp 98.2°F

## 2021-04-21 DIAGNOSIS — D709 Neutropenia, unspecified: Secondary | ICD-10-CM | POA: Diagnosis not present

## 2021-04-21 DIAGNOSIS — T451X5A Adverse effect of antineoplastic and immunosuppressive drugs, initial encounter: Secondary | ICD-10-CM | POA: Diagnosis not present

## 2021-04-21 DIAGNOSIS — Z5111 Encounter for antineoplastic chemotherapy: Secondary | ICD-10-CM | POA: Diagnosis not present

## 2021-04-21 DIAGNOSIS — C185 Malignant neoplasm of splenic flexure: Secondary | ICD-10-CM | POA: Diagnosis not present

## 2021-04-21 DIAGNOSIS — L539 Erythematous condition, unspecified: Secondary | ICD-10-CM | POA: Diagnosis not present

## 2021-04-21 DIAGNOSIS — I4891 Unspecified atrial fibrillation: Secondary | ICD-10-CM | POA: Diagnosis not present

## 2021-04-21 DIAGNOSIS — E876 Hypokalemia: Secondary | ICD-10-CM | POA: Diagnosis not present

## 2021-04-21 DIAGNOSIS — Z5189 Encounter for other specified aftercare: Secondary | ICD-10-CM | POA: Diagnosis not present

## 2021-04-21 DIAGNOSIS — G620 Drug-induced polyneuropathy: Secondary | ICD-10-CM | POA: Diagnosis not present

## 2021-04-21 MED ORDER — SODIUM CHLORIDE 0.9% FLUSH
10.0000 mL | INTRAVENOUS | Status: DC | PRN
  Administered 2021-04-21: 10 mL
  Filled 2021-04-21: qty 10

## 2021-04-21 MED ORDER — PEGFILGRASTIM-CBQV 6 MG/0.6ML ~~LOC~~ SOSY
6.0000 mg | PREFILLED_SYRINGE | Freq: Once | SUBCUTANEOUS | Status: AC
  Administered 2021-04-21: 6 mg via SUBCUTANEOUS

## 2021-04-21 MED ORDER — HEPARIN SOD (PORK) LOCK FLUSH 100 UNIT/ML IV SOLN
500.0000 [IU] | Freq: Once | INTRAVENOUS | Status: AC | PRN
  Administered 2021-04-21: 500 [IU]
  Filled 2021-04-21: qty 5

## 2021-04-21 NOTE — Patient Instructions (Signed)
Pegfilgrastim injection What is this medicine? PEGFILGRASTIM (PEG fil gra stim) is a long-acting granulocyte colony-stimulating factor that stimulates the growth of neutrophils, a type of white blood cell important in the body's fight against infection. It is used to reduce the incidence of fever and infection in patients with certain types of cancer who are receiving chemotherapy that affects the bone marrow, and to increase survival after being exposed to high doses of radiation. This medicine may be used for other purposes; ask your health care provider or pharmacist if you have questions. COMMON BRAND NAME(S): Fulphila, Neulasta, Nyvepria, UDENYCA, Ziextenzo What should I tell my health care provider before I take this medicine? They need to know if you have any of these conditions:  kidney disease  latex allergy  ongoing radiation therapy  sickle cell disease  skin reactions to acrylic adhesives (On-Body Injector only)  an unusual or allergic reaction to pegfilgrastim, filgrastim, other medicines, foods, dyes, or preservatives  pregnant or trying to get pregnant  breast-feeding How should I use this medicine? This medicine is for injection under the skin. If you get this medicine at home, you will be taught how to prepare and give the pre-filled syringe or how to use the On-body Injector. Refer to the patient Instructions for Use for detailed instructions. Use exactly as directed. Tell your healthcare provider immediately if you suspect that the On-body Injector may not have performed as intended or if you suspect the use of the On-body Injector resulted in a missed or partial dose. It is important that you put your used needles and syringes in a special sharps container. Do not put them in a trash can. If you do not have a sharps container, call your pharmacist or healthcare provider to get one. Talk to your pediatrician regarding the use of this medicine in children. While this drug  may be prescribed for selected conditions, precautions do apply. Overdosage: If you think you have taken too much of this medicine contact a poison control center or emergency room at once. NOTE: This medicine is only for you. Do not share this medicine with others. What if I miss a dose? It is important not to miss your dose. Call your doctor or health care professional if you miss your dose. If you miss a dose due to an On-body Injector failure or leakage, a new dose should be administered as soon as possible using a single prefilled syringe for manual use. What may interact with this medicine? Interactions have not been studied. This list may not describe all possible interactions. Give your health care provider a list of all the medicines, herbs, non-prescription drugs, or dietary supplements you use. Also tell them if you smoke, drink alcohol, or use illegal drugs. Some items may interact with your medicine. What should I watch for while using this medicine? Your condition will be monitored carefully while you are receiving this medicine. You may need blood work done while you are taking this medicine. Talk to your health care provider about your risk of cancer. You may be more at risk for certain types of cancer if you take this medicine. If you are going to need a MRI, CT scan, or other procedure, tell your doctor that you are using this medicine (On-Body Injector only). What side effects may I notice from receiving this medicine? Side effects that you should report to your doctor or health care professional as soon as possible:  allergic reactions (skin rash, itching or hives, swelling of   the face, lips, or tongue)  back pain  dizziness  fever  pain, redness, or irritation at site where injected  pinpoint red spots on the skin  red or dark-brown urine  shortness of breath or breathing problems  stomach or side pain, or pain at the shoulder  swelling  tiredness  trouble  passing urine or change in the amount of urine  unusual bruising or bleeding Side effects that usually do not require medical attention (report to your doctor or health care professional if they continue or are bothersome):  bone pain  muscle pain This list may not describe all possible side effects. Call your doctor for medical advice about side effects. You may report side effects to FDA at 1-800-FDA-1088. Where should I keep my medicine? Keep out of the reach of children. If you are using this medicine at home, you will be instructed on how to store it. Throw away any unused medicine after the expiration date on the label. NOTE: This sheet is a summary. It may not cover all possible information. If you have questions about this medicine, talk to your doctor, pharmacist, or health care provider.  2021 Elsevier/Gold Standard (2019-11-27 13:20:51)  

## 2021-04-29 ENCOUNTER — Other Ambulatory Visit (HOSPITAL_COMMUNITY): Payer: Self-pay

## 2021-05-05 DIAGNOSIS — C185 Malignant neoplasm of splenic flexure: Secondary | ICD-10-CM | POA: Diagnosis not present

## 2021-05-07 ENCOUNTER — Other Ambulatory Visit: Payer: Self-pay | Admitting: Oncology

## 2021-05-09 ENCOUNTER — Inpatient Hospital Stay: Payer: Medicare Other

## 2021-05-09 ENCOUNTER — Other Ambulatory Visit: Payer: Self-pay

## 2021-05-09 ENCOUNTER — Inpatient Hospital Stay (HOSPITAL_BASED_OUTPATIENT_CLINIC_OR_DEPARTMENT_OTHER): Payer: Medicare Other | Admitting: Oncology

## 2021-05-09 VITALS — BP 140/71 | HR 95 | Temp 97.8°F | Resp 20 | Ht 66.0 in | Wt 144.2 lb

## 2021-05-09 DIAGNOSIS — D709 Neutropenia, unspecified: Secondary | ICD-10-CM | POA: Diagnosis not present

## 2021-05-09 DIAGNOSIS — E876 Hypokalemia: Secondary | ICD-10-CM | POA: Diagnosis not present

## 2021-05-09 DIAGNOSIS — Z5189 Encounter for other specified aftercare: Secondary | ICD-10-CM | POA: Diagnosis not present

## 2021-05-09 DIAGNOSIS — L539 Erythematous condition, unspecified: Secondary | ICD-10-CM | POA: Diagnosis not present

## 2021-05-09 DIAGNOSIS — C185 Malignant neoplasm of splenic flexure: Secondary | ICD-10-CM

## 2021-05-09 DIAGNOSIS — Z5111 Encounter for antineoplastic chemotherapy: Secondary | ICD-10-CM | POA: Diagnosis not present

## 2021-05-09 DIAGNOSIS — G620 Drug-induced polyneuropathy: Secondary | ICD-10-CM | POA: Diagnosis not present

## 2021-05-09 DIAGNOSIS — T451X5A Adverse effect of antineoplastic and immunosuppressive drugs, initial encounter: Secondary | ICD-10-CM | POA: Diagnosis not present

## 2021-05-09 DIAGNOSIS — I4891 Unspecified atrial fibrillation: Secondary | ICD-10-CM | POA: Diagnosis not present

## 2021-05-09 LAB — CMP (CANCER CENTER ONLY)
ALT: 33 U/L (ref 0–44)
AST: 27 U/L (ref 15–41)
Albumin: 4 g/dL (ref 3.5–5.0)
Alkaline Phosphatase: 95 U/L (ref 38–126)
Anion gap: 7 (ref 5–15)
BUN: 24 mg/dL — ABNORMAL HIGH (ref 8–23)
CO2: 26 mmol/L (ref 22–32)
Calcium: 9.3 mg/dL (ref 8.9–10.3)
Chloride: 105 mmol/L (ref 98–111)
Creatinine: 0.79 mg/dL (ref 0.44–1.00)
GFR, Estimated: >60 mL/min (ref >60)
Glucose, Bld: 85 mg/dL (ref 70–99)
Potassium: 4.4 mmol/L (ref 3.5–5.1)
Sodium: 138 mmol/L (ref 135–145)
Total Bilirubin: 0.4 mg/dL (ref 0.3–1.2)
Total Protein: 6.5 g/dL (ref 6.5–8.1)

## 2021-05-09 LAB — CBC WITH DIFFERENTIAL (CANCER CENTER ONLY)
Abs Immature Granulocytes: 0.01 10*3/uL (ref 0.00–0.07)
Basophils Absolute: 0.1 10*3/uL (ref 0.0–0.1)
Basophils Relative: 1 %
Eosinophils Absolute: 0.2 10*3/uL (ref 0.0–0.5)
Eosinophils Relative: 4 %
HCT: 41.1 % (ref 36.0–46.0)
Hemoglobin: 13.5 g/dL (ref 12.0–15.0)
Immature Granulocytes: 0 %
Lymphocytes Relative: 31 %
Lymphs Abs: 1.7 10*3/uL (ref 0.7–4.0)
MCH: 36.1 pg — ABNORMAL HIGH (ref 26.0–34.0)
MCHC: 32.8 g/dL (ref 30.0–36.0)
MCV: 109.9 fL — ABNORMAL HIGH (ref 80.0–100.0)
Monocytes Absolute: 0.7 10*3/uL (ref 0.1–1.0)
Monocytes Relative: 13 %
Neutro Abs: 2.8 10*3/uL (ref 1.7–7.7)
Neutrophils Relative %: 51 %
Platelet Count: 217 10*3/uL (ref 150–400)
RBC: 3.74 MIL/uL — ABNORMAL LOW (ref 3.87–5.11)
RDW: 13.7 % (ref 11.5–15.5)
WBC Count: 5.4 10*3/uL (ref 4.0–10.5)
nRBC: 0 % (ref 0.0–0.2)

## 2021-05-09 LAB — CEA (ACCESS): CEA (CHCC): 1.84 ng/mL (ref 0.00–5.00)

## 2021-05-09 LAB — TOTAL PROTEIN, URINE DIPSTICK: Protein, ur: NEGATIVE mg/dL

## 2021-05-09 MED ORDER — SODIUM CHLORIDE 0.9 % IV SOLN
450.0000 mg | Freq: Once | INTRAVENOUS | Status: AC
  Administered 2021-05-09: 450 mg via INTRAVENOUS
  Filled 2021-05-09: qty 16

## 2021-05-09 MED ORDER — HEPARIN SOD (PORK) LOCK FLUSH 100 UNIT/ML IV SOLN
500.0000 [IU] | Freq: Once | INTRAVENOUS | Status: DC | PRN
  Filled 2021-05-09: qty 5

## 2021-05-09 MED ORDER — SODIUM CHLORIDE 0.9 % IV SOLN
1600.0000 mg/m2 | INTRAVENOUS | Status: DC
  Administered 2021-05-09: 2650 mg via INTRAVENOUS
  Filled 2021-05-09: qty 53

## 2021-05-09 MED ORDER — PALONOSETRON HCL INJECTION 0.25 MG/5ML
0.2500 mg | Freq: Once | INTRAVENOUS | Status: AC
  Administered 2021-05-09: 0.25 mg via INTRAVENOUS

## 2021-05-09 MED ORDER — SODIUM CHLORIDE 0.9% FLUSH
10.0000 mL | INTRAVENOUS | Status: DC | PRN
  Filled 2021-05-09: qty 10

## 2021-05-09 MED ORDER — SODIUM CHLORIDE 0.9 % IV SOLN
10.0000 mg | Freq: Once | INTRAVENOUS | Status: AC
  Administered 2021-05-09: 10 mg via INTRAVENOUS
  Filled 2021-05-09: qty 1

## 2021-05-09 MED ORDER — ATROPINE SULFATE 1 MG/ML IJ SOLN
0.5000 mg | Freq: Once | INTRAMUSCULAR | Status: AC | PRN
  Administered 2021-05-09: 0.5 mg via INTRAVENOUS

## 2021-05-09 MED ORDER — ATROPINE SULFATE 1 MG/ML IJ SOLN
INTRAMUSCULAR | Status: AC
  Filled 2021-05-09: qty 1

## 2021-05-09 MED ORDER — DEXTROSE 5 % IV SOLN
Freq: Once | INTRAVENOUS | Status: DC
  Filled 2021-05-09: qty 250

## 2021-05-09 MED ORDER — SODIUM CHLORIDE 0.9 % IV SOLN
100.0000 mg/m2 | Freq: Once | INTRAVENOUS | Status: AC
  Administered 2021-05-09: 160 mg via INTRAVENOUS
  Filled 2021-05-09: qty 8

## 2021-05-09 MED ORDER — SODIUM CHLORIDE 0.9 % IV SOLN
Freq: Once | INTRAVENOUS | Status: AC
  Filled 2021-05-09: qty 250

## 2021-05-09 MED ORDER — SODIUM CHLORIDE 0.9 % IV SOLN
150.0000 mg | Freq: Once | INTRAVENOUS | Status: AC
  Administered 2021-05-09: 150 mg via INTRAVENOUS
  Filled 2021-05-09: qty 5

## 2021-05-09 MED ORDER — LEUCOVORIN CALCIUM INJECTION 350 MG
400.0000 mg/m2 | Freq: Once | INTRAMUSCULAR | Status: AC
  Administered 2021-05-09: 660 mg via INTRAVENOUS
  Filled 2021-05-09: qty 33

## 2021-05-09 NOTE — Patient Instructions (Signed)
[facility]  Discharge Instructions: Thank you for choosing [facility] to provide your oncology and hematology care.   If you have a lab appointment with the [facility], please go directly to the [facility] and check in at the registration area.   Wear comfortable clothing and clothing appropriate for easy access to any Portacath or PICC line.   We strive to give you quality time with your provider. You may need to reschedule your appointment if you arrive late (15 or more minutes).  Arriving late affects you and other patients whose appointments are after yours.  Also, if you miss three or more appointments without notifying the office, you may be dismissed from the clinic at the provider's discretion.      For prescription refill requests, have your pharmacy contact our office and allow 72 hours for refills to be completed.    Today you received the following chemotherapy and/or immunotherapy agents Zirabev, Campostar, Leucovorin, 5Fu      To help prevent nausea and vomiting after your treatment, we encourage you to take your nausea medication as directed.  BELOW ARE SYMPTOMS THAT SHOULD BE REPORTED IMMEDIATELY: *FEVER GREATER THAN 100.4 F (38 C) OR HIGHER *CHILLS OR SWEATING *NAUSEA AND VOMITING THAT IS NOT CONTROLLED WITH YOUR NAUSEA MEDICATION *UNUSUAL SHORTNESS OF BREATH *UNUSUAL BRUISING OR BLEEDING *URINARY PROBLEMS (pain or burning when urinating, or frequent urination) *BOWEL PROBLEMS (unusual diarrhea, constipation, pain near the anus) TENDERNESS IN MOUTH AND THROAT WITH OR WITHOUT PRESENCE OF ULCERS (sore throat, sores in mouth, or a toothache) UNUSUAL RASH, SWELLING OR PAIN  UNUSUAL VAGINAL DISCHARGE OR ITCHING   Items with * indicate a potential emergency and should be followed up as soon as possible or go to the Emergency Department if any problems should occur.  Please show the CHEMOTHERAPY ALERT CARD or IMMUNOTHERAPY  ALERT CARD at check-in to the Emergency Department and triage nurse.  Should you have questions after your visit or need to cancel or reschedule your appointment, please contact [facility]  Dept: [phone]  and follow the prompts.  Office hours are 8:00 a.m. to 4:30 p.m. Monday - Friday. Please note that voicemails left after 4:00 p.m. may not be returned until the following business day.  We are closed weekends and major holidays. You have access to a nurse at all times for urgent questions. Please call the main number to the clinic Dept: [phone] and follow the prompts.   For any non-urgent questions, you may also contact your provider using MyChart. We now offer e-Visits for anyone 72 and older to request care online for non-urgent symptoms. For details visit [email or web address].   Also download the MyChart app! Go to the app store, search "MyChart", open the app, select [facility], and log in with your MyChart username and password.  Due to Covid, a mask is required upon entering the hospital/clinic. If you do not have a mask, one will be given to you upon arrival. For doctor visits, patients may have 1 support person aged 46 or older with them. For treatment visits, patients cannot have anyone with them due to current Covid guidelines and our immunocompromised population.   Bevacizumab injection What is this medication? BEVACIZUMAB (be va SIZ yoo mab) is a monoclonal antibody. It is used to treatmany types of cancer. This medicine may be used for other purposes; ask your health care provider orpharmacist if you have questions. COMMON BRAND NAME(S): Avastin,  MVASI, [name] What should I tell my care team before I take this medication? They need to know if you have any of these conditions: diabetes heart disease high blood pressure history of coughing up blood prior anthracycline chemotherapy (e.g., doxorubicin, daunorubicin, epirubicin) recent or  ongoing radiation therapy recent or planning to have surgery stroke an unusual or allergic reaction to bevacizumab, hamster proteins, mouse proteins, other medicines, foods, dyes, or preservatives pregnant or trying to get pregnant breast-feeding How should I use this medication? This medicine is for infusion into a vein. It is given by a health careprofessional in a hospital or clinic setting. Talk to your pediatrician regarding the use of this medicine in children.Special care may be needed. Overdosage: If you think you have taken too much of this medicine contact apoison control center or emergency room at once. NOTE: This medicine is only for you. Do not share this medicine with others. What if I miss a dose? It is important not to miss your dose. Call your doctor or health careprofessional if you are unable to keep an appointment. What may interact with this medication? Interactions are not expected. This list may not describe all possible interactions. Give your health care provider a list of all the medicines, herbs, non-prescription drugs, or dietary supplements you use. Also tell them if you smoke, drink alcohol, or use illegaldrugs. Some items may interact with your medicine. What should I watch for while using this medication? Your condition will be monitored carefully while you are receiving this medicine. You will need important blood work and urine testing done while youare taking this medicine. This medicine may increase your risk to bruise or bleed. Call your doctor orhealth care professional if you notice any unusual bleeding. Before having surgery, talk to your health care provider to make sure it is ok. This drug can increase the risk of poor healing of your surgical site or wound. You will need to stop this drug for 28 days before surgery. After surgery, wait at least 28 days before restarting this drug. Make sure the surgical site or wound is healed enough before restarting  this drug. Talk to your health careprovider if questions. Do not become pregnant while taking this medicine or for 6 months after stopping it. Women should inform their doctor if they wish to become pregnant or think they might be pregnant. There is a potential for serious side effects to an unborn child. Talk to your health care professional or pharmacist for more information. Do not breast-feed an infant while taking this medicine andfor 6 months after the last dose. This medicine has caused ovarian failure in some women. This medicine may interfere with the ability to have a child. You should talk to your doctor orhealth care professional if you are concerned about your fertility. What side effects may I notice from receiving this medication? Side effects that you should report to your doctor or health care professionalas soon as possible: allergic reactions like skin rash, itching or hives, swelling of the face, lips, or tongue chest pain or chest tightness chills coughing up blood high fever seizures severe constipation signs and symptoms of bleeding such as bloody or black, tarry stools; red or dark-brown urine; spitting up blood or brown material that looks like coffee grounds; red spots on the skin; unusual bruising or bleeding from the eye, gums, or nose signs and symptoms of a blood clot such as breathing problems; chest pain; severe, sudden headache; pain, swelling, warmth in the leg  signs and symptoms of a stroke like changes in vision; confusion; trouble speaking or understanding; severe headaches; sudden numbness or weakness of the face, arm or leg; trouble walking; dizziness; loss of balance or coordination stomach pain sweating swelling of legs or ankles vomiting weight gain Side effects that usually do not require medical attention (report to yourdoctor or health care professional if they continue or are bothersome): back pain changes in taste decreased appetite dry  skin nausea tiredness This list may not describe all possible side effects. Call your doctor for medical advice about side effects. You may report side effects to FDA at1-800-FDA-1088. Where should I keep my medication? This drug is given in a hospital or clinic and will not be stored at home. NOTE: This sheet is a summary. It may not cover all possible information. If you have questions about this medicine, talk to your doctor, pharmacist, orhealth care provider.  2022 Elsevier/Gold Standard (2019-09-02 10:50:46)  Irinotecan injection What is this medication? IRINOTECAN (ir in oh TEE kan ) is a chemotherapy drug. It is used to treatcolon and rectal cancer. This medicine may be used for other purposes; ask your health care provider orpharmacist if you have questions. COMMON BRAND NAME(S): Camptosar What should I tell my care team before I take this medication? They need to know if you have any of these conditions: dehydration diarrhea infection (especially a virus infection such as chickenpox, cold sores, or herpes) liver disease low blood counts, like low white cell, platelet, or red cell counts low levels of calcium, magnesium, or potassium in the blood recent or ongoing radiation therapy an unusual or allergic reaction to irinotecan, other medicines, foods, dyes, or preservatives pregnant or trying to get pregnant breast-feeding How should I use this medication? This drug is given as an infusion into a vein. It is administered in a hospitalor clinic by a specially trained health care professional. Talk to your pediatrician regarding the use of this medicine in children.Special care may be needed. Overdosage: If you think you have taken too much of this medicine contact apoison control center or emergency room at once. NOTE: This medicine is only for you. Do not share this medicine with others. What if I miss a dose? It is important not to miss your dose. Call your doctor or health  careprofessional if you are unable to keep an appointment. What may interact with this medication? Do not take this medicine with any of the following medications: cobicistat itraconazole This medicine may interact with the following medications: antiviral medicines for HIV or AIDS certain antibiotics like rifampin or rifabutin certain medicines for fungal infections like ketoconazole, posaconazole, and voriconazole certain medicines for seizures like carbamazepine, phenobarbital, phenotoin clarithromycin gemfibrozil nefazodone [facility]. John's Wort This list may not describe all possible interactions. Give your health care provider a list of all the medicines, herbs, non-prescription drugs, or dietary supplements you use. Also tell them if you smoke, drink alcohol, or use illegaldrugs. Some items may interact with your medicine. What should I watch for while using this medication? Your condition will be monitored carefully while you are receiving this medicine. You will need important blood work done while you are taking thismedicine. This drug may make you feel generally unwell. This is not uncommon, as chemotherapy can affect healthy cells as well as cancer cells. Report any side effects. Continue your course of treatment even though you feel ill unless yourdoctor tells you to stop. In some cases, you may be given additional medicines to  help with side effects.Follow all directions for their use. You may get drowsy or dizzy. Do not drive, use machinery, or do anything that needs mental alertness until you know how this medicine affects you. Do not stand or sit up quickly, especially if you are an older patient. This reducesthe risk of dizzy or fainting spells. Call your health care professional for advice if you get a fever, chills, or sore throat, or other symptoms of a cold or flu. Do not treat yourself. This medicine decreases your body's ability to fight infections. Try to avoid beingaround  people who are sick. Avoid taking products that contain aspirin, acetaminophen, ibuprofen, naproxen, or ketoprofen unless instructed by your doctor. These medicines may hide afever. This medicine may increase your risk to bruise or bleed. Call your doctor orhealth care professional if you notice any unusual bleeding. Be careful brushing and flossing your teeth or using a toothpick because you may get an infection or bleed more easily. If you have any dental work done,tell your dentist you are receiving this medicine. Do not become pregnant while taking this medicine or for 6 months after stopping it. Women should inform their health care professional if they wish to become pregnant or think they might be pregnant. Men should not father a child while taking this medicine and for 3 months after stopping it. There is potential for serious side effects to an unborn child. Talk to your health careprofessional for more information. Do not breast-feed an infant while taking this medicine or for 7 days afterstopping it. This medicine has caused ovarian failure in some women. This medicine may make it more difficult to get pregnant. Talk to your health care professional if Kathy Robinson concerned about your fertility. This medicine has caused decreased sperm counts in some men. This may make it more difficult to father a child. Talk to your health care professional if Kathy Robinson concerned about your fertility. What side effects may I notice from receiving this medication? Side effects that you should report to your doctor or health care professionalas soon as possible: allergic reactions like skin rash, itching or hives, swelling of the face, lips, or tongue chest pain diarrhea flushing, runny nose, sweating during infusion low blood counts - this medicine may decrease the number of white blood cells, red blood cells and platelets. You may be at increased risk for infections and bleeding. nausea, vomiting pain,  swelling, warmth in the leg signs of decreased platelets or bleeding - bruising, pinpoint red spots on the skin, black, tarry stools, blood in the urine signs of infection - fever or chills, cough, sore throat, pain or difficulty passing urine signs of decreased red blood cells - unusually weak or tired, fainting spells, lightheadedness Side effects that usually do not require medical attention (report to yourdoctor or health care professional if they continue or are bothersome): constipation hair loss headache loss of appetite mouth sores stomach pain This list may not describe all possible side effects. Call your doctor for medical advice about side effects. You may report side effects to FDA at1-800-FDA-1088. Where should I keep my medication? This drug is given in a hospital or clinic and will not be stored at home. NOTE: This sheet is a summary. It may not cover all possible information. If you have questions about this medicine, talk to your doctor, pharmacist, orhealth care provider.  2022 Elsevier/Gold Standard (2019-10-06 17:46:13)  Leucovorin injection What is this medication? LEUCOVORIN (loo koe VOR in) is used to prevent or treat  the harmful effects of some medicines. This medicine is used to treat anemia caused by a low amount of folic acid in the body. It is also used with 5-fluorouracil (5-FU) to treatcolon cancer. This medicine may be used for other purposes; ask your health care provider orpharmacist if you have questions. What should I tell my care team before I take this medication? They need to know if you have any of these conditions: anemia from low levels of vitamin B-12 in the blood an unusual or allergic reaction to leucovorin, folic acid, other medicines, foods, dyes, or preservatives pregnant or trying to get pregnant breast-feeding How should I use this medication? This medicine is for injection into a muscle or into a vein. It is given by ahealth care  professional in a hospital or clinic setting. Talk to your pediatrician regarding the use of this medicine in children.Special care may be needed. Overdosage: If you think you have taken too much of this medicine contact apoison control center or emergency room at once. NOTE: This medicine is only for you. Do not share this medicine with others. What if I miss a dose? This does not apply. What may interact with this medication? capecitabine fluorouracil phenobarbital phenytoin primidone trimethoprim-sulfamethoxazole This list may not describe all possible interactions. Give your health care provider a list of all the medicines, herbs, non-prescription drugs, or dietary supplements you use. Also tell them if you smoke, drink alcohol, or use illegaldrugs. Some items may interact with your medicine. What should I watch for while using this medication? Your condition will be monitored carefully while you are receiving thismedicine. This medicine may increase the side effects of 5-fluorouracil, 5-FU. Tell your doctor or health care professional if you have diarrhea or mouth sores that donot get better or that get worse. What side effects may I notice from receiving this medication? Side effects that you should report to your doctor or health care professionalas soon as possible: allergic reactions like skin rash, itching or hives, swelling of the face, lips, or tongue breathing problems fever, infection mouth sores unusual bleeding or bruising unusually weak or tired Side effects that usually do not require medical attention (report to yourdoctor or health care professional if they continue or are bothersome): constipation or diarrhea loss of appetite nausea, vomiting This list may not describe all possible side effects. Call your doctor for medical advice about side effects. You may report side effects to FDA at1-800-FDA-1088. Where should I keep my medication? This drug is given in a  hospital or clinic and will not be stored at home. NOTE: This sheet is a summary. It may not cover all possible information. If you have questions about this medicine, talk to your doctor, pharmacist, orhealth care provider.  2022 Elsevier/Gold Standard (2008-05-11 16:50:29)  Fluorouracil, 5-FU injection What is this medication? FLUOROURACIL, 5-FU (flure oh YOOR a sil) is a chemotherapy drug. It slows the growth of cancer cells. This medicine is used to treat many types of cancer like breast cancer, colon or rectal cancer, pancreatic cancer, and stomachcancer. This medicine may be used for other purposes; ask your health care provider orpharmacist if you have questions. COMMON BRAND NAME(S): Adrucil What should I tell my care team before I take this medication? They need to know if you have any of these conditions: blood disorders dihydropyrimidine dehydrogenase (DPD) deficiency infection (especially a virus infection such as chickenpox, cold sores, or herpes) kidney disease liver disease malnourished, poor nutrition recent or ongoing radiation therapy an  unusual or allergic reaction to fluorouracil, other chemotherapy, other medicines, foods, dyes, or preservatives pregnant or trying to get pregnant breast-feeding How should I use this medication? This drug is given as an infusion or injection into a vein. It is administeredin a hospital or clinic by a specially trained health care professional. Talk to your pediatrician regarding the use of this medicine in children.Special care may be needed. Overdosage: If you think you have taken too much of this medicine contact apoison control center or emergency room at once. NOTE: This medicine is only for you. Do not share this medicine with others. What if I miss a dose? It is important not to miss your dose. Call your doctor or health careprofessional if you are unable to keep an appointment. What may interact with this medication? Do not  take this medicine with any of the following medications: live virus vaccines This medicine may also interact with the following medications: medicines that treat or prevent blood clots like warfarin, enoxaparin, and dalteparin This list may not describe all possible interactions. Give your health care provider a list of all the medicines, herbs, non-prescription drugs, or dietary supplements you use. Also tell them if you smoke, drink alcohol, or use illegaldrugs. Some items may interact with your medicine. What should I watch for while using this medication? Visit your doctor for checks on your progress. This drug may make you feel generally unwell. This is not uncommon, as chemotherapy can affect healthy cells as well as cancer cells. Report any side effects. Continue your course oftreatment even though you feel ill unless your doctor tells you to stop. In some cases, you may be given additional medicines to help with side effects.Follow all directions for their use. Call your doctor or health care professional for advice if you get a fever, chills or sore throat, or other symptoms of a cold or flu. Do not treat yourself. This drug decreases your body's ability to fight infections. Try toavoid being around people who are sick. This medicine may increase your risk to bruise or bleed. Call your doctor orhealth care professional if you notice any unusual bleeding. Be careful brushing and flossing your teeth or using a toothpick because you may get an infection or bleed more easily. If you have any dental work done,tell your dentist you are receiving this medicine. Avoid taking products that contain aspirin, acetaminophen, ibuprofen, naproxen, or ketoprofen unless instructed by your doctor. These medicines may hide afever. Do not become pregnant while taking this medicine. Women should inform their doctor if they wish to become pregnant or think they might be pregnant. There is a potential for serious  side effects to an unborn child. Talk to your health care professional or pharmacist for more information. Do not breast-feed aninfant while taking this medicine. Men should inform their doctor if they wish to father a child. This medicinemay lower sperm counts. Do not treat diarrhea with over the counter products. Contact your doctor ifyou have diarrhea that lasts more than 2 days or if it is severe and watery. This medicine can make you more sensitive to the sun. Keep out of the sun. If you cannot avoid being in the sun, wear protective clothing and use sunscreen.Do not use sun lamps or tanning beds/booths. What side effects may I notice from receiving this medication? Side effects that you should report to your doctor or health care professionalas soon as possible: allergic reactions like skin rash, itching or hives, swelling of the face, lips,  or tongue low blood counts - this medicine may decrease the number of white blood cells, red blood cells and platelets. You may be at increased risk for infections and bleeding. signs of infection - fever or chills, cough, sore throat, pain or difficulty passing urine signs of decreased platelets or bleeding - bruising, pinpoint red spots on the skin, black, tarry stools, blood in the urine signs of decreased red blood cells - unusually weak or tired, fainting spells, lightheadedness breathing problems changes in vision chest pain mouth sores nausea and vomiting pain, swelling, redness at site where injected pain, tingling, numbness in the hands or feet redness, swelling, or sores on hands or feet stomach pain unusual bleeding Side effects that usually do not require medical attention (report to yourdoctor or health care professional if they continue or are bothersome): changes in finger or toe nails diarrhea dry or itchy skin hair loss headache loss of appetite sensitivity of eyes to the light stomach upset unusually teary eyes This list may  not describe all possible side effects. Call your doctor for medical advice about side effects. You may report side effects to FDA at1-800-FDA-1088. Where should I keep my medication? This drug is given in a hospital or clinic and will not be stored at home. NOTE: This sheet is a summary. It may not cover all possible information. If you have questions about this medicine, talk to your doctor, pharmacist, orhealth care provider.  2022 Elsevier/Gold Standard (2019-10-06 15:00:03)

## 2021-05-09 NOTE — Progress Notes (Signed)
[facility] OFFICE PROGRESS NOTE   Diagnosis: Colon cancer  INTERVAL HISTORY:   Kathy Robinson completed another cycle of FOLFIRI/Avastin on 04/19/2021.  She did not have significant nausea or diarrhea following this cycle of chemotherapy.  She feels well.  She continues to have neuropathy symptoms in the extremities.  She developed a "bug bite "at the dorsum of the distal left foot.  She had increased erythema when she applied "A&E "ointment.  Objective:  Vital signs in last 24 hours:  Blood pressure 140/71, pulse 95, temperature 97.8 F (36.6 C), temperature source Oral, resp. rate 20, height _0  (1.676 m), weight 144 lb 3.2 oz (65.4 kg), last menstrual period 11/19/1996, SpO2 100 %.    HEENT: No thrush or ulcers Resp: Lungs clear bilaterally Cardio: Regular rate and rhythm GI: No hepatosplenomegaly Vascular: No leg edema  Skin: 1-2 cm blister type lesion at the dorsum of the base of the left great toe.  There is a surrounding erythematous rash.  The rash appears to be resolving.  Portacath/PICC-without erythema  Lab Results:  Lab Results  Component Value Date   WBC 5.4 05/09/2021   HGB 13.5 05/09/2021   HCT 41.1 05/09/2021   MCV 109.9 (H) 05/09/2021   PLT 217 05/09/2021   NEUTROABS 2.8 05/09/2021    CMP  Lab Results  Component Value Date   NA 138 04/18/2021   K 4.6 04/18/2021   CL 105 04/18/2021   CO2 26 04/18/2021   GLUCOSE 85 04/18/2021   BUN 22 04/18/2021   CREATININE 0.81 04/18/2021   CALCIUM 8.6 (L) 04/18/2021   PROT 6.3 (L) 04/18/2021   ALBUMIN 3.8 04/18/2021   AST 23 04/18/2021   ALT 31 04/18/2021   ALKPHOS 111 04/18/2021   BILITOT 0.3 04/18/2021   GFRNONAA >60 04/18/2021   GFRAA >60 08/16/2020    Lab Results  Component Value Date   CEA1 1.16 03/29/2021    Medications: I have reviewed the patient's current medications.   Assessment/Plan: Adenocarcinoma the left colon, stage IIIc (pT4b,pN2a), status post a left colectomy  05/30/2020, MSS, TMB 13, BRAF V600E Tumor invades the visceral peritoneum, lymphovascular and perineural invasion present, for tumor deposits, 7/13 lymph nodes, negative resection margins, no loss of mismatch repair protein expression CT abdomen/pelvis 05/26/2020-long segment of masslike thickening of the descending colon with associated high-grade colonic obstruction, small colonic wall defect with evidence of a focally contained microperforation, retroperitoneal adenopathy, indeterminate small hypodense liver lesions Elevated preoperative CEA PET 06/22/2020-hypermetabolic liver metastases, periportal and periaortic adenopathy, hypermetabolic mediastinal and left supraclavicular nodes Cycle 1 FOLFOX 07/05/20  Cycle 2 FOLFOXIRI (dose-reduced 5FU, no bolus) and Avastin 07/19/20  Cycle 3 FOLFOXIRI (Dose reduced 5-FU, no bolus)/Avastin 08/02/2020 Cycle 4 FOLFOXIRI/Avastin 08/16/2020 (Udenyca added) Cycle 5 FOLFOXIRI/Avastin 08/30/2020 CTs 09/09/2020-diminished size of lymph nodes in the chest, abdomen, and pelvis.  Decreased hepatic metastases.  No new evidence of disease progression, fat density lesion surrounding the left hemicolectomy Cycle 6 FOLFOXIRI/Avastin 09/13/2020 Cycle 7 FOLFOX/Avastin 09/27/2020 (irinotecan held) Cycle 8 FOLFOXIRI/Avastin 10/18/2020  Cycle 9 FOLFOXIRI/Avastin 11/01/2020 Cycle 10 FOLFOXIRI/Avastin 11/22/2020 (oxaliplatin held, irinotecan and 5-FU dose reduced) Cycle 11 FOLFOXIRI/Avastin 12/12/2020 (oxaliplatin held) CTs 12/19/2020-decrease in size of liver metastases, no evidence of disease progression Cycle 12 FOLFOXIRI/Avastin 01/04/2021 (oxaliplatin held) Cycle 13 FOLFOXIRI/Avastin 01/24/2021 (oxaliplatin held) Cycle 14 FOLFOXIRI/Avastin 02/14/2021 (oxaliplatin held) Cycle 15 FOLFOXIRI/Avastin 03/07/2021 (oxaliplatin held) Cycle 16 FOLFOXIRI/Avastin 03/29/2021 (oxaliplatin held) CTs 04/14/2021- decreased size of liver metastases.  No new or progressive findings. Cycle 17  FOLFOXIRI/Avastin  04/19/2021 (oxaliplatin held) Cycle 18 FOLFOXIRI/Avastin 05/09/2021 (oxaliplatin held) Atrial fibrillation with rapid ventricular response 05/26/2020 Mild neutropenia following cycle 3 FOLFOXIRI, Udenyca added with cycle 4 Hypokalemia secondary to diarrhea-potassium supplementation starting 09/13/2020 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  She will complete another cycle of FOLFOXIRI/Avastin today.  The rash at the dorsum of the right foot and insect bite appear to be healing she will return for an office visit and chemotherapy in 3 weeks.  [name], MD  05/09/2021  9:23 AM

## 2021-05-10 ENCOUNTER — Ambulatory Visit: Payer: Medicare Other

## 2021-05-10 ENCOUNTER — Other Ambulatory Visit: Payer: Medicare Other

## 2021-05-10 ENCOUNTER — Ambulatory Visit: Payer: Medicare Other | Admitting: Oncology

## 2021-05-11 ENCOUNTER — Other Ambulatory Visit: Payer: Self-pay

## 2021-05-11 ENCOUNTER — Inpatient Hospital Stay: Payer: Medicare Other

## 2021-05-11 VITALS — BP 134/66 | HR 79 | Temp 98.8°F | Resp 20

## 2021-05-11 DIAGNOSIS — I4891 Unspecified atrial fibrillation: Secondary | ICD-10-CM | POA: Diagnosis not present

## 2021-05-11 DIAGNOSIS — C185 Malignant neoplasm of splenic flexure: Secondary | ICD-10-CM

## 2021-05-11 DIAGNOSIS — Z5189 Encounter for other specified aftercare: Secondary | ICD-10-CM | POA: Diagnosis not present

## 2021-05-11 DIAGNOSIS — E876 Hypokalemia: Secondary | ICD-10-CM | POA: Diagnosis not present

## 2021-05-11 DIAGNOSIS — D709 Neutropenia, unspecified: Secondary | ICD-10-CM | POA: Diagnosis not present

## 2021-05-11 DIAGNOSIS — T451X5A Adverse effect of antineoplastic and immunosuppressive drugs, initial encounter: Secondary | ICD-10-CM | POA: Diagnosis not present

## 2021-05-11 DIAGNOSIS — G620 Drug-induced polyneuropathy: Secondary | ICD-10-CM | POA: Diagnosis not present

## 2021-05-11 DIAGNOSIS — L539 Erythematous condition, unspecified: Secondary | ICD-10-CM | POA: Diagnosis not present

## 2021-05-11 DIAGNOSIS — Z5111 Encounter for antineoplastic chemotherapy: Secondary | ICD-10-CM | POA: Diagnosis not present

## 2021-05-11 MED ORDER — PEGFILGRASTIM-CBQV 6 MG/0.6ML ~~LOC~~ SOSY
6.0000 mg | PREFILLED_SYRINGE | Freq: Once | SUBCUTANEOUS | Status: AC
  Administered 2021-05-11: 6 mg via SUBCUTANEOUS

## 2021-05-11 MED ORDER — SODIUM CHLORIDE 0.9% FLUSH
10.0000 mL | INTRAVENOUS | Status: DC | PRN
  Administered 2021-05-11: 10 mL
  Filled 2021-05-11: qty 10

## 2021-05-11 MED ORDER — HEPARIN SOD (PORK) LOCK FLUSH 100 UNIT/ML IV SOLN
500.0000 [IU] | Freq: Once | INTRAVENOUS | Status: AC | PRN
  Administered 2021-05-11: 500 [IU]
  Filled 2021-05-11: qty 5

## 2021-05-11 NOTE — Patient Instructions (Signed)

## 2021-05-27 ENCOUNTER — Other Ambulatory Visit (HOSPITAL_COMMUNITY): Payer: Self-pay

## 2021-05-28 ENCOUNTER — Other Ambulatory Visit: Payer: Self-pay | Admitting: Oncology

## 2021-05-30 ENCOUNTER — Inpatient Hospital Stay: Payer: Medicare Other | Attending: Oncology

## 2021-05-30 ENCOUNTER — Inpatient Hospital Stay: Payer: Medicare Other

## 2021-05-30 ENCOUNTER — Inpatient Hospital Stay: Payer: Medicare Other | Admitting: Oncology

## 2021-05-30 ENCOUNTER — Other Ambulatory Visit: Payer: Self-pay

## 2021-05-30 VITALS — BP 140/73 | HR 92 | Temp 98.1°F | Resp 18 | Ht 66.0 in | Wt 145.4 lb

## 2021-05-30 DIAGNOSIS — G629 Polyneuropathy, unspecified: Secondary | ICD-10-CM | POA: Insufficient documentation

## 2021-05-30 DIAGNOSIS — C185 Malignant neoplasm of splenic flexure: Secondary | ICD-10-CM | POA: Diagnosis not present

## 2021-05-30 DIAGNOSIS — E876 Hypokalemia: Secondary | ICD-10-CM | POA: Diagnosis not present

## 2021-05-30 DIAGNOSIS — Z5111 Encounter for antineoplastic chemotherapy: Secondary | ICD-10-CM | POA: Diagnosis not present

## 2021-05-30 DIAGNOSIS — Z79899 Other long term (current) drug therapy: Secondary | ICD-10-CM | POA: Diagnosis not present

## 2021-05-30 DIAGNOSIS — C787 Secondary malignant neoplasm of liver and intrahepatic bile duct: Secondary | ICD-10-CM | POA: Insufficient documentation

## 2021-05-30 DIAGNOSIS — I4891 Unspecified atrial fibrillation: Secondary | ICD-10-CM | POA: Insufficient documentation

## 2021-05-30 DIAGNOSIS — Z95828 Presence of other vascular implants and grafts: Secondary | ICD-10-CM

## 2021-05-30 DIAGNOSIS — R110 Nausea: Secondary | ICD-10-CM | POA: Insufficient documentation

## 2021-05-30 DIAGNOSIS — D709 Neutropenia, unspecified: Secondary | ICD-10-CM | POA: Diagnosis not present

## 2021-05-30 DIAGNOSIS — R197 Diarrhea, unspecified: Secondary | ICD-10-CM | POA: Insufficient documentation

## 2021-05-30 LAB — LIPID PANEL
Cholesterol: 255 mg/dL — ABNORMAL HIGH (ref 0–200)
HDL: 69 mg/dL (ref >40)
LDL Cholesterol: 157 mg/dL — ABNORMAL HIGH (ref 0–99)
Total CHOL/HDL Ratio: 3.7 RATIO
Triglycerides: 146 mg/dL (ref <150)
VLDL: 29 mg/dL (ref 0–40)

## 2021-05-30 LAB — CMP (CANCER CENTER ONLY)
ALT: 34 U/L (ref 0–44)
AST: 27 U/L (ref 15–41)
Albumin: 3.9 g/dL (ref 3.5–5.0)
Alkaline Phosphatase: 110 U/L (ref 38–126)
Anion gap: 8 (ref 5–15)
BUN: 22 mg/dL (ref 8–23)
CO2: 26 mmol/L (ref 22–32)
Calcium: 9.3 mg/dL (ref 8.9–10.3)
Chloride: 107 mmol/L (ref 98–111)
Creatinine: 0.76 mg/dL (ref 0.44–1.00)
GFR, Estimated: >60 mL/min (ref >60)
Glucose, Bld: 86 mg/dL (ref 70–99)
Potassium: 4.1 mmol/L (ref 3.5–5.1)
Sodium: 141 mmol/L (ref 135–145)
Total Bilirubin: 0.4 mg/dL (ref 0.3–1.2)
Total Protein: 6.5 g/dL (ref 6.5–8.1)

## 2021-05-30 LAB — CBC WITH DIFFERENTIAL (CANCER CENTER ONLY)
Abs Immature Granulocytes: 0.01 10*3/uL (ref 0.00–0.07)
Basophils Absolute: 0.1 10*3/uL (ref 0.0–0.1)
Basophils Relative: 1 %
Eosinophils Absolute: 0.2 10*3/uL (ref 0.0–0.5)
Eosinophils Relative: 4 %
HCT: 40.8 % (ref 36.0–46.0)
Hemoglobin: 13.5 g/dL (ref 12.0–15.0)
Immature Granulocytes: 0 %
Lymphocytes Relative: 25 %
Lymphs Abs: 1.5 10*3/uL (ref 0.7–4.0)
MCH: 36.4 pg — ABNORMAL HIGH (ref 26.0–34.0)
MCHC: 33.1 g/dL (ref 30.0–36.0)
MCV: 110 fL — ABNORMAL HIGH (ref 80.0–100.0)
Monocytes Absolute: 0.7 10*3/uL (ref 0.1–1.0)
Monocytes Relative: 12 %
Neutro Abs: 3.5 10*3/uL (ref 1.7–7.7)
Neutrophils Relative %: 58 %
Platelet Count: 217 10*3/uL (ref 150–400)
RBC: 3.71 MIL/uL — ABNORMAL LOW (ref 3.87–5.11)
RDW: 13.7 % (ref 11.5–15.5)
WBC Count: 5.9 10*3/uL (ref 4.0–10.5)
nRBC: 0 % (ref 0.0–0.2)

## 2021-05-30 LAB — TOTAL PROTEIN, URINE DIPSTICK: Protein, ur: NEGATIVE mg/dL

## 2021-05-30 LAB — CEA (ACCESS): CEA (CHCC): 1.73 ng/mL (ref 0.00–5.00)

## 2021-05-30 MED ORDER — SODIUM CHLORIDE 0.9 % IV SOLN
Freq: Once | INTRAVENOUS | Status: DC
  Filled 2021-05-30: qty 250

## 2021-05-30 MED ORDER — PALONOSETRON HCL INJECTION 0.25 MG/5ML
0.2500 mg | Freq: Once | INTRAVENOUS | Status: AC
  Administered 2021-05-30: 0.25 mg via INTRAVENOUS
  Filled 2021-05-30: qty 5

## 2021-05-30 MED ORDER — ATROPINE SULFATE 1 MG/ML IJ SOLN
0.5000 mg | Freq: Once | INTRAMUSCULAR | Status: AC | PRN
  Administered 2021-05-30: 0.5 mg via INTRAVENOUS
  Filled 2021-05-30: qty 1

## 2021-05-30 MED ORDER — SODIUM CHLORIDE 0.9% FLUSH
10.0000 mL | INTRAVENOUS | Status: DC | PRN
  Filled 2021-05-30: qty 10

## 2021-05-30 MED ORDER — SODIUM CHLORIDE 0.9 % IV SOLN
150.0000 mg | Freq: Once | INTRAVENOUS | Status: AC
  Administered 2021-05-30: 150 mg via INTRAVENOUS
  Filled 2021-05-30: qty 5

## 2021-05-30 MED ORDER — SODIUM CHLORIDE 0.9% FLUSH
10.0000 mL | Freq: Once | INTRAVENOUS | Status: AC
  Administered 2021-05-30: 10 mL
  Filled 2021-05-30: qty 10

## 2021-05-30 MED ORDER — SODIUM CHLORIDE 0.9 % IV SOLN
10.0000 mg | Freq: Once | INTRAVENOUS | Status: AC
  Administered 2021-05-30: 10 mg via INTRAVENOUS
  Filled 2021-05-30: qty 1

## 2021-05-30 MED ORDER — SODIUM CHLORIDE 0.9 % IV SOLN
Freq: Once | INTRAVENOUS | Status: AC
  Filled 2021-05-30: qty 250

## 2021-05-30 MED ORDER — SODIUM CHLORIDE 0.9 % IV SOLN
400.0000 mg/m2 | Freq: Once | INTRAVENOUS | Status: AC
  Administered 2021-05-30: 660 mg via INTRAVENOUS
  Filled 2021-05-30: qty 33

## 2021-05-30 MED ORDER — SODIUM CHLORIDE 0.9 % IV SOLN
450.0000 mg | Freq: Once | INTRAVENOUS | Status: AC
  Administered 2021-05-30: 450 mg via INTRAVENOUS
  Filled 2021-05-30: qty 16

## 2021-05-30 MED ORDER — SODIUM CHLORIDE 0.9 % IV SOLN
1600.0000 mg/m2 | INTRAVENOUS | Status: DC
  Administered 2021-05-30: 2650 mg via INTRAVENOUS
  Filled 2021-05-30: qty 53

## 2021-05-30 MED ORDER — SODIUM CHLORIDE 0.9 % IV SOLN
100.0000 mg/m2 | Freq: Once | INTRAVENOUS | Status: AC
  Administered 2021-05-30: 160 mg via INTRAVENOUS
  Filled 2021-05-30: qty 8

## 2021-05-30 NOTE — Patient Instructions (Signed)
Implanted Port Home Guide An implanted port is a device that is placed under the skin. It is usually placed in the chest. The device can be used to give IV medicine, to take blood, or for dialysis. You may have an implanted port if: You need IV medicine that would be irritating to the small veins in your hands or arms. You need IV medicines, such as antibiotics, for a long period of time. You need IV nutrition for a long period of time. You need dialysis. When you have a port, your health care provider can choose to use the port instead of veins in your arms for these procedures. You may have fewer limitations when using a port than you would if you used other types of long-term IVs, and you will likely be able to return to normal activities afteryour incision heals. An implanted port has two main parts: Reservoir. The reservoir is the part where a needle is inserted to give medicines or draw blood. The reservoir is round. After it is placed, it appears as a small, raised area under your skin. Catheter. The catheter is a thin, flexible tube that connects the reservoir to a vein. Medicine that is inserted into the reservoir goes into the catheter and then into the vein. How is my port accessed? To access your port: A numbing cream may be placed on the skin over the port site. Your health care provider will put on a mask and sterile gloves. The skin over your port will be cleaned carefully with a germ-killing soap and allowed to dry. Your health care provider will gently pinch the port and insert a needle into it. Your health care provider will check for a blood return to make sure the port is in the vein and is not clogged. If your port needs to remain accessed to get medicine continuously (constant infusion), your health care provider will place a clear bandage (dressing) over the needle site. The dressing and needle will need to be changed every week, or as told by your health care provider. What  is flushing? Flushing helps keep the port from getting clogged. Follow instructions from your health care provider about how and when to flush the port. Ports are usually flushed with saline solution or a medicine called heparin. The need for flushing will depend on how the port is used: If the port is only used from time to time to give medicines or draw blood, the port may need to be flushed: Before and after medicines have been given. Before and after blood has been drawn. As part of routine maintenance. Flushing may be recommended every 4-6 weeks. If a constant infusion is running, the port may not need to be flushed. Throw away any syringes in a disposal container that is meant for sharp items (sharps container). You can buy a sharps container from a pharmacy, or you can make one by using an empty hard plastic bottle with a cover. How long will my port stay implanted? The port can stay in for as long as your health care provider thinks it is needed. When it is time for the port to come out, a surgery will be done to remove it. The surgery will be similar to the procedure that was done to putthe port in. Follow these instructions at home:  Flush your port as told by your health care provider. If you need an infusion over several days, follow instructions from your health care provider about how to take   care of your port site. Make sure you: Wash your hands with soap and water before you change your dressing. If soap and water are not available, use alcohol-based hand sanitizer. Change your dressing as told by your health care provider. Place any used dressings or infusion bags into a plastic bag. Throw that bag in the trash. Keep the dressing that covers the needle clean and dry. Do not get it wet. Do not use scissors or sharp objects near the tube. Keep the tube clamped, unless it is being used. Check your port site every day for signs of infection. Check for: Redness, swelling, or  pain. Fluid or blood. Pus or a bad smell. Protect the skin around the port site. Avoid wearing bra straps that rub or irritate the site. Protect the skin around your port from seat belts. Place a soft pad over your chest if needed. Bathe or shower as told by your health care provider. The site may get wet as long as you are not actively receiving an infusion. Return to your normal activities as told by your health care provider. Ask your health care provider what activities are safe for you. Carry a medical alert card or wear a medical alert bracelet at all times. This will let health care providers know that you have an implanted port in case of an emergency. Get help right away if: You have redness, swelling, or pain at the port site. You have fluid or blood coming from your port site. You have pus or a bad smell coming from the port site. You have a fever. Summary Implanted ports are usually placed in the chest for long-term IV access. Follow instructions from your health care provider about flushing the port and changing bandages (dressings). Take care of the area around your port by avoiding clothing that puts pressure on the area, and by watching for signs of infection. Protect the skin around your port from seat belts. Place a soft pad over your chest if needed. Get help right away if you have a fever or you have redness, swelling, pain, drainage, or a bad smell at the port site. This information is not intended to replace advice given to you by your health care provider. Make sure you discuss any questions you have with your healthcare provider. Document Revised: 03/21/2020 Document Reviewed: 03/21/2020 [employer] Patient Education  2022 [employer]  

## 2021-05-30 NOTE — Patient Instructions (Signed)
[facility]  Discharge Instructions: Thank you for choosing [facility] to provide your oncology and hematology care.   If you have a lab appointment with the [facility], please go directly to the [facility] and check in at the registration area.   Wear comfortable clothing and clothing appropriate for easy access to any Portacath or PICC line.   We strive to give you quality time with your provider. You may need to reschedule your appointment if you arrive late (15 or more minutes).  Arriving late affects you and other patients whose appointments are after yours.  Also, if you miss three or more appointments without notifying the office, you may be dismissed from the clinic at the provider's discretion.      For prescription refill requests, have your pharmacy contact our office and allow 72 hours for refills to be completed.    Today you received the following chemotherapy and/or immunotherapy agents Zirabev, Campostar, Leucovorin, 5Fu      To help prevent nausea and vomiting after your treatment, we encourage you to take your nausea medication as directed.  BELOW ARE SYMPTOMS THAT SHOULD BE REPORTED IMMEDIATELY: *FEVER GREATER THAN 100.4 F (38 C) OR HIGHER *CHILLS OR SWEATING *NAUSEA AND VOMITING THAT IS NOT CONTROLLED WITH YOUR NAUSEA MEDICATION *UNUSUAL SHORTNESS OF BREATH *UNUSUAL BRUISING OR BLEEDING *URINARY PROBLEMS (pain or burning when urinating, or frequent urination) *BOWEL PROBLEMS (unusual diarrhea, constipation, pain near the anus) TENDERNESS IN MOUTH AND THROAT WITH OR WITHOUT PRESENCE OF ULCERS (sore throat, sores in mouth, or a toothache) UNUSUAL RASH, SWELLING OR PAIN  UNUSUAL VAGINAL DISCHARGE OR ITCHING   Items with * indicate a potential emergency and should be followed up as soon as possible or go to the Emergency Department if any problems should occur.  Please show the CHEMOTHERAPY ALERT CARD or IMMUNOTHERAPY  ALERT CARD at check-in to the Emergency Department and triage nurse.  Should you have questions after your visit or need to cancel or reschedule your appointment, please contact [facility]  Dept: [phone]  and follow the prompts.  Office hours are 8:00 a.m. to 4:30 p.m. Monday - Friday. Please note that voicemails left after 4:00 p.m. may not be returned until the following business day.  We are closed weekends and major holidays. You have access to a nurse at all times for urgent questions. Please call the main number to the clinic Dept: [phone] and follow the prompts.   For any non-urgent questions, you may also contact your provider using MyChart. We now offer e-Visits for anyone 46 and older to request care online for non-urgent symptoms. For details visit [email or web address].   Also download the MyChart app! Go to the app store, search "MyChart", open the app, select [facility], and log in with your MyChart username and password.  Due to Covid, a mask is required upon entering the hospital/clinic. If you do not have a mask, one will be given to you upon arrival. For doctor visits, patients may have 1 support person aged 45 or older with them. For treatment visits, patients cannot have anyone with them due to current Covid guidelines and our immunocompromised population.   Bevacizumab injection What is this medication? BEVACIZUMAB (be va SIZ yoo mab) is a monoclonal antibody. It is used to treatmany types of cancer. This medicine may be used for other purposes; ask your health care provider orpharmacist if you have questions. COMMON BRAND NAME(S): Avastin,  MVASI, [name] What should I tell my care team before I take this medication? They need to know if you have any of these conditions: diabetes heart disease high blood pressure history of coughing up blood prior anthracycline chemotherapy (e.g., doxorubicin, daunorubicin, epirubicin) recent or  ongoing radiation therapy recent or planning to have surgery stroke an unusual or allergic reaction to bevacizumab, hamster proteins, mouse proteins, other medicines, foods, dyes, or preservatives pregnant or trying to get pregnant breast-feeding How should I use this medication? This medicine is for infusion into a vein. It is given by a health careprofessional in a hospital or clinic setting. Talk to your pediatrician regarding the use of this medicine in children.Special care may be needed. Overdosage: If you think you have taken too much of this medicine contact apoison control center or emergency room at once. NOTE: This medicine is only for you. Do not share this medicine with others. What if I miss a dose? It is important not to miss your dose. Call your doctor or health careprofessional if you are unable to keep an appointment. What may interact with this medication? Interactions are not expected. This list may not describe all possible interactions. Give your health care provider a list of all the medicines, herbs, non-prescription drugs, or dietary supplements you use. Also tell them if you smoke, drink alcohol, or use illegaldrugs. Some items may interact with your medicine. What should I watch for while using this medication? Your condition will be monitored carefully while you are receiving this medicine. You will need important blood work and urine testing done while youare taking this medicine. This medicine may increase your risk to bruise or bleed. Call your doctor orhealth care professional if you notice any unusual bleeding. Before having surgery, talk to your health care provider to make sure it is ok. This drug can increase the risk of poor healing of your surgical site or wound. You will need to stop this drug for 28 days before surgery. After surgery, wait at least 28 days before restarting this drug. Make sure the surgical site or wound is healed enough before restarting  this drug. Talk to your health careprovider if questions. Do not become pregnant while taking this medicine or for 6 months after stopping it. Women should inform their doctor if they wish to become pregnant or think they might be pregnant. There is a potential for serious side effects to an unborn child. Talk to your health care professional or pharmacist for more information. Do not breast-feed an infant while taking this medicine andfor 6 months after the last dose. This medicine has caused ovarian failure in some women. This medicine may interfere with the ability to have a child. You should talk to your doctor orhealth care professional if you are concerned about your fertility. What side effects may I notice from receiving this medication? Side effects that you should report to your doctor or health care professionalas soon as possible: allergic reactions like skin rash, itching or hives, swelling of the face, lips, or tongue chest pain or chest tightness chills coughing up blood high fever seizures severe constipation signs and symptoms of bleeding such as bloody or black, tarry stools; red or dark-brown urine; spitting up blood or brown material that looks like coffee grounds; red spots on the skin; unusual bruising or bleeding from the eye, gums, or nose signs and symptoms of a blood clot such as breathing problems; chest pain; severe, sudden headache; pain, swelling, warmth in the leg  signs and symptoms of a stroke like changes in vision; confusion; trouble speaking or understanding; severe headaches; sudden numbness or weakness of the face, arm or leg; trouble walking; dizziness; loss of balance or coordination stomach pain sweating swelling of legs or ankles vomiting weight gain Side effects that usually do not require medical attention (report to yourdoctor or health care professional if they continue or are bothersome): back pain changes in taste decreased appetite dry  skin nausea tiredness This list may not describe all possible side effects. Call your doctor for medical advice about side effects. You may report side effects to FDA at1-800-FDA-1088. Where should I keep my medication? This drug is given in a hospital or clinic and will not be stored at home. NOTE: This sheet is a summary. It may not cover all possible information. If you have questions about this medicine, talk to your doctor, pharmacist, orhealth care provider.  2022 Elsevier/Gold Standard (2019-09-02 10:50:46)  Irinotecan injection What is this medication? IRINOTECAN (ir in oh TEE kan ) is a chemotherapy drug. It is used to treatcolon and rectal cancer. This medicine may be used for other purposes; ask your health care provider orpharmacist if you have questions. COMMON BRAND NAME(S): Camptosar What should I tell my care team before I take this medication? They need to know if you have any of these conditions: dehydration diarrhea infection (especially a virus infection such as chickenpox, cold sores, or herpes) liver disease low blood counts, like low white cell, platelet, or red cell counts low levels of calcium, magnesium, or potassium in the blood recent or ongoing radiation therapy an unusual or allergic reaction to irinotecan, other medicines, foods, dyes, or preservatives pregnant or trying to get pregnant breast-feeding How should I use this medication? This drug is given as an infusion into a vein. It is administered in a hospitalor clinic by a specially trained health care professional. Talk to your pediatrician regarding the use of this medicine in children.Special care may be needed. Overdosage: If you think you have taken too much of this medicine contact apoison control center or emergency room at once. NOTE: This medicine is only for you. Do not share this medicine with others. What if I miss a dose? It is important not to miss your dose. Call your doctor or health  careprofessional if you are unable to keep an appointment. What may interact with this medication? Do not take this medicine with any of the following medications: cobicistat itraconazole This medicine may interact with the following medications: antiviral medicines for HIV or AIDS certain antibiotics like rifampin or rifabutin certain medicines for fungal infections like ketoconazole, posaconazole, and voriconazole certain medicines for seizures like carbamazepine, phenobarbital, phenotoin clarithromycin gemfibrozil nefazodone St. John's Wort This list may not describe all possible interactions. Give your health care provider a list of all the medicines, herbs, non-prescription drugs, or dietary supplements you use. Also tell them if you smoke, drink alcohol, or use illegaldrugs. Some items may interact with your medicine. What should I watch for while using this medication? Your condition will be monitored carefully while you are receiving this medicine. You will need important blood work done while you are taking thismedicine. This drug may make you feel generally unwell. This is not uncommon, as chemotherapy can affect healthy cells as well as cancer cells. Report any side effects. Continue your course of treatment even though you feel ill unless yourdoctor tells you to stop. In some cases, you may be given additional medicines to  help with side effects.Follow all directions for their use. You may get drowsy or dizzy. Do not drive, use machinery, or do anything that needs mental alertness until you know how this medicine affects you. Do not stand or sit up quickly, especially if you are an older patient. This reducesthe risk of dizzy or fainting spells. Call your health care professional for advice if you get a fever, chills, or sore throat, or other symptoms of a cold or flu. Do not treat yourself. This medicine decreases your body's ability to fight infections. Try to avoid beingaround  people who are sick. Avoid taking products that contain aspirin, acetaminophen, ibuprofen, naproxen, or ketoprofen unless instructed by your doctor. These medicines may hide afever. This medicine may increase your risk to bruise or bleed. Call your doctor orhealth care professional if you notice any unusual bleeding. Be careful brushing and flossing your teeth or using a toothpick because you may get an infection or bleed more easily. If you have any dental work done,tell your dentist you are receiving this medicine. Do not become pregnant while taking this medicine or for 6 months after stopping it. Women should inform their health care professional if they wish to become pregnant or think they might be pregnant. Men should not father a child while taking this medicine and for 3 months after stopping it. There is potential for serious side effects to an unborn child. Talk to your health careprofessional for more information. Do not breast-feed an infant while taking this medicine or for 7 days afterstopping it. This medicine has caused ovarian failure in some women. This medicine may make it more difficult to get pregnant. Talk to your health care professional if [place] concerned about your fertility. This medicine has caused decreased sperm counts in some men. This may make it more difficult to father a child. Talk to your health care professional if [place] concerned about your fertility. What side effects may I notice from receiving this medication? Side effects that you should report to your doctor or health care professionalas soon as possible: allergic reactions like skin rash, itching or hives, swelling of the face, lips, or tongue chest pain diarrhea flushing, runny nose, sweating during infusion low blood counts - this medicine may decrease the number of white blood cells, red blood cells and platelets. You may be at increased risk for infections and bleeding. nausea, vomiting pain,  swelling, warmth in the leg signs of decreased platelets or bleeding - bruising, pinpoint red spots on the skin, black, tarry stools, blood in the urine signs of infection - fever or chills, cough, sore throat, pain or difficulty passing urine signs of decreased red blood cells - unusually weak or tired, fainting spells, lightheadedness Side effects that usually do not require medical attention (report to yourdoctor or health care professional if they continue or are bothersome): constipation hair loss headache loss of appetite mouth sores stomach pain This list may not describe all possible side effects. Call your doctor for medical advice about side effects. You may report side effects to FDA at1-800-FDA-1088. Where should I keep my medication? This drug is given in a hospital or clinic and will not be stored at home. NOTE: This sheet is a summary. It may not cover all possible information. If you have questions about this medicine, talk to your doctor, pharmacist, orhealth care provider.  2022 Elsevier/Gold Standard (2019-10-06 17:46:13)  Leucovorin injection What is this medication? LEUCOVORIN (loo koe VOR in) is used to prevent or treat  the harmful effects of some medicines. This medicine is used to treat anemia caused by a low amount of folic acid in the body. It is also used with 5-fluorouracil (5-FU) to treatcolon cancer. This medicine may be used for other purposes; ask your health care provider orpharmacist if you have questions. What should I tell my care team before I take this medication? They need to know if you have any of these conditions: anemia from low levels of vitamin B-12 in the blood an unusual or allergic reaction to leucovorin, folic acid, other medicines, foods, dyes, or preservatives pregnant or trying to get pregnant breast-feeding How should I use this medication? This medicine is for injection into a muscle or into a vein. It is given by ahealth care  professional in a hospital or clinic setting. Talk to your pediatrician regarding the use of this medicine in children.Special care may be needed. Overdosage: If you think you have taken too much of this medicine contact apoison control center or emergency room at once. NOTE: This medicine is only for you. Do not share this medicine with others. What if I miss a dose? This does not apply. What may interact with this medication? capecitabine fluorouracil phenobarbital phenytoin primidone trimethoprim-sulfamethoxazole This list may not describe all possible interactions. Give your health care provider a list of all the medicines, herbs, non-prescription drugs, or dietary supplements you use. Also tell them if you smoke, drink alcohol, or use illegaldrugs. Some items may interact with your medicine. What should I watch for while using this medication? Your condition will be monitored carefully while you are receiving thismedicine. This medicine may increase the side effects of 5-fluorouracil, 5-FU. Tell your doctor or health care professional if you have diarrhea or mouth sores that donot get better or that get worse. What side effects may I notice from receiving this medication? Side effects that you should report to your doctor or health care professionalas soon as possible: allergic reactions like skin rash, itching or hives, swelling of the face, lips, or tongue breathing problems fever, infection mouth sores unusual bleeding or bruising unusually weak or tired Side effects that usually do not require medical attention (report to yourdoctor or health care professional if they continue or are bothersome): constipation or diarrhea loss of appetite nausea, vomiting This list may not describe all possible side effects. Call your doctor for medical advice about side effects. You may report side effects to FDA at1-800-FDA-1088. Where should I keep my medication? This drug is given in a  hospital or clinic and will not be stored at home. NOTE: This sheet is a summary. It may not cover all possible information. If you have questions about this medicine, talk to your doctor, pharmacist, orhealth care provider.  2022 Elsevier/Gold Standard (2008-05-11 16:50:29)  Fluorouracil, 5-FU injection What is this medication? FLUOROURACIL, 5-FU (flure oh YOOR a sil) is a chemotherapy drug. It slows the growth of cancer cells. This medicine is used to treat many types of cancer like breast cancer, colon or rectal cancer, pancreatic cancer, and stomachcancer. This medicine may be used for other purposes; ask your health care provider orpharmacist if you have questions. COMMON BRAND NAME(S): Adrucil What should I tell my care team before I take this medication? They need to know if you have any of these conditions: blood disorders dihydropyrimidine dehydrogenase (DPD) deficiency infection (especially a virus infection such as chickenpox, cold sores, or herpes) kidney disease liver disease malnourished, poor nutrition recent or ongoing radiation therapy an  unusual or allergic reaction to fluorouracil, other chemotherapy, other medicines, foods, dyes, or preservatives pregnant or trying to get pregnant breast-feeding How should I use this medication? This drug is given as an infusion or injection into a vein. It is administeredin a hospital or clinic by a specially trained health care professional. Talk to your pediatrician regarding the use of this medicine in children.Special care may be needed. Overdosage: If you think you have taken too much of this medicine contact apoison control center or emergency room at once. NOTE: This medicine is only for you. Do not share this medicine with others. What if I miss a dose? It is important not to miss your dose. Call your doctor or health careprofessional if you are unable to keep an appointment. What may interact with this medication? Do not  take this medicine with any of the following medications: live virus vaccines This medicine may also interact with the following medications: medicines that treat or prevent blood clots like warfarin, enoxaparin, and dalteparin This list may not describe all possible interactions. Give your health care provider a list of all the medicines, herbs, non-prescription drugs, or dietary supplements you use. Also tell them if you smoke, drink alcohol, or use illegaldrugs. Some items may interact with your medicine. What should I watch for while using this medication? Visit your doctor for checks on your progress. This drug may make you feel generally unwell. This is not uncommon, as chemotherapy can affect healthy cells as well as cancer cells. Report any side effects. Continue your course oftreatment even though you feel ill unless your doctor tells you to stop. In some cases, you may be given additional medicines to help with side effects.Follow all directions for their use. Call your doctor or health care professional for advice if you get a fever, chills or sore throat, or other symptoms of a cold or flu. Do not treat yourself. This drug decreases your body's ability to fight infections. Try toavoid being around people who are sick. This medicine may increase your risk to bruise or bleed. Call your doctor orhealth care professional if you notice any unusual bleeding. Be careful brushing and flossing your teeth or using a toothpick because you may get an infection or bleed more easily. If you have any dental work done,tell your dentist you are receiving this medicine. Avoid taking products that contain aspirin, acetaminophen, ibuprofen, naproxen, or ketoprofen unless instructed by your doctor. These medicines may hide afever. Do not become pregnant while taking this medicine. Women should inform their doctor if they wish to become pregnant or think they might be pregnant. There is a potential for serious  side effects to an unborn child. Talk to your health care professional or pharmacist for more information. Do not breast-feed aninfant while taking this medicine. Men should inform their doctor if they wish to father a child. This medicinemay lower sperm counts. Do not treat diarrhea with over the counter products. Contact your doctor ifyou have diarrhea that lasts more than 2 days or if it is severe and watery. This medicine can make you more sensitive to the sun. Keep out of the sun. If you cannot avoid being in the sun, wear protective clothing and use sunscreen.Do not use sun lamps or tanning beds/booths. What side effects may I notice from receiving this medication? Side effects that you should report to your doctor or health care professionalas soon as possible: allergic reactions like skin rash, itching or hives, swelling of the face, lips,  or tongue low blood counts - this medicine may decrease the number of white blood cells, red blood cells and platelets. You may be at increased risk for infections and bleeding. signs of infection - fever or chills, cough, sore throat, pain or difficulty passing urine signs of decreased platelets or bleeding - bruising, pinpoint red spots on the skin, black, tarry stools, blood in the urine signs of decreased red blood cells - unusually weak or tired, fainting spells, lightheadedness breathing problems changes in vision chest pain mouth sores nausea and vomiting pain, swelling, redness at site where injected pain, tingling, numbness in the hands or feet redness, swelling, or sores on hands or feet stomach pain unusual bleeding Side effects that usually do not require medical attention (report to yourdoctor or health care professional if they continue or are bothersome): changes in finger or toe nails diarrhea dry or itchy skin hair loss headache loss of appetite sensitivity of eyes to the light stomach upset unusually teary eyes This list may  not describe all possible side effects. Call your doctor for medical advice about side effects. You may report side effects to FDA at1-800-FDA-1088. Where should I keep my medication? This drug is given in a hospital or clinic and will not be stored at home. NOTE: This sheet is a summary. It may not cover all possible information. If you have questions about this medicine, talk to your doctor, pharmacist, orhealth care provider.  2022 Elsevier/Gold Standard (2019-10-06 15:00:03)

## 2021-05-30 NOTE — Progress Notes (Signed)
Kathy Robinson   Diagnosis: Colon cancer  INTERVAL HISTORY:   Kathy Robinson completed another cycle of FOLFIRI/Avastin on 05/09/2021.  She had 1 episode of nausea on day 3.  The nausea resolved when she took a dose of Decadron.  No mouth sores, diarrhea, or bleeding.  She feels well.  Good appetite.  She continues to have neuropathy symptoms in the hands and feet, but this has improved.  Objective:  Vital signs in last 24 hours:  Blood pressure 140/73, pulse 92, temperature 98.1 F (36.7 C), temperature source Oral, resp. rate 18, height 5' 6" (1.676 m), weight 145 lb 6.4 oz (66 kg), last menstrual period 11/19/1996, SpO2 100 %.    HEENT: No thrush or ulcers Resp: Lungs clear bilaterally Cardio: Regular rate and rhythm GI: No hepatosplenomegaly, nontender Vascular: No leg edema    Portacath/PICC-without erythema  Lab Results:  Lab Results  Component Value Date   WBC 5.9 05/30/2021   HGB 13.5 05/30/2021   HCT 40.8 05/30/2021   MCV 110.0 (H) 05/30/2021   PLT 217 05/30/2021   NEUTROABS 3.5 05/30/2021    CMP  Lab Results  Component Value Date   NA 141 05/30/2021   K 4.1 05/30/2021   CL 107 05/30/2021   CO2 26 05/30/2021   GLUCOSE 86 05/30/2021   BUN 22 05/30/2021   CREATININE 0.76 05/30/2021   CALCIUM 9.3 05/30/2021   PROT 6.5 05/30/2021   ALBUMIN 3.9 05/30/2021   AST 27 05/30/2021   ALT 34 05/30/2021   ALKPHOS 110 05/30/2021   BILITOT 0.4 05/30/2021   GFRNONAA >60 05/30/2021   GFRAA >60 08/16/2020    Lab Results  Component Value Date   CEA1 1.16 03/29/2021   CEA 1.84 05/09/2021    Medications: I have reviewed the patient's current medications.   Assessment/Plan: Adenocarcinoma the left colon, stage IIIc (pT4b,pN2a), status post a left colectomy 05/30/2020, MSS, TMB 13, BRAF V600E Tumor invades the visceral peritoneum, lymphovascular and perineural invasion present, for tumor deposits, 7/13 lymph nodes, negative  resection margins, no loss of mismatch repair protein expression CT abdomen/pelvis 05/26/2020-long segment of masslike thickening of the descending colon with associated high-grade colonic obstruction, small colonic wall defect with evidence of a focally contained microperforation, retroperitoneal adenopathy, indeterminate small hypodense liver lesions Elevated preoperative CEA PET 06/22/2020-hypermetabolic liver metastases, periportal and periaortic adenopathy, hypermetabolic mediastinal and left supraclavicular nodes Cycle 1 FOLFOX 07/05/20  Cycle 2 FOLFOXIRI (dose-reduced 5FU, no bolus) and Avastin 07/19/20  Cycle 3 FOLFOXIRI (Dose reduced 5-FU, no bolus)/Avastin 08/02/2020 Cycle 4 FOLFOXIRI/Avastin 08/16/2020 (Udenyca added) Cycle 5 FOLFOXIRI/Avastin 08/30/2020 CTs 09/09/2020-diminished size of lymph nodes in the chest, abdomen, and pelvis.  Decreased hepatic metastases.  No new evidence of disease progression, fat density lesion surrounding the left hemicolectomy Cycle 6 FOLFOXIRI/Avastin 09/13/2020 Cycle 7 FOLFOX/Avastin 09/27/2020 (irinotecan held) Cycle 8 FOLFOXIRI/Avastin 10/18/2020  Cycle 9 FOLFOXIRI/Avastin 11/01/2020 Cycle 10 FOLFOXIRI/Avastin 11/22/2020 (oxaliplatin held, irinotecan and 5-FU dose reduced) Cycle 11 FOLFOXIRI/Avastin 12/12/2020 (oxaliplatin held) CTs 12/19/2020-decrease in size of liver metastases, no evidence of disease progression Cycle 12 FOLFOXIRI/Avastin 01/04/2021 (oxaliplatin held) Cycle 13 FOLFOXIRI/Avastin 01/24/2021 (oxaliplatin held) Cycle 14 FOLFOXIRI/Avastin 02/14/2021 (oxaliplatin held) Cycle 15 FOLFOXIRI/Avastin 03/07/2021 (oxaliplatin held) Cycle 16 FOLFOXIRI/Avastin 03/29/2021 (oxaliplatin held) CTs 04/14/2021- decreased size of liver metastases.  No new or progressive findings. Cycle 17 FOLFOXIRI/Avastin 04/19/2021 (oxaliplatin held) Cycle 18 FOLFOXIRI/Avastin 05/09/2021 (oxaliplatin held) Cycle 19 FOLFOXIRI/Avastin 05/30/2021 (oxaliplatin held) Atrial fibrillation  with rapid ventricular response 05/26/2020 Mild neutropenia following cycle 3  FOLFOXIRI, Udenyca added with cycle 4 Hypokalemia secondary to diarrhea-potassium supplementation starting 09/13/2020 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  She continues to tolerate the FOLFIRI/Avastin well.  She will complete another cycle today.  She will take Decadron again on day 3 as needed.  She will return for an office visit and chemotherapy in 3 weeks.  We will plan for a restaging CT in late August or September.  [name], MD  05/30/2021  10:04 AM

## 2021-06-01 ENCOUNTER — Other Ambulatory Visit: Payer: Self-pay

## 2021-06-01 ENCOUNTER — Inpatient Hospital Stay: Payer: Medicare Other

## 2021-06-01 VITALS — BP 130/74 | HR 69 | Temp 98.1°F | Resp 18

## 2021-06-01 DIAGNOSIS — G629 Polyneuropathy, unspecified: Secondary | ICD-10-CM | POA: Diagnosis not present

## 2021-06-01 DIAGNOSIS — C185 Malignant neoplasm of splenic flexure: Secondary | ICD-10-CM | POA: Diagnosis not present

## 2021-06-01 DIAGNOSIS — E876 Hypokalemia: Secondary | ICD-10-CM | POA: Diagnosis not present

## 2021-06-01 DIAGNOSIS — I4891 Unspecified atrial fibrillation: Secondary | ICD-10-CM | POA: Diagnosis not present

## 2021-06-01 DIAGNOSIS — C787 Secondary malignant neoplasm of liver and intrahepatic bile duct: Secondary | ICD-10-CM | POA: Diagnosis not present

## 2021-06-01 DIAGNOSIS — Z5111 Encounter for antineoplastic chemotherapy: Secondary | ICD-10-CM | POA: Diagnosis not present

## 2021-06-01 DIAGNOSIS — R197 Diarrhea, unspecified: Secondary | ICD-10-CM | POA: Diagnosis not present

## 2021-06-01 DIAGNOSIS — R110 Nausea: Secondary | ICD-10-CM | POA: Diagnosis not present

## 2021-06-01 DIAGNOSIS — Z79899 Other long term (current) drug therapy: Secondary | ICD-10-CM | POA: Diagnosis not present

## 2021-06-01 DIAGNOSIS — D709 Neutropenia, unspecified: Secondary | ICD-10-CM | POA: Diagnosis not present

## 2021-06-01 MED ORDER — HEPARIN SOD (PORK) LOCK FLUSH 100 UNIT/ML IV SOLN
500.0000 [IU] | Freq: Once | INTRAVENOUS | Status: AC | PRN
  Administered 2021-06-01: 500 [IU]
  Filled 2021-06-01: qty 5

## 2021-06-01 MED ORDER — PEGFILGRASTIM-CBQV 6 MG/0.6ML ~~LOC~~ SOSY
6.0000 mg | PREFILLED_SYRINGE | Freq: Once | SUBCUTANEOUS | Status: AC
  Administered 2021-06-01: 6 mg via SUBCUTANEOUS

## 2021-06-01 MED ORDER — SODIUM CHLORIDE 0.9% FLUSH
10.0000 mL | INTRAVENOUS | Status: DC | PRN
  Administered 2021-06-01: 10 mL
  Filled 2021-06-01: qty 10

## 2021-06-01 NOTE — Patient Instructions (Signed)

## 2021-06-04 DIAGNOSIS — C185 Malignant neoplasm of splenic flexure: Secondary | ICD-10-CM | POA: Diagnosis not present

## 2021-06-04 DIAGNOSIS — C186 Malignant neoplasm of descending colon: Secondary | ICD-10-CM | POA: Diagnosis not present

## 2021-06-16 ENCOUNTER — Other Ambulatory Visit: Payer: Self-pay | Admitting: Oncology

## 2021-06-20 ENCOUNTER — Other Ambulatory Visit: Payer: Self-pay

## 2021-06-20 ENCOUNTER — Inpatient Hospital Stay: Payer: Medicare Other

## 2021-06-20 ENCOUNTER — Inpatient Hospital Stay: Payer: Medicare Other | Attending: Oncology

## 2021-06-20 ENCOUNTER — Inpatient Hospital Stay: Payer: Medicare Other | Admitting: Nurse Practitioner

## 2021-06-20 ENCOUNTER — Other Ambulatory Visit (HOSPITAL_COMMUNITY): Payer: Self-pay

## 2021-06-20 ENCOUNTER — Encounter: Payer: Self-pay | Admitting: Nurse Practitioner

## 2021-06-20 VITALS — BP 137/69 | HR 88 | Temp 97.8°F | Resp 20 | Ht 66.0 in | Wt 146.6 lb

## 2021-06-20 DIAGNOSIS — C185 Malignant neoplasm of splenic flexure: Secondary | ICD-10-CM

## 2021-06-20 DIAGNOSIS — C787 Secondary malignant neoplasm of liver and intrahepatic bile duct: Secondary | ICD-10-CM | POA: Insufficient documentation

## 2021-06-20 DIAGNOSIS — Z5111 Encounter for antineoplastic chemotherapy: Secondary | ICD-10-CM | POA: Diagnosis not present

## 2021-06-20 DIAGNOSIS — Z5189 Encounter for other specified aftercare: Secondary | ICD-10-CM | POA: Diagnosis not present

## 2021-06-20 DIAGNOSIS — R197 Diarrhea, unspecified: Secondary | ICD-10-CM | POA: Diagnosis not present

## 2021-06-20 DIAGNOSIS — I482 Chronic atrial fibrillation, unspecified: Secondary | ICD-10-CM | POA: Insufficient documentation

## 2021-06-20 DIAGNOSIS — T451X5A Adverse effect of antineoplastic and immunosuppressive drugs, initial encounter: Secondary | ICD-10-CM | POA: Diagnosis not present

## 2021-06-20 DIAGNOSIS — E876 Hypokalemia: Secondary | ICD-10-CM | POA: Insufficient documentation

## 2021-06-20 DIAGNOSIS — D709 Neutropenia, unspecified: Secondary | ICD-10-CM | POA: Diagnosis not present

## 2021-06-20 DIAGNOSIS — G620 Drug-induced polyneuropathy: Secondary | ICD-10-CM | POA: Insufficient documentation

## 2021-06-20 LAB — CBC WITH DIFFERENTIAL (CANCER CENTER ONLY)
Abs Immature Granulocytes: 0.01 10*3/uL (ref 0.00–0.07)
Basophils Absolute: 0.1 10*3/uL (ref 0.0–0.1)
Basophils Relative: 1 %
Eosinophils Absolute: 0.3 10*3/uL (ref 0.0–0.5)
Eosinophils Relative: 5 %
HCT: 39.7 % (ref 36.0–46.0)
Hemoglobin: 13.2 g/dL (ref 12.0–15.0)
Immature Granulocytes: 0 %
Lymphocytes Relative: 31 %
Lymphs Abs: 1.8 10*3/uL (ref 0.7–4.0)
MCH: 36.2 pg — ABNORMAL HIGH (ref 26.0–34.0)
MCHC: 33.2 g/dL (ref 30.0–36.0)
MCV: 108.8 fL — ABNORMAL HIGH (ref 80.0–100.0)
Monocytes Absolute: 0.8 10*3/uL (ref 0.1–1.0)
Monocytes Relative: 13 %
Neutro Abs: 3 10*3/uL (ref 1.7–7.7)
Neutrophils Relative %: 50 %
Platelet Count: 217 10*3/uL (ref 150–400)
RBC: 3.65 MIL/uL — ABNORMAL LOW (ref 3.87–5.11)
RDW: 13.6 % (ref 11.5–15.5)
WBC Count: 5.9 10*3/uL (ref 4.0–10.5)
nRBC: 0 % (ref 0.0–0.2)

## 2021-06-20 LAB — CMP (CANCER CENTER ONLY)
ALT: 30 U/L (ref 0–44)
AST: 23 U/L (ref 15–41)
Albumin: 3.9 g/dL (ref 3.5–5.0)
Alkaline Phosphatase: 89 U/L (ref 38–126)
Anion gap: 7 (ref 5–15)
BUN: 27 mg/dL — ABNORMAL HIGH (ref 8–23)
CO2: 27 mmol/L (ref 22–32)
Calcium: 8.9 mg/dL (ref 8.9–10.3)
Chloride: 105 mmol/L (ref 98–111)
Creatinine: 0.84 mg/dL (ref 0.44–1.00)
GFR, Estimated: >60 mL/min (ref >60)
Glucose, Bld: 74 mg/dL (ref 70–99)
Potassium: 4.3 mmol/L (ref 3.5–5.1)
Sodium: 139 mmol/L (ref 135–145)
Total Bilirubin: 0.4 mg/dL (ref 0.3–1.2)
Total Protein: 6.4 g/dL — ABNORMAL LOW (ref 6.5–8.1)

## 2021-06-20 LAB — MAGNESIUM: Magnesium: 1.9 mg/dL (ref 1.7–2.4)

## 2021-06-20 MED ORDER — SODIUM CHLORIDE 0.9 % IV SOLN
10.0000 mg | Freq: Once | INTRAVENOUS | Status: AC
  Administered 2021-06-20: 10 mg via INTRAVENOUS
  Filled 2021-06-20: qty 1

## 2021-06-20 MED ORDER — SODIUM CHLORIDE 0.9 % IV SOLN
400.0000 mg/m2 | Freq: Once | INTRAVENOUS | Status: AC
  Administered 2021-06-20: 660 mg via INTRAVENOUS
  Filled 2021-06-20: qty 33

## 2021-06-20 MED ORDER — SODIUM CHLORIDE 0.9 % IV SOLN
INTRAVENOUS | Status: DC
  Filled 2021-06-20: qty 250

## 2021-06-20 MED ORDER — SODIUM CHLORIDE 0.9 % IV SOLN
Freq: Once | INTRAVENOUS | Status: AC
  Filled 2021-06-20: qty 250

## 2021-06-20 MED ORDER — PALONOSETRON HCL INJECTION 0.25 MG/5ML
0.2500 mg | Freq: Once | INTRAVENOUS | Status: AC
  Administered 2021-06-20: 0.25 mg via INTRAVENOUS
  Filled 2021-06-20: qty 5

## 2021-06-20 MED ORDER — SODIUM CHLORIDE 0.9 % IV SOLN
150.0000 mg | Freq: Once | INTRAVENOUS | Status: AC
  Administered 2021-06-20: 150 mg via INTRAVENOUS
  Filled 2021-06-20: qty 5

## 2021-06-20 MED ORDER — BEVACIZUMAB-BVZR CHEMO INJECTION 400 MG/16ML
450.0000 mg | Freq: Once | INTRAVENOUS | Status: AC
  Administered 2021-06-20: 450 mg via INTRAVENOUS
  Filled 2021-06-20: qty 4

## 2021-06-20 MED ORDER — SODIUM CHLORIDE 0.9 % IV SOLN
1600.0000 mg/m2 | INTRAVENOUS | Status: DC
  Administered 2021-06-20: 2650 mg via INTRAVENOUS
  Filled 2021-06-20: qty 53

## 2021-06-20 MED ORDER — SODIUM CHLORIDE 0.9 % IV SOLN
100.0000 mg/m2 | Freq: Once | INTRAVENOUS | Status: AC
  Administered 2021-06-20: 160 mg via INTRAVENOUS
  Filled 2021-06-20: qty 8

## 2021-06-20 MED ORDER — POTASSIUM CHLORIDE ER 10 MEQ PO CPCR
ORAL_CAPSULE | Freq: Two times a day (BID) | ORAL | 2 refills | Status: DC
  Filled 2021-06-20: qty 60, 30d supply, fill #0
  Filled 2021-08-03: qty 60, 30d supply, fill #1
  Filled 2021-09-11: qty 60, 30d supply, fill #2

## 2021-06-20 MED ORDER — ATROPINE SULFATE 1 MG/ML IJ SOLN
0.5000 mg | Freq: Once | INTRAMUSCULAR | Status: AC | PRN
  Administered 2021-06-20: 0.5 mg via INTRAVENOUS
  Filled 2021-06-20: qty 1

## 2021-06-20 NOTE — Patient Instructions (Signed)
[facility]  Discharge Instructions: Thank you for choosing [facility] to provide your oncology and hematology care.   If you have a lab appointment with the [facility], please go directly to the [facility] and check in at the registration area.   Wear comfortable clothing and clothing appropriate for easy access to any Portacath or PICC line.   We strive to give you quality time with your provider. You may need to reschedule your appointment if you arrive late (15 or more minutes).  Arriving late affects you and other patients whose appointments are after yours.  Also, if you miss three or more appointments without notifying the office, you may be dismissed from the clinic at the provider's discretion.      For prescription refill requests, have your pharmacy contact our office and allow 72 hours for refills to be completed.    Today you received the following chemotherapy and/or immunotherapy agents Avastin, Irinotecan, Leucovorin, 5FU      To help prevent nausea and vomiting after your treatment, we encourage you to take your nausea medication as directed.  BELOW ARE SYMPTOMS THAT SHOULD BE REPORTED IMMEDIATELY: *FEVER GREATER THAN 100.4 F (38 C) OR HIGHER *CHILLS OR SWEATING *NAUSEA AND VOMITING THAT IS NOT CONTROLLED WITH YOUR NAUSEA MEDICATION *UNUSUAL SHORTNESS OF BREATH *UNUSUAL BRUISING OR BLEEDING *URINARY PROBLEMS (pain or burning when urinating, or frequent urination) *BOWEL PROBLEMS (unusual diarrhea, constipation, pain near the anus) TENDERNESS IN MOUTH AND THROAT WITH OR WITHOUT PRESENCE OF ULCERS (sore throat, sores in mouth, or a toothache) UNUSUAL RASH, SWELLING OR PAIN  UNUSUAL VAGINAL DISCHARGE OR ITCHING   Items with * indicate a potential emergency and should be followed up as soon as possible or go to the Emergency Department if any problems should occur.  Please show the CHEMOTHERAPY ALERT CARD or IMMUNOTHERAPY  ALERT CARD at check-in to the Emergency Department and triage nurse.  Should you have questions after your visit or need to cancel or reschedule your appointment, please contact [facility]  Dept: [phone]  and follow the prompts.  Office hours are 8:00 a.m. to 4:30 p.m. Monday - Friday. Please note that voicemails left after 4:00 p.m. may not be returned until the following business day.  We are closed weekends and major holidays. You have access to a nurse at all times for urgent questions. Please call the main number to the clinic Dept: [phone] and follow the prompts.   For any non-urgent questions, you may also contact your provider using MyChart. We now offer e-Visits for anyone 79 and older to request care online for non-urgent symptoms. For details visit [email or web address].   Also download the MyChart app! Go to the app store, search "MyChart", open the app, select [facility], and log in with your MyChart username and password.  Due to Covid, a mask is required upon entering the hospital/clinic. If you do not have a mask, one will be given to you upon arrival. For doctor visits, patients may have 1 support person aged 24 or older with them. For treatment visits, patients cannot have anyone with them due to current Covid guidelines and our immunocompromised population.

## 2021-06-20 NOTE — Progress Notes (Signed)
Per Ned Card, NP, ok to proceed with Avastin today without urine protein. Last urine protein was negative on 7/12 with last cycle of treatment.

## 2021-06-20 NOTE — Progress Notes (Signed)
[facility] OFFICE PROGRESS NOTE   Diagnosis: Colon cancer  INTERVAL HISTORY:   Kathy Robinson returns as scheduled.  She completed another cycle of FOLFIRI/Avastin 05/30/2021.  She denies significant nausea.  No mouth sores.  Minimal diarrhea.  No bleeding.  No leg swelling or calf pain.  She occasionally notes swelling at the right foot.  No abdominal pain.  She has a good appetite.  Objective:  Vital signs in last 24 hours:  Blood pressure 137/69, pulse 88, temperature 97.8 F (36.6 C), temperature source Oral, resp. rate 20, height _0  (1.676 m), weight 146 lb 9.6 oz (66.5 kg), last menstrual period 11/19/1996, SpO2 100 %.    HEENT: No thrush or ulcers. Resp: Lungs clear bilaterally. Cardio: Regular rate and rhythm. GI: Abdomen soft and nontender.  No hepatomegaly. Vascular: No leg edema. Skin: Palms without erythema. Port-A-Cath without erythema.   Lab Results:  Lab Results  Component Value Date   WBC 5.9 06/20/2021   HGB 13.2 06/20/2021   HCT 39.7 06/20/2021   MCV 108.8 (H) 06/20/2021   PLT 217 06/20/2021   NEUTROABS 3.0 06/20/2021    Imaging:  No results found.  Medications: I have reviewed the patient's current medications.  Assessment/Plan: Adenocarcinoma the left colon, stage IIIc (pT4b,pN2a), status post a left colectomy 05/30/2020, MSS, TMB 13, BRAF V600E Tumor invades the visceral peritoneum, lymphovascular and perineural invasion present, for tumor deposits, 7/13 lymph nodes, negative resection margins, no loss of mismatch repair protein expression CT abdomen/pelvis 05/26/2020-long segment of masslike thickening of the descending colon with associated high-grade colonic obstruction, small colonic wall defect with evidence of a focally contained microperforation, retroperitoneal adenopathy, indeterminate small hypodense liver lesions Elevated preoperative CEA PET 06/22/2020-hypermetabolic liver metastases, periportal and periaortic adenopathy,  hypermetabolic mediastinal and left supraclavicular nodes Cycle 1 FOLFOX 07/05/20 Cycle 2 FOLFOXIRI (dose-reduced 5FU, no bolus) and Avastin 07/19/20  Cycle 3 FOLFOXIRI (Dose reduced 5-FU, no bolus)/Avastin 08/02/2020 Cycle 4 FOLFOXIRI/Avastin 08/16/2020 (Udenyca added) Cycle 5 FOLFOXIRI/Avastin 08/30/2020 CTs 09/09/2020-diminished size of lymph nodes in the chest, abdomen, and pelvis.  Decreased hepatic metastases.  No new evidence of disease progression, fat density lesion surrounding the left hemicolectomy Cycle 6 FOLFOXIRI/Avastin 09/13/2020 Cycle 7 FOLFOX/Avastin 09/27/2020 (irinotecan held) Cycle 8 FOLFOXIRI/Avastin 10/18/2020  Cycle 9 FOLFOXIRI/Avastin 11/01/2020 Cycle 10 FOLFOXIRI/Avastin 11/22/2020 (oxaliplatin held, irinotecan and 5-FU dose reduced) Cycle 11 FOLFOXIRI/Avastin 12/12/2020 (oxaliplatin held) CTs 12/19/2020-decrease in size of liver metastases, no evidence of disease progression Cycle 12 FOLFOXIRI/Avastin 01/04/2021 (oxaliplatin held) Cycle 13 FOLFOXIRI/Avastin 01/24/2021 (oxaliplatin held) Cycle 14 FOLFOXIRI/Avastin 02/14/2021 (oxaliplatin held) Cycle 15 FOLFOXIRI/Avastin 03/07/2021 (oxaliplatin held) Cycle 16 FOLFOXIRI/Avastin 03/29/2021 (oxaliplatin held) CTs 04/14/2021- decreased size of liver metastases.  No new or progressive findings. Cycle 17 FOLFOXIRI/Avastin 04/19/2021 (oxaliplatin held) Cycle 18 FOLFOXIRI/Avastin 05/09/2021 (oxaliplatin held) Cycle 19 FOLFOXIRI/Avastin 05/30/2021 (oxaliplatin held) Cycle 20 FOLFOXIRI/Avastin 06/20/2021 (oxaliplatin held) Atrial fibrillation with rapid ventricular response 05/26/2020 Mild neutropenia following cycle 3 FOLFOXIRI, Udenyca added with cycle 4 Hypokalemia secondary to diarrhea-potassium supplementation starting 09/13/2020 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  She is on active  treatment with FOLFIRI/Avastin.  She is tolerating treatment well.  Plan to proceed with cycle 20 today as scheduled.  We reviewed the CBC from today.  Counts adequate to proceed with treatment.  She will return for lab, follow-up, treatment in 3 weeks.  We are available to see her sooner if  needed.    Ned Card ANP/GNP-BC   06/20/2021  8:51 AM

## 2021-06-20 NOTE — Patient Instructions (Signed)
Implanted Port Home Guide An implanted port is a device that is placed under the skin. It is usually placed in the chest. The device can be used to give IV medicine, to take blood, or for dialysis. You may have an implanted port if: You need IV medicine that would be irritating to the small veins in your hands or arms. You need IV medicines, such as antibiotics, for a long period of time. You need IV nutrition for a long period of time. You need dialysis. When you have a port, your health care provider can choose to use the port instead of veins in your arms for these procedures. You may have fewer limitations when using a port than you would if you used other types of long-term IVs, and you will likely be able to return to normal activities afteryour incision heals. An implanted port has two main parts: Reservoir. The reservoir is the part where a needle is inserted to give medicines or draw blood. The reservoir is round. After it is placed, it appears as a small, raised area under your skin. Catheter. The catheter is a thin, flexible tube that connects the reservoir to a vein. Medicine that is inserted into the reservoir goes into the catheter and then into the vein. How is my port accessed? To access your port: A numbing cream may be placed on the skin over the port site. Your health care provider will put on a mask and sterile gloves. The skin over your port will be cleaned carefully with a germ-killing soap and allowed to dry. Your health care provider will gently pinch the port and insert a needle into it. Your health care provider will check for a blood return to make sure the port is in the vein and is not clogged. If your port needs to remain accessed to get medicine continuously (constant infusion), your health care provider will place a clear bandage (dressing) over the needle site. The dressing and needle will need to be changed every week, or as told by your health care provider. What  is flushing? Flushing helps keep the port from getting clogged. Follow instructions from your health care provider about how and when to flush the port. Ports are usually flushed with saline solution or a medicine called heparin. The need for flushing will depend on how the port is used: If the port is only used from time to time to give medicines or draw blood, the port may need to be flushed: Before and after medicines have been given. Before and after blood has been drawn. As part of routine maintenance. Flushing may be recommended every 4-6 weeks. If a constant infusion is running, the port may not need to be flushed. Throw away any syringes in a disposal container that is meant for sharp items (sharps container). You can buy a sharps container from a pharmacy, or you can make one by using an empty hard plastic bottle with a cover. How long will my port stay implanted? The port can stay in for as long as your health care provider thinks it is needed. When it is time for the port to come out, a surgery will be done to remove it. The surgery will be similar to the procedure that was done to putthe port in. Follow these instructions at home:  Flush your port as told by your health care provider. If you need an infusion over several days, follow instructions from your health care provider about how to take   care of your port site. Make sure you: Wash your hands with soap and water before you change your dressing. If soap and water are not available, use alcohol-based hand sanitizer. Change your dressing as told by your health care provider. Place any used dressings or infusion bags into a plastic bag. Throw that bag in the trash. Keep the dressing that covers the needle clean and dry. Do not get it wet. Do not use scissors or sharp objects near the tube. Keep the tube clamped, unless it is being used. Check your port site every day for signs of infection. Check for: Redness, swelling, or  pain. Fluid or blood. Pus or a bad smell. Protect the skin around the port site. Avoid wearing bra straps that rub or irritate the site. Protect the skin around your port from seat belts. Place a soft pad over your chest if needed. Bathe or shower as told by your health care provider. The site may get wet as long as you are not actively receiving an infusion. Return to your normal activities as told by your health care provider. Ask your health care provider what activities are safe for you. Carry a medical alert card or wear a medical alert bracelet at all times. This will let health care providers know that you have an implanted port in case of an emergency. Get help right away if: You have redness, swelling, or pain at the port site. You have fluid or blood coming from your port site. You have pus or a bad smell coming from the port site. You have a fever. Summary Implanted ports are usually placed in the chest for long-term IV access. Follow instructions from your health care provider about flushing the port and changing bandages (dressings). Take care of the area around your port by avoiding clothing that puts pressure on the area, and by watching for signs of infection. Protect the skin around your port from seat belts. Place a soft pad over your chest if needed. Get help right away if you have a fever or you have redness, swelling, pain, drainage, or a bad smell at the port site. This information is not intended to replace advice given to you by your health care provider. Make sure you discuss any questions you have with your healthcare provider. Document Revised: 03/21/2020 Document Reviewed: 03/21/2020 [employer] Patient Education  2022 [employer]  

## 2021-06-22 ENCOUNTER — Other Ambulatory Visit: Payer: Self-pay

## 2021-06-22 ENCOUNTER — Inpatient Hospital Stay: Payer: Medicare Other

## 2021-06-22 VITALS — BP 146/63 | HR 88 | Temp 98.9°F | Resp 18

## 2021-06-22 DIAGNOSIS — R197 Diarrhea, unspecified: Secondary | ICD-10-CM | POA: Diagnosis not present

## 2021-06-22 DIAGNOSIS — C185 Malignant neoplasm of splenic flexure: Secondary | ICD-10-CM | POA: Diagnosis not present

## 2021-06-22 DIAGNOSIS — D709 Neutropenia, unspecified: Secondary | ICD-10-CM | POA: Diagnosis not present

## 2021-06-22 DIAGNOSIS — Z5189 Encounter for other specified aftercare: Secondary | ICD-10-CM | POA: Diagnosis not present

## 2021-06-22 DIAGNOSIS — T451X5A Adverse effect of antineoplastic and immunosuppressive drugs, initial encounter: Secondary | ICD-10-CM | POA: Diagnosis not present

## 2021-06-22 DIAGNOSIS — E876 Hypokalemia: Secondary | ICD-10-CM | POA: Diagnosis not present

## 2021-06-22 DIAGNOSIS — G620 Drug-induced polyneuropathy: Secondary | ICD-10-CM | POA: Diagnosis not present

## 2021-06-22 DIAGNOSIS — C787 Secondary malignant neoplasm of liver and intrahepatic bile duct: Secondary | ICD-10-CM | POA: Diagnosis not present

## 2021-06-22 DIAGNOSIS — Z5111 Encounter for antineoplastic chemotherapy: Secondary | ICD-10-CM | POA: Diagnosis not present

## 2021-06-22 DIAGNOSIS — I482 Chronic atrial fibrillation, unspecified: Secondary | ICD-10-CM | POA: Diagnosis not present

## 2021-06-22 MED ORDER — HEPARIN SOD (PORK) LOCK FLUSH 100 UNIT/ML IV SOLN
500.0000 [IU] | Freq: Once | INTRAVENOUS | Status: AC | PRN
  Administered 2021-06-22: 500 [IU]
  Filled 2021-06-22: qty 5

## 2021-06-22 MED ORDER — PEGFILGRASTIM-CBQV 6 MG/0.6ML ~~LOC~~ SOSY
6.0000 mg | PREFILLED_SYRINGE | Freq: Once | SUBCUTANEOUS | Status: AC
  Administered 2021-06-22: 6 mg via SUBCUTANEOUS

## 2021-06-22 MED ORDER — SODIUM CHLORIDE 0.9% FLUSH
10.0000 mL | INTRAVENOUS | Status: DC | PRN
  Administered 2021-06-22: 10 mL
  Filled 2021-06-22: qty 10

## 2021-06-22 NOTE — Patient Instructions (Signed)

## 2021-07-09 ENCOUNTER — Other Ambulatory Visit: Payer: Self-pay | Admitting: Oncology

## 2021-07-11 ENCOUNTER — Inpatient Hospital Stay: Payer: Medicare Other

## 2021-07-11 ENCOUNTER — Inpatient Hospital Stay (HOSPITAL_BASED_OUTPATIENT_CLINIC_OR_DEPARTMENT_OTHER): Payer: Medicare Other | Admitting: Nurse Practitioner

## 2021-07-11 ENCOUNTER — Encounter: Payer: Self-pay | Admitting: Nurse Practitioner

## 2021-07-11 ENCOUNTER — Other Ambulatory Visit: Payer: Self-pay

## 2021-07-11 VITALS — BP 130/70 | HR 80 | Temp 98.1°F | Resp 20 | Ht 66.0 in | Wt 145.8 lb

## 2021-07-11 DIAGNOSIS — C787 Secondary malignant neoplasm of liver and intrahepatic bile duct: Secondary | ICD-10-CM | POA: Diagnosis not present

## 2021-07-11 DIAGNOSIS — E876 Hypokalemia: Secondary | ICD-10-CM | POA: Diagnosis not present

## 2021-07-11 DIAGNOSIS — C185 Malignant neoplasm of splenic flexure: Secondary | ICD-10-CM

## 2021-07-11 DIAGNOSIS — R197 Diarrhea, unspecified: Secondary | ICD-10-CM | POA: Diagnosis not present

## 2021-07-11 DIAGNOSIS — Z5111 Encounter for antineoplastic chemotherapy: Secondary | ICD-10-CM | POA: Diagnosis not present

## 2021-07-11 DIAGNOSIS — G620 Drug-induced polyneuropathy: Secondary | ICD-10-CM | POA: Diagnosis not present

## 2021-07-11 DIAGNOSIS — Z5189 Encounter for other specified aftercare: Secondary | ICD-10-CM | POA: Diagnosis not present

## 2021-07-11 DIAGNOSIS — T451X5A Adverse effect of antineoplastic and immunosuppressive drugs, initial encounter: Secondary | ICD-10-CM | POA: Diagnosis not present

## 2021-07-11 DIAGNOSIS — I482 Chronic atrial fibrillation, unspecified: Secondary | ICD-10-CM | POA: Diagnosis not present

## 2021-07-11 DIAGNOSIS — D709 Neutropenia, unspecified: Secondary | ICD-10-CM | POA: Diagnosis not present

## 2021-07-11 LAB — CBC WITH DIFFERENTIAL (CANCER CENTER ONLY)
Abs Immature Granulocytes: 0.01 10*3/uL (ref 0.00–0.07)
Basophils Absolute: 0 10*3/uL (ref 0.0–0.1)
Basophils Relative: 1 %
Eosinophils Absolute: 0.3 10*3/uL (ref 0.0–0.5)
Eosinophils Relative: 5 %
HCT: 40.7 % (ref 36.0–46.0)
Hemoglobin: 13.4 g/dL (ref 12.0–15.0)
Immature Granulocytes: 0 %
Lymphocytes Relative: 32 %
Lymphs Abs: 1.9 10*3/uL (ref 0.7–4.0)
MCH: 36.2 pg — ABNORMAL HIGH (ref 26.0–34.0)
MCHC: 32.9 g/dL (ref 30.0–36.0)
MCV: 110 fL — ABNORMAL HIGH (ref 80.0–100.0)
Monocytes Absolute: 0.7 10*3/uL (ref 0.1–1.0)
Monocytes Relative: 12 %
Neutro Abs: 2.9 10*3/uL (ref 1.7–7.7)
Neutrophils Relative %: 50 %
Platelet Count: 198 10*3/uL (ref 150–400)
RBC: 3.7 MIL/uL — ABNORMAL LOW (ref 3.87–5.11)
RDW: 13.8 % (ref 11.5–15.5)
WBC Count: 5.8 10*3/uL (ref 4.0–10.5)
nRBC: 0 % (ref 0.0–0.2)

## 2021-07-11 LAB — CMP (CANCER CENTER ONLY)
ALT: 31 U/L (ref 0–44)
AST: 32 U/L (ref 15–41)
Albumin: 3.6 g/dL (ref 3.5–5.0)
Alkaline Phosphatase: 99 U/L (ref 38–126)
Anion gap: 10 (ref 5–15)
BUN: 23 mg/dL (ref 8–23)
CO2: 24 mmol/L (ref 22–32)
Calcium: 8.9 mg/dL (ref 8.9–10.3)
Chloride: 105 mmol/L (ref 98–111)
Creatinine: 0.83 mg/dL (ref 0.44–1.00)
GFR, Estimated: >60 mL/min (ref >60)
Glucose, Bld: 100 mg/dL — ABNORMAL HIGH (ref 70–99)
Potassium: 4.3 mmol/L (ref 3.5–5.1)
Sodium: 139 mmol/L (ref 135–145)
Total Bilirubin: 0.3 mg/dL (ref 0.3–1.2)
Total Protein: 6.5 g/dL (ref 6.5–8.1)

## 2021-07-11 LAB — TOTAL PROTEIN, URINE DIPSTICK: Protein, ur: NEGATIVE mg/dL

## 2021-07-11 LAB — CEA (ACCESS): CEA (CHCC): 1.56 ng/mL (ref 0.00–5.00)

## 2021-07-11 MED ORDER — ATROPINE SULFATE 1 MG/ML IJ SOLN
0.5000 mg | Freq: Once | INTRAMUSCULAR | Status: AC | PRN
  Administered 2021-07-11: 0.5 mg via INTRAVENOUS

## 2021-07-11 MED ORDER — SODIUM CHLORIDE 0.9 % IV SOLN
150.0000 mg | Freq: Once | INTRAVENOUS | Status: AC
  Administered 2021-07-11: 150 mg via INTRAVENOUS
  Filled 2021-07-11: qty 5

## 2021-07-11 MED ORDER — ATROPINE SULFATE 1 MG/ML IJ SOLN
INTRAMUSCULAR | Status: AC
  Filled 2021-07-11: qty 1

## 2021-07-11 MED ORDER — SODIUM CHLORIDE 0.9% FLUSH
10.0000 mL | INTRAVENOUS | Status: DC | PRN
  Administered 2021-07-11: 10 mL

## 2021-07-11 MED ORDER — SODIUM CHLORIDE 0.9 % IV SOLN
450.0000 mg | Freq: Once | INTRAVENOUS | Status: AC
  Administered 2021-07-11: 450 mg via INTRAVENOUS
  Filled 2021-07-11: qty 16

## 2021-07-11 MED ORDER — SODIUM CHLORIDE 0.9 % IV SOLN
10.0000 mg | Freq: Once | INTRAVENOUS | Status: AC
  Administered 2021-07-11: 10 mg via INTRAVENOUS
  Filled 2021-07-11: qty 1

## 2021-07-11 MED ORDER — SODIUM CHLORIDE 0.9 % IV SOLN
1600.0000 mg/m2 | INTRAVENOUS | Status: DC
  Administered 2021-07-11: 2650 mg via INTRAVENOUS
  Filled 2021-07-11: qty 53

## 2021-07-11 MED ORDER — SODIUM CHLORIDE 0.9 % IV SOLN
100.0000 mg/m2 | Freq: Once | INTRAVENOUS | Status: AC
  Administered 2021-07-11: 160 mg via INTRAVENOUS
  Filled 2021-07-11: qty 8

## 2021-07-11 MED ORDER — PALONOSETRON HCL INJECTION 0.25 MG/5ML
0.2500 mg | Freq: Once | INTRAVENOUS | Status: AC
  Administered 2021-07-11: 0.25 mg via INTRAVENOUS
  Filled 2021-07-11: qty 5

## 2021-07-11 MED ORDER — SODIUM CHLORIDE 0.9 % IV SOLN: Freq: Once | INTRAVENOUS | Status: AC

## 2021-07-11 MED ORDER — SODIUM CHLORIDE 0.9 % IV SOLN
400.0000 mg/m2 | Freq: Once | INTRAVENOUS | Status: AC
  Administered 2021-07-11: 660 mg via INTRAVENOUS
  Filled 2021-07-11: qty 33

## 2021-07-11 NOTE — Patient Instructions (Signed)
Implanted Port Home Guide An implanted port is a device that is placed under the skin. It is usually placed in the chest. The device can be used to give IV medicine, to take blood, or for dialysis. You may have an implanted port if: You need IV medicine that would be irritating to the small veins in your hands or arms. You need IV medicines, such as antibiotics, for a long period of time. You need IV nutrition for a long period of time. You need dialysis. When you have a port, your health care provider can choose to use the port instead of veins in your arms for these procedures. You may have fewer limitations when using a port than you would if you used other types of long-term IVs, and you will likely be able to return to normal activities afteryour incision heals. An implanted port has two main parts: Reservoir. The reservoir is the part where a needle is inserted to give medicines or draw blood. The reservoir is round. After it is placed, it appears as a small, raised area under your skin. Catheter. The catheter is a thin, flexible tube that connects the reservoir to a vein. Medicine that is inserted into the reservoir goes into the catheter and then into the vein. How is my port accessed? To access your port: A numbing cream may be placed on the skin over the port site. Your health care provider will put on a mask and sterile gloves. The skin over your port will be cleaned carefully with a germ-killing soap and allowed to dry. Your health care provider will gently pinch the port and insert a needle into it. Your health care provider will check for a blood return to make sure the port is in the vein and is not clogged. If your port needs to remain accessed to get medicine continuously (constant infusion), your health care provider will place a clear bandage (dressing) over the needle site. The dressing and needle will need to be changed every week, or as told by your health care provider. What  is flushing? Flushing helps keep the port from getting clogged. Follow instructions from your health care provider about how and when to flush the port. Ports are usually flushed with saline solution or a medicine called heparin. The need for flushing will depend on how the port is used: If the port is only used from time to time to give medicines or draw blood, the port may need to be flushed: Before and after medicines have been given. Before and after blood has been drawn. As part of routine maintenance. Flushing may be recommended every 4-6 weeks. If a constant infusion is running, the port may not need to be flushed. Throw away any syringes in a disposal container that is meant for sharp items (sharps container). You can buy a sharps container from a pharmacy, or you can make one by using an empty hard plastic bottle with a cover. How long will my port stay implanted? The port can stay in for as long as your health care provider thinks it is needed. When it is time for the port to come out, a surgery will be done to remove it. The surgery will be similar to the procedure that was done to putthe port in. Follow these instructions at home:  Flush your port as told by your health care provider. If you need an infusion over several days, follow instructions from your health care provider about how to take   care of your port site. Make sure you: Wash your hands with soap and water before you change your dressing. If soap and water are not available, use alcohol-based hand sanitizer. Change your dressing as told by your health care provider. Place any used dressings or infusion bags into a plastic bag. Throw that bag in the trash. Keep the dressing that covers the needle clean and dry. Do not get it wet. Do not use scissors or sharp objects near the tube. Keep the tube clamped, unless it is being used. Check your port site every day for signs of infection. Check for: Redness, swelling, or  pain. Fluid or blood. Pus or a bad smell. Protect the skin around the port site. Avoid wearing bra straps that rub or irritate the site. Protect the skin around your port from seat belts. Place a soft pad over your chest if needed. Bathe or shower as told by your health care provider. The site may get wet as long as you are not actively receiving an infusion. Return to your normal activities as told by your health care provider. Ask your health care provider what activities are safe for you. Carry a medical alert card or wear a medical alert bracelet at all times. This will let health care providers know that you have an implanted port in case of an emergency. Get help right away if: You have redness, swelling, or pain at the port site. You have fluid or blood coming from your port site. You have pus or a bad smell coming from the port site. You have a fever. Summary Implanted ports are usually placed in the chest for long-term IV access. Follow instructions from your health care provider about flushing the port and changing bandages (dressings). Take care of the area around your port by avoiding clothing that puts pressure on the area, and by watching for signs of infection. Protect the skin around your port from seat belts. Place a soft pad over your chest if needed. Get help right away if you have a fever or you have redness, swelling, pain, drainage, or a bad smell at the port site. This information is not intended to replace advice given to you by your health care provider. Make sure you discuss any questions you have with your healthcare provider. Document Revised: 03/21/2020 Document Reviewed: 03/21/2020 [employer] Patient Education  2022 [employer]  

## 2021-07-11 NOTE — Progress Notes (Signed)
[facility] OFFICE PROGRESS NOTE   Diagnosis: Colon cancer  INTERVAL HISTORY:   Kathy Robinson returns as scheduled.  She completed another cycle of FOLFIRI/Avastin 06/20/2021.  She overall feels well.  She has a good appetite.  She denies pain.  No bleeding.  She denies nausea/vomiting.  No mouth sores.  Occasional diarrhea.  No leg swelling or calf pain.  Neuropathy symptoms are "easing".  Objective:  Vital signs in last 24 hours:  Blood pressure 130/70, pulse 80, temperature 98.1 F (36.7 C), temperature source Oral, resp. rate 20, height $RemoveBe'5\' 6"'ZkyVbKpJM$  (1.676 m), weight 145 lb 12.8 oz (66.1 kg), last menstrual period 11/19/1996, SpO2 98 %.    HEENT: No thrush or ulcers. Resp: Lungs clear bilaterally. Cardio: Regular rate and rhythm. GI: Abdomen soft and nontender.  No hepatosplenomegaly. Vascular: No leg edema. Skin: Palms without erythema. Port-A-Cath without erythema.   Lab Results:  Lab Results  Component Value Date   WBC 5.8 07/11/2021   HGB 13.4 07/11/2021   HCT 40.7 07/11/2021   MCV 110.0 (H) 07/11/2021   PLT 198 07/11/2021   NEUTROABS 2.9 07/11/2021    Imaging:  No results found.  Medications: I have reviewed the patient's current medications.  Assessment/Plan: Adenocarcinoma the left colon, stage IIIc (pT4b,pN2a), status post a left colectomy 05/30/2020, MSS, TMB 13, BRAF V600E Tumor invades the visceral peritoneum, lymphovascular and perineural invasion present, for tumor deposits, 7/13 lymph nodes, negative resection margins, no loss of mismatch repair protein expression CT abdomen/pelvis 05/26/2020-long segment of masslike thickening of the descending colon with associated high-grade colonic obstruction, small colonic wall defect with evidence of a focally contained microperforation, retroperitoneal adenopathy, indeterminate small hypodense liver lesions Elevated preoperative CEA PET 06/22/2020-hypermetabolic liver metastases, periportal and periaortic  adenopathy, hypermetabolic mediastinal and left supraclavicular nodes Cycle 1 FOLFOX 07/05/20 Cycle 2 FOLFOXIRI (dose-reduced 5FU, no bolus) and Avastin 07/19/20  Cycle 3 FOLFOXIRI (Dose reduced 5-FU, no bolus)/Avastin 08/02/2020 Cycle 4 FOLFOXIRI/Avastin 08/16/2020 (Udenyca added) Cycle 5 FOLFOXIRI/Avastin 08/30/2020 CTs 09/09/2020-diminished size of lymph nodes in the chest, abdomen, and pelvis.  Decreased hepatic metastases.  No new evidence of disease progression, fat density lesion surrounding the left hemicolectomy Cycle 6 FOLFOXIRI/Avastin 09/13/2020 Cycle 7 FOLFOX/Avastin 09/27/2020 (irinotecan held) Cycle 8 FOLFOXIRI/Avastin 10/18/2020  Cycle 9 FOLFOXIRI/Avastin 11/01/2020 Cycle 10 FOLFOXIRI/Avastin 11/22/2020 (oxaliplatin held, irinotecan and 5-FU dose reduced) Cycle 11 FOLFOXIRI/Avastin 12/12/2020 (oxaliplatin held) CTs 12/19/2020-decrease in size of liver metastases, no evidence of disease progression Cycle 12 FOLFOXIRI/Avastin 01/04/2021 (oxaliplatin held) Cycle 13 FOLFOXIRI/Avastin 01/24/2021 (oxaliplatin held) Cycle 14 FOLFOXIRI/Avastin 02/14/2021 (oxaliplatin held) Cycle 15 FOLFOXIRI/Avastin 03/07/2021 (oxaliplatin held) Cycle 16 FOLFOXIRI/Avastin 03/29/2021 (oxaliplatin held) CTs 04/14/2021- decreased size of liver metastases.  No new or progressive findings. 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  There is no clinical evidence of disease progression.  Plan to proceed with cycle 21 FOLFIRI/Avastin today as scheduled.  Restaging CTs prior to next visit.  We reviewed the CBC from today.  Counts adequate to proceed with treatment.  She will return for lab, follow-up, FOLFIRI/Avastin in 3 weeks,  restaging CTs a few days prior.  She will contact the office in the interim with any problems.    Ned Card ANP/GNP-BC   07/11/2021  9:26 AM

## 2021-07-11 NOTE — Patient Instructions (Signed)
[facility]  Discharge Instructions: Thank you for choosing [facility] to provide your oncology and hematology care.   If you have a lab appointment with the [facility], please go directly to the [facility] and check in at the registration area.   Wear comfortable clothing and clothing appropriate for easy access to any Portacath or PICC line.   We strive to give you quality time with your provider. You may need to reschedule your appointment if you arrive late (15 or more minutes).  Arriving late affects you and other patients whose appointments are after yours.  Also, if you miss three or more appointments without notifying the office, you may be dismissed from the clinic at the provider's discretion.      For prescription refill requests, have your pharmacy contact our office and allow 72 hours for refills to be completed.    Today you received the following chemotherapy and/or immunotherapy agents Avastin, Irinotecan, Leucovorin, 5FU      To help prevent nausea and vomiting after your treatment, we encourage you to take your nausea medication as directed.  BELOW ARE SYMPTOMS THAT SHOULD BE REPORTED IMMEDIATELY: *FEVER GREATER THAN 100.4 F (38 C) OR HIGHER *CHILLS OR SWEATING *NAUSEA AND VOMITING THAT IS NOT CONTROLLED WITH YOUR NAUSEA MEDICATION *UNUSUAL SHORTNESS OF BREATH *UNUSUAL BRUISING OR BLEEDING *URINARY PROBLEMS (pain or burning when urinating, or frequent urination) *BOWEL PROBLEMS (unusual diarrhea, constipation, pain near the anus) TENDERNESS IN MOUTH AND THROAT WITH OR WITHOUT PRESENCE OF ULCERS (sore throat, sores in mouth, or a toothache) UNUSUAL RASH, SWELLING OR PAIN  UNUSUAL VAGINAL DISCHARGE OR ITCHING   Items with * indicate a potential emergency and should be followed up as soon as possible or go to the Emergency Department if any problems should occur.  Please show the CHEMOTHERAPY ALERT CARD or IMMUNOTHERAPY  ALERT CARD at check-in to the Emergency Department and triage nurse.  Should you have questions after your visit or need to cancel or reschedule your appointment, please contact [facility]  Dept: [phone]  and follow the prompts.  Office hours are 8:00 a.m. to 4:30 p.m. Monday - Friday. Please note that voicemails left after 4:00 p.m. may not be returned until the following business day.  We are closed weekends and major holidays. You have access to a nurse at all times for urgent questions. Please call the main number to the clinic Dept: [phone] and follow the prompts.   For any non-urgent questions, you may also contact your provider using MyChart. We now offer e-Visits for anyone 35 and older to request care online for non-urgent symptoms. For details visit [email or web address].   Also download the MyChart app! Go to the app store, search "MyChart", open the app, select [facility], and log in with your MyChart username and password.  Due to Covid, a mask is required upon entering the hospital/clinic. If you do not have a mask, one will be given to you upon arrival. For doctor visits, patients may have 1 support person aged 49 or older with them. For treatment visits, patients cannot have anyone with them due to current Covid guidelines and our immunocompromised population.

## 2021-07-13 ENCOUNTER — Inpatient Hospital Stay: Payer: Medicare Other

## 2021-07-13 ENCOUNTER — Other Ambulatory Visit: Payer: Self-pay

## 2021-07-13 VITALS — BP 160/66 | HR 82 | Temp 98.1°F | Resp 18

## 2021-07-13 DIAGNOSIS — T451X5A Adverse effect of antineoplastic and immunosuppressive drugs, initial encounter: Secondary | ICD-10-CM | POA: Diagnosis not present

## 2021-07-13 DIAGNOSIS — Z5189 Encounter for other specified aftercare: Secondary | ICD-10-CM | POA: Diagnosis not present

## 2021-07-13 DIAGNOSIS — Z5111 Encounter for antineoplastic chemotherapy: Secondary | ICD-10-CM | POA: Diagnosis not present

## 2021-07-13 DIAGNOSIS — G620 Drug-induced polyneuropathy: Secondary | ICD-10-CM | POA: Diagnosis not present

## 2021-07-13 DIAGNOSIS — C787 Secondary malignant neoplasm of liver and intrahepatic bile duct: Secondary | ICD-10-CM | POA: Diagnosis not present

## 2021-07-13 DIAGNOSIS — I482 Chronic atrial fibrillation, unspecified: Secondary | ICD-10-CM | POA: Diagnosis not present

## 2021-07-13 DIAGNOSIS — E876 Hypokalemia: Secondary | ICD-10-CM | POA: Diagnosis not present

## 2021-07-13 DIAGNOSIS — C185 Malignant neoplasm of splenic flexure: Secondary | ICD-10-CM

## 2021-07-13 DIAGNOSIS — R197 Diarrhea, unspecified: Secondary | ICD-10-CM | POA: Diagnosis not present

## 2021-07-13 DIAGNOSIS — D709 Neutropenia, unspecified: Secondary | ICD-10-CM | POA: Diagnosis not present

## 2021-07-13 MED ORDER — PEGFILGRASTIM-CBQV 6 MG/0.6ML ~~LOC~~ SOSY
6.0000 mg | PREFILLED_SYRINGE | Freq: Once | SUBCUTANEOUS | Status: AC
  Administered 2021-07-13: 6 mg via SUBCUTANEOUS

## 2021-07-13 MED ORDER — SODIUM CHLORIDE 0.9% FLUSH
10.0000 mL | INTRAVENOUS | Status: DC | PRN
  Administered 2021-07-13: 10 mL

## 2021-07-13 MED ORDER — HEPARIN SOD (PORK) LOCK FLUSH 100 UNIT/ML IV SOLN
500.0000 [IU] | Freq: Once | INTRAVENOUS | Status: AC | PRN
  Administered 2021-07-13: 500 [IU]

## 2021-07-13 NOTE — Patient Instructions (Signed)
[facility]  Discharge Instructions: Thank you for choosing [facility] to provide your oncology and hematology care.   If you have a lab appointment with the Cancer Center, please go directly to the Cancer Center and check in at the registration area.   Wear comfortable clothing and clothing appropriate for easy access to any Portacath or PICC line.   We strive to give you quality time with your provider. You may need to reschedule your appointment if you arrive late (15 or more minutes).  Arriving late affects you and other patients whose appointments are after yours.  Also, if you miss three or more appointments without notifying the office, you may be dismissed from the clinic at the provider's discretion.      For prescription refill requests, have your pharmacy contact our office and allow 72 hours for refills to be completed.    Today you received the following chemotherapy and/or immunotherapy agents UDENCYA      To help prevent nausea and vomiting after your treatment, we encourage you to take your nausea medication as directed.  BELOW ARE SYMPTOMS THAT SHOULD BE REPORTED IMMEDIATELY: *FEVER GREATER THAN 100.4 F (38 C) OR HIGHER *CHILLS OR SWEATING *NAUSEA AND VOMITING THAT IS NOT CONTROLLED WITH YOUR NAUSEA MEDICATION *UNUSUAL SHORTNESS OF BREATH *UNUSUAL BRUISING OR BLEEDING *URINARY PROBLEMS (pain or burning when urinating, or frequent urination) *BOWEL PROBLEMS (unusual diarrhea, constipation, pain near the anus) TENDERNESS IN MOUTH AND THROAT WITH OR WITHOUT PRESENCE OF ULCERS (sore throat, sores in mouth, or a toothache) UNUSUAL RASH, SWELLING OR PAIN  UNUSUAL VAGINAL DISCHARGE OR ITCHING   Items with * indicate a potential emergency and should be followed up as soon as possible or go to the Emergency Department if any problems should occur.  Please show the CHEMOTHERAPY ALERT CARD or IMMUNOTHERAPY ALERT CARD at check-in to the  Emergency Department and triage nurse.  Should you have questions after your visit or need to cancel or reschedule your appointment, please contact [facility]  Dept: [phone]  and follow the prompts.  Office hours are 8:00 a.m. to 4:30 p.m. Monday - Friday. Please note that voicemails left after 4:00 p.m. may not be returned until the following business day.  We are closed weekends and major holidays. You have access to a nurse at all times for urgent questions. Please call the main number to the clinic Dept: [phone] and follow the prompts.   For any non-urgent questions, you may also contact your provider using MyChart. We now offer e-Visits for anyone 18 and older to request care online for non-urgent symptoms. For details visit [email or web address].   Also download the MyChart app! Go to the app store, search "MyChart", open the app, select [facility], and log in with your MyChart username and password.  Due to Covid, a mask is required upon entering the hospital/clinic. If you do not have a mask, one will be given to you upon arrival. For doctor visits, patients may have 1 support person aged 18 or older with them. For treatment visits, patients cannot have anyone with them due to current Covid guidelines and our immunocompromised population.   Pegfilgrastim injection What is this medication? PEGFILGRASTIM (PEG fil gra stim) is a long-acting granulocyte colony-stimulating factor that stimulates the growth of neutrophils, a type of white blood cell important in the body's fight against infection. It is used to reduce the incidence of fever and infection in patients   with certain types of cancer who are receiving chemotherapy that affects the bone marrow, and to increase survival after being exposed to high doses of radiation. This medicine may be used for other purposes; ask your health care provider or pharmacist if you have questions. COMMON BRAND NAME(S):  Fulphila, Neulasta, Nyvepria, UDENYCA, Ziextenzo What should I tell my care team before I take this medication? They need to know if you have any of these conditions: kidney disease latex allergy ongoing radiation therapy sickle cell disease skin reactions to acrylic adhesives (On-Body Injector only) an unusual or allergic reaction to pegfilgrastim, filgrastim, other medicines, foods, dyes, or preservatives pregnant or trying to get pregnant breast-feeding How should I use this medication? This medicine is for injection under the skin. If you get this medicine at home, you will be taught how to prepare and give the pre-filled syringe or how to use the On-body Injector. Refer to the patient Instructions for Use for detailed instructions. Use exactly as directed. Tell your healthcare provider immediately if you suspect that the On-body Injector may not have performed as intended or if you suspect the use of the On-body Injector resulted in a missed or partial dose. It is important that you put your used needles and syringes in a special sharps container. Do not put them in a trash can. If you do not have a sharps container, call your pharmacist or healthcare provider to get one. Talk to your pediatrician regarding the use of this medicine in children. While this drug may be prescribed for selected conditions, precautions do apply. Overdosage: If you think you have taken too much of this medicine contact a poison control center or emergency room at once. NOTE: This medicine is only for you. Do not share this medicine with others. What if I miss a dose? It is important not to miss your dose. Call your doctor or health care professional if you miss your dose. If you miss a dose due to an On-body Injector failure or leakage, a new dose should be administered as soon as possible using a single prefilled syringe for manual use. What may interact with this medication? Interactions have not been  studied. This list may not describe all possible interactions. Give your health care provider a list of all the medicines, herbs, non-prescription drugs, or dietary supplements you use. Also tell them if you smoke, drink alcohol, or use illegal drugs. Some items may interact with your medicine. What should I watch for while using this medication? Your condition will be monitored carefully while you are receiving this medicine. You may need blood work done while you are taking this medicine. Talk to your health care provider about your risk of cancer. You may be more at risk for certain types of cancer if you take this medicine. If you are going to need a MRI, CT scan, or other procedure, tell your doctor that you are using this medicine (On-Body Injector only). What side effects may I notice from receiving this medication? Side effects that you should report to your doctor or health care professional as soon as possible: allergic reactions (skin rash, itching or hives, swelling of the face, lips, or tongue) back pain dizziness fever pain, redness, or irritation at site where injected pinpoint red spots on the skin red or dark-brown urine shortness of breath or breathing problems stomach or side pain, or pain at the shoulder swelling tiredness trouble passing urine or change in the amount of urine unusual bruising or bleeding   Side effects that usually do not require medical attention (report to your doctor or health care professional if they continue or are bothersome): bone pain muscle pain This list may not describe all possible side effects. Call your doctor for medical advice about side effects. You may report side effects to FDA at 1-800-FDA-1088. Where should I keep my medication? Keep out of the reach of children. If you are using this medicine at home, you will be instructed on how to store it. Throw away any unused medicine after the expiration date on the label. NOTE: This sheet  is a summary. It may not cover all possible information. If you have questions about this medicine, talk to your doctor, pharmacist, or health care provider.  2022 Elsevier/Gold Standard (2020-12-02 11:54:14)   

## 2021-07-28 ENCOUNTER — Ambulatory Visit (HOSPITAL_COMMUNITY)
Admission: RE | Admit: 2021-07-28 | Discharge: 2021-07-28 | Disposition: A | Discharge status: Home / Self Care | Payer: Medicare Other | Source: Ambulatory Visit | Attending: Nurse Practitioner | Admitting: Nurse Practitioner

## 2021-07-28 ENCOUNTER — Other Ambulatory Visit: Payer: Self-pay

## 2021-07-28 DIAGNOSIS — C189 Malignant neoplasm of colon, unspecified: Secondary | ICD-10-CM | POA: Diagnosis not present

## 2021-07-28 DIAGNOSIS — C185 Malignant neoplasm of splenic flexure: Secondary | ICD-10-CM | POA: Insufficient documentation

## 2021-07-28 DIAGNOSIS — I96 Gangrene, not elsewhere classified: Secondary | ICD-10-CM | POA: Diagnosis not present

## 2021-07-28 DIAGNOSIS — I2510 Atherosclerotic heart disease of native coronary artery without angina pectoris: Secondary | ICD-10-CM | POA: Diagnosis not present

## 2021-07-28 DIAGNOSIS — K7689 Other specified diseases of liver: Secondary | ICD-10-CM | POA: Diagnosis not present

## 2021-07-28 DIAGNOSIS — I700 Atherosclerosis of aorta: Secondary | ICD-10-CM | POA: Diagnosis not present

## 2021-07-28 MED ORDER — IOHEXOL 350 MG/ML SOLN
80.0000 mL | Freq: Once | INTRAVENOUS | Status: AC | PRN
  Administered 2021-07-28: 80 mL via INTRAVENOUS

## 2021-07-29 ENCOUNTER — Other Ambulatory Visit: Payer: Self-pay | Admitting: Oncology

## 2021-08-01 ENCOUNTER — Inpatient Hospital Stay (HOSPITAL_BASED_OUTPATIENT_CLINIC_OR_DEPARTMENT_OTHER): Payer: Medicare Other | Admitting: Oncology

## 2021-08-01 ENCOUNTER — Inpatient Hospital Stay: Payer: Medicare Other

## 2021-08-01 ENCOUNTER — Inpatient Hospital Stay: Payer: Medicare Other | Attending: Oncology

## 2021-08-01 ENCOUNTER — Other Ambulatory Visit: Payer: Self-pay

## 2021-08-01 VITALS — BP 135/71 | HR 60 | Temp 98.1°F | Resp 20 | Ht 66.0 in | Wt 146.4 lb

## 2021-08-01 DIAGNOSIS — R197 Diarrhea, unspecified: Secondary | ICD-10-CM | POA: Insufficient documentation

## 2021-08-01 DIAGNOSIS — C185 Malignant neoplasm of splenic flexure: Secondary | ICD-10-CM

## 2021-08-01 DIAGNOSIS — Z5111 Encounter for antineoplastic chemotherapy: Secondary | ICD-10-CM | POA: Diagnosis not present

## 2021-08-01 DIAGNOSIS — E876 Hypokalemia: Secondary | ICD-10-CM | POA: Diagnosis not present

## 2021-08-01 DIAGNOSIS — D709 Neutropenia, unspecified: Secondary | ICD-10-CM | POA: Insufficient documentation

## 2021-08-01 DIAGNOSIS — C787 Secondary malignant neoplasm of liver and intrahepatic bile duct: Secondary | ICD-10-CM | POA: Diagnosis not present

## 2021-08-01 DIAGNOSIS — I4891 Unspecified atrial fibrillation: Secondary | ICD-10-CM | POA: Diagnosis not present

## 2021-08-01 DIAGNOSIS — G620 Drug-induced polyneuropathy: Secondary | ICD-10-CM | POA: Diagnosis not present

## 2021-08-01 DIAGNOSIS — C186 Malignant neoplasm of descending colon: Secondary | ICD-10-CM | POA: Diagnosis not present

## 2021-08-01 DIAGNOSIS — R5383 Other fatigue: Secondary | ICD-10-CM | POA: Diagnosis not present

## 2021-08-01 LAB — CMP (CANCER CENTER ONLY)
ALT: 25 U/L (ref 0–44)
AST: 20 U/L (ref 15–41)
Albumin: 3.9 g/dL (ref 3.5–5.0)
Alkaline Phosphatase: 96 U/L (ref 38–126)
Anion gap: 7 (ref 5–15)
BUN: 27 mg/dL — ABNORMAL HIGH (ref 8–23)
CO2: 24 mmol/L (ref 22–32)
Calcium: 9 mg/dL (ref 8.9–10.3)
Chloride: 106 mmol/L (ref 98–111)
Creatinine: 0.83 mg/dL (ref 0.44–1.00)
GFR, Estimated: >60 mL/min (ref >60)
Glucose, Bld: 98 mg/dL (ref 70–99)
Potassium: 4.7 mmol/L (ref 3.5–5.1)
Sodium: 137 mmol/L (ref 135–145)
Total Bilirubin: 0.3 mg/dL (ref 0.3–1.2)
Total Protein: 6.9 g/dL (ref 6.5–8.1)

## 2021-08-01 LAB — CBC WITH DIFFERENTIAL (CANCER CENTER ONLY)
Abs Immature Granulocytes: 0.04 10*3/uL (ref 0.00–0.07)
Basophils Absolute: 0 10*3/uL (ref 0.0–0.1)
Basophils Relative: 0 %
Eosinophils Absolute: 0.2 10*3/uL (ref 0.0–0.5)
Eosinophils Relative: 1 %
HCT: 39.3 % (ref 36.0–46.0)
Hemoglobin: 12.8 g/dL (ref 12.0–15.0)
Immature Granulocytes: 0 %
Lymphocytes Relative: 9 %
Lymphs Abs: 1.1 10*3/uL (ref 0.7–4.0)
MCH: 35.5 pg — ABNORMAL HIGH (ref 26.0–34.0)
MCHC: 32.6 g/dL (ref 30.0–36.0)
MCV: 108.9 fL — ABNORMAL HIGH (ref 80.0–100.0)
Monocytes Absolute: 0.5 10*3/uL (ref 0.1–1.0)
Monocytes Relative: 4 %
Neutro Abs: 11.1 10*3/uL — ABNORMAL HIGH (ref 1.7–7.7)
Neutrophils Relative %: 86 %
Platelet Count: 274 10*3/uL (ref 150–400)
RBC: 3.61 MIL/uL — ABNORMAL LOW (ref 3.87–5.11)
RDW: 13.4 % (ref 11.5–15.5)
WBC Count: 13 10*3/uL — ABNORMAL HIGH (ref 4.0–10.5)
nRBC: 0 % (ref 0.0–0.2)

## 2021-08-01 LAB — TOTAL PROTEIN, URINE DIPSTICK: Protein, ur: NEGATIVE mg/dL

## 2021-08-01 LAB — CEA (ACCESS): CEA (CHCC): 1.46 ng/mL (ref 0.00–5.00)

## 2021-08-01 LAB — MAGNESIUM: Magnesium: 1.9 mg/dL (ref 1.7–2.4)

## 2021-08-01 MED ORDER — SODIUM CHLORIDE 0.9 % IV SOLN: Freq: Once | INTRAVENOUS | Status: AC

## 2021-08-01 MED ORDER — SODIUM CHLORIDE 0.9 % IV SOLN
1600.0000 mg/m2 | INTRAVENOUS | Status: DC
  Administered 2021-08-01: 2650 mg via INTRAVENOUS
  Filled 2021-08-01: qty 53

## 2021-08-01 MED ORDER — SODIUM CHLORIDE 0.9 % IV SOLN
450.0000 mg | Freq: Once | INTRAVENOUS | Status: AC
  Administered 2021-08-01: 450 mg via INTRAVENOUS
  Filled 2021-08-01: qty 16

## 2021-08-01 NOTE — Progress Notes (Signed)
Patient presents for treatment. RN assessment completed along with the following:  Treatment Conditions (labs/vitals/weight) reviewed - Yes, and within treatment parameters.   Oncology Treatment Attestation completed for current therapy- Yes, on date 06/27/2020 Informed consent completed and reflects current therapy/intent - Yes, on date 07/09/2020             Provider progress note reviewed - Today's provider note is not yet available. I reviewed the most recent oncology provider progress note in chart dated 07/11/2021. Treatment/Antibody/Supportive plan reviewed - Yes, and Patient getting only Avastin and 5FU pump today. S&H and other orders reviewed - Yes, and there are no additional orders identified. Previous treatment date reviewed - Yes, and the appropriate amount of time has elapsed between treatments. Clinic Hand Off Received from - Yes from Santiago Glad, LPN  Patient to proceed with treatment.

## 2021-08-01 NOTE — Progress Notes (Signed)
[facility] OFFICE PROGRESS NOTE   Diagnosis: Colon cancer  INTERVAL HISTORY:   Kathy Robinson returns as scheduled.  Complete another cycle of FOLFIRI/Avastin on 07/11/2021.  She has malaise for several days following chemotherapy.  No mouth sores, diarrhea, bleeding, or symptoms of thrombosis.  [place]  Neuropathy symptoms in the hands and feet have improved.  Objective:  Vital signs in last 24 hours:  Blood pressure 135/71, pulse 60, temperature 98.1 F (36.7 C), temperature source Oral, resp. rate 20, height _0  (1.676 m), weight 146 lb 6.4 oz (66.4 kg), last menstrual period 11/19/1996, SpO2 99 %.    HEENT: No thrush or ulcers Resp: Lungs clear bilaterally Cardio: Regular rate and rhythm GI: No hepatosplenomegaly Vascular: No leg edema Skin: Palms without erythema  Portacath/PICC-without erythema  Lab Results:  Lab Results  Component Value Date   WBC 13.0 (H) 08/01/2021   HGB 12.8 08/01/2021   HCT 39.3 08/01/2021   MCV 108.9 (H) 08/01/2021   PLT 274 08/01/2021   NEUTROABS 11.1 (H) 08/01/2021    CMP  Lab Results  Component Value Date   NA 139 07/11/2021   K 4.3 07/11/2021   CL 105 07/11/2021   CO2 24 07/11/2021   GLUCOSE 100 (H) 07/11/2021   BUN 23 07/11/2021   CREATININE 0.83 07/11/2021   CALCIUM 8.9 07/11/2021   PROT 6.5 07/11/2021   ALBUMIN 3.6 07/11/2021   AST 32 07/11/2021   ALT 31 07/11/2021   ALKPHOS 99 07/11/2021   BILITOT 0.3 07/11/2021   GFRNONAA >60 07/11/2021   GFRAA >60 08/16/2020    Lab Results  Component Value Date   CEA1 1.16 03/29/2021   CEA 1.56 07/11/2021      Imaging:  CT CHEST ABDOMEN PELVIS W CONTRAST  Result Date: 07/29/2021 CLINICAL DATA:  Metastatic colon cancer restaging, assess treatment response, status post colon resection EXAM: CT CHEST, ABDOMEN, AND PELVIS WITH CONTRAST TECHNIQUE: Multidetector CT imaging of the chest, abdomen and pelvis was performed following the  standard protocol during bolus administration of intravenous contrast. CONTRAST:  55m OMNIPAQUE IOHEXOL 350 MG/ML SOLN, additional oral enteric contrast COMPARISON:  04/13/2021 FINDINGS: CT CHEST FINDINGS Cardiovascular: Right chest port catheter. Aortic atherosclerosis. Normal heart size. Left coronary artery calcifications. No pericardial effusion. Mediastinum/Nodes: No enlarged mediastinal, hilar, or axillary lymph nodes. Thyroid gland, trachea, and esophagus demonstrate no significant findings. Lungs/Pleura: Lungs are clear. No pleural effusion or pneumothorax. Musculoskeletal: No chest wall mass or suspicious bone lesions identified. CT ABDOMEN PELVIS FINDINGS Hepatobiliary: Multiple small hypodense lesions of the liver are stable, for example a lesion in the anterior right lobe of the liver, hepatic segment VIII measuring 0.6 cm (series 2, image 57), in the posterior right lobe of the liver, hepatic segment VII measuring 0.6 cm (series 2, image 60), and in the high central liver dome, hepatic segment VII, measuring 0.6 cm (series 2, image 50). No gallstones, gallbladder wall thickening, or biliary dilatation. Pancreas: Unremarkable. No pancreatic ductal dilatation or surrounding inflammatory changes. Spleen: Normal in size without significant abnormality. Adrenals/Urinary Tract: Adrenal glands are unremarkable. Kidneys are normal, without renal calculi, solid lesion, or hydronephrosis. Bladder is unremarkable. Stomach/Bowel: Stomach is within normal limits. Appendix appears normal. No evidence of bowel wall thickening, distention, or inflammatory changes. Redemonstrated postoperative findings of left hemicolectomy and reanastomosis. Vascular/Lymphatic: Aortic atherosclerosis. No enlarged abdominal or pelvic lymph nodes. Reproductive: No mass or other abnormality. Other: No abdominal wall hernia or abnormality. No abdominopelvic ascites. Redemonstrated  fat necrosis in the left paracolic gutter with expected  interval evolution (series 2, image 84). Musculoskeletal: No acute or significant osseous findings. IMPRESSION: 1. Redemonstrated postoperative findings of left hemicolectomy and reanastomosis. 2. Multiple unchanged subcentimeter hypodense liver lesions, consistent with treated metastases. 3. No evidence of new metastatic disease in the chest, abdomen, or pelvis. 4. Redemonstrated postoperative fat necrosis in the left paracolic gutter with expected interval evolution. 5. Coronary artery disease. Aortic Atherosclerosis (ICD10-I70.0). Electronically Signed   By: Eddie Candle M.D.   On: 07/29/2021 19:56    Medications: I have reviewed the patient's current medications.   Assessment/Plan: Adenocarcinoma the left colon, stage IIIc (pT4b,pN2a), status post a left colectomy 05/30/2020, MSS, TMB 13, BRAF V600E Tumor invades the visceral peritoneum, lymphovascular and perineural invasion present, for tumor deposits, 7/13 lymph nodes, negative resection margins, no loss of mismatch repair protein expression CT abdomen/pelvis 05/26/2020-long segment of masslike thickening of the descending colon with associated high-grade colonic obstruction, small colonic wall defect with evidence of a focally contained microperforation, retroperitoneal adenopathy, indeterminate small hypodense liver lesions Elevated preoperative CEA PET 06/22/2020-hypermetabolic liver metastases, periportal and periaortic adenopathy, hypermetabolic mediastinal and left supraclavicular nodes Cycle 1 FOLFOX 07/05/20 Cycle 2 FOLFOXIRI (dose-reduced 5FU, no bolus) and Avastin 07/19/20  Cycle 3 FOLFOXIRI (Dose reduced 5-FU, no bolus)/Avastin 08/02/2020 Cycle 4 FOLFOXIRI/Avastin 08/16/2020 (Udenyca added) Cycle 5 FOLFOXIRI/Avastin 08/30/2020 CTs 09/09/2020-diminished size of lymph nodes in the chest, abdomen, and pelvis.  Decreased hepatic metastases.  No new evidence of disease progression, fat density lesion surrounding the left hemicolectomy Cycle 6  FOLFOXIRI/Avastin 09/13/2020 Cycle 7 FOLFOX/Avastin 09/27/2020 (irinotecan held) Cycle 8 FOLFOXIRI/Avastin 10/18/2020  Cycle 9 FOLFOXIRI/Avastin 11/01/2020 Cycle 10 FOLFOXIRI/Avastin 11/22/2020 (oxaliplatin held, irinotecan and 5-FU dose reduced) Cycle 11 FOLFOXIRI/Avastin 12/12/2020 (oxaliplatin held) CTs 12/19/2020-decrease in size of liver metastases, no evidence of disease progression Cycle 12 FOLFOXIRI/Avastin 01/04/2021 (oxaliplatin held) Cycle 13 FOLFOXIRI/Avastin 01/24/2021 (oxaliplatin held) Cycle 14 FOLFOXIRI/Avastin 02/14/2021 (oxaliplatin held) Cycle 15 FOLFOXIRI/Avastin 03/07/2021 (oxaliplatin held) Cycle 16 FOLFOXIRI/Avastin 03/29/2021 (oxaliplatin held) CTs 04/14/2021- decreased size of liver metastases.  No new or progressive findings. Cycle 17 FOLFOXIRI/Avastin 04/19/2021 (oxaliplatin held) Cycle 18 FOLFOXIRI/Avastin 05/09/2021 (oxaliplatin held) Cycle 19 FOLFOXIRI/Avastin 05/30/2021 (oxaliplatin held) Cycle 20 FOLFOXIRI/Avastin 06/20/2021 (oxaliplatin held) Cycle 21 FOLFOXIRI/Avastin 07/11/2021 (oxaliplatin held) CTs 07/28/2021-unchanged subcentimeter liver lesions, no evidence of new metastatic disease Cycle 22 FOLFOXIRI/Avastin 08/01/2021 Atrial fibrillation with rapid ventricular response 05/26/2020 Mild neutropenia following cycle 3 FOLFOXIRI, Udenyca added with cycle 4 Hypokalemia secondary to diarrhea-potassium supplementation starting 09/13/2020 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 Poorman appears stable.  I discussed the restaging CT findings with her.  I reviewed the CT images.  The CTs are consistent with stable disease.  We discussed treatment options including a treatment break, continuing FOLFIRI/Avastin, or switching to a maintenance regimen.  She does not wish to take capecitabine. She has significant malaise following the FOLFIRI.  We decided to  change to infusional 5-FU/Avastin every 3 weeks.  She will complete a cycle today.  Ms. Duba will return for an office visit and treatment in 3 weeks. Kathy Coder, MD  08/01/2021  9:01 AM

## 2021-08-01 NOTE — Patient Instructions (Signed)
Kathy Robinson   Discharge Instructions: Thank you for choosing [facility] to provide your oncology and hematology care.   If you have a lab appointment with the [facility], please go directly to the [facility] and check in at the registration area.   Wear comfortable clothing and clothing appropriate for easy access to any Portacath or PICC line.   We strive to give you quality time with your provider. You may need to reschedule your appointment if you arrive late (15 or more minutes).  Arriving late affects you and other patients whose appointments are after yours.  Also, if you miss three or more appointments without notifying the office, you may be dismissed from the clinic at the provider's discretion.      For prescription refill requests, have your pharmacy contact our office and allow 72 hours for refills to be completed.    Today you received the following chemotherapy and/or immunotherapy agents Bevacizumab-bvzr (ZIRABEV) & Flourouracil (ADRUCIL).      To help prevent nausea and vomiting after your treatment, we encourage you to take your nausea medication as directed.  BELOW ARE SYMPTOMS THAT SHOULD BE REPORTED IMMEDIATELY: *FEVER GREATER THAN 100.4 F (38 C) OR HIGHER *CHILLS OR SWEATING *NAUSEA AND VOMITING THAT IS NOT CONTROLLED WITH YOUR NAUSEA MEDICATION *UNUSUAL SHORTNESS OF BREATH *UNUSUAL BRUISING OR BLEEDING *URINARY PROBLEMS (pain or burning when urinating, or frequent urination) *BOWEL PROBLEMS (unusual diarrhea, constipation, pain near the anus) TENDERNESS IN MOUTH AND THROAT WITH OR WITHOUT PRESENCE OF ULCERS (sore throat, sores in mouth, or a toothache) UNUSUAL RASH, SWELLING OR PAIN  UNUSUAL VAGINAL DISCHARGE OR ITCHING   Items with * indicate a potential emergency and should be followed up as soon as possible or go to the Emergency Department if any problems should occur.  Please show the CHEMOTHERAPY ALERT CARD or  IMMUNOTHERAPY ALERT CARD at check-in to the Emergency Department and triage nurse.  Should you have questions after your visit or need to cancel or reschedule your appointment, please contact [facility]  Dept: [phone]  and follow the prompts.  Office hours are 8:00 a.m. to 4:30 p.m. Monday - Friday. Please note that voicemails left after 4:00 p.m. may not be returned until the following business day.  We are closed weekends and major holidays. You have access to a nurse at all times for urgent questions. Please call the main number to the clinic Dept: [phone] and follow the prompts.   For any non-urgent questions, you may also contact your provider using MyChart. We now offer e-Visits for anyone 76 and older to request care online for non-urgent symptoms. For details visit [email or web address].   Also download the MyChart app! Go to the app store, search "MyChart", open the app, select [facility], and log in with your MyChart username and password.  Due to Covid, a mask is required upon entering the hospital/clinic. If you do not have a mask, one will be given to you upon arrival. For doctor visits, patients may have 1 support person aged 32 or older with them. For treatment visits, patients cannot have anyone with them due to current Covid guidelines and our immunocompromised population.   Bevacizumab injection What is this medication? BEVACIZUMAB (be va SIZ yoo mab) is a monoclonal antibody. It is used to treat many types of cancer. This medicine may be used for other purposes; ask your health care provider or pharmacist if you have questions.  COMMON BRAND NAME(S): Avastin, MVASI, [name] What should I tell my care team before I take this medication? They need to know if you have any of these conditions: diabetes heart disease high blood pressure history of coughing up blood prior anthracycline chemotherapy (e.g., doxorubicin, daunorubicin,  epirubicin) recent or ongoing radiation therapy recent or planning to have surgery stroke an unusual or allergic reaction to bevacizumab, hamster proteins, mouse proteins, other medicines, foods, dyes, or preservatives pregnant or trying to get pregnant breast-feeding How should I use this medication? This medicine is for infusion into a vein. It is given by a health care professional in a hospital or clinic setting. Talk to your pediatrician regarding the use of this medicine in children. Special care may be needed. Overdosage: If you think you have taken too much of this medicine contact a poison control center or emergency room at once. NOTE: This medicine is only for you. Do not share this medicine with others. What if I miss a dose? It is important not to miss your dose. Call your doctor or health care professional if you are unable to keep an appointment. What may interact with this medication? Interactions are not expected. This list may not describe all possible interactions. Give your health care provider a list of all the medicines, herbs, non-prescription drugs, or dietary supplements you use. Also tell them if you smoke, drink alcohol, or use illegal drugs. Some items may interact with your medicine. What should I watch for while using this medication? Your condition will be monitored carefully while you are receiving this medicine. You will need important blood work and urine testing done while you are taking this medicine. This medicine may increase your risk to bruise or bleed. Call your doctor or health care professional if you notice any unusual bleeding. Before having surgery, talk to your health care provider to make sure it is ok. This drug can increase the risk of poor healing of your surgical site or wound. You will need to stop this drug for 28 days before surgery. After surgery, wait at least 28 days before restarting this drug. Make sure the surgical site or wound is  healed enough before restarting this drug. Talk to your health care provider if questions. Do not become pregnant while taking this medicine or for 6 months after stopping it. Women should inform their doctor if they wish to become pregnant or think they might be pregnant. There is a potential for serious side effects to an unborn child. Talk to your health care professional or pharmacist for more information. Do not breast-feed an infant while taking this medicine and for 6 months after the last dose. This medicine has caused ovarian failure in some women. This medicine may interfere with the ability to have a child. You should talk to your doctor or health care professional if you are concerned about your fertility. What side effects may I notice from receiving this medication? Side effects that you should report to your doctor or health care professional as soon as possible: allergic reactions like skin rash, itching or hives, swelling of the face, lips, or tongue chest pain or chest tightness chills coughing up blood high fever seizures severe constipation signs and symptoms of bleeding such as bloody or black, tarry stools; red or dark-brown urine; spitting up blood or brown material that looks like coffee grounds; red spots on the skin; unusual bruising or bleeding from the eye, gums, or nose signs and symptoms of a blood clot  such as breathing problems; chest pain; severe, sudden headache; pain, swelling, warmth in the leg signs and symptoms of a stroke like changes in vision; confusion; trouble speaking or understanding; severe headaches; sudden numbness or weakness of the face, arm or leg; trouble walking; dizziness; loss of balance or coordination stomach pain sweating swelling of legs or ankles vomiting weight gain Side effects that usually do not require medical attention (report to your doctor or health care professional if they continue or are bothersome): back pain changes in  taste decreased appetite dry skin nausea tiredness This list may not describe all possible side effects. Call your doctor for medical advice about side effects. You may report side effects to FDA at 1-800-FDA-1088. Where should I keep my medication? This drug is given in a hospital or clinic and will not be stored at home. NOTE: This sheet is a summary. It may not cover all possible information. If you have questions about this medicine, talk to your doctor, pharmacist, or health care provider.  2022 Elsevier/Gold Standard (2019-09-02 10:50:46)  Fluorouracil, 5-FU injection What is this medication? FLUOROURACIL, 5-FU (flure oh YOOR a sil) is a chemotherapy drug. It slows the growth of cancer cells. This medicine is used to treat many types of cancer like breast cancer, colon or rectal cancer, pancreatic cancer, and stomach cancer. This medicine may be used for other purposes; ask your health care provider or pharmacist if you have questions. COMMON BRAND NAME(S): Adrucil What should I tell my care team before I take this medication? They need to know if you have any of these conditions: blood disorders dihydropyrimidine dehydrogenase (DPD) deficiency infection (especially a virus infection such as chickenpox, cold sores, or herpes) kidney disease liver disease malnourished, poor nutrition recent or ongoing radiation therapy an unusual or allergic reaction to fluorouracil, other chemotherapy, other medicines, foods, dyes, or preservatives pregnant or trying to get pregnant breast-feeding How should I use this medication? This drug is given as an infusion or injection into a vein. It is administered in a hospital or clinic by a specially trained health care professional. Talk to your pediatrician regarding the use of this medicine in children. Special care may be needed. Overdosage: If you think you have taken too much of this medicine contact a poison control center or emergency room  at once. NOTE: This medicine is only for you. Do not share this medicine with others. What if I miss a dose? It is important not to miss your dose. Call your doctor or health care professional if you are unable to keep an appointment. What may interact with this medication? Do not take this medicine with any of the following medications: live virus vaccines This medicine may also interact with the following medications: medicines that treat or prevent blood clots like warfarin, enoxaparin, and dalteparin This list may not describe all possible interactions. Give your health care provider a list of all the medicines, herbs, non-prescription drugs, or dietary supplements you use. Also tell them if you smoke, drink alcohol, or use illegal drugs. Some items may interact with your medicine. What should I watch for while using this medication? Visit your doctor for checks on your progress. This drug may make you feel generally unwell. This is not uncommon, as chemotherapy can affect healthy cells as well as cancer cells. Report any side effects. Continue your course of treatment even though you feel ill unless your doctor tells you to stop. In some cases, you may be given additional medicines to   help with side effects. Follow all directions for their use. Call your doctor or health care professional for advice if you get a fever, chills or sore throat, or other symptoms of a cold or flu. Do not treat yourself. This drug decreases your body's ability to fight infections. Try to avoid being around people who are sick. This medicine may increase your risk to bruise or bleed. Call your doctor or health care professional if you notice any unusual bleeding. Be careful brushing and flossing your teeth or using a toothpick because you may get an infection or bleed more easily. If you have any dental work done, tell your dentist you are receiving this medicine. Avoid taking products that contain aspirin,  acetaminophen, ibuprofen, naproxen, or ketoprofen unless instructed by your doctor. These medicines may hide a fever. Do not become pregnant while taking this medicine. Women should inform their doctor if they wish to become pregnant or think they might be pregnant. There is a potential for serious side effects to an unborn child. Talk to your health care professional or pharmacist for more information. Do not breast-feed an infant while taking this medicine. Men should inform their doctor if they wish to father a child. This medicine may lower sperm counts. Do not treat diarrhea with over the counter products. Contact your doctor if you have diarrhea that lasts more than 2 days or if it is severe and watery. This medicine can make you more sensitive to the sun. Keep out of the sun. If you cannot avoid being in the sun, wear protective clothing and use sunscreen. Do not use sun lamps or tanning beds/booths. What side effects may I notice from receiving this medication? Side effects that you should report to your doctor or health care professional as soon as possible: allergic reactions like skin rash, itching or hives, swelling of the face, lips, or tongue low blood counts - this medicine may decrease the number of white blood cells, red blood cells and platelets. You may be at increased risk for infections and bleeding. signs of infection - fever or chills, cough, sore throat, pain or difficulty passing urine signs of decreased platelets or bleeding - bruising, pinpoint red spots on the skin, black, tarry stools, blood in the urine signs of decreased red blood cells - unusually weak or tired, fainting spells, lightheadedness breathing problems changes in vision chest pain mouth sores nausea and vomiting pain, swelling, redness at site where injected pain, tingling, numbness in the hands or feet redness, swelling, or sores on hands or feet stomach pain unusual bleeding Side effects that usually  do not require medical attention (report to your doctor or health care professional if they continue or are bothersome): changes in finger or toe nails diarrhea dry or itchy skin hair loss headache loss of appetite sensitivity of eyes to the light stomach upset unusually teary eyes This list may not describe all possible side effects. Call your doctor for medical advice about side effects. You may report side effects to FDA at 1-800-FDA-1088. Where should I keep my medication? This drug is given in a hospital or clinic and will not be stored at home. NOTE: This sheet is a summary. It may not cover all possible information. If you have questions about this medicine, talk to your doctor, pharmacist, or health care provider.  2022 Elsevier/Gold Standard (2019-10-06 15:00:03)  The chemotherapy medication bag should finish at 46 hours, 96 hours, or 7 days. For example, if your pump is scheduled for   46 hours and it was put on at 4:00 p.m., it should finish at 2:00 p.m. the day it is scheduled to come off regardless of your appointment time.     Estimated time to finish at 11:00 a.m. on Thursday 08/03/2021.   If the display on your pump reads "Low Volume" and it is beeping, take the batteries out of the pump and come to the cancer center for it to be taken off.   If the pump alarms go off prior to the pump reading "Low Volume" then call 1-800-315-3287 and someone can assist you.  If the plunger comes out and the chemotherapy medication is leaking out, please use your home chemo spill kit to clean up the spill. Do NOT use paper towels or other household products.  If you have problems or questions regarding your pump, please call either 1-800-315-3287 (24 hours a day) or the cancer center Monday-Friday 8:00 a.m.- 4:30 p.m. at the clinic number and we will assist you. If you are unable to get assistance, then go to the nearest Emergency Department and ask the staff to contact the IV team for  assistance.   

## 2021-08-03 ENCOUNTER — Inpatient Hospital Stay: Payer: Medicare Other

## 2021-08-03 ENCOUNTER — Other Ambulatory Visit (HOSPITAL_COMMUNITY): Payer: Self-pay

## 2021-08-03 ENCOUNTER — Other Ambulatory Visit: Payer: Self-pay

## 2021-08-03 DIAGNOSIS — I4891 Unspecified atrial fibrillation: Secondary | ICD-10-CM | POA: Diagnosis not present

## 2021-08-03 DIAGNOSIS — C787 Secondary malignant neoplasm of liver and intrahepatic bile duct: Secondary | ICD-10-CM | POA: Diagnosis not present

## 2021-08-03 DIAGNOSIS — G620 Drug-induced polyneuropathy: Secondary | ICD-10-CM | POA: Diagnosis not present

## 2021-08-03 DIAGNOSIS — Z5111 Encounter for antineoplastic chemotherapy: Secondary | ICD-10-CM | POA: Diagnosis not present

## 2021-08-03 DIAGNOSIS — Z95828 Presence of other vascular implants and grafts: Secondary | ICD-10-CM

## 2021-08-03 DIAGNOSIS — R5383 Other fatigue: Secondary | ICD-10-CM | POA: Diagnosis not present

## 2021-08-03 DIAGNOSIS — R197 Diarrhea, unspecified: Secondary | ICD-10-CM | POA: Diagnosis not present

## 2021-08-03 DIAGNOSIS — C185 Malignant neoplasm of splenic flexure: Secondary | ICD-10-CM | POA: Diagnosis not present

## 2021-08-03 DIAGNOSIS — E876 Hypokalemia: Secondary | ICD-10-CM | POA: Diagnosis not present

## 2021-08-03 DIAGNOSIS — D709 Neutropenia, unspecified: Secondary | ICD-10-CM | POA: Diagnosis not present

## 2021-08-03 MED ORDER — HEPARIN SOD (PORK) LOCK FLUSH 100 UNIT/ML IV SOLN
500.0000 [IU] | Freq: Once | INTRAVENOUS | Status: AC
  Administered 2021-08-03: 500 [IU]

## 2021-08-03 MED ORDER — SODIUM CHLORIDE 0.9% FLUSH
10.0000 mL | Freq: Once | INTRAVENOUS | Status: AC
Start: 2021-08-03 — End: 2021-08-03
  Administered 2021-08-03: 10 mL

## 2021-08-03 NOTE — Patient Instructions (Signed)
Kathy Robinson  Discharge Instructions: Thank you for choosing [facility] to provide your oncology and hematology care.   If you have a lab appointment with the [facility], please go directly to the [facility] and check in at the registration area.   Wear comfortable clothing and clothing appropriate for easy access to any Portacath or PICC line.   We strive to give you quality time with your provider. You may need to reschedule your appointment if you arrive late (15 or more minutes).  Arriving late affects you and other patients whose appointments are after yours.  Also, if you miss three or more appointments without notifying the office, you may be dismissed from the clinic at the provider's discretion.      For prescription refill requests, have your pharmacy contact our office and allow 72 hours for refills to be completed.    Today you received the following chemotherapy and/or immunotherapy agents PUMP STOP      To help prevent nausea and vomiting after your treatment, we encourage you to take your nausea medication as directed.  BELOW ARE SYMPTOMS THAT SHOULD BE REPORTED IMMEDIATELY: *FEVER GREATER THAN 100.4 F (38 C) OR HIGHER *CHILLS OR SWEATING *NAUSEA AND VOMITING THAT IS NOT CONTROLLED WITH YOUR NAUSEA MEDICATION *UNUSUAL SHORTNESS OF BREATH *UNUSUAL BRUISING OR BLEEDING *URINARY PROBLEMS (pain or burning when urinating, or frequent urination) *BOWEL PROBLEMS (unusual diarrhea, constipation, pain near the anus) TENDERNESS IN MOUTH AND THROAT WITH OR WITHOUT PRESENCE OF ULCERS (sore throat, sores in mouth, or a toothache) UNUSUAL RASH, SWELLING OR PAIN  UNUSUAL VAGINAL DISCHARGE OR ITCHING   Items with * indicate a potential emergency and should be followed up as soon as possible or go to the Emergency Department if any problems should occur.  Please show the CHEMOTHERAPY ALERT CARD or IMMUNOTHERAPY ALERT CARD at check-in to the  Emergency Department and triage nurse.  Should you have questions after your visit or need to cancel or reschedule your appointment, please contact [facility]  Dept: [phone]  and follow the prompts.  Office hours are 8:00 a.m. to 4:30 p.m. Monday - Friday. Please note that voicemails left after 4:00 p.m. may not be returned until the following business day.  We are closed weekends and major holidays. You have access to a nurse at all times for urgent questions. Please call the main number to the clinic Dept: [phone] and follow the prompts.   For any non-urgent questions, you may also contact your provider using MyChart. We now offer e-Visits for anyone 17 and older to request care online for non-urgent symptoms. For details visit [email or web address].   Also download the MyChart app! Go to the app store, search "MyChart", open the app, select [facility], and log in with your MyChart username and password.  Due to Covid, a mask is required upon entering the hospital/clinic. If you do not have a mask, one will be given to you upon arrival. For doctor visits, patients may have 1 support person aged 19 or older with them. For treatment visits, patients cannot have anyone with them due to current Covid guidelines and our immunocompromised population.

## 2021-08-20 ENCOUNTER — Other Ambulatory Visit: Payer: Self-pay | Admitting: Oncology

## 2021-08-22 ENCOUNTER — Inpatient Hospital Stay: Payer: Medicare Other

## 2021-08-22 ENCOUNTER — Inpatient Hospital Stay: Payer: Medicare Other | Attending: Oncology

## 2021-08-22 ENCOUNTER — Inpatient Hospital Stay: Payer: Medicare Other | Admitting: Oncology

## 2021-08-22 ENCOUNTER — Other Ambulatory Visit: Payer: Self-pay

## 2021-08-22 VITALS — BP 150/63 | HR 81 | Temp 98.1°F | Resp 20 | Ht 66.0 in | Wt 149.2 lb

## 2021-08-22 DIAGNOSIS — C182 Malignant neoplasm of ascending colon: Secondary | ICD-10-CM | POA: Diagnosis not present

## 2021-08-22 DIAGNOSIS — C185 Malignant neoplasm of splenic flexure: Secondary | ICD-10-CM

## 2021-08-22 DIAGNOSIS — E876 Hypokalemia: Secondary | ICD-10-CM | POA: Diagnosis not present

## 2021-08-22 DIAGNOSIS — C787 Secondary malignant neoplasm of liver and intrahepatic bile duct: Secondary | ICD-10-CM | POA: Insufficient documentation

## 2021-08-22 DIAGNOSIS — I4891 Unspecified atrial fibrillation: Secondary | ICD-10-CM | POA: Insufficient documentation

## 2021-08-22 DIAGNOSIS — Z5111 Encounter for antineoplastic chemotherapy: Secondary | ICD-10-CM | POA: Insufficient documentation

## 2021-08-22 DIAGNOSIS — D709 Neutropenia, unspecified: Secondary | ICD-10-CM | POA: Insufficient documentation

## 2021-08-22 DIAGNOSIS — G620 Drug-induced polyneuropathy: Secondary | ICD-10-CM | POA: Insufficient documentation

## 2021-08-22 LAB — CBC WITH DIFFERENTIAL (CANCER CENTER ONLY)
Abs Immature Granulocytes: 0 10*3/uL (ref 0.00–0.07)
Basophils Absolute: 0 10*3/uL (ref 0.0–0.1)
Basophils Relative: 1 %
Eosinophils Absolute: 0.3 10*3/uL (ref 0.0–0.5)
Eosinophils Relative: 6 %
HCT: 39.3 % (ref 36.0–46.0)
Hemoglobin: 13 g/dL (ref 12.0–15.0)
Immature Granulocytes: 0 %
Lymphocytes Relative: 30 %
Lymphs Abs: 1.5 10*3/uL (ref 0.7–4.0)
MCH: 36 pg — ABNORMAL HIGH (ref 26.0–34.0)
MCHC: 33.1 g/dL (ref 30.0–36.0)
MCV: 108.9 fL — ABNORMAL HIGH (ref 80.0–100.0)
Monocytes Absolute: 0.5 10*3/uL (ref 0.1–1.0)
Monocytes Relative: 10 %
Neutro Abs: 2.7 10*3/uL (ref 1.7–7.7)
Neutrophils Relative %: 53 %
Platelet Count: 178 10*3/uL (ref 150–400)
RBC: 3.61 MIL/uL — ABNORMAL LOW (ref 3.87–5.11)
RDW: 13.2 % (ref 11.5–15.5)
WBC Count: 5 10*3/uL (ref 4.0–10.5)
nRBC: 0 % (ref 0.0–0.2)

## 2021-08-22 LAB — CMP (CANCER CENTER ONLY)
ALT: 27 U/L (ref 0–44)
AST: 32 U/L (ref 15–41)
Albumin: 3.9 g/dL (ref 3.5–5.0)
Alkaline Phosphatase: 89 U/L (ref 38–126)
Anion gap: 6 (ref 5–15)
BUN: 22 mg/dL (ref 8–23)
CO2: 26 mmol/L (ref 22–32)
Calcium: 9.1 mg/dL (ref 8.9–10.3)
Chloride: 106 mmol/L (ref 98–111)
Creatinine: 0.77 mg/dL (ref 0.44–1.00)
GFR, Estimated: >60 mL/min (ref >60)
Glucose, Bld: 81 mg/dL (ref 70–99)
Potassium: 4.5 mmol/L (ref 3.5–5.1)
Sodium: 138 mmol/L (ref 135–145)
Total Bilirubin: 0.4 mg/dL (ref 0.3–1.2)
Total Protein: 6.8 g/dL (ref 6.5–8.1)

## 2021-08-22 LAB — MAGNESIUM: Magnesium: 2.3 mg/dL (ref 1.7–2.4)

## 2021-08-22 LAB — TOTAL PROTEIN, URINE DIPSTICK: Protein, ur: NEGATIVE mg/dL

## 2021-08-22 LAB — CEA (ACCESS): CEA (CHCC): 1.81 ng/mL (ref 0.00–5.00)

## 2021-08-22 MED ORDER — SODIUM CHLORIDE 0.9% FLUSH: 10.0000 mL | INTRAVENOUS | Status: DC | PRN

## 2021-08-22 MED ORDER — SODIUM CHLORIDE 0.9 % IV SOLN
1600.0000 mg/m2 | INTRAVENOUS | Status: DC
  Administered 2021-08-22: 2650 mg via INTRAVENOUS
  Filled 2021-08-22: qty 53

## 2021-08-22 MED ORDER — SODIUM CHLORIDE 0.9 % IV SOLN
450.0000 mg | Freq: Once | INTRAVENOUS | Status: AC
  Administered 2021-08-22: 450 mg via INTRAVENOUS
  Filled 2021-08-22: qty 4

## 2021-08-22 MED ORDER — SODIUM CHLORIDE 0.9 % IV SOLN: Freq: Once | INTRAVENOUS | Status: AC

## 2021-08-22 NOTE — Progress Notes (Signed)
[facility] OFFICE PROGRESS NOTE   Diagnosis: Colon cancer  INTERVAL HISTORY:   Kathy Robinson completed a cycle of 5-FU/Avastin on 08/01/2021.  No nausea, mouth sores, diarrhea, bleeding, or symptom of thrombosis.  She developed swelling and erythema surrounding the right eye last week.  This has improved with warm compresses.  Objective:  Vital signs in last 24 hours:  Blood pressure (!) 150/63, pulse 81, temperature 98.1 F (36.7 C), temperature source Oral, resp. rate 20, height _0  (1.676 m), weight 149 lb 3.2 oz (67.7 kg), last menstrual period 11/19/1996, SpO2 100 %.    HEENT: Mild edema of the right upper eyelid with induration medially, no conjunctival erythema, no discharge.  No thrush or ulcers Resp: Lungs clear bilaterally Cardio: Regular rate and rhythm GI: No hepatosplenomegaly Vascular: No leg edema  Skin: Palms without erythema  Portacath/PICC-without erythema  Lab Results:  Lab Results  Component Value Date   WBC 5.0 08/22/2021   HGB 13.0 08/22/2021   HCT 39.3 08/22/2021   MCV 108.9 (H) 08/22/2021   PLT 178 08/22/2021   NEUTROABS 2.7 08/22/2021    CMP  Lab Results  Component Value Date   NA 138 08/22/2021   K 4.5 08/22/2021   CL 106 08/22/2021   CO2 26 08/22/2021   GLUCOSE 81 08/22/2021   BUN 22 08/22/2021   CREATININE 0.77 08/22/2021   CALCIUM 9.1 08/22/2021   PROT 6.8 08/22/2021   ALBUMIN 3.9 08/22/2021   AST 32 08/22/2021   ALT 27 08/22/2021   ALKPHOS 89 08/22/2021   BILITOT 0.4 08/22/2021   GFRNONAA >60 08/22/2021   GFRAA >60 08/16/2020    Lab Results  Component Value Date   CEA1 1.16 03/29/2021   CEA 1.46 08/01/2021    Medications: I have reviewed the patient's current medications.   Assessment/Plan: Adenocarcinoma the left colon, stage IIIc (pT4b,pN2a), status post a left colectomy 05/30/2020, MSS, TMB 13, BRAF V600E Tumor invades the visceral peritoneum, lymphovascular and perineural invasion present, for  tumor deposits, 7/13 lymph nodes, negative resection margins, no loss of mismatch repair protein expression CT abdomen/pelvis 05/26/2020-long segment of masslike thickening of the descending colon with associated high-grade colonic obstruction, small colonic wall defect with evidence of a focally contained microperforation, retroperitoneal adenopathy, indeterminate small hypodense liver lesions Elevated preoperative CEA PET 06/22/2020-hypermetabolic liver metastases, periportal and periaortic adenopathy, hypermetabolic mediastinal and left supraclavicular nodes Cycle 1 FOLFOX 07/05/20 Cycle 2 FOLFOXIRI (dose-reduced 5FU, no bolus) and Avastin 07/19/20  Cycle 3 FOLFOXIRI (Dose reduced 5-FU, no bolus)/Avastin 08/02/2020 Cycle 4 FOLFOXIRI/Avastin 08/16/2020 (Udenyca added) Cycle 5 FOLFOXIRI/Avastin 08/30/2020 CTs 09/09/2020-diminished size of lymph nodes in the chest, abdomen, and pelvis.  Decreased hepatic metastases.  No new evidence of disease progression, fat density lesion surrounding the left hemicolectomy Cycle 6 FOLFOXIRI/Avastin 09/13/2020 Cycle 7 FOLFOX/Avastin 09/27/2020 (irinotecan held) Cycle 8 FOLFOXIRI/Avastin 10/18/2020  Cycle 9 FOLFOXIRI/Avastin 11/01/2020 Cycle 10 FOLFOXIRI/Avastin 11/22/2020 (oxaliplatin held, irinotecan and 5-FU dose reduced) Cycle 11 FOLFOXIRI/Avastin 12/12/2020 (oxaliplatin held) CTs 12/19/2020-decrease in size of liver metastases, no evidence of disease progression Cycle 12 FOLFOXIRI/Avastin 01/04/2021 (oxaliplatin held) Cycle 13 FOLFOXIRI/Avastin 01/24/2021 (oxaliplatin held) Cycle 14 FOLFOXIRI/Avastin 02/14/2021 (oxaliplatin held) Cycle 15 FOLFOXIRI/Avastin 03/07/2021 (oxaliplatin held) Cycle 16 FOLFOXIRI/Avastin 03/29/2021 (oxaliplatin held) CTs 04/14/2021- decreased size of liver metastases.  No new or progressive findings. Cycle 17 FOLFOXIRI/Avastin 04/19/2021 (oxaliplatin held) Cycle 18 FOLFOXIRI/Avastin 05/09/2021 (oxaliplatin held) Cycle 19 FOLFOXIRI/Avastin 05/30/2021  (oxaliplatin held) Cycle 20 FOLFOXIRI/Avastin 06/20/2021 (oxaliplatin held) Cycle 21 FOLFOXIRI/Avastin 07/11/2021 (oxaliplatin held) CTs 07/28/2021-unchanged subcentimeter  liver lesions, no evidence of new metastatic disease Cycle 22 5-FU/ Avastin 08/01/2021 Cycle 23 5-FU/Avastin 08/22/2021  Atrial fibrillation with rapid ventricular response 05/26/2020 Mild neutropenia following cycle 3 FOLFOXIRI, Udenyca added with cycle 4 Hypokalemia secondary to diarrhea-potassium supplementation starting 09/13/2020 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  She tolerated the 5-FU/Avastin well.  She will continue 5-FU/Avastin on a 3-week schedule.  She appears to have a stye at the right eye.  She will continue warm compresses.  Kathy Robinson will return for an office visit and chemotherapy in 3 weeks.  [name], MD  08/22/2021  9:13 AM

## 2021-08-22 NOTE — Patient Instructions (Signed)
Implanted Port Home Guide An implanted port is a device that is placed under the skin. It is usually placed in the chest. The device can be used to give IV medicine, to take blood, or for dialysis. You may have an implanted port if: You need IV medicine that would be irritating to the small veins in your hands or arms. You need IV medicines, such as antibiotics, for a long period of time. You need IV nutrition for a long period of time. You need dialysis. When you have a port, your health care provider can choose to use the port instead of veins in your arms for these procedures. You may have fewer limitations when using a port than you would if you used other types of long-term IVs, and you will likely be able to return to normal activities after your incision heals. An implanted port has two main parts: Reservoir. The reservoir is the part where a needle is inserted to give medicines or draw blood. The reservoir is round. After it is placed, it appears as a small, raised area under your skin. Catheter. The catheter is a thin, flexible tube that connects the reservoir to a vein. Medicine that is inserted into the reservoir goes into the catheter and then into the vein. How is my port accessed? To access your port: A numbing cream may be placed on the skin over the port site. Your health care provider will put on a mask and sterile gloves. The skin over your port will be cleaned carefully with a germ-killing soap and allowed to dry. Your health care provider will gently pinch the port and insert a needle into it. Your health care provider will check for a blood return to make sure the port is in the vein and is not clogged. If your port needs to remain accessed to get medicine continuously (constant infusion), your health care provider will place a clear bandage (dressing) over the needle site. The dressing and needle will need to be changed every week, or as told by your health care provider. What  is flushing? Flushing helps keep the port from getting clogged. Follow instructions from your health care provider about how and when to flush the port. Ports are usually flushed with saline solution or a medicine called heparin. The need for flushing will depend on how the port is used: If the port is only used from time to time to give medicines or draw blood, the port may need to be flushed: Before and after medicines have been given. Before and after blood has been drawn. As part of routine maintenance. Flushing may be recommended every 4-6 weeks. If a constant infusion is running, the port may not need to be flushed. Throw away any syringes in a disposal container that is meant for sharp items (sharps container). You can buy a sharps container from a pharmacy, or you can make one by using an empty hard plastic bottle with a cover. How long will my port stay implanted? The port can stay in for as long as your health care provider thinks it is needed. When it is time for the port to come out, a surgery will be done to remove it. The surgery will be similar to the procedure that was done to put the port in. Follow these instructions at home:  Flush your port as told by your health care provider. If you need an infusion over several days, follow instructions from your health care provider about how   to take care of your port site. Make sure you: Wash your hands with soap and water before you change your dressing. If soap and water are not available, use alcohol-based hand sanitizer. Change your dressing as told by your health care provider. Place any used dressings or infusion bags into a plastic bag. Throw that bag in the trash. Keep the dressing that covers the needle clean and dry. Do not get it wet. Do not use scissors or sharp objects near the tube. Keep the tube clamped, unless it is being used. Check your port site every day for signs of infection. Check for: Redness, swelling, or  pain. Fluid or blood. Pus or a bad smell. Protect the skin around the port site. Avoid wearing bra straps that rub or irritate the site. Protect the skin around your port from seat belts. Place a soft pad over your chest if needed. Bathe or shower as told by your health care provider. The site may get wet as long as you are not actively receiving an infusion. Return to your normal activities as told by your health care provider. Ask your health care provider what activities are safe for you. Carry a medical alert card or wear a medical alert bracelet at all times. This will let health care providers know that you have an implanted port in case of an emergency. Get help right away if: You have redness, swelling, or pain at the port site. You have fluid or blood coming from your port site. You have pus or a bad smell coming from the port site. You have a fever. Summary Implanted ports are usually placed in the chest for long-term IV access. Follow instructions from your health care provider about flushing the port and changing bandages (dressings). Take care of the area around your port by avoiding clothing that puts pressure on the area, and by watching for signs of infection. Protect the skin around your port from seat belts. Place a soft pad over your chest if needed. Get help right away if you have a fever or you have redness, swelling, pain, drainage, or a bad smell at the port site. This information is not intended to replace advice given to you by your health care provider. Make sure you discuss any questions you have with your health care provider. Document Revised: 01/25/2021 Document Reviewed: 03/21/2020 Elsevier Patient Education  2022 Elsevier Inc.  

## 2021-08-22 NOTE — Progress Notes (Signed)
Ok to keep same doses today per MD

## 2021-08-22 NOTE — Progress Notes (Signed)
Patient presents for treatment. RN assessment completed along with the following:  Labs/vitals reviewed - Yes, and within treatment parameters.   Weight within 10% of previous measurement - Yes Oncology Treatment Attestation completed for current therapy- Yes, on date 06/27/20 Informed consent completed and reflects current therapy/intent - Yes, on date 07/09/20             Provider progress note reviewed - Yes, today's provider note was reviewed. Treatment/Antibody/Supportive plan reviewed - Yes, and there are no adjustments needed for today's treatment. S&H and other orders reviewed - Yes, and there are no additional orders identified. Previous treatment date reviewed - Yes, and the appropriate amount of time has elapsed between treatments.   Patient to proceed with treatment.

## 2021-08-22 NOTE — Patient Instructions (Signed)
Kathy Robinson   Discharge Instructions: Thank you for choosing [facility] to provide your oncology and hematology care.   If you have a lab appointment with the [facility], please go directly to the [facility] and check in at the registration area.   Wear comfortable clothing and clothing appropriate for easy access to any Portacath or PICC line.   We strive to give you quality time with your provider. You may need to reschedule your appointment if you arrive late (15 or more minutes).  Arriving late affects you and other patients whose appointments are after yours.  Also, if you miss three or more appointments without notifying the office, you may be dismissed from the clinic at the provider's discretion.      For prescription refill requests, have your pharmacy contact our office and allow 72 hours for refills to be completed.    Today you received the following chemotherapy and/or immunotherapy agents Bevacizumab-bvzr (ZIRABEV) & Flourouracil (ADRUCIL).      To help prevent nausea and vomiting after your treatment, we encourage you to take your nausea medication as directed.  BELOW ARE SYMPTOMS THAT SHOULD BE REPORTED IMMEDIATELY: *FEVER GREATER THAN 100.4 F (38 C) OR HIGHER *CHILLS OR SWEATING *NAUSEA AND VOMITING THAT IS NOT CONTROLLED WITH YOUR NAUSEA MEDICATION *UNUSUAL SHORTNESS OF BREATH *UNUSUAL BRUISING OR BLEEDING *URINARY PROBLEMS (pain or burning when urinating, or frequent urination) *BOWEL PROBLEMS (unusual diarrhea, constipation, pain near the anus) TENDERNESS IN MOUTH AND THROAT WITH OR WITHOUT PRESENCE OF ULCERS (sore throat, sores in mouth, or a toothache) UNUSUAL RASH, SWELLING OR PAIN  UNUSUAL VAGINAL DISCHARGE OR ITCHING   Items with * indicate a potential emergency and should be followed up as soon as possible or go to the Emergency Department if any problems should occur.  Please show the CHEMOTHERAPY ALERT CARD or  IMMUNOTHERAPY ALERT CARD at check-in to the Emergency Department and triage nurse.  Should you have questions after your visit or need to cancel or reschedule your appointment, please contact [facility]  Dept: [phone]  and follow the prompts.  Office hours are 8:00 a.m. to 4:30 p.m. Monday - Friday. Please note that voicemails left after 4:00 p.m. may not be returned until the following business day.  We are closed weekends and major holidays. You have access to a nurse at all times for urgent questions. Please call the main number to the clinic Dept: [phone] and follow the prompts.   For any non-urgent questions, you may also contact your provider using MyChart. We now offer e-Visits for anyone 76 and older to request care online for non-urgent symptoms. For details visit [email or web address].   Also download the MyChart app! Go to the app store, search "MyChart", open the app, select [facility], and log in with your MyChart username and password.  Due to Covid, a mask is required upon entering the hospital/clinic. If you do not have a mask, one will be given to you upon arrival. For doctor visits, patients may have 1 support person aged 32 or older with them. For treatment visits, patients cannot have anyone with them due to current Covid guidelines and our immunocompromised population.   Bevacizumab injection What is this medication? BEVACIZUMAB (be va SIZ yoo mab) is a monoclonal antibody. It is used to treat many types of cancer. This medicine may be used for other purposes; ask your health care provider or pharmacist if you have questions.  COMMON BRAND NAME(S): Avastin, MVASI, Kathy Robinson What should I tell my care team before I take this medication? They need to know if you have any of these conditions: diabetes heart disease high blood pressure history of coughing up blood prior anthracycline chemotherapy (e.g., doxorubicin, daunorubicin,  epirubicin) recent or ongoing radiation therapy recent or planning to have surgery stroke an unusual or allergic reaction to bevacizumab, hamster proteins, mouse proteins, other medicines, foods, dyes, or preservatives pregnant or trying to get pregnant breast-feeding How should I use this medication? This medicine is for infusion into a vein. It is given by a health care professional in a hospital or clinic setting. Talk to your pediatrician regarding the use of this medicine in children. Special care may be needed. Overdosage: If you think you have taken too much of this medicine contact a poison control center or emergency room at once. NOTE: This medicine is only for you. Do not share this medicine with others. What if I miss a dose? It is important not to miss your dose. Call your doctor or health care professional if you are unable to keep an appointment. What may interact with this medication? Interactions are not expected. This list may not describe all possible interactions. Give your health care provider a list of all the medicines, herbs, non-prescription drugs, or dietary supplements you use. Also tell them if you smoke, drink alcohol, or use illegal drugs. Some items may interact with your medicine. What should I watch for while using this medication? Your condition will be monitored carefully while you are receiving this medicine. You will need important blood work and urine testing done while you are taking this medicine. This medicine may increase your risk to bruise or bleed. Call your doctor or health care professional if you notice any unusual bleeding. Before having surgery, talk to your health care provider to make sure it is ok. This drug can increase the risk of poor healing of your surgical site or wound. You will need to stop this drug for 28 days before surgery. After surgery, wait at least 28 days before restarting this drug. Make sure the surgical site or wound is  healed enough before restarting this drug. Talk to your health care provider if questions. Do not become pregnant while taking this medicine or for 6 months after stopping it. Women should inform their doctor if they wish to become pregnant or think they might be pregnant. There is a potential for serious side effects to an unborn child. Talk to your health care professional or pharmacist for more information. Do not breast-feed an infant while taking this medicine and for 6 months after the last dose. This medicine has caused ovarian failure in some women. This medicine may interfere with the ability to have a child. You should talk to your doctor or health care professional if you are concerned about your fertility. What side effects may I notice from receiving this medication? Side effects that you should report to your doctor or health care professional as soon as possible: allergic reactions like skin rash, itching or hives, swelling of the face, lips, or tongue chest pain or chest tightness chills coughing up blood high fever seizures severe constipation signs and symptoms of bleeding such as bloody or black, tarry stools; red or dark-brown urine; spitting up blood or brown material that looks like coffee grounds; red spots on the skin; unusual bruising or bleeding from the eye, gums, or nose signs and symptoms of a blood clot  such as breathing problems; chest pain; severe, sudden headache; pain, swelling, warmth in the leg signs and symptoms of a stroke like changes in vision; confusion; trouble speaking or understanding; severe headaches; sudden numbness or weakness of the face, arm or leg; trouble walking; dizziness; loss of balance or coordination stomach pain sweating swelling of legs or ankles vomiting weight gain Side effects that usually do not require medical attention (report to your doctor or health care professional if they continue or are bothersome): back pain changes in  taste decreased appetite dry skin nausea tiredness This list may not describe all possible side effects. Call your doctor for medical advice about side effects. You may report side effects to FDA at 1-800-FDA-1088. Where should I keep my medication? This drug is given in a hospital or clinic and will not be stored at home. NOTE: This sheet is a summary. It may not cover all possible information. If you have questions about this medicine, talk to your doctor, pharmacist, or health care provider.  2022 Elsevier/Gold Standard (2019-09-02 10:50:46)  Fluorouracil, 5-FU injection What is this medication? FLUOROURACIL, 5-FU (flure oh YOOR a sil) is a chemotherapy drug. It slows the growth of cancer cells. This medicine is used to treat many types of cancer like breast cancer, colon or rectal cancer, pancreatic cancer, and stomach cancer. This medicine may be used for other purposes; ask your health care provider or pharmacist if you have questions. COMMON BRAND NAME(S): Adrucil What should I tell my care team before I take this medication? They need to know if you have any of these conditions: blood disorders dihydropyrimidine dehydrogenase (DPD) deficiency infection (especially a virus infection such as chickenpox, cold sores, or herpes) kidney disease liver disease malnourished, poor nutrition recent or ongoing radiation therapy an unusual or allergic reaction to fluorouracil, other chemotherapy, other medicines, foods, dyes, or preservatives pregnant or trying to get pregnant breast-feeding How should I use this medication? This drug is given as an infusion or injection into a vein. It is administered in a hospital or clinic by a specially trained health care professional. Talk to your pediatrician regarding the use of this medicine in children. Special care may be needed. Overdosage: If you think you have taken too much of this medicine contact a poison control center or emergency room  at once. NOTE: This medicine is only for you. Do not share this medicine with others. What if I miss a dose? It is important not to miss your dose. Call your doctor or health care professional if you are unable to keep an appointment. What may interact with this medication? Do not take this medicine with any of the following medications: live virus vaccines This medicine may also interact with the following medications: medicines that treat or prevent blood clots like warfarin, enoxaparin, and dalteparin This list may not describe all possible interactions. Give your health care provider a list of all the medicines, herbs, non-prescription drugs, or dietary supplements you use. Also tell them if you smoke, drink alcohol, or use illegal drugs. Some items may interact with your medicine. What should I watch for while using this medication? Visit your doctor for checks on your progress. This drug may make you feel generally unwell. This is not uncommon, as chemotherapy can affect healthy cells as well as cancer cells. Report any side effects. Continue your course of treatment even though you feel ill unless your doctor tells you to stop. In some cases, you may be given additional medicines to  help with side effects. Follow all directions for their use. Call your doctor or health care professional for advice if you get a fever, chills or sore throat, or other symptoms of a cold or flu. Do not treat yourself. This drug decreases your body's ability to fight infections. Try to avoid being around people who are sick. This medicine may increase your risk to bruise or bleed. Call your doctor or health care professional if you notice any unusual bleeding. Be careful brushing and flossing your teeth or using a toothpick because you may get an infection or bleed more easily. If you have any dental work done, tell your dentist you are receiving this medicine. Avoid taking products that contain aspirin,  acetaminophen, ibuprofen, naproxen, or ketoprofen unless instructed by your doctor. These medicines may hide a fever. Do not become pregnant while taking this medicine. Women should inform their doctor if they wish to become pregnant or think they might be pregnant. There is a potential for serious side effects to an unborn child. Talk to your health care professional or pharmacist for more information. Do not breast-feed an infant while taking this medicine. Men should inform their doctor if they wish to father a child. This medicine may lower sperm counts. Do not treat diarrhea with over the counter products. Contact your doctor if you have diarrhea that lasts more than 2 days or if it is severe and watery. This medicine can make you more sensitive to the sun. Keep out of the sun. If you cannot avoid being in the sun, wear protective clothing and use sunscreen. Do not use sun lamps or tanning beds/booths. What side effects may I notice from receiving this medication? Side effects that you should report to your doctor or health care professional as soon as possible: allergic reactions like skin rash, itching or hives, swelling of the face, lips, or tongue low blood counts - this medicine may decrease the number of white blood cells, red blood cells and platelets. You may be at increased risk for infections and bleeding. signs of infection - fever or chills, cough, sore throat, pain or difficulty passing urine signs of decreased platelets or bleeding - bruising, pinpoint red spots on the skin, black, tarry stools, blood in the urine signs of decreased red blood cells - unusually weak or tired, fainting spells, lightheadedness breathing problems changes in vision chest pain mouth sores nausea and vomiting pain, swelling, redness at site where injected pain, tingling, numbness in the hands or feet redness, swelling, or sores on hands or feet stomach pain unusual bleeding Side effects that usually  do not require medical attention (report to your doctor or health care professional if they continue or are bothersome): changes in finger or toe nails diarrhea dry or itchy skin hair loss headache loss of appetite sensitivity of eyes to the light stomach upset unusually teary eyes This list may not describe all possible side effects. Call your doctor for medical advice about side effects. You may report side effects to FDA at 1-800-FDA-1088. Where should I keep my medication? This drug is given in a hospital or clinic and will not be stored at home. NOTE: This sheet is a summary. It may not cover all possible information. If you have questions about this medicine, talk to your doctor, pharmacist, or health care provider.  2022 Elsevier/Gold Standard (2019-10-06 15:00:03)  The chemotherapy medication bag should finish at 46 hours, 96 hours, or 7 days. For example, if your pump is scheduled for  46 hours and it was put on at 4:00 p.m., it should finish at 2:00 p.m. the day it is scheduled to come off regardless of your appointment time.     Estimated time to finish at 11:00 a.m. on Thursday 08/03/2021.   If the display on your pump reads "Low Volume" and it is beeping, take the batteries out of the pump and come to the cancer center for it to be taken off.   If the pump alarms go off prior to the pump reading "Low Volume" then call [phone] and someone can assist you.  If the plunger comes out and the chemotherapy medication is leaking out, please use your home chemo spill kit to clean up the spill. Do NOT use paper towels or other household products.  If you have problems or questions regarding your pump, please call either [phone] (24 hours a day) or the cancer center Monday-Friday 8:00 a.m.- 4:30 p.m. at the clinic number and we will assist you. If you are unable to get assistance, then go to the nearest Emergency Department and ask the staff to contact the IV team for  assistance.

## 2021-08-23 ENCOUNTER — Other Ambulatory Visit: Payer: Self-pay | Admitting: Oncology

## 2021-08-23 ENCOUNTER — Inpatient Hospital Stay: Payer: Medicare Other

## 2021-08-23 ENCOUNTER — Telehealth: Payer: Self-pay

## 2021-08-23 VITALS — BP 132/75 | HR 70 | Temp 98.7°F | Resp 18

## 2021-08-23 DIAGNOSIS — D709 Neutropenia, unspecified: Secondary | ICD-10-CM | POA: Diagnosis not present

## 2021-08-23 DIAGNOSIS — E876 Hypokalemia: Secondary | ICD-10-CM | POA: Diagnosis not present

## 2021-08-23 DIAGNOSIS — C182 Malignant neoplasm of ascending colon: Secondary | ICD-10-CM | POA: Diagnosis not present

## 2021-08-23 DIAGNOSIS — Z5111 Encounter for antineoplastic chemotherapy: Secondary | ICD-10-CM | POA: Diagnosis not present

## 2021-08-23 DIAGNOSIS — C787 Secondary malignant neoplasm of liver and intrahepatic bile duct: Secondary | ICD-10-CM | POA: Diagnosis not present

## 2021-08-23 DIAGNOSIS — I4891 Unspecified atrial fibrillation: Secondary | ICD-10-CM | POA: Diagnosis not present

## 2021-08-23 DIAGNOSIS — G620 Drug-induced polyneuropathy: Secondary | ICD-10-CM | POA: Diagnosis not present

## 2021-08-23 DIAGNOSIS — C185 Malignant neoplasm of splenic flexure: Secondary | ICD-10-CM

## 2021-08-23 MED ORDER — SODIUM CHLORIDE 0.9 % IV SOLN
800.0000 mg/m2 | Freq: Once | INTRAVENOUS | Status: DC
  Administered 2021-08-23: 1300 mg via INTRAVENOUS
  Filled 2021-08-23: qty 26

## 2021-08-23 NOTE — Telephone Encounter (Signed)
TC from Pt stating she dropped the bag with the pump and now its leaking. Return call to Pt to come in the office.Pt was already on her way Pt arrived to office in infusion. Dr. Benay Spice notified. Per Dr Benay Spice Pt will receive 24 hour dose of 5 FU to finish out her treatment.

## 2021-08-23 NOTE — Patient Instructions (Signed)
Kathy Robinson  Discharge Instructions: Thank you for choosing [facility] to provide your oncology and hematology care.   If you have a lab appointment with the [facility], please go directly to the [facility] and check in at the registration area.   Wear comfortable clothing and clothing appropriate for easy access to any Portacath or PICC line.   We strive to give you quality time with your provider. You may need to reschedule your appointment if you arrive late (15 or more minutes).  Arriving late affects you and other patients whose appointments are after yours.  Also, if you miss three or more appointments without notifying the office, you may be dismissed from the clinic at the provider's discretion.      For prescription refill requests, have your pharmacy contact our office and allow 72 hours for refills to be completed.    Today you received the following chemotherapy and/or immunotherapy agents Flourouracil (ADRUCIL) Pump.      To help prevent nausea and vomiting after your treatment, we encourage you to take your nausea medication as directed.  BELOW ARE SYMPTOMS THAT SHOULD BE REPORTED IMMEDIATELY: *FEVER GREATER THAN 100.4 F (38 C) OR HIGHER *CHILLS OR SWEATING *NAUSEA AND VOMITING THAT IS NOT CONTROLLED WITH YOUR NAUSEA MEDICATION *UNUSUAL SHORTNESS OF BREATH *UNUSUAL BRUISING OR BLEEDING *URINARY PROBLEMS (pain or burning when urinating, or frequent urination) *BOWEL PROBLEMS (unusual diarrhea, constipation, pain near the anus) TENDERNESS IN MOUTH AND THROAT WITH OR WITHOUT PRESENCE OF ULCERS (sore throat, sores in mouth, or a toothache) UNUSUAL RASH, SWELLING OR PAIN  UNUSUAL VAGINAL DISCHARGE OR ITCHING   Items with * indicate a potential emergency and should be followed up as soon as possible or go to the Emergency Department if any problems should occur.  Please show the CHEMOTHERAPY ALERT CARD or IMMUNOTHERAPY ALERT CARD  at check-in to the Emergency Department and triage nurse.  Should you have questions after your visit or need to cancel or reschedule your appointment, please contact [facility]  Dept: [phone]  and follow the prompts.  Office hours are 8:00 a.m. to 4:30 p.m. Monday - Friday. Please note that voicemails left after 4:00 p.m. may not be returned until the following business day.  We are closed weekends and major holidays. You have access to a nurse at all times for urgent questions. Please call the main number to the clinic Dept: [phone] and follow the prompts.   For any non-urgent questions, you may also contact your provider using MyChart. We now offer e-Visits for anyone 5 and older to request care online for non-urgent symptoms. For details visit [email or web address].   Also download the MyChart app! Go to the app store, search "MyChart", open the app, select [facility], and log in with your MyChart username and password.  Due to Covid, a mask is required upon entering the hospital/clinic. If you do not have a mask, one will be given to you upon arrival. For doctor visits, patients may have 1 support person aged 3 or older with them. For treatment visits, patients cannot have anyone with them due to current Covid guidelines and our immunocompromised population.   Fluorouracil, 5-FU injection What is this medication? FLUOROURACIL, 5-FU (flure oh YOOR a sil) is a chemotherapy drug. It slows the growth of cancer cells. This medicine is used to treat many types of cancer like breast cancer, colon or rectal cancer, pancreatic cancer, and stomach cancer.  This medicine may be used for other purposes; ask your health care provider or pharmacist if you have questions. COMMON BRAND NAME(S): Adrucil What should I tell my care team before I take this medication? They need to know if you have any of these conditions: blood disorders dihydropyrimidine dehydrogenase  (DPD) deficiency infection (especially a virus infection such as chickenpox, cold sores, or herpes) kidney disease liver disease malnourished, poor nutrition recent or ongoing radiation therapy an unusual or allergic reaction to fluorouracil, other chemotherapy, other medicines, foods, dyes, or preservatives pregnant or trying to get pregnant breast-feeding How should I use this medication? This drug is given as an infusion or injection into a vein. It is administered in a hospital or clinic by a specially trained health care professional. Talk to your pediatrician regarding the use of this medicine in children. Special care may be needed. Overdosage: If you think you have taken too much of this medicine contact a poison control center or emergency room at once. NOTE: This medicine is only for you. Do not share this medicine with others. What if I miss a dose? It is important not to miss your dose. Call your doctor or health care professional if you are unable to keep an appointment. What may interact with this medication? Do not take this medicine with any of the following medications: live virus vaccines This medicine may also interact with the following medications: medicines that treat or prevent blood clots like warfarin, enoxaparin, and dalteparin This list may not describe all possible interactions. Give your health care provider a list of all the medicines, herbs, non-prescription drugs, or dietary supplements you use. Also tell them if you smoke, drink alcohol, or use illegal drugs. Some items may interact with your medicine. What should I watch for while using this medication? Visit your doctor for checks on your progress. This drug may make you feel generally unwell. This is not uncommon, as chemotherapy can affect healthy cells as well as cancer cells. Report any side effects. Continue your course of treatment even though you feel ill unless your doctor tells you to stop. In some  cases, you may be given additional medicines to help with side effects. Follow all directions for their use. Call your doctor or health care professional for advice if you get a fever, chills or sore throat, or other symptoms of a cold or flu. Do not treat yourself. This drug decreases your body's ability to fight infections. Try to avoid being around people who are sick. This medicine may increase your risk to bruise or bleed. Call your doctor or health care professional if you notice any unusual bleeding. Be careful brushing and flossing your teeth or using a toothpick because you may get an infection or bleed more easily. If you have any dental work done, tell your dentist you are receiving this medicine. Avoid taking products that contain aspirin, acetaminophen, ibuprofen, naproxen, or ketoprofen unless instructed by your doctor. These medicines may hide a fever. Do not become pregnant while taking this medicine. Women should inform their doctor if they wish to become pregnant or think they might be pregnant. There is a potential for serious side effects to an unborn child. Talk to your health care professional or pharmacist for more information. Do not breast-feed an infant while taking this medicine. Men should inform their doctor if they wish to father a child. This medicine may lower sperm counts. Do not treat diarrhea with over the counter products. Contact your doctor if  you have diarrhea that lasts more than 2 days or if it is severe and watery. This medicine can make you more sensitive to the sun. Keep out of the sun. If you cannot avoid being in the sun, wear protective clothing and use sunscreen. Do not use sun lamps or tanning beds/booths. What side effects may I notice from receiving this medication? Side effects that you should report to your doctor or health care professional as soon as possible: allergic reactions like skin rash, itching or hives, swelling of the face, lips, or  tongue low blood counts - this medicine may decrease the number of white blood cells, red blood cells and platelets. You may be at increased risk for infections and bleeding. signs of infection - fever or chills, cough, sore throat, pain or difficulty passing urine signs of decreased platelets or bleeding - bruising, pinpoint red spots on the skin, black, tarry stools, blood in the urine signs of decreased red blood cells - unusually weak or tired, fainting spells, lightheadedness breathing problems changes in vision chest pain mouth sores nausea and vomiting pain, swelling, redness at site where injected pain, tingling, numbness in the hands or feet redness, swelling, or sores on hands or feet stomach pain unusual bleeding Side effects that usually do not require medical attention (report to your doctor or health care professional if they continue or are bothersome): changes in finger or toe nails diarrhea dry or itchy skin hair loss headache loss of appetite sensitivity of eyes to the light stomach upset unusually teary eyes This list may not describe all possible side effects. Call your doctor for medical advice about side effects. You may report side effects to FDA at 1-800-FDA-1088. Where should I keep my medication? This drug is given in a hospital or clinic and will not be stored at home. NOTE: This sheet is a summary. It may not cover all possible information. If you have questions about this medicine, talk to your doctor, pharmacist, or health care provider.  2022 [employer]/Gold Standard (2019-10-06 15:00:03)  The chemotherapy medication bag should finish at 24 hours, 46 hours, 96 hours, or 7 days. For example, if your pump is scheduled for 46 hours and it was put on at 4:00 p.m., it should finish at 2:00 p.m. the day it is scheduled to come off regardless of your appointment time.     Estimated time to finish at 12:30 p.m. on Thursday 08/24/2020.   If the display on your  pump reads "Low Volume" and it is beeping, take the batteries out of the pump and come to the cancer center for it to be taken off.   If the pump alarms go off prior to the pump reading "Low Volume" then call [phone] and someone can assist you.  If the plunger comes out and the chemotherapy medication is leaking out, please use your home chemo spill kit to clean up the spill. Do NOT use paper towels or other household products.  If you have problems or questions regarding your pump, please call either [phone] (24 hours a day) or the cancer center Monday-Friday 8:00 a.m.- 4:30 p.m. at the clinic number and we will assist you. If you are unable to get assistance, then go to the nearest Emergency Department and ask the staff to contact the IV team for assistance.

## 2021-08-23 NOTE — Progress Notes (Signed)
Patient presents today with her 5FU pump disconnected. She stated that she accidentally dropped the bag around 5:30 p.m. yesterday 08/22/2021 and the medication came loose and started leaking. She cleaned up as best as she could and packed it up. This nurse noted bag to be completely empty. Physician notified with request on how to proceed with making up for patient's lost medication. Patient was due for pump stop tomorrow 08/24/2021 at 1030.

## 2021-08-24 ENCOUNTER — Other Ambulatory Visit: Payer: Self-pay

## 2021-08-24 ENCOUNTER — Inpatient Hospital Stay: Payer: Medicare Other

## 2021-08-24 VITALS — BP 123/76 | HR 78 | Temp 98.0°F | Resp 18

## 2021-08-24 DIAGNOSIS — C182 Malignant neoplasm of ascending colon: Secondary | ICD-10-CM | POA: Diagnosis not present

## 2021-08-24 DIAGNOSIS — C787 Secondary malignant neoplasm of liver and intrahepatic bile duct: Secondary | ICD-10-CM | POA: Diagnosis not present

## 2021-08-24 DIAGNOSIS — D709 Neutropenia, unspecified: Secondary | ICD-10-CM | POA: Diagnosis not present

## 2021-08-24 DIAGNOSIS — E876 Hypokalemia: Secondary | ICD-10-CM | POA: Diagnosis not present

## 2021-08-24 DIAGNOSIS — G620 Drug-induced polyneuropathy: Secondary | ICD-10-CM | POA: Diagnosis not present

## 2021-08-24 DIAGNOSIS — I4891 Unspecified atrial fibrillation: Secondary | ICD-10-CM | POA: Diagnosis not present

## 2021-08-24 DIAGNOSIS — Z5111 Encounter for antineoplastic chemotherapy: Secondary | ICD-10-CM | POA: Diagnosis not present

## 2021-08-24 DIAGNOSIS — C185 Malignant neoplasm of splenic flexure: Secondary | ICD-10-CM

## 2021-08-24 MED ORDER — HEPARIN SOD (PORK) LOCK FLUSH 100 UNIT/ML IV SOLN
500.0000 [IU] | Freq: Once | INTRAVENOUS | Status: AC | PRN
  Administered 2021-08-24: 500 [IU]

## 2021-08-24 MED ORDER — SODIUM CHLORIDE 0.9% FLUSH
10.0000 mL | INTRAVENOUS | Status: DC | PRN
Start: 2021-08-24 — End: 2021-08-24
  Administered 2021-08-24: 10 mL

## 2021-08-24 NOTE — Patient Instructions (Signed)
Implanted Port Home Guide An implanted port is a device that is placed under the skin. It is usually placed in the chest. The device can be used to give IV medicine, to take blood, or for dialysis. You may have an implanted port if: You need IV medicine that would be irritating to the small veins in your hands or arms. You need IV medicines, such as antibiotics, for a long period of time. You need IV nutrition for a long period of time. You need dialysis. When you have a port, your health care provider can choose to use the port instead of veins in your arms for these procedures. You may have fewer limitations when using a port than you would if you used other types of long-term IVs, and you will likely be able to return to normal activities after your incision heals. An implanted port has two main parts: Reservoir. The reservoir is the part where a needle is inserted to give medicines or draw blood. The reservoir is round. After it is placed, it appears as a small, raised area under your skin. Catheter. The catheter is a thin, flexible tube that connects the reservoir to a vein. Medicine that is inserted into the reservoir goes into the catheter and then into the vein. How is my port accessed? To access your port: A numbing cream may be placed on the skin over the port site. Your health care provider will put on a mask and sterile gloves. The skin over your port will be cleaned carefully with a germ-killing soap and allowed to dry. Your health care provider will gently pinch the port and insert a needle into it. Your health care provider will check for a blood return to make sure the port is in the vein and is not clogged. If your port needs to remain accessed to get medicine continuously (constant infusion), your health care provider will place a clear bandage (dressing) over the needle site. The dressing and needle will need to be changed every week, or as told by your health care provider. What  is flushing? Flushing helps keep the port from getting clogged. Follow instructions from your health care provider about how and when to flush the port. Ports are usually flushed with saline solution or a medicine called heparin. The need for flushing will depend on how the port is used: If the port is only used from time to time to give medicines or draw blood, the port may need to be flushed: Before and after medicines have been given. Before and after blood has been drawn. As part of routine maintenance. Flushing may be recommended every 4-6 weeks. If a constant infusion is running, the port may not need to be flushed. Throw away any syringes in a disposal container that is meant for sharp items (sharps container). You can buy a sharps container from a pharmacy, or you can make one by using an empty hard plastic bottle with a cover. How long will my port stay implanted? The port can stay in for as long as your health care provider thinks it is needed. When it is time for the port to come out, a surgery will be done to remove it. The surgery will be similar to the procedure that was done to put the port in. Follow these instructions at home:  Flush your port as told by your health care provider. If you need an infusion over several days, follow instructions from your health care provider about how   to take care of your port site. Make sure you: Wash your hands with soap and water before you change your dressing. If soap and water are not available, use alcohol-based hand sanitizer. Change your dressing as told by your health care provider. Place any used dressings or infusion bags into a plastic bag. Throw that bag in the trash. Keep the dressing that covers the needle clean and dry. Do not get it wet. Do not use scissors or sharp objects near the tube. Keep the tube clamped, unless it is being used. Check your port site every day for signs of infection. Check for: Redness, swelling, or  pain. Fluid or blood. Pus or a bad smell. Protect the skin around the port site. Avoid wearing bra straps that rub or irritate the site. Protect the skin around your port from seat belts. Place a soft pad over your chest if needed. Bathe or shower as told by your health care provider. The site may get wet as long as you are not actively receiving an infusion. Return to your normal activities as told by your health care provider. Ask your health care provider what activities are safe for you. Carry a medical alert card or wear a medical alert bracelet at all times. This will let health care providers know that you have an implanted port in case of an emergency. Get help right away if: You have redness, swelling, or pain at the port site. You have fluid or blood coming from your port site. You have pus or a bad smell coming from the port site. You have a fever. Summary Implanted ports are usually placed in the chest for long-term IV access. Follow instructions from your health care provider about flushing the port and changing bandages (dressings). Take care of the area around your port by avoiding clothing that puts pressure on the area, and by watching for signs of infection. Protect the skin around your port from seat belts. Place a soft pad over your chest if needed. Get help right away if you have a fever or you have redness, swelling, pain, drainage, or a bad smell at the port site. This information is not intended to replace advice given to you by your health care provider. Make sure you discuss any questions you have with your health care provider. Document Revised: 01/25/2021 Document Reviewed: 03/21/2020 Elsevier Patient Education  2022 Elsevier Inc.  

## 2021-08-25 ENCOUNTER — Other Ambulatory Visit (HOSPITAL_COMMUNITY): Payer: Self-pay

## 2021-09-10 ENCOUNTER — Other Ambulatory Visit: Payer: Self-pay | Admitting: Oncology

## 2021-09-11 ENCOUNTER — Other Ambulatory Visit (HOSPITAL_COMMUNITY): Payer: Self-pay

## 2021-09-12 ENCOUNTER — Inpatient Hospital Stay: Payer: Medicare Other

## 2021-09-12 ENCOUNTER — Encounter: Payer: Self-pay | Admitting: Nurse Practitioner

## 2021-09-12 ENCOUNTER — Inpatient Hospital Stay: Payer: Medicare Other | Admitting: Nurse Practitioner

## 2021-09-12 ENCOUNTER — Other Ambulatory Visit (HOSPITAL_COMMUNITY): Payer: Self-pay

## 2021-09-12 ENCOUNTER — Other Ambulatory Visit: Payer: Self-pay

## 2021-09-12 VITALS — BP 139/81 | HR 84 | Temp 98.7°F | Resp 20 | Ht 66.0 in | Wt 148.6 lb

## 2021-09-12 DIAGNOSIS — C182 Malignant neoplasm of ascending colon: Secondary | ICD-10-CM | POA: Diagnosis not present

## 2021-09-12 DIAGNOSIS — C185 Malignant neoplasm of splenic flexure: Secondary | ICD-10-CM

## 2021-09-12 DIAGNOSIS — D709 Neutropenia, unspecified: Secondary | ICD-10-CM | POA: Diagnosis not present

## 2021-09-12 DIAGNOSIS — C787 Secondary malignant neoplasm of liver and intrahepatic bile duct: Secondary | ICD-10-CM | POA: Diagnosis not present

## 2021-09-12 DIAGNOSIS — G620 Drug-induced polyneuropathy: Secondary | ICD-10-CM | POA: Diagnosis not present

## 2021-09-12 DIAGNOSIS — E876 Hypokalemia: Secondary | ICD-10-CM | POA: Diagnosis not present

## 2021-09-12 DIAGNOSIS — Z5111 Encounter for antineoplastic chemotherapy: Secondary | ICD-10-CM | POA: Diagnosis not present

## 2021-09-12 DIAGNOSIS — I4891 Unspecified atrial fibrillation: Secondary | ICD-10-CM | POA: Diagnosis not present

## 2021-09-12 LAB — CMP (CANCER CENTER ONLY)
ALT: 17 U/L (ref 0–44)
AST: 21 U/L (ref 15–41)
Albumin: 3.9 g/dL (ref 3.5–5.0)
Alkaline Phosphatase: 76 U/L (ref 38–126)
Anion gap: 6 (ref 5–15)
BUN: 19 mg/dL (ref 8–23)
CO2: 26 mmol/L (ref 22–32)
Calcium: 8.9 mg/dL (ref 8.9–10.3)
Chloride: 104 mmol/L (ref 98–111)
Creatinine: 0.75 mg/dL (ref 0.44–1.00)
GFR, Estimated: >60 mL/min (ref >60)
Glucose, Bld: 83 mg/dL (ref 70–99)
Potassium: 4.3 mmol/L (ref 3.5–5.1)
Sodium: 136 mmol/L (ref 135–145)
Total Bilirubin: 0.5 mg/dL (ref 0.3–1.2)
Total Protein: 6.7 g/dL (ref 6.5–8.1)

## 2021-09-12 LAB — CBC WITH DIFFERENTIAL (CANCER CENTER ONLY)
Abs Immature Granulocytes: 0 10*3/uL (ref 0.00–0.07)
Basophils Absolute: 0 10*3/uL (ref 0.0–0.1)
Basophils Relative: 0 %
Eosinophils Absolute: 0.2 10*3/uL (ref 0.0–0.5)
Eosinophils Relative: 3 %
HCT: 41.6 % (ref 36.0–46.0)
Hemoglobin: 13.9 g/dL (ref 12.0–15.0)
Immature Granulocytes: 0 %
Lymphocytes Relative: 33 %
Lymphs Abs: 1.7 10*3/uL (ref 0.7–4.0)
MCH: 35.6 pg — ABNORMAL HIGH (ref 26.0–34.0)
MCHC: 33.4 g/dL (ref 30.0–36.0)
MCV: 106.7 fL — ABNORMAL HIGH (ref 80.0–100.0)
Monocytes Absolute: 0.4 10*3/uL (ref 0.1–1.0)
Monocytes Relative: 9 %
Neutro Abs: 2.7 10*3/uL (ref 1.7–7.7)
Neutrophils Relative %: 55 %
Platelet Count: 185 10*3/uL (ref 150–400)
RBC: 3.9 MIL/uL (ref 3.87–5.11)
RDW: 12.7 % (ref 11.5–15.5)
WBC Count: 5 10*3/uL (ref 4.0–10.5)
nRBC: 0 % (ref 0.0–0.2)

## 2021-09-12 LAB — TOTAL PROTEIN, URINE DIPSTICK: Protein, ur: NEGATIVE mg/dL

## 2021-09-12 LAB — MAGNESIUM: Magnesium: 1.9 mg/dL (ref 1.7–2.4)

## 2021-09-12 LAB — CEA (ACCESS): CEA (CHCC): 3.08 ng/mL (ref 0.00–5.00)

## 2021-09-12 MED ORDER — SODIUM CHLORIDE 0.9 % IV SOLN
1600.0000 mg/m2 | INTRAVENOUS | Status: DC
  Administered 2021-09-12: 2650 mg via INTRAVENOUS
  Filled 2021-09-12: qty 53

## 2021-09-12 MED ORDER — SODIUM CHLORIDE 0.9% FLUSH: 10.0000 mL | INTRAVENOUS | Status: DC | PRN

## 2021-09-12 MED ORDER — PRAVASTATIN SODIUM 20 MG PO TABS
20.0000 mg | ORAL_TABLET | Freq: Every day | ORAL | 1 refills | Status: DC
  Filled 2021-09-12: qty 30, 30d supply, fill #0

## 2021-09-12 MED ORDER — HEPARIN SOD (PORK) LOCK FLUSH 100 UNIT/ML IV SOLN: 500.0000 [IU] | Freq: Once | INTRAVENOUS | Status: DC | PRN

## 2021-09-12 MED ORDER — SODIUM CHLORIDE 0.9 % IV SOLN: Freq: Once | INTRAVENOUS | Status: AC

## 2021-09-12 MED ORDER — SODIUM CHLORIDE 0.9 % IV SOLN
7.5000 mg/kg | Freq: Once | INTRAVENOUS | Status: AC
  Administered 2021-09-12: 500 mg via INTRAVENOUS
  Filled 2021-09-12: qty 16

## 2021-09-12 NOTE — Patient Instructions (Addendum)
Kathy Robinson   Discharge Instructions: Thank you for choosing [facility] to provide your oncology and hematology care.   If you have a lab appointment with the [facility], please go directly to the [facility] and check in at the registration area.   Wear comfortable clothing and clothing appropriate for easy access to any Portacath or PICC line.   We strive to give you quality time with your provider. You may need to reschedule your appointment if you arrive late (15 or more minutes).  Arriving late affects you and other patients whose appointments are after yours.  Also, if you miss three or more appointments without notifying the office, you may be dismissed from the clinic at the provider's discretion.      For prescription refill requests, have your pharmacy contact our office and allow 72 hours for refills to be completed.    Today you received the following chemotherapy and/or immunotherapy agents Bevacizumab-bvzr (ZIRABEV) & Flourouracil (ADRUCIL).      To help prevent nausea and vomiting after your treatment, we encourage you to take your nausea medication as directed.  BELOW ARE SYMPTOMS THAT SHOULD BE REPORTED IMMEDIATELY: *FEVER GREATER THAN 100.4 F (38 C) OR HIGHER *CHILLS OR SWEATING *NAUSEA AND VOMITING THAT IS NOT CONTROLLED WITH YOUR NAUSEA MEDICATION *UNUSUAL SHORTNESS OF BREATH *UNUSUAL BRUISING OR BLEEDING *URINARY PROBLEMS (pain or burning when urinating, or frequent urination) *BOWEL PROBLEMS (unusual diarrhea, constipation, pain near the anus) TENDERNESS IN MOUTH AND THROAT WITH OR WITHOUT PRESENCE OF ULCERS (sore throat, sores in mouth, or a toothache) UNUSUAL RASH, SWELLING OR PAIN  UNUSUAL VAGINAL DISCHARGE OR ITCHING   Items with * indicate a potential emergency and should be followed up as soon as possible or go to the Emergency Department if any problems should occur.  Please show the CHEMOTHERAPY ALERT CARD or  IMMUNOTHERAPY ALERT CARD at check-in to the Emergency Department and triage nurse.  Should you have questions after your visit or need to cancel or reschedule your appointment, please contact [facility]  Dept: [phone]  and follow the prompts.  Office hours are 8:00 a.m. to 4:30 p.m. Monday - Friday. Please note that voicemails left after 4:00 p.m. may not be returned until the following business day.  We are closed weekends and major holidays. You have access to a nurse at all times for urgent questions. Please call the main number to the clinic Dept: [phone] and follow the prompts.   For any non-urgent questions, you may also contact your provider using MyChart. We now offer e-Visits for anyone 76 and older to request care online for non-urgent symptoms. For details visit [email or web address].   Also download the MyChart app! Go to the app store, search "MyChart", open the app, select [facility], and log in with your MyChart username and password.  Due to Covid, a mask is required upon entering the hospital/clinic. If you do not have a mask, one will be given to you upon arrival. For doctor visits, patients may have 1 support person aged 32 or older with them. For treatment visits, patients cannot have anyone with them due to current Covid guidelines and our immunocompromised population.   Bevacizumab injection What is this medication? BEVACIZUMAB (be va SIZ yoo mab) is a monoclonal antibody. It is used to treat many types of cancer. This medicine may be used for other purposes; ask your health care provider or pharmacist if you have questions.  COMMON BRAND NAME(S): Avastin, MVASI, [name] What should I tell my care team before I take this medication? They need to know if you have any of these conditions: diabetes heart disease high blood pressure history of coughing up blood prior anthracycline chemotherapy (e.g., doxorubicin, daunorubicin,  epirubicin) recent or ongoing radiation therapy recent or planning to have surgery stroke an unusual or allergic reaction to bevacizumab, hamster proteins, mouse proteins, other medicines, foods, dyes, or preservatives pregnant or trying to get pregnant breast-feeding How should I use this medication? This medicine is for infusion into a vein. It is given by a health care professional in a hospital or clinic setting. Talk to your pediatrician regarding the use of this medicine in children. Special care may be needed. Overdosage: If you think you have taken too much of this medicine contact a poison control center or emergency room at once. NOTE: This medicine is only for you. Do not share this medicine with others. What if I miss a dose? It is important not to miss your dose. Call your doctor or health care professional if you are unable to keep an appointment. What may interact with this medication? Interactions are not expected. This list may not describe all possible interactions. Give your health care provider a list of all the medicines, herbs, non-prescription drugs, or dietary supplements you use. Also tell them if you smoke, drink alcohol, or use illegal drugs. Some items may interact with your medicine. What should I watch for while using this medication? Your condition will be monitored carefully while you are receiving this medicine. You will need important blood work and urine testing done while you are taking this medicine. This medicine may increase your risk to bruise or bleed. Call your doctor or health care professional if you notice any unusual bleeding. Before having surgery, talk to your health care provider to make sure it is ok. This drug can increase the risk of poor healing of your surgical site or wound. You will need to stop this drug for 28 days before surgery. After surgery, wait at least 28 days before restarting this drug. Make sure the surgical site or wound is  healed enough before restarting this drug. Talk to your health care provider if questions. Do not become pregnant while taking this medicine or for 6 months after stopping it. Women should inform their doctor if they wish to become pregnant or think they might be pregnant. There is a potential for serious side effects to an unborn child. Talk to your health care professional or pharmacist for more information. Do not breast-feed an infant while taking this medicine and for 6 months after the last dose. This medicine has caused ovarian failure in some women. This medicine may interfere with the ability to have a child. You should talk to your doctor or health care professional if you are concerned about your fertility. What side effects may I notice from receiving this medication? Side effects that you should report to your doctor or health care professional as soon as possible: allergic reactions like skin rash, itching or hives, swelling of the face, lips, or tongue chest pain or chest tightness chills coughing up blood high fever seizures severe constipation signs and symptoms of bleeding such as bloody or black, tarry stools; red or dark-brown urine; spitting up blood or brown material that looks like coffee grounds; red spots on the skin; unusual bruising or bleeding from the eye, gums, or nose signs and symptoms of a blood clot  such as breathing problems; chest pain; severe, sudden headache; pain, swelling, warmth in the leg signs and symptoms of a stroke like changes in vision; confusion; trouble speaking or understanding; severe headaches; sudden numbness or weakness of the face, arm or leg; trouble walking; dizziness; loss of balance or coordination stomach pain sweating swelling of legs or ankles vomiting weight gain Side effects that usually do not require medical attention (report to your doctor or health care professional if they continue or are bothersome): back pain changes in  taste decreased appetite dry skin nausea tiredness This list may not describe all possible side effects. Call your doctor for medical advice about side effects. You may report side effects to FDA at 1-800-FDA-1088. Where should I keep my medication? This drug is given in a hospital or clinic and will not be stored at home. NOTE: This sheet is a summary. It may not cover all possible information. If you have questions about this medicine, talk to your doctor, pharmacist, or health care provider.  2022 Elsevier/Gold Standard (2019-09-02 10:50:46)  Fluorouracil, 5-FU injection What is this medication? FLUOROURACIL, 5-FU (flure oh YOOR a sil) is a chemotherapy drug. It slows the growth of cancer cells. This medicine is used to treat many types of cancer like breast cancer, colon or rectal cancer, pancreatic cancer, and stomach cancer. This medicine may be used for other purposes; ask your health care provider or pharmacist if you have questions. COMMON BRAND NAME(S): Adrucil What should I tell my care team before I take this medication? They need to know if you have any of these conditions: blood disorders dihydropyrimidine dehydrogenase (DPD) deficiency infection (especially a virus infection such as chickenpox, cold sores, or herpes) kidney disease liver disease malnourished, poor nutrition recent or ongoing radiation therapy an unusual or allergic reaction to fluorouracil, other chemotherapy, other medicines, foods, dyes, or preservatives pregnant or trying to get pregnant breast-feeding How should I use this medication? This drug is given as an infusion or injection into a vein. It is administered in a hospital or clinic by a specially trained health care professional. Talk to your pediatrician regarding the use of this medicine in children. Special care may be needed. Overdosage: If you think you have taken too much of this medicine contact a poison control center or emergency room  at once. NOTE: This medicine is only for you. Do not share this medicine with others. What if I miss a dose? It is important not to miss your dose. Call your doctor or health care professional if you are unable to keep an appointment. What may interact with this medication? Do not take this medicine with any of the following medications: live virus vaccines This medicine may also interact with the following medications: medicines that treat or prevent blood clots like warfarin, enoxaparin, and dalteparin This list may not describe all possible interactions. Give your health care provider a list of all the medicines, herbs, non-prescription drugs, or dietary supplements you use. Also tell them if you smoke, drink alcohol, or use illegal drugs. Some items may interact with your medicine. What should I watch for while using this medication? Visit your doctor for checks on your progress. This drug may make you feel generally unwell. This is not uncommon, as chemotherapy can affect healthy cells as well as cancer cells. Report any side effects. Continue your course of treatment even though you feel ill unless your doctor tells you to stop. In some cases, you may be given additional medicines to  help with side effects. Follow all directions for their use. Call your doctor or health care professional for advice if you get a fever, chills or sore throat, or other symptoms of a cold or flu. Do not treat yourself. This drug decreases your body's ability to fight infections. Try to avoid being around people who are sick. This medicine may increase your risk to bruise or bleed. Call your doctor or health care professional if you notice any unusual bleeding. Be careful brushing and flossing your teeth or using a toothpick because you may get an infection or bleed more easily. If you have any dental work done, tell your dentist you are receiving this medicine. Avoid taking products that contain aspirin,  acetaminophen, ibuprofen, naproxen, or ketoprofen unless instructed by your doctor. These medicines may hide a fever. Do not become pregnant while taking this medicine. Women should inform their doctor if they wish to become pregnant or think they might be pregnant. There is a potential for serious side effects to an unborn child. Talk to your health care professional or pharmacist for more information. Do not breast-feed an infant while taking this medicine. Men should inform their doctor if they wish to father a child. This medicine may lower sperm counts. Do not treat diarrhea with over the counter products. Contact your doctor if you have diarrhea that lasts more than 2 days or if it is severe and watery. This medicine can make you more sensitive to the sun. Keep out of the sun. If you cannot avoid being in the sun, wear protective clothing and use sunscreen. Do not use sun lamps or tanning beds/booths. What side effects may I notice from receiving this medication? Side effects that you should report to your doctor or health care professional as soon as possible: allergic reactions like skin rash, itching or hives, swelling of the face, lips, or tongue low blood counts - this medicine may decrease the number of white blood cells, red blood cells and platelets. You may be at increased risk for infections and bleeding. signs of infection - fever or chills, cough, sore throat, pain or difficulty passing urine signs of decreased platelets or bleeding - bruising, pinpoint red spots on the skin, black, tarry stools, blood in the urine signs of decreased red blood cells - unusually weak or tired, fainting spells, lightheadedness breathing problems changes in vision chest pain mouth sores nausea and vomiting pain, swelling, redness at site where injected pain, tingling, numbness in the hands or feet redness, swelling, or sores on hands or feet stomach pain unusual bleeding Side effects that usually  do not require medical attention (report to your doctor or health care professional if they continue or are bothersome): changes in finger or toe nails diarrhea dry or itchy skin hair loss headache loss of appetite sensitivity of eyes to the light stomach upset unusually teary eyes This list may not describe all possible side effects. Call your doctor for medical advice about side effects. You may report side effects to FDA at 1-800-FDA-1088. Where should I keep my medication? This drug is given in a hospital or clinic and will not be stored at home. NOTE: This sheet is a summary. It may not cover all possible information. If you have questions about this medicine, talk to your doctor, pharmacist, or health care provider.  2022 Elsevier/Gold Standard (2019-10-06 15:00:03)  The chemotherapy medication bag should finish at 46 hours, 96 hours, or 7 days. For example, if your pump is scheduled for  46 hours and it was put on at 4:00 p.m., it should finish at 2:00 p.m. the day it is scheduled to come off regardless of your appointment time.     Estimated time to finish at 11:00 a.m. on Thursday 09/14/2021.   If the display on your pump reads "Low Volume" and it is beeping, take the batteries out of the pump and come to the cancer center for it to be taken off.   If the pump alarms go off prior to the pump reading "Low Volume" then call [phone] and someone can assist you.  If the plunger comes out and the chemotherapy medication is leaking out, please use your home chemo spill kit to clean up the spill. Do NOT use paper towels or other household products.  If you have problems or questions regarding your pump, please call either [phone] (24 hours a day) or the cancer center Monday-Friday 8:00 a.m.- 4:30 p.m. at the clinic number and we will assist you. If you are unable to get assistance, then go to the nearest Emergency Department and ask the staff to contact the IV team for  assistance.

## 2021-09-12 NOTE — Progress Notes (Signed)
[facility] OFFICE PROGRESS NOTE   Diagnosis: Colon cancer  INTERVAL HISTORY:   Kathy Robinson returns as scheduled.  She completed a cycle of 5-FU/Avastin 08/22/2021.  She denies nausea/vomiting.  No mouth sores.  No diarrhea.  No hand or foot pain or redness.  She reports persistent numbness in the fingertips and lower leg/toes.  No bleeding.  Objective:  Vital signs in last 24 hours:  Blood pressure 139/81, pulse 84, temperature 98.7 F (37.1 C), temperature source Oral, resp. rate 20, height $RemoveBe'5\' 6"'BRPtmPxKv$  (1.676 m), weight 148 lb 9.6 oz (67.4 kg), last menstrual period 11/19/1996, SpO2 100 %.    HEENT: No thrush or ulcers. Resp: Lungs clear bilaterally. Cardio: Regular rate and rhythm. GI: Abdomen soft and nontender.  No hepatomegaly. Vascular: No leg edema. Skin: Palms without erythema. Port-A-Cath without erythema.   Lab Results:  Lab Results  Component Value Date   WBC 5.0 09/12/2021   HGB 13.9 09/12/2021   HCT 41.6 09/12/2021   MCV 106.7 (H) 09/12/2021   PLT 185 09/12/2021   NEUTROABS 2.7 09/12/2021    Imaging:  No results found.  Medications: I have reviewed the patient's current medications.  Assessment/Plan: Adenocarcinoma the left colon, stage IIIc (pT4b,pN2a), status post a left colectomy 05/30/2020, MSS, TMB 13, BRAF V600E Tumor invades the visceral peritoneum, lymphovascular and perineural invasion present, for tumor deposits, 7/13 lymph nodes, negative resection margins, no loss of mismatch repair protein expression CT abdomen/pelvis 05/26/2020-long segment of masslike thickening of the descending colon with associated high-grade colonic obstruction, small colonic wall defect with evidence of a focally contained microperforation, retroperitoneal adenopathy, indeterminate small hypodense liver lesions Elevated preoperative CEA PET 06/22/2020-hypermetabolic liver metastases, periportal and periaortic adenopathy, hypermetabolic mediastinal and left  supraclavicular nodes Cycle 1 FOLFOX 07/05/20 Cycle 2 FOLFOXIRI (dose-reduced 5FU, no bolus) and Avastin 07/19/20  Cycle 3 FOLFOXIRI (Dose reduced 5-FU, no bolus)/Avastin 08/02/2020 Cycle 4 FOLFOXIRI/Avastin 08/16/2020 (Udenyca added) Cycle 5 FOLFOXIRI/Avastin 08/30/2020 CTs 09/09/2020-diminished size of lymph nodes in the chest, abdomen, and pelvis.  Decreased hepatic metastases.  No new evidence of disease progression, fat density lesion surrounding the left hemicolectomy Cycle 6 FOLFOXIRI/Avastin 09/13/2020 Cycle 7 FOLFOX/Avastin 09/27/2020 (irinotecan held) Cycle 8 FOLFOXIRI/Avastin 10/18/2020  Cycle 9 FOLFOXIRI/Avastin 11/01/2020 Cycle 10 FOLFOXIRI/Avastin 11/22/2020 (oxaliplatin held, irinotecan and 5-FU dose reduced) Cycle 11 FOLFOXIRI/Avastin 12/12/2020 (oxaliplatin held) CTs 12/19/2020-decrease in size of liver metastases, no evidence of disease progression Cycle 12 FOLFOXIRI/Avastin 01/04/2021 (oxaliplatin held) Cycle 13 FOLFOXIRI/Avastin 01/24/2021 (oxaliplatin held) Cycle 14 FOLFOXIRI/Avastin 02/14/2021 (oxaliplatin held) Cycle 15 FOLFOXIRI/Avastin 03/07/2021 (oxaliplatin held) Cycle 16 FOLFOXIRI/Avastin 03/29/2021 (oxaliplatin held) CTs 04/14/2021- decreased size of liver metastases.  No new or progressive findings. Cycle 17 FOLFOXIRI/Avastin 04/19/2021 (oxaliplatin held) Cycle 18 FOLFOXIRI/Avastin 05/09/2021 (oxaliplatin held) Cycle 19 FOLFOXIRI/Avastin 05/30/2021 (oxaliplatin held) Cycle 20 FOLFOXIRI/Avastin 06/20/2021 (oxaliplatin held) Cycle 21 FOLFOXIRI/Avastin 07/11/2021 (oxaliplatin held) CTs 07/28/2021-unchanged subcentimeter liver lesions, no evidence of new metastatic disease Cycle 22 5-FU/ Avastin 08/01/2021 Cycle 23 5-FU/Avastin 08/22/2021 Cycle 24 5-FU/Avastin 09/12/2021   Atrial fibrillation with rapid ventricular response 05/26/2020 Mild neutropenia following cycle 3 FOLFOXIRI, Udenyca added with cycle 4 Hypokalemia secondary to diarrhea-potassium supplementation starting  09/13/2020 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  She is tolerating 5-FU/Avastin well.  Plan to continue on a 3-week schedule, treatment today.  CBC from today reviewed.  Counts adequate to proceed as above.  She will return for lab,  follow-up, 5-FU/Avastin in 3 weeks.    Ned Card ANP/GNP-BC   09/12/2021  9:04 AM

## 2021-09-12 NOTE — Progress Notes (Signed)
Patient seen by Ned Card NP today  Vitals are within treatment parameters.  Labs reviewed by Ned Card NP and are within treatment parameters.  Per physician team, patient is ready for treatment and there are NO modifications to the treatment plan.   Provider waiting on chemistry panel

## 2021-09-12 NOTE — Progress Notes (Signed)
Patient presents for treatment. RN assessment completed along with the following:  Labs/vitals reviewed - Yes, and within treatment parameters.   Weight within 10% of previous measurement - Yes Oncology Treatment Attestation completed for current therapy- Yes, on date 06/27/20 Informed consent completed and reflects current therapy/intent - Yes, on date 07/19/20             Provider progress note reviewed - Yes, today's provider note was reviewed. Treatment/Antibody/Supportive plan reviewed - Yes, and there are no adjustments needed for today's treatment. S&H and other orders reviewed - Yes, and there are no additional orders identified. Previous treatment date reviewed - Yes, and the appropriate amount of time has elapsed between treatments. Clinic Hand Off Received from - none  Patient to proceed with treatment.

## 2021-09-14 ENCOUNTER — Other Ambulatory Visit: Payer: Self-pay

## 2021-09-14 ENCOUNTER — Inpatient Hospital Stay: Payer: Medicare Other

## 2021-09-14 VITALS — BP 134/76 | HR 90 | Temp 98.4°F | Resp 20

## 2021-09-14 DIAGNOSIS — Z5111 Encounter for antineoplastic chemotherapy: Secondary | ICD-10-CM | POA: Diagnosis not present

## 2021-09-14 DIAGNOSIS — C787 Secondary malignant neoplasm of liver and intrahepatic bile duct: Secondary | ICD-10-CM | POA: Diagnosis not present

## 2021-09-14 DIAGNOSIS — C185 Malignant neoplasm of splenic flexure: Secondary | ICD-10-CM

## 2021-09-14 DIAGNOSIS — C182 Malignant neoplasm of ascending colon: Secondary | ICD-10-CM | POA: Diagnosis not present

## 2021-09-14 DIAGNOSIS — I4891 Unspecified atrial fibrillation: Secondary | ICD-10-CM | POA: Diagnosis not present

## 2021-09-14 DIAGNOSIS — D709 Neutropenia, unspecified: Secondary | ICD-10-CM | POA: Diagnosis not present

## 2021-09-14 DIAGNOSIS — E876 Hypokalemia: Secondary | ICD-10-CM | POA: Diagnosis not present

## 2021-09-14 DIAGNOSIS — G620 Drug-induced polyneuropathy: Secondary | ICD-10-CM | POA: Diagnosis not present

## 2021-09-14 MED ORDER — HEPARIN SOD (PORK) LOCK FLUSH 100 UNIT/ML IV SOLN
500.0000 [IU] | Freq: Once | INTRAVENOUS | Status: AC | PRN
  Administered 2021-09-14: 500 [IU]

## 2021-09-14 MED ORDER — SODIUM CHLORIDE 0.9% FLUSH
10.0000 mL | INTRAVENOUS | Status: DC | PRN
  Administered 2021-09-14: 10 mL

## 2021-09-14 NOTE — Patient Instructions (Signed)
Implanted Port Home Guide An implanted port is a device that is placed under the skin. It is usually placed in the chest. The device can be used to give IV medicine, to take blood, or for dialysis. You may have an implanted port if: You need IV medicine that would be irritating to the small veins in your hands or arms. You need IV medicines, such as antibiotics, for a long period of time. You need IV nutrition for a long period of time. You need dialysis. When you have a port, your health care provider can choose to use the port instead of veins in your arms for these procedures. You may have fewer limitations when using a port than you would if you used other types of long-term IVs, and you will likely be able to return to normal activities after your incision heals. An implanted port has two main parts: Reservoir. The reservoir is the part where a needle is inserted to give medicines or draw blood. The reservoir is round. After it is placed, it appears as a small, raised area under your skin. Catheter. The catheter is a thin, flexible tube that connects the reservoir to a vein. Medicine that is inserted into the reservoir goes into the catheter and then into the vein. How is my port accessed? To access your port: A numbing cream may be placed on the skin over the port site. Your health care provider will put on a mask and sterile gloves. The skin over your port will be cleaned carefully with a germ-killing soap and allowed to dry. Your health care provider will gently pinch the port and insert a needle into it. Your health care provider will check for a blood return to make sure the port is in the vein and is not clogged. If your port needs to remain accessed to get medicine continuously (constant infusion), your health care provider will place a clear bandage (dressing) over the needle site. The dressing and needle will need to be changed every week, or as told by your health care provider. What  is flushing? Flushing helps keep the port from getting clogged. Follow instructions from your health care provider about how and when to flush the port. Ports are usually flushed with saline solution or a medicine called heparin. The need for flushing will depend on how the port is used: If the port is only used from time to time to give medicines or draw blood, the port may need to be flushed: Before and after medicines have been given. Before and after blood has been drawn. As part of routine maintenance. Flushing may be recommended every 4-6 weeks. If a constant infusion is running, the port may not need to be flushed. Throw away any syringes in a disposal container that is meant for sharp items (sharps container). You can buy a sharps container from a pharmacy, or you can make one by using an empty hard plastic bottle with a cover. How long will my port stay implanted? The port can stay in for as long as your health care provider thinks it is needed. When it is time for the port to come out, a surgery will be done to remove it. The surgery will be similar to the procedure that was done to put the port in. Follow these instructions at home:  Flush your port as told by your health care provider. If you need an infusion over several days, follow instructions from your health care provider about how   to take care of your port site. Make sure you: Wash your hands with soap and water before you change your dressing. If soap and water are not available, use alcohol-based hand sanitizer. Change your dressing as told by your health care provider. Place any used dressings or infusion bags into a plastic bag. Throw that bag in the trash. Keep the dressing that covers the needle clean and dry. Do not get it wet. Do not use scissors or sharp objects near the tube. Keep the tube clamped, unless it is being used. Check your port site every day for signs of infection. Check for: Redness, swelling, or  pain. Fluid or blood. Pus or a bad smell. Protect the skin around the port site. Avoid wearing bra straps that rub or irritate the site. Protect the skin around your port from seat belts. Place a soft pad over your chest if needed. Bathe or shower as told by your health care provider. The site may get wet as long as you are not actively receiving an infusion. Return to your normal activities as told by your health care provider. Ask your health care provider what activities are safe for you. Carry a medical alert card or wear a medical alert bracelet at all times. This will let health care providers know that you have an implanted port in case of an emergency. Get help right away if: You have redness, swelling, or pain at the port site. You have fluid or blood coming from your port site. You have pus or a bad smell coming from the port site. You have a fever. Summary Implanted ports are usually placed in the chest for long-term IV access. Follow instructions from your health care provider about flushing the port and changing bandages (dressings). Take care of the area around your port by avoiding clothing that puts pressure on the area, and by watching for signs of infection. Protect the skin around your port from seat belts. Place a soft pad over your chest if needed. Get help right away if you have a fever or you have redness, swelling, pain, drainage, or a bad smell at the port site. This information is not intended to replace advice given to you by your health care provider. Make sure you discuss any questions you have with your health care provider. Document Revised: 01/25/2021 Document Reviewed: 03/21/2020 Elsevier Patient Education  2022 Elsevier Inc.  

## 2021-10-01 ENCOUNTER — Other Ambulatory Visit: Payer: Self-pay | Admitting: Oncology

## 2021-10-03 ENCOUNTER — Encounter: Payer: Self-pay | Admitting: Oncology

## 2021-10-03 ENCOUNTER — Inpatient Hospital Stay: Payer: Medicare Other | Admitting: Oncology

## 2021-10-03 ENCOUNTER — Other Ambulatory Visit (HOSPITAL_BASED_OUTPATIENT_CLINIC_OR_DEPARTMENT_OTHER): Payer: Self-pay

## 2021-10-03 ENCOUNTER — Other Ambulatory Visit (HOSPITAL_COMMUNITY): Payer: Self-pay

## 2021-10-03 ENCOUNTER — Inpatient Hospital Stay: Payer: Medicare Other | Attending: Oncology

## 2021-10-03 ENCOUNTER — Other Ambulatory Visit: Payer: Self-pay

## 2021-10-03 ENCOUNTER — Inpatient Hospital Stay: Payer: Medicare Other

## 2021-10-03 VITALS — BP 151/78 | HR 89 | Temp 98.7°F | Resp 18 | Ht 66.0 in | Wt 148.4 lb

## 2021-10-03 DIAGNOSIS — C185 Malignant neoplasm of splenic flexure: Secondary | ICD-10-CM

## 2021-10-03 DIAGNOSIS — I4891 Unspecified atrial fibrillation: Secondary | ICD-10-CM | POA: Diagnosis not present

## 2021-10-03 DIAGNOSIS — Z5112 Encounter for antineoplastic immunotherapy: Secondary | ICD-10-CM | POA: Insufficient documentation

## 2021-10-03 DIAGNOSIS — D709 Neutropenia, unspecified: Secondary | ICD-10-CM | POA: Insufficient documentation

## 2021-10-03 DIAGNOSIS — Z5111 Encounter for antineoplastic chemotherapy: Secondary | ICD-10-CM | POA: Diagnosis not present

## 2021-10-03 DIAGNOSIS — G629 Polyneuropathy, unspecified: Secondary | ICD-10-CM | POA: Diagnosis not present

## 2021-10-03 DIAGNOSIS — E876 Hypokalemia: Secondary | ICD-10-CM | POA: Insufficient documentation

## 2021-10-03 DIAGNOSIS — C787 Secondary malignant neoplasm of liver and intrahepatic bile duct: Secondary | ICD-10-CM | POA: Insufficient documentation

## 2021-10-03 LAB — CBC WITH DIFFERENTIAL (CANCER CENTER ONLY)
Abs Immature Granulocytes: 0 10*3/uL (ref 0.00–0.07)
Basophils Absolute: 0 10*3/uL (ref 0.0–0.1)
Basophils Relative: 1 %
Eosinophils Absolute: 0.1 10*3/uL (ref 0.0–0.5)
Eosinophils Relative: 2 %
HCT: 42.7 % (ref 36.0–46.0)
Hemoglobin: 14.2 g/dL (ref 12.0–15.0)
Immature Granulocytes: 0 %
Lymphocytes Relative: 31 %
Lymphs Abs: 1.4 10*3/uL (ref 0.7–4.0)
MCH: 35.3 pg — ABNORMAL HIGH (ref 26.0–34.0)
MCHC: 33.3 g/dL (ref 30.0–36.0)
MCV: 106.2 fL — ABNORMAL HIGH (ref 80.0–100.0)
Monocytes Absolute: 0.4 10*3/uL (ref 0.1–1.0)
Monocytes Relative: 9 %
Neutro Abs: 2.6 10*3/uL (ref 1.7–7.7)
Neutrophils Relative %: 57 %
Platelet Count: 182 10*3/uL (ref 150–400)
RBC: 4.02 MIL/uL (ref 3.87–5.11)
RDW: 12.4 % (ref 11.5–15.5)
WBC Count: 4.6 10*3/uL (ref 4.0–10.5)
nRBC: 0 % (ref 0.0–0.2)

## 2021-10-03 LAB — CEA (ACCESS): CEA (CHCC): 1.54 ng/mL (ref 0.00–5.00)

## 2021-10-03 LAB — CMP (CANCER CENTER ONLY)
ALT: 18 U/L (ref 0–44)
AST: 22 U/L (ref 15–41)
Albumin: 4.1 g/dL (ref 3.5–5.0)
Alkaline Phosphatase: 65 U/L (ref 38–126)
Anion gap: 7 (ref 5–15)
BUN: 16 mg/dL (ref 8–23)
CO2: 27 mmol/L (ref 22–32)
Calcium: 9.3 mg/dL (ref 8.9–10.3)
Chloride: 104 mmol/L (ref 98–111)
Creatinine: 0.82 mg/dL (ref 0.44–1.00)
GFR, Estimated: >60 mL/min (ref >60)
Glucose, Bld: 86 mg/dL (ref 70–99)
Potassium: 4.6 mmol/L (ref 3.5–5.1)
Sodium: 138 mmol/L (ref 135–145)
Total Bilirubin: 0.5 mg/dL (ref 0.3–1.2)
Total Protein: 7.2 g/dL (ref 6.5–8.1)

## 2021-10-03 LAB — TOTAL PROTEIN, URINE DIPSTICK: Protein, ur: NEGATIVE mg/dL

## 2021-10-03 MED ORDER — POTASSIUM CHLORIDE ER 10 MEQ PO CPCR
ORAL_CAPSULE | Freq: Two times a day (BID) | ORAL | 2 refills | Status: DC
  Filled 2021-10-03 (×2): qty 60, 30d supply, fill #0
  Filled 2021-10-03: qty 60, fill #0
  Filled 2021-10-04: qty 60, 30d supply, fill #0
  Filled 2021-11-15: qty 60, 30d supply, fill #1
  Filled 2021-12-19: qty 60, 30d supply, fill #2

## 2021-10-03 MED ORDER — SODIUM CHLORIDE 0.9 % IV SOLN
1600.0000 mg/m2 | INTRAVENOUS | Status: DC
  Administered 2021-10-03: 2650 mg via INTRAVENOUS
  Filled 2021-10-03: qty 53

## 2021-10-03 MED ORDER — SODIUM CHLORIDE 0.9 % IV SOLN
7.5000 mg/kg | Freq: Once | INTRAVENOUS | Status: AC
  Administered 2021-10-03: 500 mg via INTRAVENOUS
  Filled 2021-10-03: qty 4

## 2021-10-03 MED ORDER — SODIUM CHLORIDE 0.9 % IV SOLN: Freq: Once | INTRAVENOUS | Status: AC

## 2021-10-03 NOTE — Progress Notes (Signed)
[facility] OFFICE PROGRESS NOTE   Diagnosis: Colon cancer  INTERVAL HISTORY:   Kathy Robinson completed another cycle of 5-FU/Avastin on 09/12/2021.  No nausea, mouth sores, or diarrhea.  No bleeding or symptom of thrombosis.  No new complaint.  She continues to have neuropathy symptoms in the hands and feet.  This does not interfere with activity.  Objective:  Vital signs in last 24 hours:  Blood pressure (!) 151/78, pulse 89, temperature 98.7 F (37.1 C), temperature source Oral, resp. rate 18, height $RemoveBe'5\' 6"'nRoRTmJDb$  (1.676 m), weight 148 lb 6.4 oz (67.3 kg), last menstrual period 11/19/1996, SpO2 100 %.    HEENT: No thrush, healing ulcer at the left buccal mucosa Resp: Lungs clear bilaterally Cardio: Regular rate and rhythm GI: No hepatosplenomegaly, no mass, nontender Vascular: No leg edema  Skin: Palms without erythema Portacath/PICC-without erythema  Lab Results:  Lab Results  Component Value Date   WBC 4.6 10/03/2021   HGB 14.2 10/03/2021   HCT 42.7 10/03/2021   MCV 106.2 (H) 10/03/2021   PLT 182 10/03/2021   NEUTROABS 2.6 10/03/2021    CMP  Lab Results  Component Value Date   NA 136 09/12/2021   K 4.3 09/12/2021   CL 104 09/12/2021   CO2 26 09/12/2021   GLUCOSE 83 09/12/2021   BUN 19 09/12/2021   CREATININE 0.75 09/12/2021   CALCIUM 8.9 09/12/2021   PROT 6.7 09/12/2021   ALBUMIN 3.9 09/12/2021   AST 21 09/12/2021   ALT 17 09/12/2021   ALKPHOS 76 09/12/2021   BILITOT 0.5 09/12/2021   GFRNONAA >60 09/12/2021   GFRAA >60 08/16/2020    Lab Results  Component Value Date   CEA1 1.16 03/29/2021   CEA 3.08 09/12/2021     Medications: I have reviewed the patient's current medications.   Assessment/Plan: Adenocarcinoma the left colon, stage IIIc (pT4b,pN2a), status post a left colectomy 05/30/2020, MSS, TMB 13, BRAF V600E Tumor invades the visceral peritoneum, lymphovascular and perineural invasion present, for tumor deposits, 7/13 lymph  nodes, negative resection margins, no loss of mismatch repair protein expression CT abdomen/pelvis 05/26/2020-long segment of masslike thickening of the descending colon with associated high-grade colonic obstruction, small colonic wall defect with evidence of a focally contained microperforation, retroperitoneal adenopathy, indeterminate small hypodense liver lesions Elevated preoperative CEA PET 06/22/2020-hypermetabolic liver metastases, periportal and periaortic adenopathy, hypermetabolic mediastinal and left supraclavicular nodes Cycle 1 FOLFOX 07/05/20 Cycle 2 FOLFOXIRI (dose-reduced 5FU, no bolus) and Avastin 07/19/20  Cycle 3 FOLFOXIRI (Dose reduced 5-FU, no bolus)/Avastin 08/02/2020 Cycle 4 FOLFOXIRI/Avastin 08/16/2020 (Udenyca added) Cycle 5 FOLFOXIRI/Avastin 08/30/2020 CTs 09/09/2020-diminished size of lymph nodes in the chest, abdomen, and pelvis.  Decreased hepatic metastases.  No new evidence of disease progression, fat density lesion surrounding the left hemicolectomy Cycle 6 FOLFOXIRI/Avastin 09/13/2020 Cycle 7 FOLFOX/Avastin 09/27/2020 (irinotecan held) Cycle 8 FOLFOXIRI/Avastin 10/18/2020  Cycle 9 FOLFOXIRI/Avastin 11/01/2020 Cycle 10 FOLFOXIRI/Avastin 11/22/2020 (oxaliplatin held, irinotecan and 5-FU dose reduced) Cycle 11 FOLFOXIRI/Avastin 12/12/2020 (oxaliplatin held) CTs 12/19/2020-decrease in size of liver metastases, no evidence of disease progression Cycle 12 FOLFOXIRI/Avastin 01/04/2021 (oxaliplatin held) Cycle 13 FOLFOXIRI/Avastin 01/24/2021 (oxaliplatin held) Cycle 14 FOLFOXIRI/Avastin 02/14/2021 (oxaliplatin held) Cycle 15 FOLFOXIRI/Avastin 03/07/2021 (oxaliplatin held) Cycle 16 FOLFOXIRI/Avastin 03/29/2021 (oxaliplatin held) CTs 04/14/2021- decreased size of liver metastases.  No new or progressive findings. Cycle 17 FOLFOXIRI/Avastin 04/19/2021 (oxaliplatin held) Cycle 18 FOLFOXIRI/Avastin 05/09/2021 (oxaliplatin held) Cycle 19 FOLFOXIRI/Avastin 05/30/2021 (oxaliplatin held) Cycle 20  FOLFOXIRI/Avastin 06/20/2021 (oxaliplatin held) Cycle 21 FOLFOXIRI/Avastin 07/11/2021 (oxaliplatin held) CTs 07/28/2021-unchanged subcentimeter liver  lesions, no evidence of new metastatic disease Cycle 22 5-FU/ Avastin 08/01/2021 Cycle 23 5-FU/Avastin 08/22/2021 Cycle 24 5-FU/Avastin 09/12/2021 Cycle 25 5-FU/Avastin 10/03/2021   Atrial fibrillation with rapid ventricular response 05/26/2020 Mild neutropenia following cycle 3 FOLFOXIRI, Udenyca added with cycle 4 Hypokalemia secondary to diarrhea-potassium supplementation starting 09/13/2020 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  She is tolerating the 5-FU/Avastin well.  She will complete another cycle today.  She will return for an office visit and chemotherapy in 3 weeks.  We will plan for a restaging CT evaluation in January  [name], MD  10/03/2021  11:12 AM

## 2021-10-03 NOTE — Progress Notes (Signed)
Patient seen by Dr. Sherrill today ? ?Vitals are within treatment parameters. ? ?Labs reviewed by Dr. Sherrill and are within treatment parameters. ? ?Per physician team, patient is ready for treatment and there are NO modifications to the treatment plan.  ?

## 2021-10-03 NOTE — Patient Instructions (Addendum)
The chemotherapy medication bag should finish at 46 hours, 96 hours, or 7 days. For example, if your pump is scheduled for 46 hours and it was put on at 4:00 p.m., it should finish at 2:00 p.m. the day it is scheduled to come off regardless of your appointment time.     Estimated time to finish at Thursday, November 17 at 1:00   If the display on your pump reads "Low Volume" and it is beeping, take the batteries out of the pump and come to the cancer center for it to be taken off.   If the pump alarms go off prior to the pump reading "Low Volume" then call [phone] and someone can assist you.  If the plunger comes out and the chemotherapy medication is leaking out, please use your home chemo spill kit to clean up the spill. Do NOT use paper towels or other household products.  If you have problems or questions regarding your pump, please call either [phone] (24 hours a day) or the cancer center Monday-Friday 8:00 a.m.- 4:30 p.m. at the clinic number and we will assist you. If you are unable to get assistance, then go to the nearest Emergency Department and ask the staff to contact the IV team for assistance.  [facility]   Discharge Instructions: Thank you for choosing [facility] to provide your oncology and hematology care.   If you have a lab appointment with the [facility], please go directly to the [facility] and check in at the registration area.   Wear comfortable clothing and clothing appropriate for easy access to any Portacath or PICC line.   We strive to give you quality time with your provider. You may need to reschedule your appointment if you arrive late (15 or more minutes).  Arriving late affects you and other patients whose appointments are after yours.  Also, if you miss three or more appointments without notifying the office, you may be dismissed from the clinic at the provider's discretion.      For prescription  refill requests, have your pharmacy contact our office and allow 72 hours for refills to be completed.    Today you received the following chemotherapy and/or immunotherapy agents Bevacizumab-bvzr, fluorouracil      To help prevent nausea and vomiting after your treatment, we encourage you to take your nausea medication as directed.  BELOW ARE SYMPTOMS THAT SHOULD BE REPORTED IMMEDIATELY: *FEVER GREATER THAN 100.4 F (38 C) OR HIGHER *CHILLS OR SWEATING *NAUSEA AND VOMITING THAT IS NOT CONTROLLED WITH YOUR NAUSEA MEDICATION *UNUSUAL SHORTNESS OF BREATH *UNUSUAL BRUISING OR BLEEDING *URINARY PROBLEMS (pain or burning when urinating, or frequent urination) *BOWEL PROBLEMS (unusual diarrhea, constipation, pain near the anus) TENDERNESS IN MOUTH AND THROAT WITH OR WITHOUT PRESENCE OF ULCERS (sore throat, sores in mouth, or a toothache) UNUSUAL RASH, SWELLING OR PAIN  UNUSUAL VAGINAL DISCHARGE OR ITCHING   Items with * indicate a potential emergency and should be followed up as soon as possible or go to the Emergency Department if any problems should occur.  Please show the CHEMOTHERAPY ALERT CARD or IMMUNOTHERAPY ALERT CARD at check-in to the Emergency Department and triage nurse.  Should you have questions after your visit or need to cancel or reschedule your appointment, please contact [facility]  Dept: [phone]  and follow the prompts.  Office hours are 8:00 a.m. to 4:30 p.m. Monday - Friday. Please note that voicemails left  after 4:00 p.m. may not be returned until the following business day.  We are closed weekends and major holidays. You have access to a nurse at all times for urgent questions. Please call the main number to the clinic Dept: [phone] and follow the prompts.   For any non-urgent questions, you may also contact your provider using MyChart. We now offer e-Visits for anyone 22 and older to request care online for non-urgent symptoms. For  details visit [email or web address].   Also download the MyChart app! Go to the app store, search "MyChart", open the app, select [facility], and log in with your MyChart username and password.  Due to Covid, a mask is required upon entering the hospital/clinic. If you do not have a mask, one will be given to you upon arrival. For doctor visits, patients may have 1 support person aged 5 or older with them. For treatment visits, patients cannot have anyone with them due to current Covid guidelines and our immunocompromised population.   Bevacizumab injection What is this medication? BEVACIZUMAB (be va SIZ yoo mab) is a monoclonal antibody. It is used to treat many types of cancer. This medicine may be used for other purposes; ask your health care provider or pharmacist if you have questions. COMMON BRAND NAME(S): Alymsys, Avastin, MVASI, [name] What should I tell my care team before I take this medication? They need to know if you have any of these conditions: diabetes heart disease high blood pressure history of coughing up blood prior anthracycline chemotherapy (e.g., doxorubicin, daunorubicin, epirubicin) recent or ongoing radiation therapy recent or planning to have surgery stroke an unusual or allergic reaction to bevacizumab, hamster proteins, mouse proteins, other medicines, foods, dyes, or preservatives pregnant or trying to get pregnant breast-feeding How should I use this medication? This medicine is for infusion into a vein. It is given by a health care professional in a hospital or clinic setting. Talk to your pediatrician regarding the use of this medicine in children. Special care may be needed. Overdosage: If you think you have taken too much of this medicine contact a poison control center or emergency room at once. NOTE: This medicine is only for you. Do not share this medicine with others. What if I miss a dose? It is important not to miss your dose. Call your  doctor or health care professional if you are unable to keep an appointment. What may interact with this medication? Interactions are not expected. This list may not describe all possible interactions. Give your health care provider a list of all the medicines, herbs, non-prescription drugs, or dietary supplements you use. Also tell them if you smoke, drink alcohol, or use illegal drugs. Some items may interact with your medicine. What should I watch for while using this medication? Your condition will be monitored carefully while you are receiving this medicine. You will need important blood work and urine testing done while you are taking this medicine. This medicine may increase your risk to bruise or bleed. Call your doctor or health care professional if you notice any unusual bleeding. Before having surgery, talk to your health care provider to make sure it is ok. This drug can increase the risk of poor healing of your surgical site or wound. You will need to stop this drug for 28 days before surgery. After surgery, wait at least 28 days before restarting this drug. Make sure the surgical site or wound is healed enough before restarting this drug. Talk to your health care provider  if questions. Do not become pregnant while taking this medicine or for 6 months after stopping it. Women should inform their doctor if they wish to become pregnant or think they might be pregnant. There is a potential for serious side effects to an unborn child. Talk to your health care professional or pharmacist for more information. Do not breast-feed an infant while taking this medicine and for 6 months after the last dose. This medicine has caused ovarian failure in some women. This medicine may interfere with the ability to have a child. You should talk to your doctor or health care professional if you are concerned about your fertility. What side effects may I notice from receiving this medication? Side effects that  you should report to your doctor or health care professional as soon as possible: allergic reactions like skin rash, itching or hives, swelling of the face, lips, or tongue chest pain or chest tightness chills coughing up blood high fever seizures severe constipation signs and symptoms of bleeding such as bloody or black, tarry stools; red or dark-brown urine; spitting up blood or brown material that looks like coffee grounds; red spots on the skin; unusual bruising or bleeding from the eye, gums, or nose signs and symptoms of a blood clot such as breathing problems; chest pain; severe, sudden headache; pain, swelling, warmth in the leg signs and symptoms of a stroke like changes in vision; confusion; trouble speaking or understanding; severe headaches; sudden numbness or weakness of the face, arm or leg; trouble walking; dizziness; loss of balance or coordination stomach pain sweating swelling of legs or ankles vomiting weight gain Side effects that usually do not require medical attention (report to your doctor or health care professional if they continue or are bothersome): back pain changes in taste decreased appetite dry skin nausea tiredness This list may not describe all possible side effects. Call your doctor for medical advice about side effects. You may report side effects to FDA at 1-800-FDA-1088. Where should I keep my medication? This drug is given in a hospital or clinic and will not be stored at home. NOTE: This sheet is a summary. It may not cover all possible information. If you have questions about this medicine, talk to your doctor, pharmacist, or health care provider.  2022 Elsevier/Gold Standard (2021-07-25 00:00:00)  Fluorouracil, 5-FU injection What is this medication? FLUOROURACIL, 5-FU (flure oh YOOR a sil) is a chemotherapy drug. It slows the growth of cancer cells. This medicine is used to treat many types of cancer like breast cancer, colon or rectal  cancer, pancreatic cancer, and stomach cancer. This medicine may be used for other purposes; ask your health care provider or pharmacist if you have questions. COMMON BRAND NAME(S): Adrucil What should I tell my care team before I take this medication? They need to know if you have any of these conditions: blood disorders dihydropyrimidine dehydrogenase (DPD) deficiency infection (especially a virus infection such as chickenpox, cold sores, or herpes) kidney disease liver disease malnourished, poor nutrition recent or ongoing radiation therapy an unusual or allergic reaction to fluorouracil, other chemotherapy, other medicines, foods, dyes, or preservatives pregnant or trying to get pregnant breast-feeding How should I use this medication? This drug is given as an infusion or injection into a vein. It is administered in a hospital or clinic by a specially trained health care professional. Talk to your pediatrician regarding the use of this medicine in children. Special care may be needed. Overdosage: If you think you have taken  too much of this medicine contact a poison control center or emergency room at once. NOTE: This medicine is only for you. Do not share this medicine with others. What if I miss a dose? It is important not to miss your dose. Call your doctor or health care professional if you are unable to keep an appointment. What may interact with this medication? Do not take this medicine with any of the following medications: live virus vaccines This medicine may also interact with the following medications: medicines that treat or prevent blood clots like warfarin, enoxaparin, and dalteparin This list may not describe all possible interactions. Give your health care provider a list of all the medicines, herbs, non-prescription drugs, or dietary supplements you use. Also tell them if you smoke, drink alcohol, or use illegal drugs. Some items may interact with your medicine. What  should I watch for while using this medication? Visit your doctor for checks on your progress. This drug may make you feel generally unwell. This is not uncommon, as chemotherapy can affect healthy cells as well as cancer cells. Report any side effects. Continue your course of treatment even though you feel ill unless your doctor tells you to stop. In some cases, you may be given additional medicines to help with side effects. Follow all directions for their use. Call your doctor or health care professional for advice if you get a fever, chills or sore throat, or other symptoms of a cold or flu. Do not treat yourself. This drug decreases your body's ability to fight infections. Try to avoid being around people who are sick. This medicine may increase your risk to bruise or bleed. Call your doctor or health care professional if you notice any unusual bleeding. Be careful brushing and flossing your teeth or using a toothpick because you may get an infection or bleed more easily. If you have any dental work done, tell your dentist you are receiving this medicine. Avoid taking products that contain aspirin, acetaminophen, ibuprofen, naproxen, or ketoprofen unless instructed by your doctor. These medicines may hide a fever. Do not become pregnant while taking this medicine. Women should inform their doctor if they wish to become pregnant or think they might be pregnant. There is a potential for serious side effects to an unborn child. Talk to your health care professional or pharmacist for more information. Do not breast-feed an infant while taking this medicine. Men should inform their doctor if they wish to father a child. This medicine may lower sperm counts. Do not treat diarrhea with over the counter products. Contact your doctor if you have diarrhea that lasts more than 2 days or if it is severe and watery. This medicine can make you more sensitive to the sun. Keep out of the sun. If you cannot avoid  being in the sun, wear protective clothing and use sunscreen. Do not use sun lamps or tanning beds/booths. What side effects may I notice from receiving this medication? Side effects that you should report to your doctor or health care professional as soon as possible: allergic reactions like skin rash, itching or hives, swelling of the face, lips, or tongue low blood counts - this medicine may decrease the number of white blood cells, red blood cells and platelets. You may be at increased risk for infections and bleeding. signs of infection - fever or chills, cough, sore throat, pain or difficulty passing urine signs of decreased platelets or bleeding - bruising, pinpoint red spots on the skin, black, tarry  stools, blood in the urine signs of decreased red blood cells - unusually weak or tired, fainting spells, lightheadedness breathing problems changes in vision chest pain mouth sores nausea and vomiting pain, swelling, redness at site where injected pain, tingling, numbness in the hands or feet redness, swelling, or sores on hands or feet stomach pain unusual bleeding Side effects that usually do not require medical attention (report to your doctor or health care professional if they continue or are bothersome): changes in finger or toe nails diarrhea dry or itchy skin hair loss headache loss of appetite sensitivity of eyes to the light stomach upset unusually teary eyes This list may not describe all possible side effects. Call your doctor for medical advice about side effects. You may report side effects to FDA at 1-800-FDA-1088. Where should I keep my medication? This drug is given in a hospital or clinic and will not be stored at home. NOTE: This sheet is a summary. It may not cover all possible information. If you have questions about this medicine, talk to your doctor, pharmacist, or health care provider.  2022 Elsevier/Gold Standard (2021-07-25 00:00:00)

## 2021-10-03 NOTE — Progress Notes (Signed)
Patient presents for treatment. RN assessment completed along with the following:  Labs/vitals reviewed - Yes, and within treatment parameters.   Weight within 10% of previous measurement - Yes Oncology Treatment Attestation completed for current therapy- Yes, on date 06/27/20 Informed consent completed and reflects current therapy/intent - Yes, on date 07/09/20             Provider progress note reviewed - Yes, today's provider note was reviewed. Treatment/Antibody/Supportive plan reviewed - Yes, and there are no adjustments needed for today's treatment. S&H and other orders reviewed - Yes, and there are no additional orders identified. Previous treatment date reviewed - Yes, and the appropriate amount of time has elapsed between treatments. Clinic Hand Off Received from - Stasia Cavalier, RN  Patient to proceed with treatment.

## 2021-10-04 ENCOUNTER — Other Ambulatory Visit (HOSPITAL_COMMUNITY): Payer: Self-pay

## 2021-10-05 ENCOUNTER — Other Ambulatory Visit: Payer: Self-pay

## 2021-10-05 ENCOUNTER — Inpatient Hospital Stay: Payer: Medicare Other

## 2021-10-05 VITALS — BP 145/71 | HR 79 | Temp 98.7°F | Resp 20

## 2021-10-05 DIAGNOSIS — E876 Hypokalemia: Secondary | ICD-10-CM | POA: Diagnosis not present

## 2021-10-05 DIAGNOSIS — C185 Malignant neoplasm of splenic flexure: Secondary | ICD-10-CM

## 2021-10-05 DIAGNOSIS — D709 Neutropenia, unspecified: Secondary | ICD-10-CM | POA: Diagnosis not present

## 2021-10-05 DIAGNOSIS — C787 Secondary malignant neoplasm of liver and intrahepatic bile duct: Secondary | ICD-10-CM | POA: Diagnosis not present

## 2021-10-05 DIAGNOSIS — Z5112 Encounter for antineoplastic immunotherapy: Secondary | ICD-10-CM | POA: Diagnosis not present

## 2021-10-05 DIAGNOSIS — Z5111 Encounter for antineoplastic chemotherapy: Secondary | ICD-10-CM | POA: Diagnosis not present

## 2021-10-05 DIAGNOSIS — G629 Polyneuropathy, unspecified: Secondary | ICD-10-CM | POA: Diagnosis not present

## 2021-10-05 DIAGNOSIS — I4891 Unspecified atrial fibrillation: Secondary | ICD-10-CM | POA: Diagnosis not present

## 2021-10-05 MED ORDER — SODIUM CHLORIDE 0.9% FLUSH
10.0000 mL | INTRAVENOUS | Status: DC | PRN
  Administered 2021-10-05: 13:00:00 10 mL

## 2021-10-05 MED ORDER — HEPARIN SOD (PORK) LOCK FLUSH 100 UNIT/ML IV SOLN
500.0000 [IU] | Freq: Once | INTRAVENOUS | Status: AC | PRN
  Administered 2021-10-05: 13:00:00 500 [IU]

## 2021-10-22 ENCOUNTER — Other Ambulatory Visit: Payer: Self-pay | Admitting: Oncology

## 2021-10-24 ENCOUNTER — Other Ambulatory Visit: Payer: Self-pay

## 2021-10-24 ENCOUNTER — Inpatient Hospital Stay (HOSPITAL_BASED_OUTPATIENT_CLINIC_OR_DEPARTMENT_OTHER): Payer: Medicare Other | Admitting: Oncology

## 2021-10-24 ENCOUNTER — Inpatient Hospital Stay: Payer: Medicare Other

## 2021-10-24 ENCOUNTER — Inpatient Hospital Stay: Payer: Medicare Other | Attending: Oncology

## 2021-10-24 VITALS — BP 155/71 | HR 58

## 2021-10-24 VITALS — BP 155/69 | HR 77 | Temp 98.1°F | Resp 20 | Ht 66.0 in | Wt 148.0 lb

## 2021-10-24 DIAGNOSIS — I4891 Unspecified atrial fibrillation: Secondary | ICD-10-CM | POA: Diagnosis not present

## 2021-10-24 DIAGNOSIS — T451X5A Adverse effect of antineoplastic and immunosuppressive drugs, initial encounter: Secondary | ICD-10-CM | POA: Diagnosis not present

## 2021-10-24 DIAGNOSIS — D709 Neutropenia, unspecified: Secondary | ICD-10-CM | POA: Insufficient documentation

## 2021-10-24 DIAGNOSIS — C185 Malignant neoplasm of splenic flexure: Secondary | ICD-10-CM

## 2021-10-24 DIAGNOSIS — R21 Rash and other nonspecific skin eruption: Secondary | ICD-10-CM | POA: Diagnosis not present

## 2021-10-24 DIAGNOSIS — R590 Localized enlarged lymph nodes: Secondary | ICD-10-CM | POA: Diagnosis not present

## 2021-10-24 DIAGNOSIS — Z9221 Personal history of antineoplastic chemotherapy: Secondary | ICD-10-CM | POA: Diagnosis not present

## 2021-10-24 DIAGNOSIS — G620 Drug-induced polyneuropathy: Secondary | ICD-10-CM | POA: Diagnosis not present

## 2021-10-24 DIAGNOSIS — E876 Hypokalemia: Secondary | ICD-10-CM | POA: Diagnosis not present

## 2021-10-24 DIAGNOSIS — R200 Anesthesia of skin: Secondary | ICD-10-CM | POA: Insufficient documentation

## 2021-10-24 DIAGNOSIS — C787 Secondary malignant neoplasm of liver and intrahepatic bile duct: Secondary | ICD-10-CM | POA: Diagnosis not present

## 2021-10-24 LAB — CBC WITH DIFFERENTIAL (CANCER CENTER ONLY)
Abs Immature Granulocytes: 0 10*3/uL (ref 0.00–0.07)
Basophils Absolute: 0 10*3/uL (ref 0.0–0.1)
Basophils Relative: 1 %
Eosinophils Absolute: 0.2 10*3/uL (ref 0.0–0.5)
Eosinophils Relative: 4 %
HCT: 40.2 % (ref 36.0–46.0)
Hemoglobin: 13.2 g/dL (ref 12.0–15.0)
Immature Granulocytes: 0 %
Lymphocytes Relative: 35 %
Lymphs Abs: 1.5 10*3/uL (ref 0.7–4.0)
MCH: 34.8 pg — ABNORMAL HIGH (ref 26.0–34.0)
MCHC: 32.8 g/dL (ref 30.0–36.0)
MCV: 106.1 fL — ABNORMAL HIGH (ref 80.0–100.0)
Monocytes Absolute: 0.4 10*3/uL (ref 0.1–1.0)
Monocytes Relative: 10 %
Neutro Abs: 2.2 10*3/uL (ref 1.7–7.7)
Neutrophils Relative %: 50 %
Platelet Count: 195 10*3/uL (ref 150–400)
RBC: 3.79 MIL/uL — ABNORMAL LOW (ref 3.87–5.11)
RDW: 12.8 % (ref 11.5–15.5)
WBC Count: 4.4 10*3/uL (ref 4.0–10.5)
nRBC: 0 % (ref 0.0–0.2)

## 2021-10-24 LAB — CMP (CANCER CENTER ONLY)
ALT: 17 U/L (ref 0–44)
AST: 22 U/L (ref 15–41)
Albumin: 4 g/dL (ref 3.5–5.0)
Alkaline Phosphatase: 70 U/L (ref 38–126)
Anion gap: 6 (ref 5–15)
BUN: 19 mg/dL (ref 8–23)
CO2: 27 mmol/L (ref 22–32)
Calcium: 9.5 mg/dL (ref 8.9–10.3)
Chloride: 105 mmol/L (ref 98–111)
Creatinine: 0.88 mg/dL (ref 0.44–1.00)
GFR, Estimated: >60 mL/min (ref >60)
Glucose, Bld: 82 mg/dL (ref 70–99)
Potassium: 4.3 mmol/L (ref 3.5–5.1)
Sodium: 138 mmol/L (ref 135–145)
Total Bilirubin: 0.4 mg/dL (ref 0.3–1.2)
Total Protein: 6.4 g/dL — ABNORMAL LOW (ref 6.5–8.1)

## 2021-10-24 LAB — CEA (ACCESS): CEA (CHCC): 1.18 ng/mL (ref 0.00–5.00)

## 2021-10-24 LAB — TOTAL PROTEIN, URINE DIPSTICK: Protein, ur: NEGATIVE mg/dL

## 2021-10-24 LAB — MAGNESIUM: Magnesium: 1.9 mg/dL (ref 1.7–2.4)

## 2021-10-24 MED ORDER — SODIUM CHLORIDE 0.9 % IV SOLN
1600.0000 mg/m2 | INTRAVENOUS | Status: DC
  Administered 2021-10-24: 2650 mg via INTRAVENOUS
  Filled 2021-10-24: qty 53

## 2021-10-24 MED ORDER — SODIUM CHLORIDE 0.9 % IV SOLN
7.5000 mg/kg | Freq: Once | INTRAVENOUS | Status: AC
  Administered 2021-10-24: 500 mg via INTRAVENOUS
  Filled 2021-10-24: qty 4

## 2021-10-24 MED ORDER — SODIUM CHLORIDE 0.9 % IV SOLN: Freq: Once | INTRAVENOUS | Status: AC

## 2021-10-24 NOTE — Progress Notes (Signed)
Patient seen by Dr. Sherrill today ? ?Vitals are within treatment parameters. ? ?Labs reviewed by Dr. Sherrill and are within treatment parameters. ? ?Per physician team, patient is ready for treatment and there are NO modifications to the treatment plan.  ?

## 2021-10-24 NOTE — Patient Instructions (Signed)
[facility]   The chemotherapy medication bag should finish at 46 hours, 96 hours, or 7 days. For example, if your pump is scheduled for 46 hours and it was put on at 4:00 p.m., it should finish at 2:00 p.m. the day it is scheduled to come off regardless of your appointment time.     Estimated time to finish at 11:00, Thursday, October 26, 2021   If the display on your pump reads "Low Volume" and it is beeping, take the batteries out of the pump and come to the cancer center for it to be taken off.   If the pump alarms go off prior to the pump reading "Low Volume" then call [phone] and someone can assist you.  If the plunger comes out and the chemotherapy medication is leaking out, please use your home chemo spill kit to clean up the spill. Do NOT use paper towels or other household products.  If you have problems or questions regarding your pump, please call either [phone] (24 hours a day) or the cancer center Monday-Friday 8:00 a.m.- 4:30 p.m. at the clinic number and we will assist you. If you are unable to get assistance, then go to the nearest Emergency Department and ask the staff to contact the IV team for assistance.  Discharge Instructions: Thank you for choosing [facility] to provide your oncology and hematology care.   If you have a lab appointment with the [facility], please go directly to the [facility] and check in at the registration area.   Wear comfortable clothing and clothing appropriate for easy access to any Portacath or PICC line.   We strive to give you quality time with your provider. You may need to reschedule your appointment if you arrive late (15 or more minutes).  Arriving late affects you and other patients whose appointments are after yours.  Also, if you miss three or more appointments without notifying the office, you may be dismissed from the clinic at the provider's discretion.      For prescription  refill requests, have your pharmacy contact our office and allow 72 hours for refills to be completed.    Today you received the following chemotherapy and/or immunotherapy agents bevacizumab-bvzr, fluorouracil      To help prevent nausea and vomiting after your treatment, we encourage you to take your nausea medication as directed.  BELOW ARE SYMPTOMS THAT SHOULD BE REPORTED IMMEDIATELY: *FEVER GREATER THAN 100.4 F (38 C) OR HIGHER *CHILLS OR SWEATING *NAUSEA AND VOMITING THAT IS NOT CONTROLLED WITH YOUR NAUSEA MEDICATION *UNUSUAL SHORTNESS OF BREATH *UNUSUAL BRUISING OR BLEEDING *URINARY PROBLEMS (pain or burning when urinating, or frequent urination) *BOWEL PROBLEMS (unusual diarrhea, constipation, pain near the anus) TENDERNESS IN MOUTH AND THROAT WITH OR WITHOUT PRESENCE OF ULCERS (sore throat, sores in mouth, or a toothache) UNUSUAL RASH, SWELLING OR PAIN  UNUSUAL VAGINAL DISCHARGE OR ITCHING   Items with * indicate a potential emergency and should be followed up as soon as possible or go to the Emergency Department if any problems should occur.  Please show the CHEMOTHERAPY ALERT CARD or IMMUNOTHERAPY ALERT CARD at check-in to the Emergency Department and triage nurse.  Should you have questions after your visit or need to cancel or reschedule your appointment, please contact [facility]  Dept: [phone]  and follow the prompts.  Office hours are 8:00 a.m. to 4:30 p.m. Monday - Friday. Please note that voicemails left  after 4:00 p.m. may not be returned until the following business day.  We are closed weekends and major holidays. You have access to a nurse at all times for urgent questions. Please call the main number to the clinic Dept: [phone] and follow the prompts.   For any non-urgent questions, you may also contact your provider using MyChart. We now offer e-Visits for anyone 2 and older to request care online for non-urgent symptoms. For  details visit [email or web address].   Also download the MyChart app! Go to the app store, search "MyChart", open the app, select [facility], and log in with your MyChart username and password.  Due to Covid, a mask is required upon entering the hospital/clinic. If you do not have a mask, one will be given to you upon arrival. For doctor visits, patients may have 1 support person aged 63 or older with them. For treatment visits, patients cannot have anyone with them due to current Covid guidelines and our immunocompromised population.   Bevacizumab injection What is this medication? BEVACIZUMAB (be va SIZ yoo mab) is a monoclonal antibody. It is used to treat many types of cancer. This medicine may be used for other purposes; ask your health care provider or pharmacist if you have questions. COMMON BRAND NAME(S): Alymsys, Avastin, MVASI, [name] What should I tell my care team before I take this medication? They need to know if you have any of these conditions: diabetes heart disease high blood pressure history of coughing up blood prior anthracycline chemotherapy (e.g., doxorubicin, daunorubicin, epirubicin) recent or ongoing radiation therapy recent or planning to have surgery stroke an unusual or allergic reaction to bevacizumab, hamster proteins, mouse proteins, other medicines, foods, dyes, or preservatives pregnant or trying to get pregnant breast-feeding How should I use this medication? This medicine is for infusion into a vein. It is given by a health care professional in a hospital or clinic setting. Talk to your pediatrician regarding the use of this medicine in children. Special care may be needed. Overdosage: If you think you have taken too much of this medicine contact a poison control center or emergency room at once. NOTE: This medicine is only for you. Do not share this medicine with others. What if I miss a dose? It is important not to miss your dose. Call your  doctor or health care professional if you are unable to keep an appointment. What may interact with this medication? Interactions are not expected. This list may not describe all possible interactions. Give your health care provider a list of all the medicines, herbs, non-prescription drugs, or dietary supplements you use. Also tell them if you smoke, drink alcohol, or use illegal drugs. Some items may interact with your medicine. What should I watch for while using this medication? Your condition will be monitored carefully while you are receiving this medicine. You will need important blood work and urine testing done while you are taking this medicine. This medicine may increase your risk to bruise or bleed. Call your doctor or health care professional if you notice any unusual bleeding. Before having surgery, talk to your health care provider to make sure it is ok. This drug can increase the risk of poor healing of your surgical site or wound. You will need to stop this drug for 28 days before surgery. After surgery, wait at least 28 days before restarting this drug. Make sure the surgical site or wound is healed enough before restarting this drug. Talk to your health care provider  if questions. Do not become pregnant while taking this medicine or for 6 months after stopping it. Women should inform their doctor if they wish to become pregnant or think they might be pregnant. There is a potential for serious side effects to an unborn child. Talk to your health care professional or pharmacist for more information. Do not breast-feed an infant while taking this medicine and for 6 months after the last dose. This medicine has caused ovarian failure in some women. This medicine may interfere with the ability to have a child. You should talk to your doctor or health care professional if you are concerned about your fertility. What side effects may I notice from receiving this medication? Side effects that  you should report to your doctor or health care professional as soon as possible: allergic reactions like skin rash, itching or hives, swelling of the face, lips, or tongue chest pain or chest tightness chills coughing up blood high fever seizures severe constipation signs and symptoms of bleeding such as bloody or black, tarry stools; red or dark-brown urine; spitting up blood or brown material that looks like coffee grounds; red spots on the skin; unusual bruising or bleeding from the eye, gums, or nose signs and symptoms of a blood clot such as breathing problems; chest pain; severe, sudden headache; pain, swelling, warmth in the leg signs and symptoms of a stroke like changes in vision; confusion; trouble speaking or understanding; severe headaches; sudden numbness or weakness of the face, arm or leg; trouble walking; dizziness; loss of balance or coordination stomach pain sweating swelling of legs or ankles vomiting weight gain Side effects that usually do not require medical attention (report to your doctor or health care professional if they continue or are bothersome): back pain changes in taste decreased appetite dry skin nausea tiredness This list may not describe all possible side effects. Call your doctor for medical advice about side effects. You may report side effects to FDA at 1-800-FDA-1088. Where should I keep my medication? This drug is given in a hospital or clinic and will not be stored at home. NOTE: This sheet is a summary. It may not cover all possible information. If you have questions about this medicine, talk to your doctor, pharmacist, or health care provider.  2022 Elsevier/Gold Standard (2021-07-25 00:00:00)  Fluorouracil, 5-FU injection What is this medication? FLUOROURACIL, 5-FU (flure oh YOOR a sil) is a chemotherapy drug. It slows the growth of cancer cells. This medicine is used to treat many types of cancer like breast cancer, colon or rectal  cancer, pancreatic cancer, and stomach cancer. This medicine may be used for other purposes; ask your health care provider or pharmacist if you have questions. COMMON BRAND NAME(S): Adrucil What should I tell my care team before I take this medication? They need to know if you have any of these conditions: blood disorders dihydropyrimidine dehydrogenase (DPD) deficiency infection (especially a virus infection such as chickenpox, cold sores, or herpes) kidney disease liver disease malnourished, poor nutrition recent or ongoing radiation therapy an unusual or allergic reaction to fluorouracil, other chemotherapy, other medicines, foods, dyes, or preservatives pregnant or trying to get pregnant breast-feeding How should I use this medication? This drug is given as an infusion or injection into a vein. It is administered in a hospital or clinic by a specially trained health care professional. Talk to your pediatrician regarding the use of this medicine in children. Special care may be needed. Overdosage: If you think you have taken  too much of this medicine contact a poison control center or emergency room at once. NOTE: This medicine is only for you. Do not share this medicine with others. What if I miss a dose? It is important not to miss your dose. Call your doctor or health care professional if you are unable to keep an appointment. What may interact with this medication? Do not take this medicine with any of the following medications: live virus vaccines This medicine may also interact with the following medications: medicines that treat or prevent blood clots like warfarin, enoxaparin, and dalteparin This list may not describe all possible interactions. Give your health care provider a list of all the medicines, herbs, non-prescription drugs, or dietary supplements you use. Also tell them if you smoke, drink alcohol, or use illegal drugs. Some items may interact with your medicine. What  should I watch for while using this medication? Visit your doctor for checks on your progress. This drug may make you feel generally unwell. This is not uncommon, as chemotherapy can affect healthy cells as well as cancer cells. Report any side effects. Continue your course of treatment even though you feel ill unless your doctor tells you to stop. In some cases, you may be given additional medicines to help with side effects. Follow all directions for their use. Call your doctor or health care professional for advice if you get a fever, chills or sore throat, or other symptoms of a cold or flu. Do not treat yourself. This drug decreases your body's ability to fight infections. Try to avoid being around people who are sick. This medicine may increase your risk to bruise or bleed. Call your doctor or health care professional if you notice any unusual bleeding. Be careful brushing and flossing your teeth or using a toothpick because you may get an infection or bleed more easily. If you have any dental work done, tell your dentist you are receiving this medicine. Avoid taking products that contain aspirin, acetaminophen, ibuprofen, naproxen, or ketoprofen unless instructed by your doctor. These medicines may hide a fever. Do not become pregnant while taking this medicine. Women should inform their doctor if they wish to become pregnant or think they might be pregnant. There is a potential for serious side effects to an unborn child. Talk to your health care professional or pharmacist for more information. Do not breast-feed an infant while taking this medicine. Men should inform their doctor if they wish to father a child. This medicine may lower sperm counts. Do not treat diarrhea with over the counter products. Contact your doctor if you have diarrhea that lasts more than 2 days or if it is severe and watery. This medicine can make you more sensitive to the sun. Keep out of the sun. If you cannot avoid  being in the sun, wear protective clothing and use sunscreen. Do not use sun lamps or tanning beds/booths. What side effects may I notice from receiving this medication? Side effects that you should report to your doctor or health care professional as soon as possible: allergic reactions like skin rash, itching or hives, swelling of the face, lips, or tongue low blood counts - this medicine may decrease the number of white blood cells, red blood cells and platelets. You may be at increased risk for infections and bleeding. signs of infection - fever or chills, cough, sore throat, pain or difficulty passing urine signs of decreased platelets or bleeding - bruising, pinpoint red spots on the skin, black, tarry  stools, blood in the urine signs of decreased red blood cells - unusually weak or tired, fainting spells, lightheadedness breathing problems changes in vision chest pain mouth sores nausea and vomiting pain, swelling, redness at site where injected pain, tingling, numbness in the hands or feet redness, swelling, or sores on hands or feet stomach pain unusual bleeding Side effects that usually do not require medical attention (report to your doctor or health care professional if they continue or are bothersome): changes in finger or toe nails diarrhea dry or itchy skin hair loss headache loss of appetite sensitivity of eyes to the light stomach upset unusually teary eyes This list may not describe all possible side effects. Call your doctor for medical advice about side effects. You may report side effects to FDA at 1-800-FDA-1088. Where should I keep my medication? This drug is given in a hospital or clinic and will not be stored at home. NOTE: This sheet is a summary. It may not cover all possible information. If you have questions about this medicine, talk to your doctor, pharmacist, or health care provider.  2022 Elsevier/Gold Standard (2021-07-25 00:00:00)

## 2021-10-24 NOTE — Progress Notes (Signed)
Patient presents for treatment. RN assessment completed along with the following:  Labs/vitals reviewed - Yes, and within treatment parameters.   Weight within 10% of previous measurement - Yes Oncology Treatment Attestation completed for current therapy- Yes, on date 06/27/20 Informed consent completed and reflects current therapy/intent - Yes, on date 07/09/21             Provider progress note reviewed - Today's provider note is not yet available. I reviewed the most recent oncology provider progress note in chart dated 10/03/21. Treatment/Antibody/Supportive plan reviewed - Yes, and there are no adjustments needed for today's treatment. S&H and other orders reviewed - Yes, and there are no additional orders identified. Previous treatment date reviewed - Yes, and the appropriate amount of time has elapsed between treatments. Clinic Hand Off Received from - Gean Birchwood, RN  Patient to proceed with treatment.

## 2021-10-24 NOTE — Progress Notes (Signed)
[facility] OFFICE PROGRESS NOTE   Diagnosis: Colon cancer  INTERVAL HISTORY:   Kathy Robinson returns as scheduled she completed another cycle of 5-FU/Avastin on 10/03/2021.  No mouth sores, diarrhea, bleeding, or symptom of thrombosis.  She has developed a "rash "over the face.  She was outside during the day last weekend.  She continues to have peripheral numbness.  Objective:  Vital signs in last 24 hours:  Blood pressure (!) 155/69, pulse 77, temperature 98.1 F (36.7 C), temperature source Oral, resp. rate 20, height $RemoveBe'5\' 6"'XzzMgUkzN$  (1.676 m), weight 148 lb (67.1 kg), last menstrual period 11/19/1996, SpO2 100 %.    HEENT: No thrush or ulcers Resp: Lungs clear bilaterally Cardio: Regular rate and rhythm GI: No hepatosplenomegaly Vascular: No leg edema  Skin: Nonfluent erythema over the cheeks and forehead  Portacath/PICC-without erythema  Lab Results:  Lab Results  Component Value Date   WBC 4.4 10/24/2021   HGB 13.2 10/24/2021   HCT 40.2 10/24/2021   MCV 106.1 (H) 10/24/2021   PLT 195 10/24/2021   NEUTROABS 2.2 10/24/2021    CMP  Lab Results  Component Value Date   NA 138 10/03/2021   K 4.6 10/03/2021   CL 104 10/03/2021   CO2 27 10/03/2021   GLUCOSE 86 10/03/2021   BUN 16 10/03/2021   CREATININE 0.82 10/03/2021   CALCIUM 9.3 10/03/2021   PROT 7.2 10/03/2021   ALBUMIN 4.1 10/03/2021   AST 22 10/03/2021   ALT 18 10/03/2021   ALKPHOS 65 10/03/2021   BILITOT 0.5 10/03/2021   GFRNONAA >60 10/03/2021   GFRAA >60 08/16/2020    Lab Results  Component Value Date   CEA1 1.16 03/29/2021   CEA 1.54 10/03/2021    Medications: I have reviewed the patient's current medications.   Assessment/Plan: Adenocarcinoma the left colon, stage IIIc (pT4b,pN2a), status post a left colectomy 05/30/2020, MSS, TMB 13, BRAF V600E Tumor invades the visceral peritoneum, lymphovascular and perineural invasion present, for tumor deposits, 7/13 lymph nodes, negative  resection margins, no loss of mismatch repair protein expression CT abdomen/pelvis 05/26/2020-long segment of masslike thickening of the descending colon with associated high-grade colonic obstruction, small colonic wall defect with evidence of a focally contained microperforation, retroperitoneal adenopathy, indeterminate small hypodense liver lesions Elevated preoperative CEA PET 06/22/2020-hypermetabolic liver metastases, periportal and periaortic adenopathy, hypermetabolic mediastinal and left supraclavicular nodes Cycle 1 FOLFOX 07/05/20 Cycle 2 FOLFOXIRI (dose-reduced 5FU, no bolus) and Avastin 07/19/20  Cycle 3 FOLFOXIRI (Dose reduced 5-FU, no bolus)/Avastin 08/02/2020 Cycle 4 FOLFOXIRI/Avastin 08/16/2020 (Udenyca added) Cycle 5 FOLFOXIRI/Avastin 08/30/2020 CTs 09/09/2020-diminished size of lymph nodes in the chest, abdomen, and pelvis.  Decreased hepatic metastases.  No new evidence of disease progression, fat density lesion surrounding the left hemicolectomy Cycle 6 FOLFOXIRI/Avastin 09/13/2020 Cycle 7 FOLFOX/Avastin 09/27/2020 (irinotecan held) Cycle 8 FOLFOXIRI/Avastin 10/18/2020  Cycle 9 FOLFOXIRI/Avastin 11/01/2020 Cycle 10 FOLFOXIRI/Avastin 11/22/2020 (oxaliplatin held, irinotecan and 5-FU dose reduced) Cycle 11 FOLFOXIRI/Avastin 12/12/2020 (oxaliplatin held) CTs 12/19/2020-decrease in size of liver metastases, no evidence of disease progression Cycle 12 FOLFOXIRI/Avastin 01/04/2021 (oxaliplatin held) Cycle 13 FOLFOXIRI/Avastin 01/24/2021 (oxaliplatin held) Cycle 14 FOLFOXIRI/Avastin 02/14/2021 (oxaliplatin held) Cycle 15 FOLFOXIRI/Avastin 03/07/2021 (oxaliplatin held) Cycle 16 FOLFOXIRI/Avastin 03/29/2021 (oxaliplatin held) CTs 04/14/2021- decreased size of liver metastases.  No new or progressive findings. Cycle 17 FOLFOXIRI/Avastin 04/19/2021 (oxaliplatin held) Cycle 18 FOLFOXIRI/Avastin 05/09/2021 (oxaliplatin held) Cycle 19 FOLFOXIRI/Avastin 05/30/2021 (oxaliplatin held) Cycle 20  FOLFOXIRI/Avastin 06/20/2021 (oxaliplatin held) Cycle 21 FOLFOXIRI/Avastin 07/11/2021 (oxaliplatin held) CTs 07/28/2021-unchanged subcentimeter liver lesions, no evidence of  new metastatic disease Cycle 22 5-FU/ Avastin 08/01/2021 Cycle 23 5-FU/Avastin 08/22/2021 Cycle 24 5-FU/Avastin 09/12/2021 Cycle 25 5-FU/Avastin 10/03/2021 Cycle 26 5-FU/Avastin 10/24/2021   Atrial fibrillation with rapid ventricular response 05/26/2020 Mild neutropenia following cycle 3 FOLFOXIRI, Udenyca added with cycle 4 Hypokalemia secondary to diarrhea-potassium supplementation starting 09/13/2020 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  She will complete another cycle today.  The face rash is likely related to sun exposure while on 5-FU.  She will return for an office visit and treatment in 3 weeks.  We will plan for a restaging CT evaluation in January or February.  [name], MD  10/24/2021  11:25 AM

## 2021-10-25 ENCOUNTER — Inpatient Hospital Stay: Payer: Medicare Other | Admitting: Nurse Practitioner

## 2021-10-25 ENCOUNTER — Inpatient Hospital Stay: Payer: Medicare Other

## 2021-10-25 ENCOUNTER — Telehealth: Payer: Self-pay

## 2021-10-25 NOTE — Telephone Encounter (Signed)
Pt phoned in today stating she was in the bathroom  and the pump dropped on the floor. Pt states the pump turned off and is not working Pt was told to come in to check to see if pump was ok. After checking to see if huber needle was in the correct place and dressing still intact pump was turned back on. After waiting a few minutes to see if pump would work ok pump beeped and relayed message "High Pressure " checking the line to see if anything was disconnected noticed a crack in the tubing. Pump was disconnected. V/S taken Port flushed and needle removed and intact.Pt stable ambulatory with cane when leaving the clinic.

## 2021-11-13 ENCOUNTER — Other Ambulatory Visit: Payer: Self-pay | Admitting: Oncology

## 2021-11-15 ENCOUNTER — Inpatient Hospital Stay: Payer: Medicare Other

## 2021-11-15 ENCOUNTER — Other Ambulatory Visit: Payer: Self-pay

## 2021-11-15 ENCOUNTER — Inpatient Hospital Stay (HOSPITAL_BASED_OUTPATIENT_CLINIC_OR_DEPARTMENT_OTHER): Payer: Medicare Other | Admitting: Oncology

## 2021-11-15 ENCOUNTER — Other Ambulatory Visit (HOSPITAL_BASED_OUTPATIENT_CLINIC_OR_DEPARTMENT_OTHER): Payer: Self-pay

## 2021-11-15 ENCOUNTER — Other Ambulatory Visit (HOSPITAL_COMMUNITY): Payer: Self-pay

## 2021-11-15 VITALS — BP 156/74 | HR 72 | Temp 98.1°F | Resp 20 | Ht 66.0 in | Wt 150.6 lb

## 2021-11-15 DIAGNOSIS — C185 Malignant neoplasm of splenic flexure: Secondary | ICD-10-CM

## 2021-11-15 DIAGNOSIS — G620 Drug-induced polyneuropathy: Secondary | ICD-10-CM | POA: Diagnosis not present

## 2021-11-15 DIAGNOSIS — D709 Neutropenia, unspecified: Secondary | ICD-10-CM | POA: Diagnosis not present

## 2021-11-15 DIAGNOSIS — Z9221 Personal history of antineoplastic chemotherapy: Secondary | ICD-10-CM | POA: Diagnosis not present

## 2021-11-15 DIAGNOSIS — I4891 Unspecified atrial fibrillation: Secondary | ICD-10-CM | POA: Diagnosis not present

## 2021-11-15 DIAGNOSIS — T451X5A Adverse effect of antineoplastic and immunosuppressive drugs, initial encounter: Secondary | ICD-10-CM | POA: Diagnosis not present

## 2021-11-15 DIAGNOSIS — R590 Localized enlarged lymph nodes: Secondary | ICD-10-CM | POA: Diagnosis not present

## 2021-11-15 DIAGNOSIS — C787 Secondary malignant neoplasm of liver and intrahepatic bile duct: Secondary | ICD-10-CM | POA: Diagnosis not present

## 2021-11-15 DIAGNOSIS — E876 Hypokalemia: Secondary | ICD-10-CM | POA: Diagnosis not present

## 2021-11-15 DIAGNOSIS — R200 Anesthesia of skin: Secondary | ICD-10-CM | POA: Diagnosis not present

## 2021-11-15 DIAGNOSIS — R21 Rash and other nonspecific skin eruption: Secondary | ICD-10-CM | POA: Diagnosis not present

## 2021-11-15 LAB — CMP (CANCER CENTER ONLY)
ALT: 15 U/L (ref 0–44)
AST: 20 U/L (ref 15–41)
Albumin: 3.7 g/dL (ref 3.5–5.0)
Alkaline Phosphatase: 57 U/L (ref 38–126)
Anion gap: 5 (ref 5–15)
BUN: 23 mg/dL (ref 8–23)
CO2: 27 mmol/L (ref 22–32)
Calcium: 9 mg/dL (ref 8.9–10.3)
Chloride: 106 mmol/L (ref 98–111)
Creatinine: 0.93 mg/dL (ref 0.44–1.00)
GFR, Estimated: >60 mL/min (ref >60)
Glucose, Bld: 77 mg/dL (ref 70–99)
Potassium: 4.5 mmol/L (ref 3.5–5.1)
Sodium: 138 mmol/L (ref 135–145)
Total Bilirubin: 0.5 mg/dL (ref 0.3–1.2)
Total Protein: 6.3 g/dL — ABNORMAL LOW (ref 6.5–8.1)

## 2021-11-15 LAB — CBC WITH DIFFERENTIAL (CANCER CENTER ONLY)
Abs Immature Granulocytes: 0.01 10*3/uL (ref 0.00–0.07)
Basophils Absolute: 0 10*3/uL (ref 0.0–0.1)
Basophils Relative: 1 %
Eosinophils Absolute: 0.3 10*3/uL (ref 0.0–0.5)
Eosinophils Relative: 7 %
HCT: 39.6 % (ref 36.0–46.0)
Hemoglobin: 13.2 g/dL (ref 12.0–15.0)
Immature Granulocytes: 0 %
Lymphocytes Relative: 36 %
Lymphs Abs: 1.5 10*3/uL (ref 0.7–4.0)
MCH: 35 pg — ABNORMAL HIGH (ref 26.0–34.0)
MCHC: 33.3 g/dL (ref 30.0–36.0)
MCV: 105 fL — ABNORMAL HIGH (ref 80.0–100.0)
Monocytes Absolute: 0.4 10*3/uL (ref 0.1–1.0)
Monocytes Relative: 9 %
Neutro Abs: 2 10*3/uL (ref 1.7–7.7)
Neutrophils Relative %: 47 %
Platelet Count: 208 10*3/uL (ref 150–400)
RBC: 3.77 MIL/uL — ABNORMAL LOW (ref 3.87–5.11)
RDW: 13.1 % (ref 11.5–15.5)
WBC Count: 4.3 10*3/uL (ref 4.0–10.5)
nRBC: 0 % (ref 0.0–0.2)

## 2021-11-15 LAB — CEA (ACCESS): CEA (CHCC): 1.73 ng/mL (ref 0.00–5.00)

## 2021-11-15 LAB — TOTAL PROTEIN, URINE DIPSTICK: Protein, ur: NEGATIVE mg/dL

## 2021-11-15 MED ORDER — SODIUM CHLORIDE 0.9 % IV SOLN
1600.0000 mg/m2 | INTRAVENOUS | Status: DC
  Administered 2021-11-15: 14:00:00 2650 mg via INTRAVENOUS
  Filled 2021-11-15: qty 50

## 2021-11-15 MED ORDER — SODIUM CHLORIDE 0.9 % IV SOLN: Freq: Once | INTRAVENOUS | Status: AC

## 2021-11-15 MED ORDER — SODIUM CHLORIDE 0.9 % IV SOLN
7.5000 mg/kg | Freq: Once | INTRAVENOUS | Status: AC
  Administered 2021-11-15: 14:00:00 500 mg via INTRAVENOUS
  Filled 2021-11-15: qty 16

## 2021-11-15 MED ORDER — LIDOCAINE-PRILOCAINE 2.5-2.5 % EX CREA
1.0000 | TOPICAL_CREAM | CUTANEOUS | 0 refills | Status: DC | PRN
Start: 2021-11-15 — End: 2023-12-26
  Filled 2021-11-15: qty 30, 10d supply, fill #0

## 2021-11-15 NOTE — Progress Notes (Signed)
Infusion encounter documented under phlebotomy encounter.

## 2021-11-15 NOTE — Patient Instructions (Signed)
[facility]  Discharge Instructions: Thank you for choosing [facility] to provide your oncology and hematology care.   If you have a lab appointment with the [facility], please go directly to the [facility] and check in at the registration area.   Wear comfortable clothing and clothing appropriate for easy access to any Portacath or PICC line.   We strive to give you quality time with your provider. You may need to reschedule your appointment if you arrive late (15 or more minutes).  Arriving late affects you and other patients whose appointments are after yours.  Also, if you miss three or more appointments without notifying the office, you may be dismissed from the clinic at the providers discretion.      For prescription refill requests, have your pharmacy contact our office and allow 72 hours for refills to be completed.    Today you received the following chemotherapy and/or immunotherapy agents: Avastin/5FU  Bevacizumab injection What is this medication? BEVACIZUMAB (be va SIZ yoo mab) is a monoclonal antibody. It is used to treat many types of cancer. This medicine may be used for other purposes; ask your health care provider or pharmacist if you have questions. COMMON BRAND NAME(S): Alymsys, Avastin, MVASI, [name] What should I tell my care team before I take this medication? They need to know if you have any of these conditions: diabetes heart disease high blood pressure history of coughing up blood prior anthracycline chemotherapy (e.g., doxorubicin, daunorubicin, epirubicin) recent or ongoing radiation therapy recent or planning to have surgery stroke an unusual or allergic reaction to bevacizumab, hamster proteins, mouse proteins, other medicines, foods, dyes, or preservatives pregnant or trying to get pregnant breast-feeding How should I use this medication? This medicine is for infusion into a vein. It is given by a  health care professional in a hospital or clinic setting. Talk to your pediatrician regarding the use of this medicine in children. Special care may be needed. Overdosage: If you think you have taken too much of this medicine contact a poison control center or emergency room at once. NOTE: This medicine is only for you. Do not share this medicine with others. What if I miss a dose? It is important not to miss your dose. Call your doctor or health care professional if you are unable to keep an appointment. What may interact with this medication? Interactions are not expected. This list may not describe all possible interactions. Give your health care provider a list of all the medicines, herbs, non-prescription drugs, or dietary supplements you use. Also tell them if you smoke, drink alcohol, or use illegal drugs. Some items may interact with your medicine. What should I watch for while using this medication? Your condition will be monitored carefully while you are receiving this medicine. You will need important blood work and urine testing done while you are taking this medicine. This medicine may increase your risk to bruise or bleed. Call your doctor or health care professional if you notice any unusual bleeding. Before having surgery, talk to your health care provider to make sure it is ok. This drug can increase the risk of poor healing of your surgical site or wound. You will need to stop this drug for 28 days before surgery. After surgery, wait at least 28 days before restarting this drug. Make sure the surgical site or wound is healed enough before restarting this drug. Talk to your health care provider if questions. Do  not become pregnant while taking this medicine or for 6 months after stopping it. Women should inform their doctor if they wish to become pregnant or think they might be pregnant. There is a potential for serious side effects to an unborn child. Talk to your health care  professional or pharmacist for more information. Do not breast-feed an infant while taking this medicine and for 6 months after the last dose. This medicine has caused ovarian failure in some women. This medicine may interfere with the ability to have a child. You should talk to your doctor or health care professional if you are concerned about your fertility. What side effects may I notice from receiving this medication? Side effects that you should report to your doctor or health care professional as soon as possible: allergic reactions like skin rash, itching or hives, swelling of the face, lips, or tongue chest pain or chest tightness chills coughing up blood high fever seizures severe constipation signs and symptoms of bleeding such as bloody or black, tarry stools; red or dark-brown urine; spitting up blood or brown material that looks like coffee grounds; red spots on the skin; unusual bruising or bleeding from the eye, gums, or nose signs and symptoms of a blood clot such as breathing problems; chest pain; severe, sudden headache; pain, swelling, warmth in the leg signs and symptoms of a stroke like changes in vision; confusion; trouble speaking or understanding; severe headaches; sudden numbness or weakness of the face, arm or leg; trouble walking; dizziness; loss of balance or coordination stomach pain sweating swelling of legs or ankles vomiting weight gain Side effects that usually do not require medical attention (report to your doctor or health care professional if they continue or are bothersome): back pain changes in taste decreased appetite dry skin nausea tiredness This list may not describe all possible side effects. Call your doctor for medical advice about side effects. You may report side effects to FDA at 1-800-FDA-1088. Where should I keep my medication? This drug is given in a hospital or clinic and will not be stored at home. NOTE: This sheet is a summary. It  may not cover all possible information. If you have questions about this medicine, talk to your doctor, pharmacist, or health care provider.  2022 Elsevier/Gold Standard (2021-07-25 00:00:00)  Fluorouracil, 5-FU injection What is this medication? FLUOROURACIL, 5-FU (flure oh YOOR a sil) is a chemotherapy drug. It slows the growth of cancer cells. This medicine is used to treat many types of cancer like breast cancer, colon or rectal cancer, pancreatic cancer, and stomach cancer. This medicine may be used for other purposes; ask your health care provider or pharmacist if you have questions. COMMON BRAND NAME(S): Adrucil What should I tell my care team before I take this medication? They need to know if you have any of these conditions: blood disorders dihydropyrimidine dehydrogenase (DPD) deficiency infection (especially a virus infection such as chickenpox, cold sores, or herpes) kidney disease liver disease malnourished, poor nutrition recent or ongoing radiation therapy an unusual or allergic reaction to fluorouracil, other chemotherapy, other medicines, foods, dyes, or preservatives pregnant or trying to get pregnant breast-feeding How should I use this medication? This drug is given as an infusion or injection into a vein. It is administered in a hospital or clinic by a specially trained health care professional. Talk to your pediatrician regarding the use of this medicine in children. Special care may be needed. Overdosage: If you think you have taken too much of  this medicine contact a poison control center or emergency room at once. NOTE: This medicine is only for you. Do not share this medicine with others. What if I miss a dose? It is important not to miss your dose. Call your doctor or health care professional if you are unable to keep an appointment. What may interact with this medication? Do not take this medicine with any of the following medications: live virus  vaccines This medicine may also interact with the following medications: medicines that treat or prevent blood clots like warfarin, enoxaparin, and dalteparin This list may not describe all possible interactions. Give your health care provider a list of all the medicines, herbs, non-prescription drugs, or dietary supplements you use. Also tell them if you smoke, drink alcohol, or use illegal drugs. Some items may interact with your medicine. What should I watch for while using this medication? Visit your doctor for checks on your progress. This drug may make you feel generally unwell. This is not uncommon, as chemotherapy can affect healthy cells as well as cancer cells. Report any side effects. Continue your course of treatment even though you feel ill unless your doctor tells you to stop. In some cases, you may be given additional medicines to help with side effects. Follow all directions for their use. Call your doctor or health care professional for advice if you get a fever, chills or sore throat, or other symptoms of a cold or flu. Do not treat yourself. This drug decreases your body's ability to fight infections. Try to avoid being around people who are sick. This medicine may increase your risk to bruise or bleed. Call your doctor or health care professional if you notice any unusual bleeding. Be careful brushing and flossing your teeth or using a toothpick because you may get an infection or bleed more easily. If you have any dental work done, tell your dentist you are receiving this medicine. Avoid taking products that contain aspirin, acetaminophen, ibuprofen, naproxen, or ketoprofen unless instructed by your doctor. These medicines may hide a fever. Do not become pregnant while taking this medicine. Women should inform their doctor if they wish to become pregnant or think they might be pregnant. There is a potential for serious side effects to an unborn child. Talk to your health care  professional or pharmacist for more information. Do not breast-feed an infant while taking this medicine. Men should inform their doctor if they wish to father a child. This medicine may lower sperm counts. Do not treat diarrhea with over the counter products. Contact your doctor if you have diarrhea that lasts more than 2 days or if it is severe and watery. This medicine can make you more sensitive to the sun. Keep out of the sun. If you cannot avoid being in the sun, wear protective clothing and use sunscreen. Do not use sun lamps or tanning beds/booths. What side effects may I notice from receiving this medication? Side effects that you should report to your doctor or health care professional as soon as possible: allergic reactions like skin rash, itching or hives, swelling of the face, lips, or tongue low blood counts - this medicine may decrease the number of white blood cells, red blood cells and platelets. You may be at increased risk for infections and bleeding. signs of infection - fever or chills, cough, sore throat, pain or difficulty passing urine signs of decreased platelets or bleeding - bruising, pinpoint red spots on the skin, black, tarry stools, blood in  the urine signs of decreased red blood cells - unusually weak or tired, fainting spells, lightheadedness breathing problems changes in vision chest pain mouth sores nausea and vomiting pain, swelling, redness at site where injected pain, tingling, numbness in the hands or feet redness, swelling, or sores on hands or feet stomach pain unusual bleeding Side effects that usually do not require medical attention (report to your doctor or health care professional if they continue or are bothersome): changes in finger or toe nails diarrhea dry or itchy skin hair loss headache loss of appetite sensitivity of eyes to the light stomach upset unusually teary eyes This list may not describe all possible side effects. Call your  doctor for medical advice about side effects. You may report side effects to FDA at 1-800-FDA-1088. Where should I keep my medication? This drug is given in a hospital or clinic and will not be stored at home. NOTE: This sheet is a summary. It may not cover all possible information. If you have questions about this medicine, talk to your doctor, pharmacist, or health care provider.  2022 Elsevier/Gold Standard (2021-07-25 00:00:00)        To help prevent nausea and vomiting after your treatment, we encourage you to take your nausea medication as directed.  BELOW ARE SYMPTOMS THAT SHOULD BE REPORTED IMMEDIATELY: *FEVER GREATER THAN 100.4 F (38 C) OR HIGHER *CHILLS OR SWEATING *NAUSEA AND VOMITING THAT IS NOT CONTROLLED WITH YOUR NAUSEA MEDICATION *UNUSUAL SHORTNESS OF BREATH *UNUSUAL BRUISING OR BLEEDING *URINARY PROBLEMS (pain or burning when urinating, or frequent urination) *BOWEL PROBLEMS (unusual diarrhea, constipation, pain near the anus) TENDERNESS IN MOUTH AND THROAT WITH OR WITHOUT PRESENCE OF ULCERS (sore throat, sores in mouth, or a toothache) UNUSUAL RASH, SWELLING OR PAIN  UNUSUAL VAGINAL DISCHARGE OR ITCHING   Items with * indicate a potential emergency and should be followed up as soon as possible or go to the Emergency Department if any problems should occur.  Please show the CHEMOTHERAPY ALERT CARD or IMMUNOTHERAPY ALERT CARD at check-in to the Emergency Department and triage nurse.  Should you have questions after your visit or need to cancel or reschedule your appointment, please contact [facility]  Dept: [phone]  and follow the prompts.  Office hours are 8:00 a.m. to 4:30 p.m. Monday - Friday. Please note that voicemails left after 4:00 p.m. may not be returned until the following business day.  We are closed weekends and major holidays. You have access to a nurse at all times for urgent questions. Please call the main number to the  clinic Dept: [phone] and follow the prompts.   For any non-urgent questions, you may also contact your provider using MyChart. We now offer e-Visits for anyone 65 and older to request care online for non-urgent symptoms. For details visit [email or web address].   Also download the MyChart app! Go to the app store, search "MyChart", open the app, select [facility], and log in with your MyChart username and password.  Due to Covid, a mask is required upon entering the hospital/clinic. If you do not have a mask, one will be given to you upon arrival. For doctor visits, patients may have 1 support person aged 18 or older with them. For treatment visits, patients cannot have anyone with them due to current Covid guidelines and our immunocompromised population.

## 2021-11-15 NOTE — Progress Notes (Signed)
[facility] OFFICE PROGRESS NOTE   Diagnosis: Colon cancer  INTERVAL HISTORY:   Kathy Robinson completed another cycle of 5-FU and Avastin on 10/24/2021.  No mouth sores, diarrhea, bleeding, or symptom of thrombosis.  No new complaint.  She has persistent numbness in the feet.  Objective:  Vital signs in last 24 hours:  Blood pressure (!) 156/74, pulse 72, temperature 98.1 F (36.7 C), temperature source Oral, resp. rate 20, height $RemoveBe'5\' 6"'BLVIlhsQY$  (1.676 m), weight 150 lb 9.6 oz (68.3 kg), last menstrual period 11/19/1996, SpO2 100 %.    HEENT: No thrush or ulcers Resp: Lungs clear bilaterally Cardio: Regular rate and rhythm GI: No hepatosplenomegaly, no mass, nontender Vascular: No leg edema     Portacath/PICC-without erythema  Lab Results:  Lab Results  Component Value Date   WBC 4.3 11/15/2021   HGB 13.2 11/15/2021   HCT 39.6 11/15/2021   MCV 105.0 (H) 11/15/2021   PLT 208 11/15/2021   NEUTROABS 2.0 11/15/2021    CMP  Lab Results  Component Value Date   NA 138 10/24/2021   K 4.3 10/24/2021   CL 105 10/24/2021   CO2 27 10/24/2021   GLUCOSE 82 10/24/2021   BUN 19 10/24/2021   CREATININE 0.88 10/24/2021   CALCIUM 9.5 10/24/2021   PROT 6.4 (L) 10/24/2021   ALBUMIN 4.0 10/24/2021   AST 22 10/24/2021   ALT 17 10/24/2021   ALKPHOS 70 10/24/2021   BILITOT 0.4 10/24/2021   GFRNONAA >60 10/24/2021   GFRAA >60 08/16/2020    Lab Results  Component Value Date   CEA1 1.16 03/29/2021   CEA 1.73 11/15/2021   Medications: I have reviewed the patient's current medications.   Assessment/Plan: Adenocarcinoma the left colon, stage IIIc (pT4b,pN2a), status post a left colectomy 05/30/2020, MSS, TMB 13, BRAF V600E Tumor invades the visceral peritoneum, lymphovascular and perineural invasion present, for tumor deposits, 7/13 lymph nodes, negative resection margins, no loss of mismatch repair protein expression CT abdomen/pelvis 05/26/2020-long segment of masslike  thickening of the descending colon with associated high-grade colonic obstruction, small colonic wall defect with evidence of a focally contained microperforation, retroperitoneal adenopathy, indeterminate small hypodense liver lesions Elevated preoperative CEA PET 06/22/2020-hypermetabolic liver metastases, periportal and periaortic adenopathy, hypermetabolic mediastinal and left supraclavicular nodes Cycle 1 FOLFOX 07/05/20 Cycle 2 FOLFOXIRI (dose-reduced 5FU, no bolus) and Avastin 07/19/20  Cycle 3 FOLFOXIRI (Dose reduced 5-FU, no bolus)/Avastin 08/02/2020 Cycle 4 FOLFOXIRI/Avastin 08/16/2020 (Udenyca added) Cycle 5 FOLFOXIRI/Avastin 08/30/2020 CTs 09/09/2020-diminished size of lymph nodes in the chest, abdomen, and pelvis.  Decreased hepatic metastases.  No new evidence of disease progression, fat density lesion surrounding the left hemicolectomy Cycle 6 FOLFOXIRI/Avastin 09/13/2020 Cycle 7 FOLFOX/Avastin 09/27/2020 (irinotecan held) Cycle 8 FOLFOXIRI/Avastin 10/18/2020  Cycle 9 FOLFOXIRI/Avastin 11/01/2020 Cycle 10 FOLFOXIRI/Avastin 11/22/2020 (oxaliplatin held, irinotecan and 5-FU dose reduced) Cycle 11 FOLFOXIRI/Avastin 12/12/2020 (oxaliplatin held) CTs 12/19/2020-decrease in size of liver metastases, no evidence of disease progression Cycle 12 FOLFOXIRI/Avastin 01/04/2021 (oxaliplatin held) Cycle 13 FOLFOXIRI/Avastin 01/24/2021 (oxaliplatin held) Cycle 14 FOLFOXIRI/Avastin 02/14/2021 (oxaliplatin held) Cycle 15 FOLFOXIRI/Avastin 03/07/2021 (oxaliplatin held) Cycle 16 FOLFOXIRI/Avastin 03/29/2021 (oxaliplatin held) CTs 04/14/2021- decreased size of liver metastases.  No new or progressive findings. Cycle 17 FOLFOXIRI/Avastin 04/19/2021 (oxaliplatin held) Cycle 18 FOLFOXIRI/Avastin 05/09/2021 (oxaliplatin held) Cycle 19 FOLFOXIRI/Avastin 05/30/2021 (oxaliplatin held) Cycle 20 FOLFOXIRI/Avastin 06/20/2021 (oxaliplatin held) Cycle 21 FOLFOXIRI/Avastin 07/11/2021 (oxaliplatin held) CTs 07/28/2021-unchanged  subcentimeter liver lesions, no evidence of new metastatic disease Cycle 22 5-FU/ Avastin 08/01/2021 Cycle 23 5-FU/Avastin 08/22/2021 Cycle 24 5-FU/Avastin 09/12/2021  Cycle 25 5-FU/Avastin 10/03/2021 Cycle 26 5-FU/Avastin 10/24/2021 Cycle 27 5-FU/Avastin 11/15/2021   Atrial fibrillation with rapid ventricular response 05/26/2020 Mild neutropenia following cycle 3 FOLFOXIRI, Udenyca added with cycle 4 Hypokalemia secondary to diarrhea-potassium supplementation starting 09/13/2020 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 Heatwole appears stable.  She is tolerating the 5-FU and Avastin well.  She will complete another cycle today.  She will return for an office visit and chemotherapy in 3 weeks.  She will be scheduled for restaging CTs prior to treatment in February.  [name], MD  11/15/2021  12:47 PM

## 2021-11-15 NOTE — Progress Notes (Signed)
Patient presents for treatment. RN assessment completed along with the following:  Labs/vitals reviewed - Yes, and within treatment parameters.   Weight within 10% of previous measurement - Yes Oncology Treatment Attestation completed for current therapy- Yes, on date 06/27/2020 Informed consent completed and reflects current therapy/intent - Yes, on date 07/19/2020             Provider progress note reviewed - Yes, today's provider note reviewed.  Treatment/Antibody/Supportive plan reviewed - Yes, and there are no adjustments needed for today's treatment. S&H and other orders reviewed - Yes, and there are no additional orders identified. Previous treatment date reviewed - Yes, and the appropriate amount of time has elapsed between treatments. Clinic Hand Off Received from - Lenox Ponds LPN  Patient to proceed with treatment.

## 2021-11-17 ENCOUNTER — Inpatient Hospital Stay: Payer: Medicare Other

## 2021-11-17 ENCOUNTER — Other Ambulatory Visit: Payer: Self-pay

## 2021-11-17 VITALS — BP 131/81 | HR 81 | Temp 97.8°F | Resp 18

## 2021-11-17 DIAGNOSIS — R21 Rash and other nonspecific skin eruption: Secondary | ICD-10-CM | POA: Diagnosis not present

## 2021-11-17 DIAGNOSIS — C185 Malignant neoplasm of splenic flexure: Secondary | ICD-10-CM

## 2021-11-17 DIAGNOSIS — T451X5A Adverse effect of antineoplastic and immunosuppressive drugs, initial encounter: Secondary | ICD-10-CM | POA: Diagnosis not present

## 2021-11-17 DIAGNOSIS — C787 Secondary malignant neoplasm of liver and intrahepatic bile duct: Secondary | ICD-10-CM | POA: Diagnosis not present

## 2021-11-17 DIAGNOSIS — G620 Drug-induced polyneuropathy: Secondary | ICD-10-CM | POA: Diagnosis not present

## 2021-11-17 DIAGNOSIS — D709 Neutropenia, unspecified: Secondary | ICD-10-CM | POA: Diagnosis not present

## 2021-11-17 DIAGNOSIS — R590 Localized enlarged lymph nodes: Secondary | ICD-10-CM | POA: Diagnosis not present

## 2021-11-17 DIAGNOSIS — Z9221 Personal history of antineoplastic chemotherapy: Secondary | ICD-10-CM | POA: Diagnosis not present

## 2021-11-17 DIAGNOSIS — E876 Hypokalemia: Secondary | ICD-10-CM | POA: Diagnosis not present

## 2021-11-17 DIAGNOSIS — R200 Anesthesia of skin: Secondary | ICD-10-CM | POA: Diagnosis not present

## 2021-11-17 DIAGNOSIS — I4891 Unspecified atrial fibrillation: Secondary | ICD-10-CM | POA: Diagnosis not present

## 2021-11-17 MED ORDER — HEPARIN SOD (PORK) LOCK FLUSH 100 UNIT/ML IV SOLN
500.0000 [IU] | Freq: Once | INTRAVENOUS | Status: AC | PRN
  Administered 2021-11-17: 14:00:00 500 [IU]

## 2021-11-17 MED ORDER — SODIUM CHLORIDE 0.9% FLUSH
10.0000 mL | INTRAVENOUS | Status: DC | PRN
  Administered 2021-11-17: 14:00:00 10 mL

## 2021-11-17 NOTE — Patient Instructions (Signed)

## 2021-12-03 ENCOUNTER — Other Ambulatory Visit: Payer: Self-pay | Admitting: Oncology

## 2021-12-05 ENCOUNTER — Inpatient Hospital Stay: Payer: Medicare Other

## 2021-12-05 ENCOUNTER — Other Ambulatory Visit: Payer: Self-pay

## 2021-12-05 ENCOUNTER — Encounter: Payer: Self-pay | Admitting: *Deleted

## 2021-12-05 ENCOUNTER — Inpatient Hospital Stay: Payer: Medicare Other | Attending: Oncology

## 2021-12-05 ENCOUNTER — Inpatient Hospital Stay: Payer: Medicare Other | Admitting: Nurse Practitioner

## 2021-12-05 ENCOUNTER — Encounter: Payer: Self-pay | Admitting: Oncology

## 2021-12-05 VITALS — BP 147/80 | HR 80 | Temp 98.1°F | Resp 20 | Ht 66.0 in | Wt 152.2 lb

## 2021-12-05 VITALS — BP 148/70 | HR 65 | Temp 98.0°F | Resp 20

## 2021-12-05 DIAGNOSIS — Z5112 Encounter for antineoplastic immunotherapy: Secondary | ICD-10-CM | POA: Insufficient documentation

## 2021-12-05 DIAGNOSIS — G620 Drug-induced polyneuropathy: Secondary | ICD-10-CM | POA: Diagnosis not present

## 2021-12-05 DIAGNOSIS — C185 Malignant neoplasm of splenic flexure: Secondary | ICD-10-CM

## 2021-12-05 DIAGNOSIS — Z79899 Other long term (current) drug therapy: Secondary | ICD-10-CM | POA: Insufficient documentation

## 2021-12-05 DIAGNOSIS — C186 Malignant neoplasm of descending colon: Secondary | ICD-10-CM | POA: Insufficient documentation

## 2021-12-05 DIAGNOSIS — E876 Hypokalemia: Secondary | ICD-10-CM | POA: Diagnosis not present

## 2021-12-05 DIAGNOSIS — I4891 Unspecified atrial fibrillation: Secondary | ICD-10-CM | POA: Insufficient documentation

## 2021-12-05 DIAGNOSIS — D709 Neutropenia, unspecified: Secondary | ICD-10-CM | POA: Diagnosis not present

## 2021-12-05 DIAGNOSIS — C787 Secondary malignant neoplasm of liver and intrahepatic bile duct: Secondary | ICD-10-CM | POA: Diagnosis not present

## 2021-12-05 DIAGNOSIS — Z5111 Encounter for antineoplastic chemotherapy: Secondary | ICD-10-CM | POA: Insufficient documentation

## 2021-12-05 DIAGNOSIS — T451X5A Adverse effect of antineoplastic and immunosuppressive drugs, initial encounter: Secondary | ICD-10-CM | POA: Insufficient documentation

## 2021-12-05 LAB — CBC WITH DIFFERENTIAL (CANCER CENTER ONLY)
Abs Immature Granulocytes: 0.01 10*3/uL (ref 0.00–0.07)
Basophils Absolute: 0 10*3/uL (ref 0.0–0.1)
Basophils Relative: 1 %
Eosinophils Absolute: 0.2 10*3/uL (ref 0.0–0.5)
Eosinophils Relative: 4 %
HCT: 41 % (ref 36.0–46.0)
Hemoglobin: 13.3 g/dL (ref 12.0–15.0)
Immature Granulocytes: 0 %
Lymphocytes Relative: 32 %
Lymphs Abs: 1.5 10*3/uL (ref 0.7–4.0)
MCH: 33.8 pg (ref 26.0–34.0)
MCHC: 32.4 g/dL (ref 30.0–36.0)
MCV: 104.3 fL — ABNORMAL HIGH (ref 80.0–100.0)
Monocytes Absolute: 0.4 10*3/uL (ref 0.1–1.0)
Monocytes Relative: 7 %
Neutro Abs: 2.7 10*3/uL (ref 1.7–7.7)
Neutrophils Relative %: 56 %
Platelet Count: 208 10*3/uL (ref 150–400)
RBC: 3.93 MIL/uL (ref 3.87–5.11)
RDW: 13.1 % (ref 11.5–15.5)
WBC Count: 4.8 10*3/uL (ref 4.0–10.5)
nRBC: 0 % (ref 0.0–0.2)

## 2021-12-05 LAB — CMP (CANCER CENTER ONLY)
ALT: 19 U/L (ref 0–44)
AST: 25 U/L (ref 15–41)
Albumin: 3.8 g/dL (ref 3.5–5.0)
Alkaline Phosphatase: 66 U/L (ref 38–126)
Anion gap: 6 (ref 5–15)
BUN: 21 mg/dL (ref 8–23)
CO2: 26 mmol/L (ref 22–32)
Calcium: 8.9 mg/dL (ref 8.9–10.3)
Chloride: 106 mmol/L (ref 98–111)
Creatinine: 0.91 mg/dL (ref 0.44–1.00)
GFR, Estimated: >60 mL/min (ref >60)
Glucose, Bld: 93 mg/dL (ref 70–99)
Potassium: 4.7 mmol/L (ref 3.5–5.1)
Sodium: 138 mmol/L (ref 135–145)
Total Bilirubin: 0.4 mg/dL (ref 0.3–1.2)
Total Protein: 6.5 g/dL (ref 6.5–8.1)

## 2021-12-05 LAB — CEA (ACCESS): CEA (CHCC): 1.34 ng/mL (ref 0.00–5.00)

## 2021-12-05 MED ORDER — SODIUM CHLORIDE 0.9 % IV SOLN
1600.0000 mg/m2 | INTRAVENOUS | Status: DC
  Administered 2021-12-05: 2650 mg via INTRAVENOUS
  Filled 2021-12-05: qty 50

## 2021-12-05 MED ORDER — SODIUM CHLORIDE 0.9 % IV SOLN
7.5000 mg/kg | Freq: Once | INTRAVENOUS | Status: AC
  Administered 2021-12-05: 500 mg via INTRAVENOUS
  Filled 2021-12-05: qty 4

## 2021-12-05 MED ORDER — SODIUM CHLORIDE 0.9 % IV SOLN: Freq: Once | INTRAVENOUS | Status: AC

## 2021-12-05 NOTE — Patient Instructions (Signed)
[facility]   Discharge Instructions: Thank you for choosing [facility] to provide your oncology and hematology care.   If you have a lab appointment with the [facility], please go directly to the [facility] and check in at the registration area.   Wear comfortable clothing and clothing appropriate for easy access to any Portacath or PICC line.   We strive to give you quality time with your provider. You may need to reschedule your appointment if you arrive late (15 or more minutes).  Arriving late affects you and other patients whose appointments are after yours.  Also, if you miss three or more appointments without notifying the office, you may be dismissed from the clinic at the providers discretion.      For prescription refill requests, have your pharmacy contact our office and allow 72 hours for refills to be completed.    Today you received the following chemotherapy and/or immunotherapy agents Bevacizumab-bvzr (ZIRABEV) & Flourouracil (ADRUCIL).      To help prevent nausea and vomiting after your treatment, we encourage you to take your nausea medication as directed.  BELOW ARE SYMPTOMS THAT SHOULD BE REPORTED IMMEDIATELY: *FEVER GREATER THAN 100.4 F (38 C) OR HIGHER *CHILLS OR SWEATING *NAUSEA AND VOMITING THAT IS NOT CONTROLLED WITH YOUR NAUSEA MEDICATION *UNUSUAL SHORTNESS OF BREATH *UNUSUAL BRUISING OR BLEEDING *URINARY PROBLEMS (pain or burning when urinating, or frequent urination) *BOWEL PROBLEMS (unusual diarrhea, constipation, pain near the anus) TENDERNESS IN MOUTH AND THROAT WITH OR WITHOUT PRESENCE OF ULCERS (sore throat, sores in mouth, or a toothache) UNUSUAL RASH, SWELLING OR PAIN  UNUSUAL VAGINAL DISCHARGE OR ITCHING   Items with * indicate a potential emergency and should be followed up as soon as possible or go to the Emergency Department if any problems should occur.  Please show the CHEMOTHERAPY ALERT CARD or  IMMUNOTHERAPY ALERT CARD at check-in to the Emergency Department and triage nurse.  Should you have questions after your visit or need to cancel or reschedule your appointment, please contact [facility]  Dept: [phone]  and follow the prompts.  Office hours are 8:00 a.m. to 4:30 p.m. Monday - Friday. Please note that voicemails left after 4:00 p.m. may not be returned until the following business day.  We are closed weekends and major holidays. You have access to a nurse at all times for urgent questions. Please call the main number to the clinic Dept: [phone] and follow the prompts.   For any non-urgent questions, you may also contact your provider using MyChart. We now offer e-Visits for anyone 38 and older to request care online for non-urgent symptoms. For details visit [email or web address].   Also download the MyChart app! Go to the app store, search "MyChart", open the app, select [facility], and log in with your MyChart username and password.  Due to Covid, a mask is required upon entering the hospital/clinic. If you do not have a mask, one will be given to you upon arrival. For doctor visits, patients may have 1 support person aged 42 or older with them. For treatment visits, patients cannot have anyone with them due to current Covid guidelines and our immunocompromised population.   Bevacizumab injection What is this medication? BEVACIZUMAB (be va SIZ yoo mab) is a monoclonal antibody. It is used to treat many types of cancer. This medicine may be used for other purposes; ask your health care provider or pharmacist if you have questions.  COMMON BRAND NAME(S): Alymsys, Avastin, MVASI, [name] What should I tell my care team before I take this medication? They need to know if you have any of these conditions: diabetes heart disease high blood pressure history of coughing up blood prior anthracycline chemotherapy (e.g., doxorubicin, daunorubicin,  epirubicin) recent or ongoing radiation therapy recent or planning to have surgery stroke an unusual or allergic reaction to bevacizumab, hamster proteins, mouse proteins, other medicines, foods, dyes, or preservatives pregnant or trying to get pregnant breast-feeding How should I use this medication? This medicine is for infusion into a vein. It is given by a health care professional in a hospital or clinic setting. Talk to your pediatrician regarding the use of this medicine in children. Special care may be needed. Overdosage: If you think you have taken too much of this medicine contact a poison control center or emergency room at once. NOTE: This medicine is only for you. Do not share this medicine with others. What if I miss a dose? It is important not to miss your dose. Call your doctor or health care professional if you are unable to keep an appointment. What may interact with this medication? Interactions are not expected. This list may not describe all possible interactions. Give your health care provider a list of all the medicines, herbs, non-prescription drugs, or dietary supplements you use. Also tell them if you smoke, drink alcohol, or use illegal drugs. Some items may interact with your medicine. What should I watch for while using this medication? Your condition will be monitored carefully while you are receiving this medicine. You will need important blood work and urine testing done while you are taking this medicine. This medicine may increase your risk to bruise or bleed. Call your doctor or health care professional if you notice any unusual bleeding. Before having surgery, talk to your health care provider to make sure it is ok. This drug can increase the risk of poor healing of your surgical site or wound. You will need to stop this drug for 28 days before surgery. After surgery, wait at least 28 days before restarting this drug. Make sure the surgical site or wound is  healed enough before restarting this drug. Talk to your health care provider if questions. Do not become pregnant while taking this medicine or for 6 months after stopping it. Women should inform their doctor if they wish to become pregnant or think they might be pregnant. There is a potential for serious side effects to an unborn child. Talk to your health care professional or pharmacist for more information. Do not breast-feed an infant while taking this medicine and for 6 months after the last dose. This medicine has caused ovarian failure in some women. This medicine may interfere with the ability to have a child. You should talk to your doctor or health care professional if you are concerned about your fertility. What side effects may I notice from receiving this medication? Side effects that you should report to your doctor or health care professional as soon as possible: allergic reactions like skin rash, itching or hives, swelling of the face, lips, or tongue chest pain or chest tightness chills coughing up blood high fever seizures severe constipation signs and symptoms of bleeding such as bloody or black, tarry stools; red or dark-brown urine; spitting up blood or brown material that looks like coffee grounds; red spots on the skin; unusual bruising or bleeding from the eye, gums, or nose signs and symptoms of a blood  clot such as breathing problems; chest pain; severe, sudden headache; pain, swelling, warmth in the leg signs and symptoms of a stroke like changes in vision; confusion; trouble speaking or understanding; severe headaches; sudden numbness or weakness of the face, arm or leg; trouble walking; dizziness; loss of balance or coordination stomach pain sweating swelling of legs or ankles vomiting weight gain Side effects that usually do not require medical attention (report to your doctor or health care professional if they continue or are bothersome): back pain changes in  taste decreased appetite dry skin nausea tiredness This list may not describe all possible side effects. Call your doctor for medical advice about side effects. You may report side effects to FDA at 1-800-FDA-1088. Where should I keep my medication? This drug is given in a hospital or clinic and will not be stored at home. NOTE: This sheet is a summary. It may not cover all possible information. If you have questions about this medicine, talk to your doctor, pharmacist, or health care provider.  2022 Elsevier/Gold Standard (2021-07-25 00:00:00)  Fluorouracil, 5-FU injection What is this medication? FLUOROURACIL, 5-FU (flure oh YOOR a sil) is a chemotherapy drug. It slows the growth of cancer cells. This medicine is used to treat many types of cancer like breast cancer, colon or rectal cancer, pancreatic cancer, and stomach cancer. This medicine may be used for other purposes; ask your health care provider or pharmacist if you have questions. COMMON BRAND NAME(S): Adrucil What should I tell my care team before I take this medication? They need to know if you have any of these conditions: blood disorders dihydropyrimidine dehydrogenase (DPD) deficiency infection (especially a virus infection such as chickenpox, cold sores, or herpes) kidney disease liver disease malnourished, poor nutrition recent or ongoing radiation therapy an unusual or allergic reaction to fluorouracil, other chemotherapy, other medicines, foods, dyes, or preservatives pregnant or trying to get pregnant breast-feeding How should I use this medication? This drug is given as an infusion or injection into a vein. It is administered in a hospital or clinic by a specially trained health care professional. Talk to your pediatrician regarding the use of this medicine in children. Special care may be needed. Overdosage: If you think you have taken too much of this medicine contact a poison control center or emergency room  at once. NOTE: This medicine is only for you. Do not share this medicine with others. What if I miss a dose? It is important not to miss your dose. Call your doctor or health care professional if you are unable to keep an appointment. What may interact with this medication? Do not take this medicine with any of the following medications: live virus vaccines This medicine may also interact with the following medications: medicines that treat or prevent blood clots like warfarin, enoxaparin, and dalteparin This list may not describe all possible interactions. Give your health care provider a list of all the medicines, herbs, non-prescription drugs, or dietary supplements you use. Also tell them if you smoke, drink alcohol, or use illegal drugs. Some items may interact with your medicine. What should I watch for while using this medication? Visit your doctor for checks on your progress. This drug may make you feel generally unwell. This is not uncommon, as chemotherapy can affect healthy cells as well as cancer cells. Report any side effects. Continue your course of treatment even though you feel ill unless your doctor tells you to stop. In some cases, you may be given additional medicines  to help with side effects. Follow all directions for their use. Call your doctor or health care professional for advice if you get a fever, chills or sore throat, or other symptoms of a cold or flu. Do not treat yourself. This drug decreases your body's ability to fight infections. Try to avoid being around people who are sick. This medicine may increase your risk to bruise or bleed. Call your doctor or health care professional if you notice any unusual bleeding. Be careful brushing and flossing your teeth or using a toothpick because you may get an infection or bleed more easily. If you have any dental work done, tell your dentist you are receiving this medicine. Avoid taking products that contain aspirin,  acetaminophen, ibuprofen, naproxen, or ketoprofen unless instructed by your doctor. These medicines may hide a fever. Do not become pregnant while taking this medicine. Women should inform their doctor if they wish to become pregnant or think they might be pregnant. There is a potential for serious side effects to an unborn child. Talk to your health care professional or pharmacist for more information. Do not breast-feed an infant while taking this medicine. Men should inform their doctor if they wish to father a child. This medicine may lower sperm counts. Do not treat diarrhea with over the counter products. Contact your doctor if you have diarrhea that lasts more than 2 days or if it is severe and watery. This medicine can make you more sensitive to the sun. Keep out of the sun. If you cannot avoid being in the sun, wear protective clothing and use sunscreen. Do not use sun lamps or tanning beds/booths. What side effects may I notice from receiving this medication? Side effects that you should report to your doctor or health care professional as soon as possible: allergic reactions like skin rash, itching or hives, swelling of the face, lips, or tongue low blood counts - this medicine may decrease the number of white blood cells, red blood cells and platelets. You may be at increased risk for infections and bleeding. signs of infection - fever or chills, cough, sore throat, pain or difficulty passing urine signs of decreased platelets or bleeding - bruising, pinpoint red spots on the skin, black, tarry stools, blood in the urine signs of decreased red blood cells - unusually weak or tired, fainting spells, lightheadedness breathing problems changes in vision chest pain mouth sores nausea and vomiting pain, swelling, redness at site where injected pain, tingling, numbness in the hands or feet redness, swelling, or sores on hands or feet stomach pain unusual bleeding Side effects that usually  do not require medical attention (report to your doctor or health care professional if they continue or are bothersome): changes in finger or toe nails diarrhea dry or itchy skin hair loss headache loss of appetite sensitivity of eyes to the light stomach upset unusually teary eyes This list may not describe all possible side effects. Call your doctor for medical advice about side effects. You may report side effects to FDA at 1-800-FDA-1088. Where should I keep my medication? This drug is given in a hospital or clinic and will not be stored at home. NOTE: This sheet is a summary. It may not cover all possible information. If you have questions about this medicine, talk to your doctor, pharmacist, or health care provider.  2022 Elsevier/Gold Standard (2021-07-25 00:00:00)  The chemotherapy medication bag should finish at 46 hours, 96 hours, or 7 days. For example, if your pump is scheduled  for 46 hours and it was put on at 4:00 p.m., it should finish at 2:00 p.m. the day it is scheduled to come off regardless of your appointment time.     Estimated time to finish at 1:30 p.m. on Thursday 12/07/2021.   If the display on your pump reads "Low Volume" and it is beeping, take the batteries out of the pump and come to the cancer center for it to be taken off.   If the pump alarms go off prior to the pump reading "Low Volume" then call [phone] and someone can assist you.  If the plunger comes out and the chemotherapy medication is leaking out, please use your home chemo spill kit to clean up the spill. Do NOT use paper towels or other household products.  If you have problems or questions regarding your pump, please call either [phone] (24 hours a day) or the cancer center Monday-Friday 8:00 a.m.- 4:30 p.m. at the clinic number and we will assist you. If you are unable to get assistance, then go to the nearest Emergency Department and ask the staff to contact the IV team for  assistance.

## 2021-12-05 NOTE — Progress Notes (Signed)
Patient presents for treatment. RN assessment completed along with the following:  Labs/vitals reviewed - Yes, and within treatment parameters.   Weight within 10% of previous measurement - Yes Informed consent completed and reflects current therapy/intent - Yes, on date 07/09/2020             Provider progress note reviewed - Today's provider note is not yet available. I reviewed the most recent oncology provider progress note in chart dated 11/15/2021. Treatment/Antibody/Supportive plan reviewed - Yes, and there are no adjustments needed for today's treatment. S&H and other orders reviewed - Yes, and there are no additional orders identified. Previous treatment date reviewed - Yes, and the appropriate amount of time has elapsed between treatments. Clinic Hand Off Received from - Yes from Lattie Haw, NP  Patient to proceed with treatment.

## 2021-12-05 NOTE — Progress Notes (Signed)
[facility] OFFICE PROGRESS NOTE   Diagnosis: Colon cancer  INTERVAL HISTORY:   [name] returns as scheduled.  She completed another cycle of 5-FU and Avastin on 11/15/2021.  She denies nausea/vomiting.  No mouth sores.  No diarrhea.  No abdominal pain.  She has a good appetite.  No bleeding.  No shortness of breath or chest pain.  Stable neuropathy symptoms in the feet.  Objective:  Vital signs in last 24 hours:  Blood pressure (!) 147/80, pulse 80, temperature 98.1 F (36.7 C), temperature source Oral, resp. rate 20, height $RemoveBe'5\' 6"'XYoxhlYoa$  (1.676 m), weight 152 lb 3.2 oz (69 kg), last menstrual period 11/19/1996, SpO2 100 %.    HEENT: No thrush or ulcers. Resp: Lungs clear bilaterally. Cardio: Regular rate and rhythm. GI: Abdomen soft and nontender.  No hepatosplenomegaly.  No mass. Vascular: No leg edema. Port-A-Cath without erythema.  Lab Results:  Lab Results  Component Value Date   WBC 4.8 12/05/2021   HGB 13.3 12/05/2021   HCT 41.0 12/05/2021   MCV 104.3 (H) 12/05/2021   PLT 208 12/05/2021   NEUTROABS 2.7 12/05/2021    Imaging:  No results found.  Medications: I have reviewed the patient's current medications.  Assessment/Plan: Adenocarcinoma the left colon, stage IIIc (pT4b,pN2a), status post a left colectomy 05/30/2020, MSS, TMB 13, BRAF V600E Tumor invades the visceral peritoneum, lymphovascular and perineural invasion present, for tumor deposits, 7/13 lymph nodes, negative resection margins, no loss of mismatch repair protein expression CT abdomen/pelvis 05/26/2020-long segment of masslike thickening of the descending colon with associated high-grade colonic obstruction, small colonic wall defect with evidence of a focally contained microperforation, retroperitoneal adenopathy, indeterminate small hypodense liver lesions Elevated preoperative CEA PET 06/22/2020-hypermetabolic liver metastases, periportal and periaortic adenopathy, hypermetabolic  mediastinal and left supraclavicular nodes Cycle 1 FOLFOX 07/05/20 Cycle 2 FOLFOXIRI (dose-reduced 5FU, no bolus) and Avastin 07/19/20  Cycle 3 FOLFOXIRI (Dose reduced 5-FU, no bolus)/Avastin 08/02/2020 Cycle 4 FOLFOXIRI/Avastin 08/16/2020 (Udenyca added) Cycle 5 FOLFOXIRI/Avastin 08/30/2020 CTs 09/09/2020-diminished size of lymph nodes in the chest, abdomen, and pelvis.  Decreased hepatic metastases.  No new evidence of disease progression, fat density lesion surrounding the left hemicolectomy Cycle 6 FOLFOXIRI/Avastin 09/13/2020 Cycle 7 FOLFOX/Avastin 09/27/2020 (irinotecan held) Cycle 8 FOLFOXIRI/Avastin 10/18/2020  Cycle 9 FOLFOXIRI/Avastin 11/01/2020 Cycle 10 FOLFOXIRI/Avastin 11/22/2020 (oxaliplatin held, irinotecan and 5-FU dose reduced) Cycle 11 FOLFOXIRI/Avastin 12/12/2020 (oxaliplatin held) CTs 12/19/2020-decrease in size of liver metastases, no evidence of disease progression Cycle 12 FOLFOXIRI/Avastin 01/04/2021 (oxaliplatin held) Cycle 13 FOLFOXIRI/Avastin 01/24/2021 (oxaliplatin held) Cycle 14 FOLFOXIRI/Avastin 02/14/2021 (oxaliplatin held) Cycle 15 FOLFOXIRI/Avastin 03/07/2021 (oxaliplatin held) Cycle 16 FOLFOXIRI/Avastin 03/29/2021 (oxaliplatin held) CTs 04/14/2021- decreased size of liver metastases.  No new or progressive findings. Cycle 17 FOLFOXIRI/Avastin 04/19/2021 (oxaliplatin held) Cycle 18 FOLFOXIRI/Avastin 05/09/2021 (oxaliplatin held) Cycle 19 FOLFOXIRI/Avastin 05/30/2021 (oxaliplatin held) Cycle 20 FOLFOXIRI/Avastin 06/20/2021 (oxaliplatin held) Cycle 21 FOLFOXIRI/Avastin 07/11/2021 (oxaliplatin held) CTs 07/28/2021-unchanged subcentimeter liver lesions, no evidence of new metastatic disease Cycle 22 5-FU/ Avastin 08/01/2021 Cycle 23 5-FU/Avastin 08/22/2021 Cycle 24 5-FU/Avastin 09/12/2021 Cycle 25 5-FU/Avastin 10/03/2021 Cycle 26 5-FU/Avastin 10/24/2021 Cycle 27 5-FU/Avastin 11/15/2021 Cycle 28 5-FU/Avastin 12/05/2021   Atrial fibrillation with rapid ventricular response  05/26/2020 Mild neutropenia following cycle 3 FOLFOXIRI, Udenyca added with cycle 4 Hypokalemia secondary to diarrhea-potassium supplementation starting 09/13/2020 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 appears stable.  She continues to tolerate 5-FU and Avastin  well.  There is no clinical evidence of disease progression.  Plan to proceed with another cycle today.  Restaging CTs prior to next office visit.  CBC and chemistry panel reviewed.  Labs adequate to proceed with treatment.  She will return for lab, follow-up, treatment 12/26/2021.  She will contact the office in the interim with any problems.     Ned Card ANP/GNP-BC   12/05/2021  12:18 PM

## 2021-12-05 NOTE — Progress Notes (Signed)
Patient seen by Lisa Thomas NP today  Vitals are within treatment parameters.  Labs reviewed by Lisa Thomas NP and are within treatment parameters.  Per physician team, patient is ready for treatment and there are NO modifications to the treatment plan.     

## 2021-12-05 NOTE — Progress Notes (Signed)
Per MD no urine analysis needed today.

## 2021-12-07 ENCOUNTER — Other Ambulatory Visit: Payer: Self-pay

## 2021-12-07 ENCOUNTER — Inpatient Hospital Stay: Payer: Medicare Other

## 2021-12-07 VITALS — BP 132/77 | HR 89 | Temp 99.0°F | Resp 20

## 2021-12-07 DIAGNOSIS — Z79899 Other long term (current) drug therapy: Secondary | ICD-10-CM | POA: Diagnosis not present

## 2021-12-07 DIAGNOSIS — D709 Neutropenia, unspecified: Secondary | ICD-10-CM | POA: Diagnosis not present

## 2021-12-07 DIAGNOSIS — C787 Secondary malignant neoplasm of liver and intrahepatic bile duct: Secondary | ICD-10-CM | POA: Diagnosis not present

## 2021-12-07 DIAGNOSIS — T451X5A Adverse effect of antineoplastic and immunosuppressive drugs, initial encounter: Secondary | ICD-10-CM | POA: Diagnosis not present

## 2021-12-07 DIAGNOSIS — G620 Drug-induced polyneuropathy: Secondary | ICD-10-CM | POA: Diagnosis not present

## 2021-12-07 DIAGNOSIS — C186 Malignant neoplasm of descending colon: Secondary | ICD-10-CM | POA: Diagnosis not present

## 2021-12-07 DIAGNOSIS — C185 Malignant neoplasm of splenic flexure: Secondary | ICD-10-CM

## 2021-12-07 DIAGNOSIS — E876 Hypokalemia: Secondary | ICD-10-CM | POA: Diagnosis not present

## 2021-12-07 DIAGNOSIS — Z5112 Encounter for antineoplastic immunotherapy: Secondary | ICD-10-CM | POA: Diagnosis not present

## 2021-12-07 DIAGNOSIS — Z5111 Encounter for antineoplastic chemotherapy: Secondary | ICD-10-CM | POA: Diagnosis not present

## 2021-12-07 DIAGNOSIS — I4891 Unspecified atrial fibrillation: Secondary | ICD-10-CM | POA: Diagnosis not present

## 2021-12-07 MED ORDER — SODIUM CHLORIDE 0.9% FLUSH
10.0000 mL | INTRAVENOUS | Status: DC | PRN
  Administered 2021-12-07: 10 mL

## 2021-12-07 MED ORDER — HEPARIN SOD (PORK) LOCK FLUSH 100 UNIT/ML IV SOLN
500.0000 [IU] | Freq: Once | INTRAVENOUS | Status: AC | PRN
  Administered 2021-12-07: 500 [IU]

## 2021-12-19 ENCOUNTER — Other Ambulatory Visit (HOSPITAL_COMMUNITY): Payer: Self-pay

## 2021-12-20 ENCOUNTER — Other Ambulatory Visit: Payer: Self-pay

## 2021-12-20 DIAGNOSIS — C185 Malignant neoplasm of splenic flexure: Secondary | ICD-10-CM

## 2021-12-20 NOTE — Addendum Note (Signed)
Addended by: Velna Hatchet on: 12/20/2021 11:49 AM   Modules accepted: Orders

## 2021-12-22 ENCOUNTER — Other Ambulatory Visit: Payer: Self-pay

## 2021-12-22 ENCOUNTER — Ambulatory Visit (HOSPITAL_BASED_OUTPATIENT_CLINIC_OR_DEPARTMENT_OTHER)
Admission: RE | Admit: 2021-12-22 | Discharge: 2021-12-22 | Disposition: A | Discharge status: Home / Self Care | Payer: Medicare Other | Source: Ambulatory Visit | Attending: Oncology | Admitting: Oncology

## 2021-12-22 DIAGNOSIS — C185 Malignant neoplasm of splenic flexure: Secondary | ICD-10-CM | POA: Diagnosis not present

## 2021-12-22 DIAGNOSIS — Z85038 Personal history of other malignant neoplasm of large intestine: Secondary | ICD-10-CM | POA: Diagnosis not present

## 2021-12-22 DIAGNOSIS — K769 Liver disease, unspecified: Secondary | ICD-10-CM | POA: Diagnosis not present

## 2021-12-22 MED ORDER — IOHEXOL 300 MG/ML  SOLN
100.0000 mL | Freq: Once | INTRAMUSCULAR | Status: AC | PRN
  Administered 2021-12-22: 100 mL via INTRAVENOUS

## 2021-12-23 ENCOUNTER — Other Ambulatory Visit: Payer: Self-pay | Admitting: Oncology

## 2021-12-26 ENCOUNTER — Encounter: Payer: Self-pay | Admitting: *Deleted

## 2021-12-26 ENCOUNTER — Inpatient Hospital Stay: Payer: Medicare Other

## 2021-12-26 ENCOUNTER — Other Ambulatory Visit: Payer: Self-pay | Admitting: *Deleted

## 2021-12-26 ENCOUNTER — Encounter: Payer: Self-pay | Admitting: Nurse Practitioner

## 2021-12-26 ENCOUNTER — Other Ambulatory Visit: Payer: Self-pay

## 2021-12-26 ENCOUNTER — Inpatient Hospital Stay: Payer: Medicare Other | Attending: Oncology

## 2021-12-26 ENCOUNTER — Inpatient Hospital Stay: Payer: Medicare Other | Admitting: Nurse Practitioner

## 2021-12-26 VITALS — BP 144/78 | HR 85 | Temp 97.8°F | Resp 18 | Ht 66.0 in | Wt 151.0 lb

## 2021-12-26 VITALS — BP 131/67 | HR 63 | Temp 98.0°F | Resp 20

## 2021-12-26 DIAGNOSIS — C787 Secondary malignant neoplasm of liver and intrahepatic bile duct: Secondary | ICD-10-CM | POA: Diagnosis not present

## 2021-12-26 DIAGNOSIS — Z5112 Encounter for antineoplastic immunotherapy: Secondary | ICD-10-CM | POA: Diagnosis not present

## 2021-12-26 DIAGNOSIS — C185 Malignant neoplasm of splenic flexure: Secondary | ICD-10-CM

## 2021-12-26 DIAGNOSIS — G629 Polyneuropathy, unspecified: Secondary | ICD-10-CM | POA: Diagnosis not present

## 2021-12-26 DIAGNOSIS — I4891 Unspecified atrial fibrillation: Secondary | ICD-10-CM | POA: Insufficient documentation

## 2021-12-26 DIAGNOSIS — Z5111 Encounter for antineoplastic chemotherapy: Secondary | ICD-10-CM | POA: Diagnosis not present

## 2021-12-26 DIAGNOSIS — E876 Hypokalemia: Secondary | ICD-10-CM | POA: Diagnosis not present

## 2021-12-26 DIAGNOSIS — D709 Neutropenia, unspecified: Secondary | ICD-10-CM | POA: Diagnosis not present

## 2021-12-26 DIAGNOSIS — C186 Malignant neoplasm of descending colon: Secondary | ICD-10-CM | POA: Insufficient documentation

## 2021-12-26 LAB — CBC WITH DIFFERENTIAL (CANCER CENTER ONLY)
Abs Immature Granulocytes: 0.01 10*3/uL (ref 0.00–0.07)
Basophils Absolute: 0.1 10*3/uL (ref 0.0–0.1)
Basophils Relative: 1 %
Eosinophils Absolute: 0.2 10*3/uL (ref 0.0–0.5)
Eosinophils Relative: 3 %
HCT: 41.2 % (ref 36.0–46.0)
Hemoglobin: 13.6 g/dL (ref 12.0–15.0)
Immature Granulocytes: 0 %
Lymphocytes Relative: 33 %
Lymphs Abs: 1.7 10*3/uL (ref 0.7–4.0)
MCH: 34.5 pg — ABNORMAL HIGH (ref 26.0–34.0)
MCHC: 33 g/dL (ref 30.0–36.0)
MCV: 104.6 fL — ABNORMAL HIGH (ref 80.0–100.0)
Monocytes Absolute: 0.5 10*3/uL (ref 0.1–1.0)
Monocytes Relative: 9 %
Neutro Abs: 2.9 10*3/uL (ref 1.7–7.7)
Neutrophils Relative %: 54 %
Platelet Count: 204 10*3/uL (ref 150–400)
RBC: 3.94 MIL/uL (ref 3.87–5.11)
RDW: 13.3 % (ref 11.5–15.5)
WBC Count: 5.3 10*3/uL (ref 4.0–10.5)
nRBC: 0 % (ref 0.0–0.2)

## 2021-12-26 LAB — TOTAL PROTEIN, URINE DIPSTICK: Protein, ur: NEGATIVE mg/dL

## 2021-12-26 LAB — CEA (ACCESS): CEA (CHCC): 2.14 ng/mL (ref 0.00–5.00)

## 2021-12-26 LAB — CMP (CANCER CENTER ONLY)
ALT: 17 U/L (ref 0–44)
AST: 23 U/L (ref 15–41)
Albumin: 4 g/dL (ref 3.5–5.0)
Alkaline Phosphatase: 54 U/L (ref 38–126)
Anion gap: 7 (ref 5–15)
BUN: 23 mg/dL (ref 8–23)
CO2: 24 mmol/L (ref 22–32)
Calcium: 9.1 mg/dL (ref 8.9–10.3)
Chloride: 103 mmol/L (ref 98–111)
Creatinine: 0.92 mg/dL (ref 0.44–1.00)
GFR, Estimated: >60 mL/min (ref >60)
Glucose, Bld: 80 mg/dL (ref 70–99)
Potassium: 4.7 mmol/L (ref 3.5–5.1)
Sodium: 134 mmol/L — ABNORMAL LOW (ref 135–145)
Total Bilirubin: 0.5 mg/dL (ref 0.3–1.2)
Total Protein: 7 g/dL (ref 6.5–8.1)

## 2021-12-26 LAB — MAGNESIUM: Magnesium: 2.1 mg/dL (ref 1.7–2.4)

## 2021-12-26 MED ORDER — SODIUM CHLORIDE 0.9 % IV SOLN: Freq: Once | INTRAVENOUS | Status: AC

## 2021-12-26 MED ORDER — SODIUM CHLORIDE 0.9 % IV SOLN
1600.0000 mg/m2 | INTRAVENOUS | Status: DC
  Administered 2021-12-26: 2650 mg via INTRAVENOUS
  Filled 2021-12-26: qty 53

## 2021-12-26 MED ORDER — SODIUM CHLORIDE 0.9 % IV SOLN
7.5000 mg/kg | Freq: Once | INTRAVENOUS | Status: AC
  Administered 2021-12-26: 500 mg via INTRAVENOUS
  Filled 2021-12-26: qty 4

## 2021-12-26 NOTE — Patient Instructions (Signed)
Kathy Robinson   Discharge Instructions: Thank you for choosing [facility] to provide your oncology and hematology care.   If you have a lab appointment with the [facility], please go directly to the [facility] and check in at the registration area.   Wear comfortable clothing and clothing appropriate for easy access to any Portacath or PICC line.   We strive to give you quality time with your provider. You may need to reschedule your appointment if you arrive late (15 or more minutes).  Arriving late affects you and other patients whose appointments are after yours.  Also, if you miss three or more appointments without notifying the office, you may be dismissed from the clinic at the providers discretion.      For prescription refill requests, have your pharmacy contact our office and allow 72 hours for refills to be completed.    Today you received the following chemotherapy and/or immunotherapy agents Bevacizumab-bvzr (ZIRABEV) & Flourouracil (ADRUCIL).      To help prevent nausea and vomiting after your treatment, we encourage you to take your nausea medication as directed.  BELOW ARE SYMPTOMS THAT SHOULD BE REPORTED IMMEDIATELY: *FEVER GREATER THAN 100.4 F (38 C) OR HIGHER *CHILLS OR SWEATING *NAUSEA AND VOMITING THAT IS NOT CONTROLLED WITH YOUR NAUSEA MEDICATION *UNUSUAL SHORTNESS OF BREATH *UNUSUAL BRUISING OR BLEEDING *URINARY PROBLEMS (pain or burning when urinating, or frequent urination) *BOWEL PROBLEMS (unusual diarrhea, constipation, pain near the anus) TENDERNESS IN MOUTH AND THROAT WITH OR WITHOUT PRESENCE OF ULCERS (sore throat, sores in mouth, or a toothache) UNUSUAL RASH, SWELLING OR PAIN  UNUSUAL VAGINAL DISCHARGE OR ITCHING   Items with * indicate a potential emergency and should be followed up as soon as possible or go to the Emergency Department if any problems should occur.  Please show the CHEMOTHERAPY ALERT CARD or  IMMUNOTHERAPY ALERT CARD at check-in to the Emergency Department and triage nurse.  Should you have questions after your visit or need to cancel or reschedule your appointment, please contact [facility]  Dept: [phone]  and follow the prompts.  Office hours are 8:00 a.m. to 4:30 p.m. Monday - Friday. Please note that voicemails left after 4:00 p.m. may not be returned until the following business day.  We are closed weekends and major holidays. You have access to a nurse at all times for urgent questions. Please call the main number to the clinic Dept: [phone] and follow the prompts.   For any non-urgent questions, you may also contact your provider using MyChart. We now offer e-Visits for anyone 58 and older to request care online for non-urgent symptoms. For details visit [email or web address].   Also download the MyChart app! Go to the app store, search "MyChart", open the app, select [facility], and log in with your MyChart username and password.  Due to Covid, a mask is required upon entering the hospital/clinic. If you do not have a mask, one will be given to you upon arrival. For doctor visits, patients may have 1 support person aged 52 or older with them. For treatment visits, patients cannot have anyone with them due to current Covid guidelines and our immunocompromised population.   Bevacizumab injection What is this medication? BEVACIZUMAB (be va SIZ yoo mab) is a monoclonal antibody. It is used to treat many types of cancer. This medicine may be used for other purposes; ask your health care provider or pharmacist if you have questions.  COMMON BRAND NAME(S): Alymsys, Avastin, MVASI, [name] What should I tell my care team before I take this medication? They need to know if you have any of these conditions: diabetes heart disease high blood pressure history of coughing up blood prior anthracycline chemotherapy (e.g., doxorubicin, daunorubicin,  epirubicin) recent or ongoing radiation therapy recent or planning to have surgery stroke an unusual or allergic reaction to bevacizumab, hamster proteins, mouse proteins, other medicines, foods, dyes, or preservatives pregnant or trying to get pregnant breast-feeding How should I use this medication? This medicine is for infusion into a vein. It is given by a health care professional in a hospital or clinic setting. Talk to your pediatrician regarding the use of this medicine in children. Special care may be needed. Overdosage: If you think you have taken too much of this medicine contact a poison control center or emergency room at once. NOTE: This medicine is only for you. Do not share this medicine with others. What if I miss a dose? It is important not to miss your dose. Call your doctor or health care professional if you are unable to keep an appointment. What may interact with this medication? Interactions are not expected. This list may not describe all possible interactions. Give your health care provider a list of all the medicines, herbs, non-prescription drugs, or dietary supplements you use. Also tell them if you smoke, drink alcohol, or use illegal drugs. Some items may interact with your medicine. What should I watch for while using this medication? Your condition will be monitored carefully while you are receiving this medicine. You will need important blood work and urine testing done while you are taking this medicine. This medicine may increase your risk to bruise or bleed. Call your doctor or health care professional if you notice any unusual bleeding. Before having surgery, talk to your health care provider to make sure it is ok. This drug can increase the risk of poor healing of your surgical site or wound. You will need to stop this drug for 28 days before surgery. After surgery, wait at least 28 days before restarting this drug. Make sure the surgical site or wound is  healed enough before restarting this drug. Talk to your health care provider if questions. Do not become pregnant while taking this medicine or for 6 months after stopping it. Women should inform their doctor if they wish to become pregnant or think they might be pregnant. There is a potential for serious side effects to an unborn child. Talk to your health care professional or pharmacist for more information. Do not breast-feed an infant while taking this medicine and for 6 months after the last dose. This medicine has caused ovarian failure in some women. This medicine may interfere with the ability to have a child. You should talk to your doctor or health care professional if you are concerned about your fertility. What side effects may I notice from receiving this medication? Side effects that you should report to your doctor or health care professional as soon as possible: allergic reactions like skin rash, itching or hives, swelling of the face, lips, or tongue chest pain or chest tightness chills coughing up blood high fever seizures severe constipation signs and symptoms of bleeding such as bloody or black, tarry stools; red or dark-brown urine; spitting up blood or brown material that looks like coffee grounds; red spots on the skin; unusual bruising or bleeding from the eye, gums, or nose signs and symptoms of a blood  clot such as breathing problems; chest pain; severe, sudden headache; pain, swelling, warmth in the leg signs and symptoms of a stroke like changes in vision; confusion; trouble speaking or understanding; severe headaches; sudden numbness or weakness of the face, arm or leg; trouble walking; dizziness; loss of balance or coordination stomach pain sweating swelling of legs or ankles vomiting weight gain Side effects that usually do not require medical attention (report to your doctor or health care professional if they continue or are bothersome): back pain changes in  taste decreased appetite dry skin nausea tiredness This list may not describe all possible side effects. Call your doctor for medical advice about side effects. You may report side effects to FDA at 1-800-FDA-1088. Where should I keep my medication? This drug is given in a hospital or clinic and will not be stored at home. NOTE: This sheet is a summary. It may not cover all possible information. If you have questions about this medicine, talk to your doctor, pharmacist, or health care provider.  2022 Elsevier/Gold Standard (2021-07-25 00:00:00)  Fluorouracil, 5-FU injection What is this medication? FLUOROURACIL, 5-FU (flure oh YOOR a sil) is a chemotherapy drug. It slows the growth of cancer cells. This medicine is used to treat many types of cancer like breast cancer, colon or rectal cancer, pancreatic cancer, and stomach cancer. This medicine may be used for other purposes; ask your health care provider or pharmacist if you have questions. COMMON BRAND NAME(S): Adrucil What should I tell my care team before I take this medication? They need to know if you have any of these conditions: blood disorders dihydropyrimidine dehydrogenase (DPD) deficiency infection (especially a virus infection such as chickenpox, cold sores, or herpes) kidney disease liver disease malnourished, poor nutrition recent or ongoing radiation therapy an unusual or allergic reaction to fluorouracil, other chemotherapy, other medicines, foods, dyes, or preservatives pregnant or trying to get pregnant breast-feeding How should I use this medication? This drug is given as an infusion or injection into a vein. It is administered in a hospital or clinic by a specially trained health care professional. Talk to your pediatrician regarding the use of this medicine in children. Special care may be needed. Overdosage: If you think you have taken too much of this medicine contact a poison control center or emergency room  at once. NOTE: This medicine is only for you. Do not share this medicine with others. What if I miss a dose? It is important not to miss your dose. Call your doctor or health care professional if you are unable to keep an appointment. What may interact with this medication? Do not take this medicine with any of the following medications: live virus vaccines This medicine may also interact with the following medications: medicines that treat or prevent blood clots like warfarin, enoxaparin, and dalteparin This list may not describe all possible interactions. Give your health care provider a list of all the medicines, herbs, non-prescription drugs, or dietary supplements you use. Also tell them if you smoke, drink alcohol, or use illegal drugs. Some items may interact with your medicine. What should I watch for while using this medication? Visit your doctor for checks on your progress. This drug may make you feel generally unwell. This is not uncommon, as chemotherapy can affect healthy cells as well as cancer cells. Report any side effects. Continue your course of treatment even though you feel ill unless your doctor tells you to stop. In some cases, you may be given additional medicines  to help with side effects. Follow all directions for their use. Call your doctor or health care professional for advice if you get a fever, chills or sore throat, or other symptoms of a cold or flu. Do not treat yourself. This drug decreases your body's ability to fight infections. Try to avoid being around people who are sick. This medicine may increase your risk to bruise or bleed. Call your doctor or health care professional if you notice any unusual bleeding. Be careful brushing and flossing your teeth or using a toothpick because you may get an infection or bleed more easily. If you have any dental work done, tell your dentist you are receiving this medicine. Avoid taking products that contain aspirin,  acetaminophen, ibuprofen, naproxen, or ketoprofen unless instructed by your doctor. These medicines may hide a fever. Do not become pregnant while taking this medicine. Women should inform their doctor if they wish to become pregnant or think they might be pregnant. There is a potential for serious side effects to an unborn child. Talk to your health care professional or pharmacist for more information. Do not breast-feed an infant while taking this medicine. Men should inform their doctor if they wish to father a child. This medicine may lower sperm counts. Do not treat diarrhea with over the counter products. Contact your doctor if you have diarrhea that lasts more than 2 days or if it is severe and watery. This medicine can make you more sensitive to the sun. Keep out of the sun. If you cannot avoid being in the sun, wear protective clothing and use sunscreen. Do not use sun lamps or tanning beds/booths. What side effects may I notice from receiving this medication? Side effects that you should report to your doctor or health care professional as soon as possible: allergic reactions like skin rash, itching or hives, swelling of the face, lips, or tongue low blood counts - this medicine may decrease the number of white blood cells, red blood cells and platelets. You may be at increased risk for infections and bleeding. signs of infection - fever or chills, cough, sore throat, pain or difficulty passing urine signs of decreased platelets or bleeding - bruising, pinpoint red spots on the skin, black, tarry stools, blood in the urine signs of decreased red blood cells - unusually weak or tired, fainting spells, lightheadedness breathing problems changes in vision chest pain mouth sores nausea and vomiting pain, swelling, redness at site where injected pain, tingling, numbness in the hands or feet redness, swelling, or sores on hands or feet stomach pain unusual bleeding Side effects that usually  do not require medical attention (report to your doctor or health care professional if they continue or are bothersome): changes in finger or toe nails diarrhea dry or itchy skin hair loss headache loss of appetite sensitivity of eyes to the light stomach upset unusually teary eyes This list may not describe all possible side effects. Call your doctor for medical advice about side effects. You may report side effects to FDA at 1-800-FDA-1088. Where should I keep my medication? This drug is given in a hospital or clinic and will not be stored at home. NOTE: This sheet is a summary. It may not cover all possible information. If you have questions about this medicine, talk to your doctor, pharmacist, or health care provider.  2022 Elsevier/Gold Standard (2021-07-25 00:00:00)  The chemotherapy medication bag should finish at 46 hours, 96 hours, or 7 days. For example, if your pump is scheduled  for 46 hours and it was put on at 4:00 p.m., it should finish at 2:00 p.m. the day it is scheduled to come off regardless of your appointment time.     Estimated time to finish at 3:30 p.m. on Thursday 12/28/2021 .   If the display on your pump reads "Low Volume" and it is beeping, take the batteries out of the pump and come to the cancer center for it to be taken off.   If the pump alarms go off prior to the pump reading "Low Volume" then call [phone] and someone can assist you.  If the plunger comes out and the chemotherapy medication is leaking out, please use your home chemo spill kit to clean up the spill. Do NOT use paper towels or other household products.  If you have problems or questions regarding your pump, please call either [phone] (24 hours a day) or the cancer center Monday-Friday 8:00 a.m.- 4:30 p.m. at the clinic number and we will assist you. If you are unable to get assistance, then go to the nearest Emergency Department and ask the staff to contact the IV team for  assistance.

## 2021-12-26 NOTE — Progress Notes (Signed)
Patient presents for treatment. RN assessment completed along with the following:  Labs/vitals reviewed - Yes, and within treatment parameters.   Weight within 10% of previous measurement - Yes Informed consent completed and reflects current therapy/intent - Yes, on date 07/09/2020             Provider progress note reviewed - Today's provider note is not yet available. I reviewed the most recent oncology provider progress note in chart dated 12/05/2021. Treatment/Antibody/Supportive plan reviewed - Yes, and there are no adjustments needed for today's treatment. S&H and other orders reviewed - Yes, and there are no additional orders identified. Previous treatment date reviewed - Yes, and the appropriate amount of time has elapsed between treatments. Clinic Hand Off Received from - Yes from [facility], RN  Patient to proceed with treatment.

## 2021-12-26 NOTE — Progress Notes (Signed)
[facility] OFFICE PROGRESS NOTE   Diagnosis: Colon cancer  INTERVAL HISTORY:   [name] returns as scheduled.  She completed another cycle of 5-FU and Avastin 12/05/2021.  She denies nausea/vomiting.  No mouth sores.  No diarrhea.  No hand or foot pain or redness.  Some improvement in neuropathy symptoms.  No bleeding.  No abdominal pain.  She has a good appetite.  Objective:  Vital signs in last 24 hours:  Blood pressure (!) 144/78, pulse 85, temperature 97.8 F (36.6 C), temperature source Oral, resp. rate 18, height $RemoveBe'5\' 6"'QQTNAknUh$  (1.676 m), weight 151 lb (68.5 kg), last menstrual period 11/19/1996, SpO2 100 %.    HEENT: No thrush or ulcers. Resp: Lungs clear bilaterally. Cardio: Regular rate and rhythm. GI: Abdomen soft and nontender.  No hepatomegaly. Vascular: No leg edema. Skin: Palms without erythema. Port-A-Cath without erythema.  Lab Results:  Lab Results  Component Value Date   WBC 5.3 12/26/2021   HGB 13.6 12/26/2021   HCT 41.2 12/26/2021   MCV 104.6 (H) 12/26/2021   PLT 204 12/26/2021   NEUTROABS 2.9 12/26/2021    Imaging:  No results found.  Medications: I have reviewed the patient's current medications.  Assessment/Plan: Adenocarcinoma the left colon, stage IIIc (pT4b,pN2a), status post a left colectomy 05/30/2020, MSS, TMB 13, BRAF V600E Tumor invades the visceral peritoneum, lymphovascular and perineural invasion present, for tumor deposits, 7/13 lymph nodes, negative resection margins, no loss of mismatch repair protein expression CT abdomen/pelvis 05/26/2020-long segment of masslike thickening of the descending colon with associated high-grade colonic obstruction, small colonic wall defect with evidence of a focally contained microperforation, retroperitoneal adenopathy, indeterminate small hypodense liver lesions Elevated preoperative CEA PET 06/22/2020-hypermetabolic liver metastases, periportal and periaortic adenopathy, hypermetabolic  mediastinal and left supraclavicular nodes Cycle 1 FOLFOX 07/05/20 Cycle 2 FOLFOXIRI (dose-reduced 5FU, no bolus) and Avastin 07/19/20  Cycle 3 FOLFOXIRI (Dose reduced 5-FU, no bolus)/Avastin 08/02/2020 Cycle 4 FOLFOXIRI/Avastin 08/16/2020 (Udenyca added) Cycle 5 FOLFOXIRI/Avastin 08/30/2020 CTs 09/09/2020-diminished size of lymph nodes in the chest, abdomen, and pelvis.  Decreased hepatic metastases.  No new evidence of disease progression, fat density lesion surrounding the left hemicolectomy Cycle 6 FOLFOXIRI/Avastin 09/13/2020 Cycle 7 FOLFOX/Avastin 09/27/2020 (irinotecan held) Cycle 8 FOLFOXIRI/Avastin 10/18/2020  Cycle 9 FOLFOXIRI/Avastin 11/01/2020 Cycle 10 FOLFOXIRI/Avastin 11/22/2020 (oxaliplatin held, irinotecan and 5-FU dose reduced) Cycle 11 FOLFOXIRI/Avastin 12/12/2020 (oxaliplatin held) CTs 12/19/2020-decrease in size of liver metastases, no evidence of disease progression Cycle 12 FOLFOXIRI/Avastin 01/04/2021 (oxaliplatin held) Cycle 13 FOLFOXIRI/Avastin 01/24/2021 (oxaliplatin held) Cycle 14 FOLFOXIRI/Avastin 02/14/2021 (oxaliplatin held) Cycle 15 FOLFOXIRI/Avastin 03/07/2021 (oxaliplatin held) Cycle 16 FOLFOXIRI/Avastin 03/29/2021 (oxaliplatin held) CTs 04/14/2021- decreased size of liver metastases.  No new or progressive findings. Cycle 17 FOLFOXIRI/Avastin 04/19/2021 (oxaliplatin held) Cycle 18 FOLFOXIRI/Avastin 05/09/2021 (oxaliplatin held) Cycle 19 FOLFOXIRI/Avastin 05/30/2021 (oxaliplatin held) Cycle 20 FOLFOXIRI/Avastin 06/20/2021 (oxaliplatin held) Cycle 21 FOLFOXIRI/Avastin 07/11/2021 (oxaliplatin held) CTs 07/28/2021-unchanged subcentimeter liver lesions, no evidence of new metastatic disease Cycle 22 5-FU/ Avastin 08/01/2021 Cycle 23 5-FU/Avastin 08/22/2021 Cycle 24 5-FU/Avastin 09/12/2021 Cycle 25 5-FU/Avastin 10/03/2021 Cycle 26 5-FU/Avastin 10/24/2021 Cycle 27 5-FU/Avastin 11/15/2021 Cycle 28 5-FU/Avastin 12/05/2021 CTs 12/22/2021-grossly similar subcentimeter low-attenuation lesions  in the liver compatible with treated metastasis.  No evidence for new metastatic disease in the chest, abdomen or pelvis.   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 Connon appears stable.  She is on active treatment with 5-FU/Avastin on a 3-week schedule.  Recent restaging CTs show stable liver lesions and no new metastatic disease.  Plan to continue 5-FU/Avastin every 3 weeks.  She agrees with this plan.  CBC and chemistry panel reviewed.  Labs adequate to proceed with treatment.  Urine negative for protein.  She will return for lab, follow-up, 5-FU/Avastin in 3 weeks.  We are available to see her sooner if needed.  Patient seen with Dr. Benay Spice.    Ned Card ANP/GNP-BC   12/26/2021  1:41 PM  This was a shared visit with Ned Card.  We reviewed the restaging CT findings with [name].  There is no clinical or CT evidence of disease progression.  We discussed a treatment break versus continuing 5-FU/Avastin.  She would like to continue the current treatment.  Ms. Ganaway will return for an office visit in 3 weeks.  I was present for greater than 50% of today's visit.  I performed medical stage making.  Julieanne Manson, MD

## 2021-12-26 NOTE — Progress Notes (Signed)
Patient seen by Lisa Thomas NP today  Vitals are within treatment parameters.  Labs reviewed by Lisa Thomas NP and are within treatment parameters.  Per physician team, patient is ready for treatment and there are NO modifications to the treatment plan.     

## 2021-12-28 ENCOUNTER — Other Ambulatory Visit: Payer: Self-pay

## 2021-12-28 ENCOUNTER — Inpatient Hospital Stay: Payer: Medicare Other

## 2021-12-28 VITALS — BP 152/83 | HR 88 | Temp 98.1°F | Resp 18

## 2021-12-28 DIAGNOSIS — Z5111 Encounter for antineoplastic chemotherapy: Secondary | ICD-10-CM | POA: Diagnosis not present

## 2021-12-28 DIAGNOSIS — I4891 Unspecified atrial fibrillation: Secondary | ICD-10-CM | POA: Diagnosis not present

## 2021-12-28 DIAGNOSIS — C186 Malignant neoplasm of descending colon: Secondary | ICD-10-CM | POA: Diagnosis not present

## 2021-12-28 DIAGNOSIS — G629 Polyneuropathy, unspecified: Secondary | ICD-10-CM | POA: Diagnosis not present

## 2021-12-28 DIAGNOSIS — D709 Neutropenia, unspecified: Secondary | ICD-10-CM | POA: Diagnosis not present

## 2021-12-28 DIAGNOSIS — E876 Hypokalemia: Secondary | ICD-10-CM | POA: Diagnosis not present

## 2021-12-28 DIAGNOSIS — C787 Secondary malignant neoplasm of liver and intrahepatic bile duct: Secondary | ICD-10-CM | POA: Diagnosis not present

## 2021-12-28 DIAGNOSIS — C185 Malignant neoplasm of splenic flexure: Secondary | ICD-10-CM

## 2021-12-28 DIAGNOSIS — Z5112 Encounter for antineoplastic immunotherapy: Secondary | ICD-10-CM | POA: Diagnosis not present

## 2021-12-28 MED ORDER — HEPARIN SOD (PORK) LOCK FLUSH 100 UNIT/ML IV SOLN
500.0000 [IU] | Freq: Once | INTRAVENOUS | Status: AC | PRN
  Administered 2021-12-28: 500 [IU]

## 2021-12-28 MED ORDER — SODIUM CHLORIDE 0.9% FLUSH
10.0000 mL | INTRAVENOUS | Status: DC | PRN
  Administered 2021-12-28: 10 mL

## 2021-12-28 NOTE — Patient Instructions (Signed)

## 2022-01-14 ENCOUNTER — Other Ambulatory Visit: Payer: Self-pay | Admitting: Oncology

## 2022-01-16 ENCOUNTER — Other Ambulatory Visit (HOSPITAL_BASED_OUTPATIENT_CLINIC_OR_DEPARTMENT_OTHER): Payer: Self-pay

## 2022-01-16 ENCOUNTER — Other Ambulatory Visit: Payer: Self-pay

## 2022-01-16 ENCOUNTER — Encounter: Payer: Self-pay | Admitting: Nurse Practitioner

## 2022-01-16 ENCOUNTER — Inpatient Hospital Stay: Payer: Medicare Other

## 2022-01-16 ENCOUNTER — Inpatient Hospital Stay: Payer: Medicare Other | Admitting: Nurse Practitioner

## 2022-01-16 VITALS — BP 143/69 | HR 68 | Resp 20

## 2022-01-16 VITALS — BP 146/79 | HR 70 | Temp 98.7°F | Resp 20 | Ht 66.0 in | Wt 152.2 lb

## 2022-01-16 DIAGNOSIS — Z5111 Encounter for antineoplastic chemotherapy: Secondary | ICD-10-CM | POA: Diagnosis not present

## 2022-01-16 DIAGNOSIS — C185 Malignant neoplasm of splenic flexure: Secondary | ICD-10-CM

## 2022-01-16 DIAGNOSIS — C186 Malignant neoplasm of descending colon: Secondary | ICD-10-CM | POA: Diagnosis not present

## 2022-01-16 DIAGNOSIS — E876 Hypokalemia: Secondary | ICD-10-CM | POA: Diagnosis not present

## 2022-01-16 DIAGNOSIS — G629 Polyneuropathy, unspecified: Secondary | ICD-10-CM | POA: Diagnosis not present

## 2022-01-16 DIAGNOSIS — I4891 Unspecified atrial fibrillation: Secondary | ICD-10-CM | POA: Diagnosis not present

## 2022-01-16 DIAGNOSIS — Z5112 Encounter for antineoplastic immunotherapy: Secondary | ICD-10-CM | POA: Diagnosis not present

## 2022-01-16 DIAGNOSIS — D709 Neutropenia, unspecified: Secondary | ICD-10-CM | POA: Diagnosis not present

## 2022-01-16 DIAGNOSIS — C787 Secondary malignant neoplasm of liver and intrahepatic bile duct: Secondary | ICD-10-CM | POA: Diagnosis not present

## 2022-01-16 LAB — CBC WITH DIFFERENTIAL (CANCER CENTER ONLY)
Abs Immature Granulocytes: 0.01 10*3/uL (ref 0.00–0.07)
Basophils Absolute: 0 10*3/uL (ref 0.0–0.1)
Basophils Relative: 1 %
Eosinophils Absolute: 0.2 10*3/uL (ref 0.0–0.5)
Eosinophils Relative: 5 %
HCT: 40.1 % (ref 36.0–46.0)
Hemoglobin: 13.1 g/dL (ref 12.0–15.0)
Immature Granulocytes: 0 %
Lymphocytes Relative: 37 %
Lymphs Abs: 1.6 10*3/uL (ref 0.7–4.0)
MCH: 34.8 pg — ABNORMAL HIGH (ref 26.0–34.0)
MCHC: 32.7 g/dL (ref 30.0–36.0)
MCV: 106.6 fL — ABNORMAL HIGH (ref 80.0–100.0)
Monocytes Absolute: 0.3 10*3/uL (ref 0.1–1.0)
Monocytes Relative: 7 %
Neutro Abs: 2.2 10*3/uL (ref 1.7–7.7)
Neutrophils Relative %: 50 %
Platelet Count: 205 10*3/uL (ref 150–400)
RBC: 3.76 MIL/uL — ABNORMAL LOW (ref 3.87–5.11)
RDW: 13.8 % (ref 11.5–15.5)
WBC Count: 4.4 10*3/uL (ref 4.0–10.5)
nRBC: 0 % (ref 0.0–0.2)

## 2022-01-16 LAB — CMP (CANCER CENTER ONLY)
ALT: 26 U/L (ref 0–44)
AST: 28 U/L (ref 15–41)
Albumin: 4 g/dL (ref 3.5–5.0)
Alkaline Phosphatase: 72 U/L (ref 38–126)
Anion gap: 8 (ref 5–15)
BUN: 25 mg/dL — ABNORMAL HIGH (ref 8–23)
CO2: 25 mmol/L (ref 22–32)
Calcium: 9.2 mg/dL (ref 8.9–10.3)
Chloride: 102 mmol/L (ref 98–111)
Creatinine: 0.91 mg/dL (ref 0.44–1.00)
GFR, Estimated: >60 mL/min (ref >60)
Glucose, Bld: 146 mg/dL — ABNORMAL HIGH (ref 70–99)
Potassium: 4.4 mmol/L (ref 3.5–5.1)
Sodium: 135 mmol/L (ref 135–145)
Total Bilirubin: 0.4 mg/dL (ref 0.3–1.2)
Total Protein: 6.4 g/dL — ABNORMAL LOW (ref 6.5–8.1)

## 2022-01-16 LAB — CEA (ACCESS): CEA (CHCC): 2 ng/mL (ref 0.00–5.00)

## 2022-01-16 MED ORDER — SODIUM CHLORIDE 0.9% FLUSH: 10.0000 mL | INTRAVENOUS | Status: DC | PRN

## 2022-01-16 MED ORDER — POTASSIUM CHLORIDE ER 10 MEQ PO CPCR
ORAL_CAPSULE | Freq: Two times a day (BID) | ORAL | 2 refills | Status: DC
  Filled 2022-01-16: qty 60, 30d supply, fill #0
  Filled 2022-02-26: qty 60, 30d supply, fill #1
  Filled 2022-04-03: qty 60, 30d supply, fill #2

## 2022-01-16 MED ORDER — SODIUM CHLORIDE 0.9 % IV SOLN: Freq: Once | INTRAVENOUS | Status: AC

## 2022-01-16 MED ORDER — SODIUM CHLORIDE 0.9 % IV SOLN
7.5000 mg/kg | Freq: Once | INTRAVENOUS | Status: AC
  Administered 2022-01-16: 500 mg via INTRAVENOUS
  Filled 2022-01-16: qty 16

## 2022-01-16 MED ORDER — SODIUM CHLORIDE 0.9 % IV SOLN
1600.0000 mg/m2 | INTRAVENOUS | Status: DC
  Administered 2022-01-16: 2650 mg via INTRAVENOUS
  Filled 2022-01-16: qty 53

## 2022-01-16 NOTE — Progress Notes (Signed)
[facility] OFFICE PROGRESS NOTE   Diagnosis: Colon cancer  INTERVAL HISTORY:   Kathy Robinson returns as scheduled.  She completed another cycle of 5-FU and Avastin 12/26/2021.  She feels well.  No nausea or vomiting.  No mouth sores.  No diarrhea.  No hand or foot pain or redness.  She has a good appetite.  She denies pain.  No bleeding.  Stable neuropathy symptoms.  Objective:  Vital signs in last 24 hours:  Blood pressure (!) 146/79, pulse 70, temperature 98.7 F (37.1 C), temperature source Oral, resp. rate 20, height $RemoveBe'5\' 6"'tGPBopjgM$  (1.676 m), weight 152 lb 3.2 oz (69 kg), last menstrual period 11/19/1996, SpO2 100 %.    HEENT: No thrush or ulcers. Resp: Lungs clear bilaterally. Cardio: Regular rate and rhythm. GI: No hepatomegaly. Vascular: No leg edema. Skin: Palms with mild erythema. Port-A-Cath without erythema.   Lab Results:  Lab Results  Component Value Date   WBC 4.4 01/16/2022   HGB 13.1 01/16/2022   HCT 40.1 01/16/2022   MCV 106.6 (H) 01/16/2022   PLT 205 01/16/2022   NEUTROABS 2.2 01/16/2022    Imaging:  No results found.  Medications: I have reviewed the patient's current medications.  Assessment/Plan: Adenocarcinoma the left colon, stage IIIc (pT4b,pN2a), status post a left colectomy 05/30/2020, MSS, TMB 13, BRAF V600E Tumor invades the visceral peritoneum, lymphovascular and perineural invasion present, for tumor deposits, 7/13 lymph nodes, negative resection margins, no loss of mismatch repair protein expression CT abdomen/pelvis 05/26/2020-long segment of masslike thickening of the descending colon with associated high-grade colonic obstruction, small colonic wall defect with evidence of a focally contained microperforation, retroperitoneal adenopathy, indeterminate small hypodense liver lesions Elevated preoperative CEA PET 06/22/2020-hypermetabolic liver metastases, periportal and periaortic adenopathy, hypermetabolic mediastinal and left  supraclavicular nodes Cycle 1 FOLFOX 07/05/20 Cycle 2 FOLFOXIRI (dose-reduced 5FU, no bolus) and Avastin 07/19/20  Cycle 3 FOLFOXIRI (Dose reduced 5-FU, no bolus)/Avastin 08/02/2020 Cycle 4 FOLFOXIRI/Avastin 08/16/2020 (Udenyca added) Cycle 5 FOLFOXIRI/Avastin 08/30/2020 CTs 09/09/2020-diminished size of lymph nodes in the chest, abdomen, and pelvis.  Decreased hepatic metastases.  No new evidence of disease progression, fat density lesion surrounding the left hemicolectomy Cycle 6 FOLFOXIRI/Avastin 09/13/2020 Cycle 7 FOLFOX/Avastin 09/27/2020 (irinotecan held) Cycle 8 FOLFOXIRI/Avastin 10/18/2020  Cycle 9 FOLFOXIRI/Avastin 11/01/2020 Cycle 10 FOLFOXIRI/Avastin 11/22/2020 (oxaliplatin held, irinotecan and 5-FU dose reduced) Cycle 11 FOLFOXIRI/Avastin 12/12/2020 (oxaliplatin held) CTs 12/19/2020-decrease in size of liver metastases, no evidence of disease progression Cycle 12 FOLFOXIRI/Avastin 01/04/2021 (oxaliplatin held) Cycle 13 FOLFOXIRI/Avastin 01/24/2021 (oxaliplatin held) Cycle 14 FOLFOXIRI/Avastin 02/14/2021 (oxaliplatin held) Cycle 15 FOLFOXIRI/Avastin 03/07/2021 (oxaliplatin held) Cycle 16 FOLFOXIRI/Avastin 03/29/2021 (oxaliplatin held) CTs 04/14/2021- decreased size of liver metastases.  No new or progressive findings. Cycle 17 FOLFOXIRI/Avastin 04/19/2021 (oxaliplatin held) Cycle 18 FOLFOXIRI/Avastin 05/09/2021 (oxaliplatin held) Cycle 19 FOLFOXIRI/Avastin 05/30/2021 (oxaliplatin held) Cycle 20 FOLFOXIRI/Avastin 06/20/2021 (oxaliplatin held) Cycle 21 FOLFOXIRI/Avastin 07/11/2021 (oxaliplatin held) CTs 07/28/2021-unchanged subcentimeter liver lesions, no evidence of new metastatic disease Cycle 22 5-FU/ Avastin 08/01/2021 Cycle 23 5-FU/Avastin 08/22/2021 Cycle 24 5-FU/Avastin 09/12/2021 Cycle 25 5-FU/Avastin 10/03/2021 Cycle 26 5-FU/Avastin 10/24/2021 Cycle 27 5-FU/Avastin 11/15/2021 Cycle 28 5-FU/Avastin 12/05/2021 CTs 12/22/2021-grossly similar subcentimeter low-attenuation lesions in the liver  compatible with treated metastasis.  No evidence for new metastatic disease in the chest, abdomen or pelvis. Cycle 29 5-FU/Avastin 12/26/2021 Cycle 30 5-FU/Avastin 01/16/2022    Atrial fibrillation with rapid ventricular response 05/26/2020 Mild neutropenia following cycle 3 FOLFOXIRI, Udenyca added with cycle 4 Hypokalemia secondary to diarrhea-potassium supplementation starting 09/13/2020 Oxaliplatin neuropathy-moderate loss of  vibratory sense on exam 11/22/2020, oxaliplatin held with cycle 10, cycle 11, 12, 13 chemotherapy, improved      Disposition: Kathy Robinson appears stable.  She continues to tolerate treatment well.  Plan to proceed with cycle 30 5-FU/Avastin today as scheduled.  CBC and chemistry panel reviewed.  Labs adequate to proceed as above.  She will return for follow-up in 3 weeks.    Ned Card ANP/GNP-BC   01/16/2022  2:39 PM

## 2022-01-16 NOTE — Progress Notes (Signed)
Patient presents for treatment. RN assessment completed along with the following:  Labs/vitals reviewed - Yes, and within treatment parameters.   Weight within 10% of previous measurement - Yes Informed consent completed and reflects current therapy/intent - Yes, on date 07/19/20             Provider progress note reviewed - Yes, today's provider note was reviewed. Treatment/Antibody/Supportive plan reviewed - Yes, and there are no adjustments needed for today's treatment. S&H and other orders reviewed - Yes, and there are no additional orders identified. Previous treatment date reviewed - Yes, and the appropriate amount of time has elapsed between treatments.  Patient to proceed with treatment.

## 2022-01-16 NOTE — Patient Instructions (Signed)

## 2022-01-16 NOTE — Patient Instructions (Signed)
[facility]   Discharge Instructions: Thank you for choosing [facility] to provide your oncology and hematology care.   If you have a lab appointment with the [facility], please go directly to the [facility] and check in at the registration area.   Wear comfortable clothing and clothing appropriate for easy access to any Portacath or PICC line.   We strive to give you quality time with your provider. You may need to reschedule your appointment if you arrive late (15 or more minutes).  Arriving late affects you and other patients whose appointments are after yours.  Also, if you miss three or more appointments without notifying the office, you may be dismissed from the clinic at the providers discretion.      For prescription refill requests, have your pharmacy contact our office and allow 72 hours for refills to be completed.    Today you received the following chemotherapy and/or immunotherapy agents Bevacizumab-bvzr (ZIRABEV) & Flourouracil (ADRUCIL).      To help prevent nausea and vomiting after your treatment, we encourage you to take your nausea medication as directed.  BELOW ARE SYMPTOMS THAT SHOULD BE REPORTED IMMEDIATELY: *FEVER GREATER THAN 100.4 F (38 C) OR HIGHER *CHILLS OR SWEATING *NAUSEA AND VOMITING THAT IS NOT CONTROLLED WITH YOUR NAUSEA MEDICATION *UNUSUAL SHORTNESS OF BREATH *UNUSUAL BRUISING OR BLEEDING *URINARY PROBLEMS (pain or burning when urinating, or frequent urination) *BOWEL PROBLEMS (unusual diarrhea, constipation, pain near the anus) TENDERNESS IN MOUTH AND THROAT WITH OR WITHOUT PRESENCE OF ULCERS (sore throat, sores in mouth, or a toothache) UNUSUAL RASH, SWELLING OR PAIN  UNUSUAL VAGINAL DISCHARGE OR ITCHING   Items with * indicate a potential emergency and should be followed up as soon as possible or go to the Emergency Department if any problems should occur.  Please show the CHEMOTHERAPY ALERT CARD or  IMMUNOTHERAPY ALERT CARD at check-in to the Emergency Department and triage nurse.  Should you have questions after your visit or need to cancel or reschedule your appointment, please contact [facility]  Dept: [phone]  and follow the prompts.  Office hours are 8:00 a.m. to 4:30 p.m. Monday - Friday. Please note that voicemails left after 4:00 p.m. may not be returned until the following business day.  We are closed weekends and major holidays. You have access to a nurse at all times for urgent questions. Please call the main number to the clinic Dept: [phone] and follow the prompts.   For any non-urgent questions, you may also contact your provider using MyChart. We now offer e-Visits for anyone 40 and older to request care online for non-urgent symptoms. For details visit [email or web address].   Also download the MyChart app! Go to the app store, search "MyChart", open the app, select [facility], and log in with your MyChart username and password.  Due to Covid, a mask is required upon entering the hospital/clinic. If you do not have a mask, one will be given to you upon arrival. For doctor visits, patients may have 1 support person aged 45 or older with them. For treatment visits, patients cannot have anyone with them due to current Covid guidelines and our immunocompromised population.   Bevacizumab injection What is this medication? BEVACIZUMAB (be va SIZ yoo mab) is a monoclonal antibody. It is used to treat many types of cancer. This medicine may be used for other purposes; ask your health care provider or pharmacist if you have questions.  COMMON BRAND NAME(S): Alymsys, Avastin, MVASI, [name] What should I tell my care team before I take this medication? They need to know if you have any of these conditions: diabetes heart disease high blood pressure history of coughing up blood prior anthracycline chemotherapy (e.g., doxorubicin, daunorubicin,  epirubicin) recent or ongoing radiation therapy recent or planning to have surgery stroke an unusual or allergic reaction to bevacizumab, hamster proteins, mouse proteins, other medicines, foods, dyes, or preservatives pregnant or trying to get pregnant breast-feeding How should I use this medication? This medicine is for infusion into a vein. It is given by a health care professional in a hospital or clinic setting. Talk to your pediatrician regarding the use of this medicine in children. Special care may be needed. Overdosage: If you think you have taken too much of this medicine contact a poison control center or emergency room at once. NOTE: This medicine is only for you. Do not share this medicine with others. What if I miss a dose? It is important not to miss your dose. Call your doctor or health care professional if you are unable to keep an appointment. What may interact with this medication? Interactions are not expected. This list may not describe all possible interactions. Give your health care provider a list of all the medicines, herbs, non-prescription drugs, or dietary supplements you use. Also tell them if you smoke, drink alcohol, or use illegal drugs. Some items may interact with your medicine. What should I watch for while using this medication? Your condition will be monitored carefully while you are receiving this medicine. You will need important blood work and urine testing done while you are taking this medicine. This medicine may increase your risk to bruise or bleed. Call your doctor or health care professional if you notice any unusual bleeding. Before having surgery, talk to your health care provider to make sure it is ok. This drug can increase the risk of poor healing of your surgical site or wound. You will need to stop this drug for 28 days before surgery. After surgery, wait at least 28 days before restarting this drug. Make sure the surgical site or wound is  healed enough before restarting this drug. Talk to your health care provider if questions. Do not become pregnant while taking this medicine or for 6 months after stopping it. Women should inform their doctor if they wish to become pregnant or think they might be pregnant. There is a potential for serious side effects to an unborn child. Talk to your health care professional or pharmacist for more information. Do not breast-feed an infant while taking this medicine and for 6 months after the last dose. This medicine has caused ovarian failure in some women. This medicine may interfere with the ability to have a child. You should talk to your doctor or health care professional if you are concerned about your fertility. What side effects may I notice from receiving this medication? Side effects that you should report to your doctor or health care professional as soon as possible: allergic reactions like skin rash, itching or hives, swelling of the face, lips, or tongue chest pain or chest tightness chills coughing up blood high fever seizures severe constipation signs and symptoms of bleeding such as bloody or black, tarry stools; red or dark-brown urine; spitting up blood or brown material that looks like coffee grounds; red spots on the skin; unusual bruising or bleeding from the eye, gums, or nose signs and symptoms of a blood  clot such as breathing problems; chest pain; severe, sudden headache; pain, swelling, warmth in the leg signs and symptoms of a stroke like changes in vision; confusion; trouble speaking or understanding; severe headaches; sudden numbness or weakness of the face, arm or leg; trouble walking; dizziness; loss of balance or coordination stomach pain sweating swelling of legs or ankles vomiting weight gain Side effects that usually do not require medical attention (report to your doctor or health care professional if they continue or are bothersome): back pain changes in  taste decreased appetite dry skin nausea tiredness This list may not describe all possible side effects. Call your doctor for medical advice about side effects. You may report side effects to FDA at 1-800-FDA-1088. Where should I keep my medication? This drug is given in a hospital or clinic and will not be stored at home. NOTE: This sheet is a summary. It may not cover all possible information. If you have questions about this medicine, talk to your doctor, pharmacist, or health care provider.  2022 Elsevier/Gold Standard (2021-07-25 00:00:00)  Fluorouracil, 5-FU injection What is this medication? FLUOROURACIL, 5-FU (flure oh YOOR a sil) is a chemotherapy drug. It slows the growth of cancer cells. This medicine is used to treat many types of cancer like breast cancer, colon or rectal cancer, pancreatic cancer, and stomach cancer. This medicine may be used for other purposes; ask your health care provider or pharmacist if you have questions. COMMON BRAND NAME(S): Adrucil What should I tell my care team before I take this medication? They need to know if you have any of these conditions: blood disorders dihydropyrimidine dehydrogenase (DPD) deficiency infection (especially a virus infection such as chickenpox, cold sores, or herpes) kidney disease liver disease malnourished, poor nutrition recent or ongoing radiation therapy an unusual or allergic reaction to fluorouracil, other chemotherapy, other medicines, foods, dyes, or preservatives pregnant or trying to get pregnant breast-feeding How should I use this medication? This drug is given as an infusion or injection into a vein. It is administered in a hospital or clinic by a specially trained health care professional. Talk to your pediatrician regarding the use of this medicine in children. Special care may be needed. Overdosage: If you think you have taken too much of this medicine contact a poison control center or emergency room  at once. NOTE: This medicine is only for you. Do not share this medicine with others. What if I miss a dose? It is important not to miss your dose. Call your doctor or health care professional if you are unable to keep an appointment. What may interact with this medication? Do not take this medicine with any of the following medications: live virus vaccines This medicine may also interact with the following medications: medicines that treat or prevent blood clots like warfarin, enoxaparin, and dalteparin This list may not describe all possible interactions. Give your health care provider a list of all the medicines, herbs, non-prescription drugs, or dietary supplements you use. Also tell them if you smoke, drink alcohol, or use illegal drugs. Some items may interact with your medicine. What should I watch for while using this medication? Visit your doctor for checks on your progress. This drug may make you feel generally unwell. This is not uncommon, as chemotherapy can affect healthy cells as well as cancer cells. Report any side effects. Continue your course of treatment even though you feel ill unless your doctor tells you to stop. In some cases, you may be given additional medicines  to help with side effects. Follow all directions for their use. Call your doctor or health care professional for advice if you get a fever, chills or sore throat, or other symptoms of a cold or flu. Do not treat yourself. This drug decreases your body's ability to fight infections. Try to avoid being around people who are sick. This medicine may increase your risk to bruise or bleed. Call your doctor or health care professional if you notice any unusual bleeding. Be careful brushing and flossing your teeth or using a toothpick because you may get an infection or bleed more easily. If you have any dental work done, tell your dentist you are receiving this medicine. Avoid taking products that contain aspirin,  acetaminophen, ibuprofen, naproxen, or ketoprofen unless instructed by your doctor. These medicines may hide a fever. Do not become pregnant while taking this medicine. Women should inform their doctor if they wish to become pregnant or think they might be pregnant. There is a potential for serious side effects to an unborn child. Talk to your health care professional or pharmacist for more information. Do not breast-feed an infant while taking this medicine. Men should inform their doctor if they wish to father a child. This medicine may lower sperm counts. Do not treat diarrhea with over the counter products. Contact your doctor if you have diarrhea that lasts more than 2 days or if it is severe and watery. This medicine can make you more sensitive to the sun. Keep out of the sun. If you cannot avoid being in the sun, wear protective clothing and use sunscreen. Do not use sun lamps or tanning beds/booths. What side effects may I notice from receiving this medication? Side effects that you should report to your doctor or health care professional as soon as possible: allergic reactions like skin rash, itching or hives, swelling of the face, lips, or tongue low blood counts - this medicine may decrease the number of white blood cells, red blood cells and platelets. You may be at increased risk for infections and bleeding. signs of infection - fever or chills, cough, sore throat, pain or difficulty passing urine signs of decreased platelets or bleeding - bruising, pinpoint red spots on the skin, black, tarry stools, blood in the urine signs of decreased red blood cells - unusually weak or tired, fainting spells, lightheadedness breathing problems changes in vision chest pain mouth sores nausea and vomiting pain, swelling, redness at site where injected pain, tingling, numbness in the hands or feet redness, swelling, or sores on hands or feet stomach pain unusual bleeding Side effects that usually  do not require medical attention (report to your doctor or health care professional if they continue or are bothersome): changes in finger or toe nails diarrhea dry or itchy skin hair loss headache loss of appetite sensitivity of eyes to the light stomach upset unusually teary eyes This list may not describe all possible side effects. Call your doctor for medical advice about side effects. You may report side effects to FDA at 1-800-FDA-1088. Where should I keep my medication? This drug is given in a hospital or clinic and will not be stored at home. NOTE: This sheet is a summary. It may not cover all possible information. If you have questions about this medicine, talk to your doctor, pharmacist, or health care provider.  2022 Elsevier/Gold Standard (2021-07-25 00:00:00)  The chemotherapy medication bag should finish at 46 hours, 96 hours, or 7 days. For example, if your pump is scheduled  for 46 hours and it was put on at 4:00 p.m., it should finish at 2:00 p.m. the day it is scheduled to come off regardless of your appointment time.     Estimated time to finish at 3:30 p.m. on Thursday 01/18/2022 .   If the display on your pump reads "Low Volume" and it is beeping, take the batteries out of the pump and come to the cancer center for it to be taken off.   If the pump alarms go off prior to the pump reading "Low Volume" then call [phone] and someone can assist you.  If the plunger comes out and the chemotherapy medication is leaking out, please use your home chemo spill kit to clean up the spill. Do NOT use paper towels or other household products.  If you have problems or questions regarding your pump, please call either [phone] (24 hours a day) or the cancer center Monday-Friday 8:00 a.m.- 4:30 p.m. at the clinic number and we will assist you. If you are unable to get assistance, then go to the nearest Emergency Department and ask the staff to contact the IV team for  assistance.

## 2022-01-18 ENCOUNTER — Inpatient Hospital Stay: Payer: Medicare Other | Attending: Oncology

## 2022-01-18 ENCOUNTER — Other Ambulatory Visit: Payer: Self-pay

## 2022-01-18 VITALS — BP 143/86 | HR 88 | Temp 98.3°F | Resp 20

## 2022-01-18 DIAGNOSIS — C186 Malignant neoplasm of descending colon: Secondary | ICD-10-CM | POA: Insufficient documentation

## 2022-01-18 DIAGNOSIS — C185 Malignant neoplasm of splenic flexure: Secondary | ICD-10-CM

## 2022-01-18 DIAGNOSIS — Z5111 Encounter for antineoplastic chemotherapy: Secondary | ICD-10-CM | POA: Diagnosis not present

## 2022-01-18 DIAGNOSIS — Z452 Encounter for adjustment and management of vascular access device: Secondary | ICD-10-CM | POA: Diagnosis not present

## 2022-01-18 MED ORDER — HEPARIN SOD (PORK) LOCK FLUSH 100 UNIT/ML IV SOLN
500.0000 [IU] | Freq: Once | INTRAVENOUS | Status: AC | PRN
  Administered 2022-01-18: 500 [IU]

## 2022-01-18 MED ORDER — SODIUM CHLORIDE 0.9% FLUSH
10.0000 mL | INTRAVENOUS | Status: DC | PRN
  Administered 2022-01-18: 10 mL

## 2022-01-18 NOTE — Patient Instructions (Signed)

## 2022-02-04 ENCOUNTER — Other Ambulatory Visit: Payer: Self-pay | Admitting: Oncology

## 2022-02-06 ENCOUNTER — Inpatient Hospital Stay: Payer: Medicare Other | Admitting: Oncology

## 2022-02-06 ENCOUNTER — Inpatient Hospital Stay: Payer: Medicare Other

## 2022-02-06 ENCOUNTER — Encounter: Payer: Self-pay | Admitting: *Deleted

## 2022-02-06 ENCOUNTER — Other Ambulatory Visit: Payer: Self-pay

## 2022-02-06 VITALS — BP 144/75 | HR 77 | Temp 98.7°F | Resp 19 | Ht 66.0 in | Wt 152.8 lb

## 2022-02-06 DIAGNOSIS — C185 Malignant neoplasm of splenic flexure: Secondary | ICD-10-CM

## 2022-02-06 DIAGNOSIS — Z452 Encounter for adjustment and management of vascular access device: Secondary | ICD-10-CM | POA: Diagnosis not present

## 2022-02-06 DIAGNOSIS — Z5111 Encounter for antineoplastic chemotherapy: Secondary | ICD-10-CM | POA: Diagnosis not present

## 2022-02-06 DIAGNOSIS — C186 Malignant neoplasm of descending colon: Secondary | ICD-10-CM | POA: Diagnosis not present

## 2022-02-06 LAB — CBC WITH DIFFERENTIAL (CANCER CENTER ONLY)
Abs Immature Granulocytes: 0.01 10*3/uL (ref 0.00–0.07)
Basophils Absolute: 0 10*3/uL (ref 0.0–0.1)
Basophils Relative: 1 %
Eosinophils Absolute: 0.3 10*3/uL (ref 0.0–0.5)
Eosinophils Relative: 6 %
HCT: 40.4 % (ref 36.0–46.0)
Hemoglobin: 13.4 g/dL (ref 12.0–15.0)
Immature Granulocytes: 0 %
Lymphocytes Relative: 34 %
Lymphs Abs: 1.6 10*3/uL (ref 0.7–4.0)
MCH: 35.2 pg — ABNORMAL HIGH (ref 26.0–34.0)
MCHC: 33.2 g/dL (ref 30.0–36.0)
MCV: 106 fL — ABNORMAL HIGH (ref 80.0–100.0)
Monocytes Absolute: 0.4 10*3/uL (ref 0.1–1.0)
Monocytes Relative: 10 %
Neutro Abs: 2.2 10*3/uL (ref 1.7–7.7)
Neutrophils Relative %: 49 %
Platelet Count: 196 10*3/uL (ref 150–400)
RBC: 3.81 MIL/uL — ABNORMAL LOW (ref 3.87–5.11)
RDW: 13.5 % (ref 11.5–15.5)
WBC Count: 4.5 10*3/uL (ref 4.0–10.5)
nRBC: 0 % (ref 0.0–0.2)

## 2022-02-06 LAB — CEA (ACCESS): CEA (CHCC): 1.61 ng/mL (ref 0.00–5.00)

## 2022-02-06 LAB — CMP (CANCER CENTER ONLY)
ALT: 16 U/L (ref 0–44)
AST: 22 U/L (ref 15–41)
Albumin: 4.1 g/dL (ref 3.5–5.0)
Alkaline Phosphatase: 59 U/L (ref 38–126)
Anion gap: 7 (ref 5–15)
BUN: 29 mg/dL — ABNORMAL HIGH (ref 8–23)
CO2: 27 mmol/L (ref 22–32)
Calcium: 9.4 mg/dL (ref 8.9–10.3)
Chloride: 106 mmol/L (ref 98–111)
Creatinine: 1.05 mg/dL — ABNORMAL HIGH (ref 0.44–1.00)
GFR, Estimated: 54 mL/min — ABNORMAL LOW (ref >60)
Glucose, Bld: 83 mg/dL (ref 70–99)
Potassium: 4.4 mmol/L (ref 3.5–5.1)
Sodium: 140 mmol/L (ref 135–145)
Total Bilirubin: 0.4 mg/dL (ref 0.3–1.2)
Total Protein: 7 g/dL (ref 6.5–8.1)

## 2022-02-06 LAB — TOTAL PROTEIN, URINE DIPSTICK: Protein, ur: NEGATIVE mg/dL

## 2022-02-06 MED ORDER — SODIUM CHLORIDE 0.9 % IV SOLN
7.5000 mg/kg | Freq: Once | INTRAVENOUS | Status: AC
  Administered 2022-02-06: 500 mg via INTRAVENOUS
  Filled 2022-02-06: qty 4

## 2022-02-06 MED ORDER — SODIUM CHLORIDE 0.9 % IV SOLN
1600.0000 mg/m2 | INTRAVENOUS | Status: DC
  Administered 2022-02-06: 2650 mg via INTRAVENOUS
  Filled 2022-02-06: qty 53

## 2022-02-06 MED ORDER — SODIUM CHLORIDE 0.9 % IV SOLN: Freq: Once | INTRAVENOUS | Status: AC

## 2022-02-06 NOTE — Progress Notes (Signed)
?[facility] ?OFFICE PROGRESS NOTE ? ? ?Diagnosis: Colon cancer ? ?INTERVAL HISTORY:  ? ?Kathy Robinson complete another cycle of 5-FU/bevacizumab on 01/16/2022.  No mouth sores, nausea, diarrhea, bleeding, or symptom of thrombosis.  She feels well.  No new complaint. ? ?Objective: ? ?Vital signs in last 24 hours: ? ?Blood pressure (!) 144/75, pulse 77, temperature 98.7 ?F (37.1 ?C), temperature source Oral, resp. rate 19, height $RemoveBe'5\' 6"'ocppgXFvs$  (1.676 m), weight 152 lb 12.8 oz (69.3 kg), last menstrual period 11/19/1996, SpO2 100 %. ?  ? ?HEENT: No thrush or ulcers ?Resp: Lungs clear bilaterally ?Cardio: Regular rate and rhythm ?GI: No hepatosplenomegaly ?Vascular: No leg edema ?Skin: Palms without erythema ? ?Portacath/PICC-without erythema ? ?Lab Results: ? ?Lab Results  ?Component Value Date  ? WBC 4.5 02/06/2022  ? HGB 13.4 02/06/2022  ? HCT 40.4 02/06/2022  ? MCV 106.0 (H) 02/06/2022  ? PLT 196 02/06/2022  ? NEUTROABS 2.2 02/06/2022  ? ? ?CMP  ?Lab Results  ?Component Value Date  ? NA 135 01/16/2022  ? K 4.4 01/16/2022  ? CL 102 01/16/2022  ? CO2 25 01/16/2022  ? GLUCOSE 146 (H) 01/16/2022  ? BUN 25 (H) 01/16/2022  ? CREATININE 0.91 01/16/2022  ? CALCIUM 9.2 01/16/2022  ? PROT 6.4 (L) 01/16/2022  ? ALBUMIN 4.0 01/16/2022  ? AST 28 01/16/2022  ? ALT 26 01/16/2022  ? ALKPHOS 72 01/16/2022  ? BILITOT 0.4 01/16/2022  ? GFRNONAA >60 01/16/2022  ? GFRAA >60 08/16/2020  ? ? ?Lab Results  ?Component Value Date  ? CEA1 1.16 03/29/2021  ? CEA 2.00 01/16/2022  ? ? ?Lab Results  ?Component Value Date  ? INR 1.1 05/30/2020  ? LABPROT 13.5 05/30/2020  ? ? ?Imaging: ? ?No results found. ? ?Medications: I have reviewed the patient's current medications. ? ? ?Assessment/Plan: ?Adenocarcinoma the left colon, stage IIIc (pT4b,pN2a), status post a left colectomy 05/30/2020, MSS, TMB 13, BRAF V600E ?Tumor invades the visceral peritoneum, lymphovascular and perineural invasion present, for tumor deposits, 7/13 lymph nodes,  negative resection margins, no loss of mismatch repair protein expression ?CT abdomen/pelvis 05/26/2020-long segment of masslike thickening of the descending colon with associated high-grade colonic obstruction, small colonic wall defect with evidence of a focally contained microperforation, retroperitoneal adenopathy, indeterminate small hypodense liver lesions ?Elevated preoperative CEA ?PET 06/22/2020-hypermetabolic liver metastases, periportal and periaortic adenopathy, hypermetabolic mediastinal and left supraclavicular nodes ?Cycle 1 FOLFOX 07/05/20 ?Cycle 2 FOLFOXIRI (dose-reduced 5FU, no bolus) and Avastin 07/19/20  ?Cycle 3 FOLFOXIRI (Dose reduced 5-FU, no bolus)/Avastin 08/02/2020 ?Cycle 4 FOLFOXIRI/Avastin 08/16/2020 (Udenyca added) ?Cycle 5 FOLFOXIRI/Avastin 08/30/2020 ?CTs 09/09/2020-diminished size of lymph nodes in the chest, abdomen, and pelvis.  Decreased hepatic metastases.  No new evidence of disease progression, fat density lesion surrounding the left hemicolectomy ?Cycle 6 FOLFOXIRI/Avastin 09/13/2020 ?Cycle 7 FOLFOX/Avastin 09/27/2020 (irinotecan held) ?Cycle 8 FOLFOXIRI/Avastin 10/18/2020  ?Cycle 9 FOLFOXIRI/Avastin 11/01/2020 ?Cycle 10 FOLFOXIRI/Avastin 11/22/2020 (oxaliplatin held, irinotecan and 5-FU dose reduced) ?Cycle 11 FOLFOXIRI/Avastin 12/12/2020 (oxaliplatin held) ?CTs 12/19/2020-decrease in size of liver metastases, no evidence of disease progression ?Cycle 12 FOLFOXIRI/Avastin 01/04/2021 (oxaliplatin held) ?Cycle 13 FOLFOXIRI/Avastin 01/24/2021 (oxaliplatin held) ?Cycle 14 FOLFOXIRI/Avastin 02/14/2021 (oxaliplatin held) ?Cycle 15 FOLFOXIRI/Avastin 03/07/2021 (oxaliplatin held) ?Cycle 16 FOLFOXIRI/Avastin 03/29/2021 (oxaliplatin held) ?CTs 04/14/2021- decreased size of liver metastases.  No new or progressive findings. ?Cycle 17 FOLFOXIRI/Avastin 04/19/2021 (oxaliplatin held) ?Cycle 18 FOLFOXIRI/Avastin 05/09/2021 (oxaliplatin held) ?Cycle 19 FOLFOXIRI/Avastin 05/30/2021 (oxaliplatin held) ?Cycle 20  FOLFOXIRI/Avastin 06/20/2021 (oxaliplatin held) ?Cycle 21 FOLFOXIRI/Avastin 07/11/2021 (oxaliplatin held) ?CTs 07/28/2021-unchanged subcentimeter  liver lesions, no evidence of new metastatic disease ?Cycle 22 5-FU/ Avastin 08/01/2021 ?Cycle 23 5-FU/Avastin 08/22/2021 ?Cycle 24 5-FU/Avastin 09/12/2021 ?Cycle 25 5-FU/Avastin 10/03/2021 ?Cycle 26 5-FU/Avastin 10/24/2021 ?Cycle 27 5-FU/Avastin 11/15/2021 ?Cycle 28 5-FU/Avastin 12/05/2021 ?CTs 12/22/2021-grossly similar subcentimeter low-attenuation lesions in the liver compatible with treated metastasis.  No evidence for new metastatic disease in the chest, abdomen or pelvis. ?Cycle 29 5-FU/Avastin 12/26/2021 ?Cycle 30 5-FU/Avastin 01/16/2022 ?Cycle 41 5-FU/Avastin 02/06/2022 ?  ? ?Atrial fibrillation with rapid ventricular response 05/26/2020 ?Mild neutropenia following cycle 3 FOLFOXIRI, Udenyca added with cycle 4 ?Hypokalemia secondary to diarrhea-potassium supplementation starting 09/13/2020 ?Oxaliplatin neuropathy-moderate loss of vibratory sense on exam 11/22/2020, oxaliplatin held with cycle 10, cycle 11, 12, 13 chemotherapy, improved ?  ?  ? ? ?Disposition: ?[name] appears unchanged.  She continues to tolerate the treatment well.  We discussed a treatment break and changing from 5-fluorouracil to capecitabine.  She would like to continue the current treatment and schedule. ? ?She will complete another cycle of 5-FU/bevacizumab today.  She will return for an office visit and chemotherapy in 3 weeks. ? ?[name], MD ? ?02/06/2022  ?11:44 AM ? ? ?

## 2022-02-06 NOTE — Patient Instructions (Signed)
[facility]   ?Discharge Instructions: ?Thank you for choosing [facility] to provide your oncology and hematology care.  ? ?If you have a lab appointment with the [facility], please go directly to the [facility] and check in at the registration area. ?  ?Wear comfortable clothing and clothing appropriate for easy access to any Portacath or PICC line.  ? ?We strive to give you quality time with your provider. You may need to reschedule your appointment if you arrive late (15 or more minutes).  Arriving late affects you and other patients whose appointments are after yours.  Also, if you miss three or more appointments without notifying the office, you may be dismissed from the clinic at the provider?s discretion.    ?  ?For prescription refill requests, have your pharmacy contact our office and allow 72 hours for refills to be completed.   ? ?Today you received the following chemotherapy and/or immunotherapy agents Bevacizumab-bvzr (ZIRABEV) & Flourouracil (ADRUCIL).    ?  ?To help prevent nausea and vomiting after your treatment, we encourage you to take your nausea medication as directed. ? ?BELOW ARE SYMPTOMS THAT SHOULD BE REPORTED IMMEDIATELY: ?*FEVER GREATER THAN 100.4 F (38 ?C) OR HIGHER ?*CHILLS OR SWEATING ?*NAUSEA AND VOMITING THAT IS NOT CONTROLLED WITH YOUR NAUSEA MEDICATION ?*UNUSUAL SHORTNESS OF BREATH ?*UNUSUAL BRUISING OR BLEEDING ?*URINARY PROBLEMS (pain or burning when urinating, or frequent urination) ?*BOWEL PROBLEMS (unusual diarrhea, constipation, pain near the anus) ?TENDERNESS IN MOUTH AND THROAT WITH OR WITHOUT PRESENCE OF ULCERS (sore throat, sores in mouth, or a toothache) ?UNUSUAL RASH, SWELLING OR PAIN  ?UNUSUAL VAGINAL DISCHARGE OR ITCHING  ? ?Items with * indicate a potential emergency and should be followed up as soon as possible or go to the Emergency Department if any problems should occur. ? ?Please show the CHEMOTHERAPY ALERT CARD or  IMMUNOTHERAPY ALERT CARD at check-in to the Emergency Department and triage nurse. ? ?Should you have questions after your visit or need to cancel or reschedule your appointment, please contact [facility]  Dept: [phone]  and follow the prompts.  Office hours are 8:00 a.m. to 4:30 p.m. Monday - Friday. Please note that voicemails left after 4:00 p.m. may not be returned until the following business day.  We are closed weekends and major holidays. You have access to a nurse at all times for urgent questions. Please call the main number to the clinic Dept: [phone] and follow the prompts. ? ? ?For any non-urgent questions, you may also contact your provider using MyChart. We now offer e-Visits for anyone 14 and older to request care online for non-urgent symptoms. For details visit [email or web address]. ?  ?Also download the MyChart app! Go to the app store, search "MyChart", open the app, select [facility], and log in with your MyChart username and password. ? ?Due to Covid, a mask is required upon entering the hospital/clinic. If you do not have a mask, one will be given to you upon arrival. For doctor visits, patients may have 1 support person aged 50 or older with them. For treatment visits, patients cannot have anyone with them due to current Covid guidelines and our immunocompromised population.  ? ?Bevacizumab injection ?What is this medication? ?BEVACIZUMAB (be va SIZ yoo mab) is a monoclonal antibody. It is used to treat many types of cancer. ?This medicine may be used for other purposes; ask your health care provider or pharmacist if you have questions. ?  COMMON BRAND NAME(S): Alymsys, Avastin, MVASI, Zirabev ?What should I tell my care team before I take this medication? ?They need to know if you have any of these conditions: ?diabetes ?heart disease ?high blood pressure ?history of coughing up blood ?prior anthracycline chemotherapy (e.g., doxorubicin, daunorubicin,  epirubicin) ?recent or ongoing radiation therapy ?recent or planning to have surgery ?stroke ?an unusual or allergic reaction to bevacizumab, hamster proteins, mouse proteins, other medicines, foods, dyes, or preservatives ?pregnant or trying to get pregnant ?breast-feeding ?How should I use this medication? ?This medicine is for infusion into a vein. It is given by a health care professional in a hospital or clinic setting. ?Talk to your pediatrician regarding the use of this medicine in children. Special care may be needed. ?Overdosage: If you think you have taken too much of this medicine contact a poison control center or emergency room at once. ?NOTE: This medicine is only for you. Do not share this medicine with others. ?What if I miss a dose? ?It is important not to miss your dose. Call your doctor or health care professional if you are unable to keep an appointment. ?What may interact with this medication? ?Interactions are not expected. ?This list may not describe all possible interactions. Give your health care provider a list of all the medicines, herbs, non-prescription drugs, or dietary supplements you use. Also tell them if you smoke, drink alcohol, or use illegal drugs. Some items may interact with your medicine. ?What should I watch for while using this medication? ?Your condition will be monitored carefully while you are receiving this medicine. You will need important blood work and urine testing done while you are taking this medicine. ?This medicine may increase your risk to bruise or bleed. Call your doctor or health care professional if you notice any unusual bleeding. ?Before having surgery, talk to your health care provider to make sure it is ok. This drug can increase the risk of poor healing of your surgical site or wound. You will need to stop this drug for 28 days before surgery. After surgery, wait at least 28 days before restarting this drug. Make sure the surgical site or wound is  healed enough before restarting this drug. Talk to your health care provider if questions. ?Do not become pregnant while taking this medicine or for 6 months after stopping it. Women should inform their doctor if they wish to become pregnant or think they might be pregnant. There is a potential for serious side effects to an unborn child. Talk to your health care professional or pharmacist for more information. Do not breast-feed an infant while taking this medicine and for 6 months after the last dose. ?This medicine has caused ovarian failure in some women. This medicine may interfere with the ability to have a child. You should talk to your doctor or health care professional if you are concerned about your fertility. ?What side effects may I notice from receiving this medication? ?Side effects that you should report to your doctor or health care professional as soon as possible: ?allergic reactions like skin rash, itching or hives, swelling of the face, lips, or tongue ?chest pain or chest tightness ?chills ?coughing up blood ?high fever ?seizures ?severe constipation ?signs and symptoms of bleeding such as bloody or black, tarry stools; red or dark-brown urine; spitting up blood or brown material that looks like coffee grounds; red spots on the skin; unusual bruising or bleeding from the eye, gums, or nose ?signs and symptoms of a blood  clot such as breathing problems; chest pain; severe, sudden headache; pain, swelling, warmth in the leg ?signs and symptoms of a stroke like changes in vision; confusion; trouble speaking or understanding; severe headaches; sudden numbness or weakness of the face, arm or leg; trouble walking; dizziness; loss of balance or coordination ?stomach pain ?sweating ?swelling of legs or ankles ?vomiting ?weight gain ?Side effects that usually do not require medical attention (report to your doctor or health care professional if they continue or are bothersome): ?back pain ?changes in  taste ?decreased appetite ?dry skin ?nausea ?tiredness ?This list may not describe all possible side effects. Call your doctor for medical advice about side effects. You may report side effects to FDA at 1-8

## 2022-02-06 NOTE — Progress Notes (Signed)
Patient presents for treatment. RN assessment completed along with the following: ? ?Labs/vitals reviewed - Yes, and within treatment parameters.   ?Weight within 10% of previous measurement - Yes ?Informed consent completed and reflects current therapy/intent - Yes, on date 07/19/2020             ?Provider progress note reviewed - Yes, today's provider note was reviewed. ?Treatment/Antibody/Supportive plan reviewed - Yes, and there are no adjustments needed for today's treatment. ?S&H and other orders reviewed - Yes, and there are no additional orders identified. ?Previous treatment date reviewed - Yes, and the appropriate amount of time has elapsed between treatments. ?Clinic Hand Off Received from - Yes from Susan-C,RN ? ?Patient to proceed with treatment.  ? ?

## 2022-02-06 NOTE — Progress Notes (Signed)
Patient seen by Dr. Sherrill today ? ?Vitals are within treatment parameters. ? ?Labs reviewed by Dr. Sherrill and are within treatment parameters. ? ?Per physician team, patient is ready for treatment and there are NO modifications to the treatment plan.  ?

## 2022-02-08 ENCOUNTER — Inpatient Hospital Stay: Payer: Medicare Other

## 2022-02-08 ENCOUNTER — Other Ambulatory Visit: Payer: Self-pay

## 2022-02-08 VITALS — BP 157/75 | HR 75 | Temp 97.9°F | Resp 18

## 2022-02-08 DIAGNOSIS — C186 Malignant neoplasm of descending colon: Secondary | ICD-10-CM | POA: Diagnosis not present

## 2022-02-08 DIAGNOSIS — C185 Malignant neoplasm of splenic flexure: Secondary | ICD-10-CM

## 2022-02-08 DIAGNOSIS — Z5111 Encounter for antineoplastic chemotherapy: Secondary | ICD-10-CM | POA: Diagnosis not present

## 2022-02-08 DIAGNOSIS — Z452 Encounter for adjustment and management of vascular access device: Secondary | ICD-10-CM | POA: Diagnosis not present

## 2022-02-08 MED ORDER — SODIUM CHLORIDE 0.9% FLUSH
10.0000 mL | INTRAVENOUS | Status: DC | PRN
  Administered 2022-02-08: 10 mL

## 2022-02-08 MED ORDER — HEPARIN SOD (PORK) LOCK FLUSH 100 UNIT/ML IV SOLN
500.0000 [IU] | Freq: Once | INTRAVENOUS | Status: AC | PRN
  Administered 2022-02-08: 500 [IU]

## 2022-02-08 NOTE — Patient Instructions (Signed)

## 2022-02-25 ENCOUNTER — Other Ambulatory Visit: Payer: Self-pay | Admitting: Oncology

## 2022-02-26 ENCOUNTER — Other Ambulatory Visit (HOSPITAL_COMMUNITY): Payer: Self-pay

## 2022-02-27 ENCOUNTER — Inpatient Hospital Stay: Payer: Medicare Other | Attending: Oncology

## 2022-02-27 ENCOUNTER — Inpatient Hospital Stay: Payer: Medicare Other | Admitting: Oncology

## 2022-02-27 ENCOUNTER — Inpatient Hospital Stay: Payer: Medicare Other

## 2022-02-27 ENCOUNTER — Encounter: Payer: Self-pay | Admitting: *Deleted

## 2022-02-27 VITALS — BP 155/66 | HR 70 | Temp 97.8°F | Resp 18 | Ht 66.0 in | Wt 153.8 lb

## 2022-02-27 DIAGNOSIS — I4891 Unspecified atrial fibrillation: Secondary | ICD-10-CM | POA: Insufficient documentation

## 2022-02-27 DIAGNOSIS — E876 Hypokalemia: Secondary | ICD-10-CM | POA: Insufficient documentation

## 2022-02-27 DIAGNOSIS — C787 Secondary malignant neoplasm of liver and intrahepatic bile duct: Secondary | ICD-10-CM | POA: Insufficient documentation

## 2022-02-27 DIAGNOSIS — D709 Neutropenia, unspecified: Secondary | ICD-10-CM | POA: Insufficient documentation

## 2022-02-27 DIAGNOSIS — Z5111 Encounter for antineoplastic chemotherapy: Secondary | ICD-10-CM | POA: Diagnosis not present

## 2022-02-27 DIAGNOSIS — C185 Malignant neoplasm of splenic flexure: Secondary | ICD-10-CM | POA: Diagnosis not present

## 2022-02-27 DIAGNOSIS — C186 Malignant neoplasm of descending colon: Secondary | ICD-10-CM | POA: Diagnosis not present

## 2022-02-27 LAB — CMP (CANCER CENTER ONLY)
ALT: 20 U/L (ref 0–44)
AST: 25 U/L (ref 15–41)
Albumin: 3.9 g/dL (ref 3.5–5.0)
Alkaline Phosphatase: 64 U/L (ref 38–126)
Anion gap: 7 (ref 5–15)
BUN: 29 mg/dL — ABNORMAL HIGH (ref 8–23)
CO2: 27 mmol/L (ref 22–32)
Calcium: 9.4 mg/dL (ref 8.9–10.3)
Chloride: 104 mmol/L (ref 98–111)
Creatinine: 1.01 mg/dL — ABNORMAL HIGH (ref 0.44–1.00)
GFR, Estimated: 57 mL/min — ABNORMAL LOW (ref >60)
Glucose, Bld: 87 mg/dL (ref 70–99)
Potassium: 4.4 mmol/L (ref 3.5–5.1)
Sodium: 138 mmol/L (ref 135–145)
Total Bilirubin: 0.4 mg/dL (ref 0.3–1.2)
Total Protein: 6.6 g/dL (ref 6.5–8.1)

## 2022-02-27 LAB — CBC WITH DIFFERENTIAL (CANCER CENTER ONLY)
Abs Immature Granulocytes: 0 10*3/uL (ref 0.00–0.07)
Basophils Absolute: 0 10*3/uL (ref 0.0–0.1)
Basophils Relative: 1 %
Eosinophils Absolute: 0.3 10*3/uL (ref 0.0–0.5)
Eosinophils Relative: 6 %
HCT: 39.1 % (ref 36.0–46.0)
Hemoglobin: 13.1 g/dL (ref 12.0–15.0)
Immature Granulocytes: 0 %
Lymphocytes Relative: 38 %
Lymphs Abs: 1.7 10*3/uL (ref 0.7–4.0)
MCH: 35.7 pg — ABNORMAL HIGH (ref 26.0–34.0)
MCHC: 33.5 g/dL (ref 30.0–36.0)
MCV: 106.5 fL — ABNORMAL HIGH (ref 80.0–100.0)
Monocytes Absolute: 0.4 10*3/uL (ref 0.1–1.0)
Monocytes Relative: 9 %
Neutro Abs: 2.1 10*3/uL (ref 1.7–7.7)
Neutrophils Relative %: 46 %
Platelet Count: 200 10*3/uL (ref 150–400)
RBC: 3.67 MIL/uL — ABNORMAL LOW (ref 3.87–5.11)
RDW: 13.4 % (ref 11.5–15.5)
WBC Count: 4.5 10*3/uL (ref 4.0–10.5)
nRBC: 0 % (ref 0.0–0.2)

## 2022-02-27 LAB — CEA (ACCESS): CEA (CHCC): 1.67 ng/mL (ref 0.00–5.00)

## 2022-02-27 MED ORDER — SODIUM CHLORIDE 0.9 % IV SOLN
7.5000 mg/kg | Freq: Once | INTRAVENOUS | Status: AC
  Administered 2022-02-27: 500 mg via INTRAVENOUS
  Filled 2022-02-27: qty 16

## 2022-02-27 MED ORDER — SODIUM CHLORIDE 0.9 % IV SOLN
1600.0000 mg/m2 | INTRAVENOUS | Status: DC
  Administered 2022-02-27: 2650 mg via INTRAVENOUS
  Filled 2022-02-27: qty 53

## 2022-02-27 MED ORDER — SODIUM CHLORIDE 0.9 % IV SOLN: Freq: Once | INTRAVENOUS | Status: AC

## 2022-02-27 MED ORDER — SODIUM CHLORIDE 0.9% FLUSH
10.0000 mL | INTRAVENOUS | Status: DC | PRN
  Administered 2022-02-27: 10 mL

## 2022-02-27 NOTE — Progress Notes (Signed)
?Kathy Robinson ?OFFICE PROGRESS NOTE ? ? ?Diagnosis: Colon cancer ? ?INTERVAL HISTORY:  ? ?Kathy Robinson complete another cycle of 5-FU/Avastin 02/06/2022.  No mouth sores, diarrhea, bleeding, or symptom of thrombosis.  She feels well.  She continues to have neuropathy symptoms in the lower legs and feet. ? ?Objective: ? ?Vital signs in last 24 hours: ? ?Blood pressure (!) 155/66, pulse 70, temperature 97.8 ?F (36.6 ?C), temperature source Oral, resp. rate 18, height _0  (1.676 m), weight 153 lb 12.8 oz (69.8 kg), last menstrual period 11/19/1996, SpO2 100 %. ?  ? ?HEENT: No thrush or ulcers ?Resp: Lungs clear bilaterally  ?Cardio: Regular rate and rhythm ?GI: Nontender, no hepatosplenomegaly ?Vascular: No leg edema  ?Skin: Palms without erythema ? ?Portacath/PICC-without erythema ? ?Lab Results: ? ?Lab Results  ?Component Value Date  ? WBC 4.5 02/27/2022  ? HGB 13.1 02/27/2022  ? HCT 39.1 02/27/2022  ? MCV 106.5 (H) 02/27/2022  ? PLT 200 02/27/2022  ? NEUTROABS 2.1 02/27/2022  ? ? ?CMP  ?Lab Results  ?Component Value Date  ? NA 140 02/06/2022  ? K 4.4 02/06/2022  ? CL 106 02/06/2022  ? CO2 27 02/06/2022  ? GLUCOSE 83 02/06/2022  ? BUN 29 (H) 02/06/2022  ? CREATININE 1.05 (H) 02/06/2022  ? CALCIUM 9.4 02/06/2022  ? PROT 7.0 02/06/2022  ? ALBUMIN 4.1 02/06/2022  ? AST 22 02/06/2022  ? ALT 16 02/06/2022  ? ALKPHOS 59 02/06/2022  ? BILITOT 0.4 02/06/2022  ? GFRNONAA 54 (L) 02/06/2022  ? GFRAA >60 08/16/2020  ? ? ?Lab Results  ?Component Value Date  ? CEA1 1.16 03/29/2021  ? CEA 1.61 02/06/2022  ? ? ?Medications: I have reviewed the patient's current medications. ? ? ?Assessment/Plan: ? ?Adenocarcinoma the left colon, stage IIIc (pT4b,pN2a), status post a left colectomy 05/30/2020, MSS, TMB 13, BRAF V600E ?Tumor invades the visceral peritoneum, lymphovascular and perineural invasion present, for tumor deposits, 7/13 lymph nodes, negative resection margins, no loss of mismatch repair protein expression ?CT  abdomen/pelvis 05/26/2020-long segment of masslike thickening of the descending colon with associated high-grade colonic obstruction, small colonic wall defect with evidence of a focally contained microperforation, retroperitoneal adenopathy, indeterminate small hypodense liver lesions ?Elevated preoperative CEA ?PET 06/22/2020-hypermetabolic liver metastases, periportal and periaortic adenopathy, hypermetabolic mediastinal and left supraclavicular nodes ?Cycle 1 FOLFOX 07/05/20 ?Cycle 2 FOLFOXIRI (dose-reduced 5FU, no bolus) and Avastin 07/19/20  ?Cycle 3 FOLFOXIRI (Dose reduced 5-FU, no bolus)/Avastin 08/02/2020 ?Cycle 4 FOLFOXIRI/Avastin 08/16/2020 (Udenyca added) ?Cycle 5 FOLFOXIRI/Avastin 08/30/2020 ?CTs 09/09/2020-diminished size of lymph nodes in the chest, abdomen, and pelvis.  Decreased hepatic metastases.  No new evidence of disease progression, fat density lesion surrounding the left hemicolectomy ?Cycle 6 FOLFOXIRI/Avastin 09/13/2020 ?Cycle 7 FOLFOX/Avastin 09/27/2020 (irinotecan held) ?Cycle 8 FOLFOXIRI/Avastin 10/18/2020  ?Cycle 9 FOLFOXIRI/Avastin 11/01/2020 ?Cycle 10 FOLFOXIRI/Avastin 11/22/2020 (oxaliplatin held, irinotecan and 5-FU dose reduced) ?Cycle 11 FOLFOXIRI/Avastin 12/12/2020 (oxaliplatin held) ?CTs 12/19/2020-decrease in size of liver metastases, no evidence of disease progression ?Cycle 12 FOLFOXIRI/Avastin 01/04/2021 (oxaliplatin held) ?Cycle 13 FOLFOXIRI/Avastin 01/24/2021 (oxaliplatin held) ?Cycle 14 FOLFOXIRI/Avastin 02/14/2021 (oxaliplatin held) ?Cycle 15 FOLFOXIRI/Avastin 03/07/2021 (oxaliplatin held) ?Cycle 16 FOLFOXIRI/Avastin 03/29/2021 (oxaliplatin held) ?CTs 04/14/2021- decreased size of liver metastases.  No new or progressive findings. ?Cycle 17 FOLFOXIRI/Avastin 04/19/2021 (oxaliplatin held) ?Cycle 18 FOLFOXIRI/Avastin 05/09/2021 (oxaliplatin held) ?Cycle 19 FOLFOXIRI/Avastin 05/30/2021 (oxaliplatin held) ?Cycle 20 FOLFOXIRI/Avastin 06/20/2021 (oxaliplatin held) ?Cycle 21 FOLFOXIRI/Avastin 07/11/2021  (oxaliplatin held) ?CTs 07/28/2021-unchanged subcentimeter liver lesions, no evidence of new metastatic disease ?Cycle 22 5-FU/ Avastin 08/01/2021 ?Cycle  23 5-FU/Avastin 08/22/2021 ?Cycle 24 5-FU/Avastin 09/12/2021 ?Cycle 25 5-FU/Avastin 10/03/2021 ?Cycle 26 5-FU/Avastin 10/24/2021 ?Cycle 27 5-FU/Avastin 11/15/2021 ?Cycle 28 5-FU/Avastin 12/05/2021 ?CTs 12/22/2021-grossly similar subcentimeter low-attenuation lesions in the liver compatible with treated metastasis.  No evidence for new metastatic disease in the chest, abdomen or pelvis. ?Cycle 29 5-FU/Avastin 12/26/2021 ?Cycle 30 5-FU/Avastin 01/16/2022 ?Cycle 41 5-FU/Avastin 02/06/2022 ?Cycle 42 5-FU/Avastin 02/27/2022 ?  ? ?Atrial fibrillation with rapid ventricular response 05/26/2020 ?Mild neutropenia following cycle 3 FOLFOXIRI, Udenyca added with cycle 4 ?Hypokalemia secondary to diarrhea-potassium supplementation starting 09/13/2020 ?Oxaliplatin neuropathy-moderate loss of vibratory sense on exam 11/22/2020, oxaliplatin held with cycle 10, cycle 11, 12, 13 chemotherapy, improved ?  ?  ? ? ?Disposition: ?Kathy Robinson appears stable.  She continues to tolerate the 5-FU and bevacizumab well.  She will complete another cycle today.  She will return for an office visit and chemotherapy in 3 weeks.  We will plan for a restaging CT evaluation in June. ? ?[name], MD ? ?02/27/2022  ?10:42 AM ? ? ?

## 2022-02-27 NOTE — Patient Instructions (Signed)

## 2022-02-27 NOTE — Patient Instructions (Addendum)
[facility]   ?Discharge Instructions: ?Thank you for choosing [facility] to provide your oncology and hematology care.  ? ?If you have a lab appointment with the [facility], please go directly to the [facility] and check in at the registration area. ?  ?Wear comfortable clothing and clothing appropriate for easy access to any Portacath or PICC line.  ? ?We strive to give you quality time with your provider. You may need to reschedule your appointment if you arrive late (15 or more minutes).  Arriving late affects you and other patients whose appointments are after yours.  Also, if you miss three or more appointments without notifying the office, you may be dismissed from the clinic at the provider?s discretion.    ?  ?For prescription refill requests, have your pharmacy contact our office and allow 72 hours for refills to be completed.   ? ?Today you received the following chemotherapy and/or immunotherapy agents Bevacizumab-bvzr (ZIRABEV) & Flourouracil (ADRUCIL).    ?  ?To help prevent nausea and vomiting after your treatment, we encourage you to take your nausea medication as directed. ? ?BELOW ARE SYMPTOMS THAT SHOULD BE REPORTED IMMEDIATELY: ?*FEVER GREATER THAN 100.4 F (38 ?C) OR HIGHER ?*CHILLS OR SWEATING ?*NAUSEA AND VOMITING THAT IS NOT CONTROLLED WITH YOUR NAUSEA MEDICATION ?*UNUSUAL SHORTNESS OF BREATH ?*UNUSUAL BRUISING OR BLEEDING ?*URINARY PROBLEMS (pain or burning when urinating, or frequent urination) ?*BOWEL PROBLEMS (unusual diarrhea, constipation, pain near the anus) ?TENDERNESS IN MOUTH AND THROAT WITH OR WITHOUT PRESENCE OF ULCERS (sore throat, sores in mouth, or a toothache) ?UNUSUAL RASH, SWELLING OR PAIN  ?UNUSUAL VAGINAL DISCHARGE OR ITCHING  ? ?Items with * indicate a potential emergency and should be followed up as soon as possible or go to the Emergency Department if any problems should occur. ? ?Please show the CHEMOTHERAPY ALERT CARD or  IMMUNOTHERAPY ALERT CARD at check-in to the Emergency Department and triage nurse. ? ?Should you have questions after your visit or need to cancel or reschedule your appointment, please contact [facility]  Dept: [phone]  and follow the prompts.  Office hours are 8:00 a.m. to 4:30 p.m. Monday - Friday. Please note that voicemails left after 4:00 p.m. may not be returned until the following business day.  We are closed weekends and major holidays. You have access to a nurse at all times for urgent questions. Please call the main number to the clinic Dept: [phone] and follow the prompts. ? ? ?For any non-urgent questions, you may also contact your provider using MyChart. We now offer e-Visits for anyone 17 and older to request care online for non-urgent symptoms. For details visit [email or web address]. ?  ?Also download the MyChart app! Go to the app store, search "MyChart", open the app, select [facility], and log in with your MyChart username and password. ? ?Due to Covid, a mask is required upon entering the hospital/clinic. If you do not have a mask, one will be given to you upon arrival. For doctor visits, patients may have 1 support person aged 24 or older with them. For treatment visits, patients cannot have anyone with them due to current Covid guidelines and our immunocompromised population.  ? ?Bevacizumab injection ?What is this medication? ?BEVACIZUMAB (be va SIZ yoo mab) is a monoclonal antibody. It is used to treat many types of cancer. ?This medicine may be used for other purposes; ask your health care provider or pharmacist if you have questions. ?  COMMON BRAND NAME(S): Alymsys, Avastin, MVASI, Zirabev ?What should I tell my care team before I take this medication? ?They need to know if you have any of these conditions: ?diabetes ?heart disease ?high blood pressure ?history of coughing up blood ?prior anthracycline chemotherapy (e.g., doxorubicin, daunorubicin,  epirubicin) ?recent or ongoing radiation therapy ?recent or planning to have surgery ?stroke ?an unusual or allergic reaction to bevacizumab, hamster proteins, mouse proteins, other medicines, foods, dyes, or preservatives ?pregnant or trying to get pregnant ?breast-feeding ?How should I use this medication? ?This medicine is for infusion into a vein. It is given by a health care professional in a hospital or clinic setting. ?Talk to your pediatrician regarding the use of this medicine in children. Special care may be needed. ?Overdosage: If you think you have taken too much of this medicine contact a poison control center or emergency room at once. ?NOTE: This medicine is only for you. Do not share this medicine with others. ?What if I miss a dose? ?It is important not to miss your dose. Call your doctor or health care professional if you are unable to keep an appointment. ?What may interact with this medication? ?Interactions are not expected. ?This list may not describe all possible interactions. Give your health care provider a list of all the medicines, herbs, non-prescription drugs, or dietary supplements you use. Also tell them if you smoke, drink alcohol, or use illegal drugs. Some items may interact with your medicine. ?What should I watch for while using this medication? ?Your condition will be monitored carefully while you are receiving this medicine. You will need important blood work and urine testing done while you are taking this medicine. ?This medicine may increase your risk to bruise or bleed. Call your doctor or health care professional if you notice any unusual bleeding. ?Before having surgery, talk to your health care provider to make sure it is ok. This drug can increase the risk of poor healing of your surgical site or wound. You will need to stop this drug for 28 days before surgery. After surgery, wait at least 28 days before restarting this drug. Make sure the surgical site or wound is  healed enough before restarting this drug. Talk to your health care provider if questions. ?Do not become pregnant while taking this medicine or for 6 months after stopping it. Women should inform their doctor if they wish to become pregnant or think they might be pregnant. There is a potential for serious side effects to an unborn child. Talk to your health care professional or pharmacist for more information. Do not breast-feed an infant while taking this medicine and for 6 months after the last dose. ?This medicine has caused ovarian failure in some women. This medicine may interfere with the ability to have a child. You should talk to your doctor or health care professional if you are concerned about your fertility. ?What side effects may I notice from receiving this medication? ?Side effects that you should report to your doctor or health care professional as soon as possible: ?allergic reactions like skin rash, itching or hives, swelling of the face, lips, or tongue ?chest pain or chest tightness ?chills ?coughing up blood ?high fever ?seizures ?severe constipation ?signs and symptoms of bleeding such as bloody or black, tarry stools; red or dark-brown urine; spitting up blood or brown material that looks like coffee grounds; red spots on the skin; unusual bruising or bleeding from the eye, gums, or nose ?signs and symptoms of a blood  clot such as breathing problems; chest pain; severe, sudden headache; pain, swelling, warmth in the leg ?signs and symptoms of a stroke like changes in vision; confusion; trouble speaking or understanding; severe headaches; sudden numbness or weakness of the face, arm or leg; trouble walking; dizziness; loss of balance or coordination ?stomach pain ?sweating ?swelling of legs or ankles ?vomiting ?weight gain ?Side effects that usually do not require medical attention (report to your doctor or health care professional if they continue or are bothersome): ?back pain ?changes in  taste ?decreased appetite ?dry skin ?nausea ?tiredness ?This list may not describe all possible side effects. Call your doctor for medical advice about side effects. You may report side effects to FDA at 1-8

## 2022-02-27 NOTE — Progress Notes (Signed)
Patient presents for treatment. RN assessment completed along with the following: ? ?Labs/vitals reviewed - Yes, and within treatment parameters.   ?Weight within 10% of previous measurement - Yes ?Informed consent completed and reflects current therapy/intent - Yes, on date 07/19/20             ?Provider progress note reviewed - Yes, today's provider note was reviewed. ?Treatment/Antibody/Supportive plan reviewed - Yes, and there are no adjustments needed for today's treatment. ?S&H and other orders reviewed - Yes, and there are no additional orders identified. ?Previous treatment date reviewed - Yes, and the appropriate amount of time has elapsed between treatments. ?Clinic Hand Off Received from - Merceda Elks, RN ? ?Patient to proceed with treatment.   ?

## 2022-02-27 NOTE — Progress Notes (Signed)
Patient seen by Dr. Sherrill today ? ?Vitals are within treatment parameters. ? ?Labs reviewed by Dr. Sherrill and are within treatment parameters. ? ?Per physician team, patient is ready for treatment and there are NO modifications to the treatment plan.  ?

## 2022-03-01 ENCOUNTER — Inpatient Hospital Stay: Payer: Medicare Other

## 2022-03-01 VITALS — BP 140/80 | HR 85 | Temp 98.3°F | Resp 18

## 2022-03-01 DIAGNOSIS — D709 Neutropenia, unspecified: Secondary | ICD-10-CM | POA: Diagnosis not present

## 2022-03-01 DIAGNOSIS — C787 Secondary malignant neoplasm of liver and intrahepatic bile duct: Secondary | ICD-10-CM | POA: Diagnosis not present

## 2022-03-01 DIAGNOSIS — E876 Hypokalemia: Secondary | ICD-10-CM | POA: Diagnosis not present

## 2022-03-01 DIAGNOSIS — I4891 Unspecified atrial fibrillation: Secondary | ICD-10-CM | POA: Diagnosis not present

## 2022-03-01 DIAGNOSIS — C186 Malignant neoplasm of descending colon: Secondary | ICD-10-CM | POA: Diagnosis not present

## 2022-03-01 DIAGNOSIS — Z5111 Encounter for antineoplastic chemotherapy: Secondary | ICD-10-CM | POA: Diagnosis not present

## 2022-03-01 DIAGNOSIS — C185 Malignant neoplasm of splenic flexure: Secondary | ICD-10-CM

## 2022-03-01 MED ORDER — SODIUM CHLORIDE 0.9% FLUSH
10.0000 mL | INTRAVENOUS | Status: DC | PRN
  Administered 2022-03-01: 10 mL

## 2022-03-01 MED ORDER — HEPARIN SOD (PORK) LOCK FLUSH 100 UNIT/ML IV SOLN
500.0000 [IU] | Freq: Once | INTRAVENOUS | Status: AC | PRN
  Administered 2022-03-01: 500 [IU]

## 2022-03-01 NOTE — Patient Instructions (Signed)

## 2022-03-18 ENCOUNTER — Other Ambulatory Visit: Payer: Self-pay | Admitting: Oncology

## 2022-03-20 ENCOUNTER — Inpatient Hospital Stay: Payer: Medicare Other

## 2022-03-20 ENCOUNTER — Inpatient Hospital Stay: Payer: Medicare Other | Attending: Oncology

## 2022-03-20 ENCOUNTER — Encounter: Payer: Self-pay | Admitting: *Deleted

## 2022-03-20 ENCOUNTER — Encounter: Payer: Self-pay | Admitting: Nurse Practitioner

## 2022-03-20 ENCOUNTER — Inpatient Hospital Stay: Payer: Medicare Other | Admitting: Nurse Practitioner

## 2022-03-20 VITALS — BP 153/76 | HR 85 | Temp 98.2°F | Resp 18 | Ht 66.0 in | Wt 154.8 lb

## 2022-03-20 DIAGNOSIS — C185 Malignant neoplasm of splenic flexure: Secondary | ICD-10-CM

## 2022-03-20 DIAGNOSIS — Z5111 Encounter for antineoplastic chemotherapy: Secondary | ICD-10-CM | POA: Insufficient documentation

## 2022-03-20 DIAGNOSIS — C787 Secondary malignant neoplasm of liver and intrahepatic bile duct: Secondary | ICD-10-CM | POA: Insufficient documentation

## 2022-03-20 DIAGNOSIS — C186 Malignant neoplasm of descending colon: Secondary | ICD-10-CM | POA: Insufficient documentation

## 2022-03-20 DIAGNOSIS — T451X5A Adverse effect of antineoplastic and immunosuppressive drugs, initial encounter: Secondary | ICD-10-CM | POA: Diagnosis not present

## 2022-03-20 DIAGNOSIS — D709 Neutropenia, unspecified: Secondary | ICD-10-CM | POA: Diagnosis not present

## 2022-03-20 DIAGNOSIS — G620 Drug-induced polyneuropathy: Secondary | ICD-10-CM | POA: Diagnosis not present

## 2022-03-20 DIAGNOSIS — R112 Nausea with vomiting, unspecified: Secondary | ICD-10-CM | POA: Diagnosis not present

## 2022-03-20 DIAGNOSIS — I4891 Unspecified atrial fibrillation: Secondary | ICD-10-CM | POA: Insufficient documentation

## 2022-03-20 DIAGNOSIS — E876 Hypokalemia: Secondary | ICD-10-CM | POA: Insufficient documentation

## 2022-03-20 LAB — CMP (CANCER CENTER ONLY)
ALT: 17 U/L (ref 0–44)
AST: 23 U/L (ref 15–41)
Albumin: 3.9 g/dL (ref 3.5–5.0)
Alkaline Phosphatase: 60 U/L (ref 38–126)
Anion gap: 8 (ref 5–15)
BUN: 21 mg/dL (ref 8–23)
CO2: 24 mmol/L (ref 22–32)
Calcium: 8.9 mg/dL (ref 8.9–10.3)
Chloride: 107 mmol/L (ref 98–111)
Creatinine: 0.89 mg/dL (ref 0.44–1.00)
GFR, Estimated: >60 mL/min (ref >60)
Glucose, Bld: 81 mg/dL (ref 70–99)
Potassium: 4.4 mmol/L (ref 3.5–5.1)
Sodium: 139 mmol/L (ref 135–145)
Total Bilirubin: 0.5 mg/dL (ref 0.3–1.2)
Total Protein: 6.7 g/dL (ref 6.5–8.1)

## 2022-03-20 LAB — CBC WITH DIFFERENTIAL (CANCER CENTER ONLY)
Abs Immature Granulocytes: 0.01 10*3/uL (ref 0.00–0.07)
Basophils Absolute: 0 10*3/uL (ref 0.0–0.1)
Basophils Relative: 1 %
Eosinophils Absolute: 0.2 10*3/uL (ref 0.0–0.5)
Eosinophils Relative: 4 %
HCT: 40.1 % (ref 36.0–46.0)
Hemoglobin: 13.2 g/dL (ref 12.0–15.0)
Immature Granulocytes: 0 %
Lymphocytes Relative: 37 %
Lymphs Abs: 1.9 10*3/uL (ref 0.7–4.0)
MCH: 35.3 pg — ABNORMAL HIGH (ref 26.0–34.0)
MCHC: 32.9 g/dL (ref 30.0–36.0)
MCV: 107.2 fL — ABNORMAL HIGH (ref 80.0–100.0)
Monocytes Absolute: 0.4 10*3/uL (ref 0.1–1.0)
Monocytes Relative: 8 %
Neutro Abs: 2.5 10*3/uL (ref 1.7–7.7)
Neutrophils Relative %: 50 %
Platelet Count: 220 10*3/uL (ref 150–400)
RBC: 3.74 MIL/uL — ABNORMAL LOW (ref 3.87–5.11)
RDW: 13.2 % (ref 11.5–15.5)
WBC Count: 5.1 10*3/uL (ref 4.0–10.5)
nRBC: 0 % (ref 0.0–0.2)

## 2022-03-20 LAB — TOTAL PROTEIN, URINE DIPSTICK: Protein, ur: NEGATIVE mg/dL

## 2022-03-20 LAB — CEA (ACCESS): CEA (CHCC): 1.57 ng/mL (ref 0.00–5.00)

## 2022-03-20 MED ORDER — SODIUM CHLORIDE 0.9 % IV SOLN
1600.0000 mg/m2 | INTRAVENOUS | Status: DC
  Administered 2022-03-20: 2650 mg via INTRAVENOUS
  Filled 2022-03-20: qty 53

## 2022-03-20 MED ORDER — SODIUM CHLORIDE 0.9 % IV SOLN: Freq: Once | INTRAVENOUS | Status: AC

## 2022-03-20 MED ORDER — SODIUM CHLORIDE 0.9% FLUSH
10.0000 mL | INTRAVENOUS | Status: DC | PRN
  Administered 2022-03-20: 10 mL

## 2022-03-20 MED ORDER — SODIUM CHLORIDE 0.9 % IV SOLN
7.5000 mg/kg | Freq: Once | INTRAVENOUS | Status: AC
  Administered 2022-03-20: 500 mg via INTRAVENOUS
  Filled 2022-03-20: qty 16

## 2022-03-20 NOTE — Progress Notes (Signed)
Patient seen by Lisa Thomas NP today  Vitals are within treatment parameters.  Labs reviewed by Lisa Thomas NP and are within treatment parameters.  Per physician team, patient is ready for treatment and there are NO modifications to the treatment plan.     

## 2022-03-20 NOTE — Patient Instructions (Signed)

## 2022-03-20 NOTE — Progress Notes (Signed)
?[facility] ?OFFICE PROGRESS NOTE ? ? ?Diagnosis: Colon cancer ? ?INTERVAL HISTORY:  ? ?Ms. Dillavou returns as scheduled.  She completed another cycle of 5-FU/Avastin 02/27/2022.  5 days after the chemotherapy she developed acute nausea/vomiting/diarrhea.  Symptoms resolved over the course of 1 day.  At present she feels well.  No nausea, vomiting, diarrhea since the above-mentioned episode.  No mouth sores.  No bleeding.  Intensity of neuropathy symptoms varies. ? ?Objective: ? ?Vital signs in last 24 hours: ? ?Blood pressure (!) 153/76, pulse 85, temperature 98.2 ?F (36.8 ?C), temperature source Oral, resp. rate 18, height _0  (1.676 m), weight 154 lb 12.8 oz (70.2 kg), last menstrual period 11/19/1996, SpO2 100 %. ?  ? ?HEENT: No thrush or ulcers. ?Resp: Lungs clear bilaterally. ?Cardio: Regular rate and rhythm. ?GI: Abdomen soft and nontender.  No hepatomegaly. ?Vascular: No leg edema. ?Skin: Palms without erythema. ?Port-A-Cath without erythema. ? ? ?Lab Results: ? ?Lab Results  ?Component Value Date  ? WBC 5.1 03/20/2022  ? HGB 13.2 03/20/2022  ? HCT 40.1 03/20/2022  ? MCV 107.2 (H) 03/20/2022  ? PLT 220 03/20/2022  ? NEUTROABS 2.5 03/20/2022  ? ? ?Imaging: ? ?No results found. ? ?Medications: I have reviewed the patient's current medications. ? ?Assessment/Plan: ?Adenocarcinoma the left colon, stage IIIc (pT4b,pN2a), status post a left colectomy 05/30/2020, MSS, TMB 13, BRAF V600E ?Tumor invades the visceral peritoneum, lymphovascular and perineural invasion present, for tumor deposits, 7/13 lymph nodes, negative resection margins, no loss of mismatch repair protein expression ?CT abdomen/pelvis 05/26/2020-long segment of masslike thickening of the descending colon with associated high-grade colonic obstruction, small colonic wall defect with evidence of a focally contained microperforation, retroperitoneal adenopathy, indeterminate small hypodense liver lesions ?Elevated preoperative CEA ?PET  06/22/2020-hypermetabolic liver metastases, periportal and periaortic adenopathy, hypermetabolic mediastinal and left supraclavicular nodes ?Cycle 1 FOLFOX 07/05/20 ?Cycle 2 FOLFOXIRI (dose-reduced 5FU, no bolus) and Avastin 07/19/20  ?Cycle 3 FOLFOXIRI (Dose reduced 5-FU, no bolus)/Avastin 08/02/2020 ?Cycle 4 FOLFOXIRI/Avastin 08/16/2020 (Udenyca added) ?Cycle 5 FOLFOXIRI/Avastin 08/30/2020 ?CTs 09/09/2020-diminished size of lymph nodes in the chest, abdomen, and pelvis.  Decreased hepatic metastases.  No new evidence of disease progression, fat density lesion surrounding the left hemicolectomy ?Cycle 6 FOLFOXIRI/Avastin 09/13/2020 ?Cycle 7 FOLFOX/Avastin 09/27/2020 (irinotecan held) ?Cycle 8 FOLFOXIRI/Avastin 10/18/2020  ?Cycle 9 FOLFOXIRI/Avastin 11/01/2020 ?Cycle 10 FOLFOXIRI/Avastin 11/22/2020 (oxaliplatin held, irinotecan and 5-FU dose reduced) ?Cycle 11 FOLFOXIRI/Avastin 12/12/2020 (oxaliplatin held) ?CTs 12/19/2020-decrease in size of liver metastases, no evidence of disease progression ?Cycle 12 FOLFOXIRI/Avastin 01/04/2021 (oxaliplatin held) ?Cycle 13 FOLFOXIRI/Avastin 01/24/2021 (oxaliplatin held) ?Cycle 14 FOLFOXIRI/Avastin 02/14/2021 (oxaliplatin held) ?Cycle 15 FOLFOXIRI/Avastin 03/07/2021 (oxaliplatin held) ?Cycle 16 FOLFOXIRI/Avastin 03/29/2021 (oxaliplatin held) ?CTs 04/14/2021- decreased size of liver metastases.  No new or progressive findings. ?Cycle 17 FOLFOXIRI/Avastin 04/19/2021 (oxaliplatin held) ?Cycle 18 FOLFOXIRI/Avastin 05/09/2021 (oxaliplatin held) ?Cycle 19 FOLFOXIRI/Avastin 05/30/2021 (oxaliplatin held) ?Cycle 20 FOLFOXIRI/Avastin 06/20/2021 (oxaliplatin held) ?Cycle 21 FOLFOXIRI/Avastin 07/11/2021 (oxaliplatin held) ?CTs 07/28/2021-unchanged subcentimeter liver lesions, no evidence of new metastatic disease ?Cycle 22 5-FU/ Avastin 08/01/2021 ?Cycle 23 5-FU/Avastin 08/22/2021 ?Cycle 24 5-FU/Avastin 09/12/2021 ?Cycle 25 5-FU/Avastin 10/03/2021 ?Cycle 26 5-FU/Avastin 10/24/2021 ?Cycle 27 5-FU/Avastin  11/15/2021 ?Cycle 28 5-FU/Avastin 12/05/2021 ?CTs 12/22/2021-grossly similar subcentimeter low-attenuation lesions in the liver compatible with treated metastasis.  No evidence for new metastatic disease in the chest, abdomen or pelvis. ?Cycle 29 5-FU/Avastin 12/26/2021 ?Cycle 30 5-FU/Avastin 01/16/2022 ?Cycle 41 5-FU/Avastin 02/06/2022 ?Cycle 42 5-FU/Avastin 02/27/2022 ?Cycle 43 5-FU/Avastin 03/20/2022 ?  ?  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  ? ?Disposition: Ms. Forrey appears well.  There is no clinical evidence of disease progression.  Plan to continue 5-FU/Avastin every 3 weeks, cycle 43 today. ? ?CBC and chemistry panel from today reviewed.  Labs adequate to proceed as above. ? ?The episode of acute nausea/vomiting/diarrhea a few weeks ago not likely related to chemotherapy. ? ?She will return for lab, follow-up, 5-FU/Avastin in 3 weeks. ? ? ? ?Ned Card ANP/GNP-BC  ? ?03/20/2022  ?10:18 AM ? ? ? ? ? ? ? ?

## 2022-03-20 NOTE — Patient Instructions (Addendum)
[facility]   ?Discharge Instructions: ?Thank you for choosing [facility] to provide your oncology and hematology care.  ? ?If you have a lab appointment with the [facility], please go directly to the [facility] and check in at the registration area. ?  ?Wear comfortable clothing and clothing appropriate for easy access to any Portacath or PICC line.  ? ?We strive to give you quality time with your provider. You may need to reschedule your appointment if you arrive late (15 or more minutes).  Arriving late affects you and other patients whose appointments are after yours.  Also, if you miss three or more appointments without notifying the office, you may be dismissed from the clinic at the provider?s discretion.    ?  ?For prescription refill requests, have your pharmacy contact our office and allow 72 hours for refills to be completed.   ? ?Today you received the following chemotherapy and/or immunotherapy agents Bevacizumab-bvzr (ZIRABEV) & Flourouracil (ADRUCIL).    ?  ?To help prevent nausea and vomiting after your treatment, we encourage you to take your nausea medication as directed. ? ?BELOW ARE SYMPTOMS THAT SHOULD BE REPORTED IMMEDIATELY: ?*FEVER GREATER THAN 100.4 F (38 ?C) OR HIGHER ?*CHILLS OR SWEATING ?*NAUSEA AND VOMITING THAT IS NOT CONTROLLED WITH YOUR NAUSEA MEDICATION ?*UNUSUAL SHORTNESS OF BREATH ?*UNUSUAL BRUISING OR BLEEDING ?*URINARY PROBLEMS (pain or burning when urinating, or frequent urination) ?*BOWEL PROBLEMS (unusual diarrhea, constipation, pain near the anus) ?TENDERNESS IN MOUTH AND THROAT WITH OR WITHOUT PRESENCE OF ULCERS (sore throat, sores in mouth, or a toothache) ?UNUSUAL RASH, SWELLING OR PAIN  ?UNUSUAL VAGINAL DISCHARGE OR ITCHING  ? ?Items with * indicate a potential emergency and should be followed up as soon as possible or go to the Emergency Department if any problems should occur. ? ?Please show the CHEMOTHERAPY ALERT CARD or  IMMUNOTHERAPY ALERT CARD at check-in to the Emergency Department and triage nurse. ? ?Should you have questions after your visit or need to cancel or reschedule your appointment, please contact [facility]  Dept: [phone]  and follow the prompts.  Office hours are 8:00 a.m. to 4:30 p.m. Monday - Friday. Please note that voicemails left after 4:00 p.m. may not be returned until the following business day.  We are closed weekends and major holidays. You have access to a nurse at all times for urgent questions. Please call the main number to the clinic Dept: [phone] and follow the prompts. ? ? ?For any non-urgent questions, you may also contact your provider using MyChart. We now offer e-Visits for anyone 80 and older to request care online for non-urgent symptoms. For details visit [email or web address]. ?  ?Also download the MyChart app! Go to the app store, search "MyChart", open the app, select [facility], and log in with your MyChart username and password. ? ?Due to Covid, a mask is required upon entering the hospital/clinic. If you do not have a mask, one will be given to you upon arrival. For doctor visits, patients may have 1 support person aged 80 or older with them. For treatment visits, patients cannot have anyone with them due to current Covid guidelines and our immunocompromised population.  ? ?Bevacizumab injection ?What is this medication? ?BEVACIZUMAB (be va SIZ yoo mab) is a monoclonal antibody. It is used to treat many types of cancer. ?This medicine may be used for other purposes; ask your health care provider or pharmacist if you have questions. ?  COMMON BRAND NAME(S): Alymsys, Avastin, MVASI, Zirabev ?What should I tell my care team before I take this medication? ?They need to know if you have any of these conditions: ?diabetes ?heart disease ?high blood pressure ?history of coughing up blood ?prior anthracycline chemotherapy (e.g., doxorubicin, daunorubicin,  epirubicin) ?recent or ongoing radiation therapy ?recent or planning to have surgery ?stroke ?an unusual or allergic reaction to bevacizumab, hamster proteins, mouse proteins, other medicines, foods, dyes, or preservatives ?pregnant or trying to get pregnant ?breast-feeding ?How should I use this medication? ?This medicine is for infusion into a vein. It is given by a health care professional in a hospital or clinic setting. ?Talk to your pediatrician regarding the use of this medicine in children. Special care may be needed. ?Overdosage: If you think you have taken too much of this medicine contact a poison control center or emergency room at once. ?NOTE: This medicine is only for you. Do not share this medicine with others. ?What if I miss a dose? ?It is important not to miss your dose. Call your doctor or health care professional if you are unable to keep an appointment. ?What may interact with this medication? ?Interactions are not expected. ?This list may not describe all possible interactions. Give your health care provider a list of all the medicines, herbs, non-prescription drugs, or dietary supplements you use. Also tell them if you smoke, drink alcohol, or use illegal drugs. Some items may interact with your medicine. ?What should I watch for while using this medication? ?Your condition will be monitored carefully while you are receiving this medicine. You will need important blood work and urine testing done while you are taking this medicine. ?This medicine may increase your risk to bruise or bleed. Call your doctor or health care professional if you notice any unusual bleeding. ?Before having surgery, talk to your health care provider to make sure it is ok. This drug can increase the risk of poor healing of your surgical site or wound. You will need to stop this drug for 28 days before surgery. After surgery, wait at least 28 days before restarting this drug. Make sure the surgical site or wound is  healed enough before restarting this drug. Talk to your health care provider if questions. ?Do not become pregnant while taking this medicine or for 6 months after stopping it. Women should inform their doctor if they wish to become pregnant or think they might be pregnant. There is a potential for serious side effects to an unborn child. Talk to your health care professional or pharmacist for more information. Do not breast-feed an infant while taking this medicine and for 6 months after the last dose. ?This medicine has caused ovarian failure in some women. This medicine may interfere with the ability to have a child. You should talk to your doctor or health care professional if you are concerned about your fertility. ?What side effects may I notice from receiving this medication? ?Side effects that you should report to your doctor or health care professional as soon as possible: ?allergic reactions like skin rash, itching or hives, swelling of the face, lips, or tongue ?chest pain or chest tightness ?chills ?coughing up blood ?high fever ?seizures ?severe constipation ?signs and symptoms of bleeding such as bloody or black, tarry stools; red or dark-brown urine; spitting up blood or brown material that looks like coffee grounds; red spots on the skin; unusual bruising or bleeding from the eye, gums, or nose ?signs and symptoms of a blood  clot such as breathing problems; chest pain; severe, sudden headache; pain, swelling, warmth in the leg ?signs and symptoms of a stroke like changes in vision; confusion; trouble speaking or understanding; severe headaches; sudden numbness or weakness of the face, arm or leg; trouble walking; dizziness; loss of balance or coordination ?stomach pain ?sweating ?swelling of legs or ankles ?vomiting ?weight gain ?Side effects that usually do not require medical attention (report to your doctor or health care professional if they continue or are bothersome): ?back pain ?changes in  taste ?decreased appetite ?dry skin ?nausea ?tiredness ?This list may not describe all possible side effects. Call your doctor for medical advice about side effects. You may report side effects to FDA at 1-8

## 2022-03-20 NOTE — Progress Notes (Signed)
Patient presents for treatment. RN assessment completed along with the following: ? ?Labs/vitals reviewed - Yes, and within treatment parameters.   ?Weight within 10% of previous measurement - Yes ?Informed consent completed and reflects current therapy/intent - Yes, on date 07/19/20             ?Provider progress note reviewed - Today's provider note is not yet available. I reviewed the most recent oncology provider progress note in chart dated 02/27/22. ?Treatment/Antibody/Supportive plan reviewed - Yes, and there are no adjustments needed for today's treatment. ?S&H and other orders reviewed - Yes, and there are no additional orders identified. ?Previous treatment date reviewed - Yes, and the appropriate amount of time has elapsed between treatments. ?Clinic Hand Off Received from - Merceda Elks, RN ? ?Patient to proceed with treatment.   ?

## 2022-03-22 ENCOUNTER — Inpatient Hospital Stay: Payer: Medicare Other

## 2022-03-22 VITALS — BP 139/96 | HR 80 | Temp 97.9°F | Resp 18

## 2022-03-22 DIAGNOSIS — T451X5A Adverse effect of antineoplastic and immunosuppressive drugs, initial encounter: Secondary | ICD-10-CM | POA: Diagnosis not present

## 2022-03-22 DIAGNOSIS — C185 Malignant neoplasm of splenic flexure: Secondary | ICD-10-CM

## 2022-03-22 DIAGNOSIS — R112 Nausea with vomiting, unspecified: Secondary | ICD-10-CM | POA: Diagnosis not present

## 2022-03-22 DIAGNOSIS — C186 Malignant neoplasm of descending colon: Secondary | ICD-10-CM | POA: Diagnosis not present

## 2022-03-22 DIAGNOSIS — D709 Neutropenia, unspecified: Secondary | ICD-10-CM | POA: Diagnosis not present

## 2022-03-22 DIAGNOSIS — Z5111 Encounter for antineoplastic chemotherapy: Secondary | ICD-10-CM | POA: Diagnosis not present

## 2022-03-22 DIAGNOSIS — I4891 Unspecified atrial fibrillation: Secondary | ICD-10-CM | POA: Diagnosis not present

## 2022-03-22 DIAGNOSIS — E876 Hypokalemia: Secondary | ICD-10-CM | POA: Diagnosis not present

## 2022-03-22 DIAGNOSIS — G620 Drug-induced polyneuropathy: Secondary | ICD-10-CM | POA: Diagnosis not present

## 2022-03-22 DIAGNOSIS — C787 Secondary malignant neoplasm of liver and intrahepatic bile duct: Secondary | ICD-10-CM | POA: Diagnosis not present

## 2022-03-22 MED ORDER — HEPARIN SOD (PORK) LOCK FLUSH 100 UNIT/ML IV SOLN
500.0000 [IU] | Freq: Once | INTRAVENOUS | Status: AC | PRN
  Administered 2022-03-22: 500 [IU]

## 2022-03-22 MED ORDER — SODIUM CHLORIDE 0.9% FLUSH
10.0000 mL | INTRAVENOUS | Status: DC | PRN
  Administered 2022-03-22: 10 mL

## 2022-03-22 NOTE — Patient Instructions (Signed)

## 2022-04-03 ENCOUNTER — Other Ambulatory Visit (HOSPITAL_COMMUNITY): Payer: Self-pay

## 2022-04-07 ENCOUNTER — Other Ambulatory Visit: Payer: Self-pay | Admitting: Oncology

## 2022-04-10 ENCOUNTER — Encounter: Payer: Self-pay | Admitting: *Deleted

## 2022-04-10 ENCOUNTER — Inpatient Hospital Stay: Payer: Medicare Other | Admitting: Oncology

## 2022-04-10 ENCOUNTER — Inpatient Hospital Stay: Payer: Medicare Other

## 2022-04-10 VITALS — BP 154/75 | HR 65

## 2022-04-10 VITALS — BP 150/71 | HR 70 | Temp 98.3°F | Resp 16 | Wt 155.2 lb

## 2022-04-10 DIAGNOSIS — T451X5A Adverse effect of antineoplastic and immunosuppressive drugs, initial encounter: Secondary | ICD-10-CM | POA: Diagnosis not present

## 2022-04-10 DIAGNOSIS — C186 Malignant neoplasm of descending colon: Secondary | ICD-10-CM | POA: Diagnosis not present

## 2022-04-10 DIAGNOSIS — D709 Neutropenia, unspecified: Secondary | ICD-10-CM | POA: Diagnosis not present

## 2022-04-10 DIAGNOSIS — I4891 Unspecified atrial fibrillation: Secondary | ICD-10-CM | POA: Diagnosis not present

## 2022-04-10 DIAGNOSIS — C185 Malignant neoplasm of splenic flexure: Secondary | ICD-10-CM

## 2022-04-10 DIAGNOSIS — G620 Drug-induced polyneuropathy: Secondary | ICD-10-CM | POA: Diagnosis not present

## 2022-04-10 DIAGNOSIS — Z5111 Encounter for antineoplastic chemotherapy: Secondary | ICD-10-CM | POA: Diagnosis not present

## 2022-04-10 DIAGNOSIS — C787 Secondary malignant neoplasm of liver and intrahepatic bile duct: Secondary | ICD-10-CM | POA: Diagnosis not present

## 2022-04-10 DIAGNOSIS — R112 Nausea with vomiting, unspecified: Secondary | ICD-10-CM | POA: Diagnosis not present

## 2022-04-10 DIAGNOSIS — E876 Hypokalemia: Secondary | ICD-10-CM | POA: Diagnosis not present

## 2022-04-10 LAB — CBC WITH DIFFERENTIAL (CANCER CENTER ONLY)
Abs Immature Granulocytes: 0.01 10*3/uL (ref 0.00–0.07)
Basophils Absolute: 0 10*3/uL (ref 0.0–0.1)
Basophils Relative: 1 %
Eosinophils Absolute: 0.2 10*3/uL (ref 0.0–0.5)
Eosinophils Relative: 4 %
HCT: 39.4 % (ref 36.0–46.0)
Hemoglobin: 12.8 g/dL (ref 12.0–15.0)
Immature Granulocytes: 0 %
Lymphocytes Relative: 35 %
Lymphs Abs: 1.9 10*3/uL (ref 0.7–4.0)
MCH: 34.7 pg — ABNORMAL HIGH (ref 26.0–34.0)
MCHC: 32.5 g/dL (ref 30.0–36.0)
MCV: 106.8 fL — ABNORMAL HIGH (ref 80.0–100.0)
Monocytes Absolute: 0.5 10*3/uL (ref 0.1–1.0)
Monocytes Relative: 9 %
Neutro Abs: 2.8 10*3/uL (ref 1.7–7.7)
Neutrophils Relative %: 51 %
Platelet Count: 195 10*3/uL (ref 150–400)
RBC: 3.69 MIL/uL — ABNORMAL LOW (ref 3.87–5.11)
RDW: 13.2 % (ref 11.5–15.5)
WBC Count: 5.4 10*3/uL (ref 4.0–10.5)
nRBC: 0 % (ref 0.0–0.2)

## 2022-04-10 LAB — CMP (CANCER CENTER ONLY)
ALT: 16 U/L (ref 0–44)
AST: 22 U/L (ref 15–41)
Albumin: 4 g/dL (ref 3.5–5.0)
Alkaline Phosphatase: 68 U/L (ref 38–126)
Anion gap: 6 (ref 5–15)
BUN: 25 mg/dL — ABNORMAL HIGH (ref 8–23)
CO2: 27 mmol/L (ref 22–32)
Calcium: 9.6 mg/dL (ref 8.9–10.3)
Chloride: 105 mmol/L (ref 98–111)
Creatinine: 1.06 mg/dL — ABNORMAL HIGH (ref 0.44–1.00)
GFR, Estimated: 53 mL/min — ABNORMAL LOW (ref >60)
Glucose, Bld: 89 mg/dL (ref 70–99)
Potassium: 4.6 mmol/L (ref 3.5–5.1)
Sodium: 138 mmol/L (ref 135–145)
Total Bilirubin: 0.4 mg/dL (ref 0.3–1.2)
Total Protein: 6.9 g/dL (ref 6.5–8.1)

## 2022-04-10 LAB — CEA (ACCESS): CEA (CHCC): 1.41 ng/mL (ref 0.00–5.00)

## 2022-04-10 LAB — TOTAL PROTEIN, URINE DIPSTICK: Protein, ur: NEGATIVE mg/dL

## 2022-04-10 MED ORDER — SODIUM CHLORIDE 0.9% FLUSH
10.0000 mL | INTRAVENOUS | Status: DC | PRN
  Administered 2022-04-10: 10 mL

## 2022-04-10 MED ORDER — SODIUM CHLORIDE 0.9 % IV SOLN
7.5000 mg/kg | Freq: Once | INTRAVENOUS | Status: AC
  Administered 2022-04-10: 500 mg via INTRAVENOUS
  Filled 2022-04-10: qty 16

## 2022-04-10 MED ORDER — SODIUM CHLORIDE 0.9 % IV SOLN
1600.0000 mg/m2 | INTRAVENOUS | Status: DC
  Administered 2022-04-10: 2650 mg via INTRAVENOUS
  Filled 2022-04-10: qty 53

## 2022-04-10 MED ORDER — SODIUM CHLORIDE 0.9 % IV SOLN: Freq: Once | INTRAVENOUS | Status: AC

## 2022-04-10 NOTE — Patient Instructions (Signed)

## 2022-04-10 NOTE — Progress Notes (Signed)
Patient seen by Dr. Sherrill today ? ?Vitals are within treatment parameters. ? ?Labs reviewed by Dr. Sherrill and are within treatment parameters. ? ?Per physician team, patient is ready for treatment and there are NO modifications to the treatment plan.  ?

## 2022-04-10 NOTE — Patient Instructions (Addendum)
Kathy Robinson   Discharge Instructions: Thank you for choosing [facility] to provide your oncology and hematology care.   If you have a lab appointment with the [facility], please go directly to the [facility] and check in at the registration area.   Wear comfortable clothing and clothing appropriate for easy access to any Portacath or PICC line.   We strive to give you quality time with your provider. You may need to reschedule your appointment if you arrive late (15 or more minutes).  Arriving late affects you and other patients whose appointments are after yours.  Also, if you miss three or more appointments without notifying the office, you may be dismissed from the clinic at the provider's discretion.      For prescription refill requests, have your pharmacy contact our office and allow 72 hours for refills to be completed.    Today you received the following chemotherapy and/or immunotherapy agents Bevacizumab-bvzr (ZIRABEV) & Flourouracil (ADRUCIL).      To help prevent nausea and vomiting after your treatment, we encourage you to take your nausea medication as directed.  BELOW ARE SYMPTOMS THAT SHOULD BE REPORTED IMMEDIATELY: *FEVER GREATER THAN 100.4 F (38 C) OR HIGHER *CHILLS OR SWEATING *NAUSEA AND VOMITING THAT IS NOT CONTROLLED WITH YOUR NAUSEA MEDICATION *UNUSUAL SHORTNESS OF BREATH *UNUSUAL BRUISING OR BLEEDING *URINARY PROBLEMS (pain or burning when urinating, or frequent urination) *BOWEL PROBLEMS (unusual diarrhea, constipation, pain near the anus) TENDERNESS IN MOUTH AND THROAT WITH OR WITHOUT PRESENCE OF ULCERS (sore throat, sores in mouth, or a toothache) UNUSUAL RASH, SWELLING OR PAIN  UNUSUAL VAGINAL DISCHARGE OR ITCHING   Items with * indicate a potential emergency and should be followed up as soon as possible or go to the Emergency Department if any problems should occur.  Please show the CHEMOTHERAPY ALERT CARD or  IMMUNOTHERAPY ALERT CARD at check-in to the Emergency Department and triage nurse.  Should you have questions after your visit or need to cancel or reschedule your appointment, please contact [facility]  Dept: [phone]  and follow the prompts.  Office hours are 8:00 a.m. to 4:30 p.m. Monday - Friday. Please note that voicemails left after 4:00 p.m. may not be returned until the following business day.  We are closed weekends and major holidays. You have access to a nurse at all times for urgent questions. Please call the main number to the clinic Dept: [phone] and follow the prompts.   For any non-urgent questions, you may also contact your provider using MyChart. We now offer e-Visits for anyone 76 and older to request care online for non-urgent symptoms. For details visit [email or web address].   Also download the MyChart app! Go to the app store, search "MyChart", open the app, select [facility], and log in with your MyChart username and password.  Due to Covid, a mask is required upon entering the hospital/clinic. If you do not have a mask, one will be given to you upon arrival. For doctor visits, patients may have 1 support person aged 32 or older with them. For treatment visits, patients cannot have anyone with them due to current Covid guidelines and our immunocompromised population.   Bevacizumab injection What is this medication? BEVACIZUMAB (be va SIZ yoo mab) is a monoclonal antibody. It is used to treat many types of cancer. This medicine may be used for other purposes; ask your health care provider or pharmacist if you have questions.  COMMON BRAND NAME(S): Alymsys, Avastin, MVASI, [name] What should I tell my care team before I take this medication? They need to know if you have any of these conditions: diabetes heart disease high blood pressure history of coughing up blood prior anthracycline chemotherapy (e.g., doxorubicin, daunorubicin,  epirubicin) recent or ongoing radiation therapy recent or planning to have surgery stroke an unusual or allergic reaction to bevacizumab, hamster proteins, mouse proteins, other medicines, foods, dyes, or preservatives pregnant or trying to get pregnant breast-feeding How should I use this medication? This medicine is for infusion into a vein. It is given by a health care professional in a hospital or clinic setting. Talk to your pediatrician regarding the use of this medicine in children. Special care may be needed. Overdosage: If you think you have taken too much of this medicine contact a poison control center or emergency room at once. NOTE: This medicine is only for you. Do not share this medicine with others. What if I miss a dose? It is important not to miss your dose. Call your doctor or health care professional if you are unable to keep an appointment. What may interact with this medication? Interactions are not expected. This list may not describe all possible interactions. Give your health care provider a list of all the medicines, herbs, non-prescription drugs, or dietary supplements you use. Also tell them if you smoke, drink alcohol, or use illegal drugs. Some items may interact with your medicine. What should I watch for while using this medication? Your condition will be monitored carefully while you are receiving this medicine. You will need important blood work and urine testing done while you are taking this medicine. This medicine may increase your risk to bruise or bleed. Call your doctor or health care professional if you notice any unusual bleeding. Before having surgery, talk to your health care provider to make sure it is ok. This drug can increase the risk of poor healing of your surgical site or wound. You will need to stop this drug for 28 days before surgery. After surgery, wait at least 28 days before restarting this drug. Make sure the surgical site or wound is  healed enough before restarting this drug. Talk to your health care provider if questions. Do not become pregnant while taking this medicine or for 6 months after stopping it. Women should inform their doctor if they wish to become pregnant or think they might be pregnant. There is a potential for serious side effects to an unborn child. Talk to your health care professional or pharmacist for more information. Do not breast-feed an infant while taking this medicine and for 6 months after the last dose. This medicine has caused ovarian failure in some women. This medicine may interfere with the ability to have a child. You should talk to your doctor or health care professional if you are concerned about your fertility. What side effects may I notice from receiving this medication? Side effects that you should report to your doctor or health care professional as soon as possible: allergic reactions like skin rash, itching or hives, swelling of the face, lips, or tongue chest pain or chest tightness chills coughing up blood high fever seizures severe constipation signs and symptoms of bleeding such as bloody or black, tarry stools; red or dark-brown urine; spitting up blood or brown material that looks like coffee grounds; red spots on the skin; unusual bruising or bleeding from the eye, gums, or nose signs and symptoms of a blood  clot such as breathing problems; chest pain; severe, sudden headache; pain, swelling, warmth in the leg signs and symptoms of a stroke like changes in vision; confusion; trouble speaking or understanding; severe headaches; sudden numbness or weakness of the face, arm or leg; trouble walking; dizziness; loss of balance or coordination stomach pain sweating swelling of legs or ankles vomiting weight gain Side effects that usually do not require medical attention (report to your doctor or health care professional if they continue or are bothersome): back pain changes in  taste decreased appetite dry skin nausea tiredness This list may not describe all possible side effects. Call your doctor for medical advice about side effects. You may report side effects to FDA at 1-800-FDA-1088. Where should I keep my medication? This drug is given in a hospital or clinic and will not be stored at home. NOTE: This sheet is a summary. It may not cover all possible information. If you have questions about this medicine, talk to your doctor, pharmacist, or health care provider.  2022 Elsevier/Gold Standard (2021-07-25 00:00:00)  Fluorouracil, 5-FU injection What is this medication? FLUOROURACIL, 5-FU (flure oh YOOR a sil) is a chemotherapy drug. It slows the growth of cancer cells. This medicine is used to treat many types of cancer like breast cancer, colon or rectal cancer, pancreatic cancer, and stomach cancer. This medicine may be used for other purposes; ask your health care provider or pharmacist if you have questions. COMMON BRAND NAME(S): Adrucil What should I tell my care team before I take this medication? They need to know if you have any of these conditions: blood disorders dihydropyrimidine dehydrogenase (DPD) deficiency infection (especially a virus infection such as chickenpox, cold sores, or herpes) kidney disease liver disease malnourished, poor nutrition recent or ongoing radiation therapy an unusual or allergic reaction to fluorouracil, other chemotherapy, other medicines, foods, dyes, or preservatives pregnant or trying to get pregnant breast-feeding How should I use this medication? This drug is given as an infusion or injection into a vein. It is administered in a hospital or clinic by a specially trained health care professional. Talk to your pediatrician regarding the use of this medicine in children. Special care may be needed. Overdosage: If you think you have taken too much of this medicine contact a poison control center or emergency room  at once. NOTE: This medicine is only for you. Do not share this medicine with others. What if I miss a dose? It is important not to miss your dose. Call your doctor or health care professional if you are unable to keep an appointment. What may interact with this medication? Do not take this medicine with any of the following medications: live virus vaccines This medicine may also interact with the following medications: medicines that treat or prevent blood clots like warfarin, enoxaparin, and dalteparin This list may not describe all possible interactions. Give your health care provider a list of all the medicines, herbs, non-prescription drugs, or dietary supplements you use. Also tell them if you smoke, drink alcohol, or use illegal drugs. Some items may interact with your medicine. What should I watch for while using this medication? Visit your doctor for checks on your progress. This drug may make you feel generally unwell. This is not uncommon, as chemotherapy can affect healthy cells as well as cancer cells. Report any side effects. Continue your course of treatment even though you feel ill unless your doctor tells you to stop. In some cases, you may be given additional medicines  to help with side effects. Follow all directions for their use. Call your doctor or health care professional for advice if you get a fever, chills or sore throat, or other symptoms of a cold or flu. Do not treat yourself. This drug decreases your body's ability to fight infections. Try to avoid being around people who are sick. This medicine may increase your risk to bruise or bleed. Call your doctor or health care professional if you notice any unusual bleeding. Be careful brushing and flossing your teeth or using a toothpick because you may get an infection or bleed more easily. If you have any dental work done, tell your dentist you are receiving this medicine. Avoid taking products that contain aspirin,  acetaminophen, ibuprofen, naproxen, or ketoprofen unless instructed by your doctor. These medicines may hide a fever. Do not become pregnant while taking this medicine. Women should inform their doctor if they wish to become pregnant or think they might be pregnant. There is a potential for serious side effects to an unborn child. Talk to your health care professional or pharmacist for more information. Do not breast-feed an infant while taking this medicine. Men should inform their doctor if they wish to father a child. This medicine may lower sperm counts. Do not treat diarrhea with over the counter products. Contact your doctor if you have diarrhea that lasts more than 2 days or if it is severe and watery. This medicine can make you more sensitive to the sun. Keep out of the sun. If you cannot avoid being in the sun, wear protective clothing and use sunscreen. Do not use sun lamps or tanning beds/booths. What side effects may I notice from receiving this medication? Side effects that you should report to your doctor or health care professional as soon as possible: allergic reactions like skin rash, itching or hives, swelling of the face, lips, or tongue low blood counts - this medicine may decrease the number of white blood cells, red blood cells and platelets. You may be at increased risk for infections and bleeding. signs of infection - fever or chills, cough, sore throat, pain or difficulty passing urine signs of decreased platelets or bleeding - bruising, pinpoint red spots on the skin, black, tarry stools, blood in the urine signs of decreased red blood cells - unusually weak or tired, fainting spells, lightheadedness breathing problems changes in vision chest pain mouth sores nausea and vomiting pain, swelling, redness at site where injected pain, tingling, numbness in the hands or feet redness, swelling, or sores on hands or feet stomach pain unusual bleeding Side effects that usually  do not require medical attention (report to your doctor or health care professional if they continue or are bothersome): changes in finger or toe nails diarrhea dry or itchy skin hair loss headache loss of appetite sensitivity of eyes to the light stomach upset unusually teary eyes This list may not describe all possible side effects. Call your doctor for medical advice about side effects. You may report side effects to FDA at 1-800-FDA-1088. Where should I keep my medication? This drug is given in a hospital or clinic and will not be stored at home. NOTE: This sheet is a summary. It may not cover all possible information. If you have questions about this medicine, talk to your doctor, pharmacist, or health care provider.  2022 Elsevier/Gold Standard (2021-07-25 00:00:00)  The chemotherapy medication bag should finish at 46 hours, 96 hours, or 7 days. For example, if your pump is scheduled  for 46 hours and it was put on at 4:00 p.m., it should finish at 2:00 p.m. the day it is scheduled to come off regardless of your appointment time.     Estimated time to finish at  12p.m. on Thursday 04/12/2022.   If the display on your pump reads "Low Volume" and it is beeping, take the batteries out of the pump and come to the cancer center for it to be taken off.   If the pump alarms go off prior to the pump reading "Low Volume" then call [phone] and someone can assist you.  If the plunger comes out and the chemotherapy medication is leaking out, please use your home chemo spill kit to clean up the spill. Do NOT use paper towels or other household products.  If you have problems or questions regarding your pump, please call either [phone] (24 hours a day) or the cancer center Monday-Friday 8:00 a.m.- 4:30 p.m. at the clinic number and we will assist you. If you are unable to get assistance, then go to the nearest Emergency Department and ask the staff to contact the IV team for  assistance.

## 2022-04-10 NOTE — Progress Notes (Signed)
Kathy Robinson OFFICE PROGRESS NOTE   Diagnosis: Colon cancer  INTERVAL HISTORY:   Kathy Robinson completed on cycle 5-fluorouracil 03/20/2022.  No mouth sores or diarrhea.  She feels well.  Good appetite.  She had increased leg edema after increasing salt in her diet.  Stable neuropathy symptoms.  Objective:  Vital signs in last 24 hours:  Blood pressure (!) 150/71, pulse 70, temperature 98.3 F (36.8 C), resp. rate 16, weight 155 lb 3.2 oz (70.4 kg), last menstrual period 11/19/1996, SpO2 100 %.    HEENT: No thrush or ulcers Resp: Lungs clear bilaterally Cardio: Regular rate and rhythm GI: No hepatosplenomegaly Vascular: No leg edema     Portacath/PICC-without erythema  Lab Results:  Lab Results  Component Value Date   WBC 5.1 03/20/2022   HGB 13.2 03/20/2022   HCT 40.1 03/20/2022   MCV 107.2 (H) 03/20/2022   PLT 220 03/20/2022   NEUTROABS 2.5 03/20/2022    CMP  Lab Results  Component Value Date   NA 139 03/20/2022   K 4.4 03/20/2022   CL 107 03/20/2022   CO2 24 03/20/2022   GLUCOSE 81 03/20/2022   BUN 21 03/20/2022   CREATININE 0.89 03/20/2022   CALCIUM 8.9 03/20/2022   PROT 6.7 03/20/2022   ALBUMIN 3.9 03/20/2022   AST 23 03/20/2022   ALT 17 03/20/2022   ALKPHOS 60 03/20/2022   BILITOT 0.5 03/20/2022   GFRNONAA >60 03/20/2022   GFRAA >60 08/16/2020    Lab Results  Component Value Date   CEA1 1.16 03/29/2021   CEA 1.57 03/20/2022     Medications: I have reviewed the patient's current medications.   Assessment/Plan: Adenocarcinoma the left colon, stage IIIc (pT4b,pN2a), status post a left colectomy 05/30/2020, MSS, TMB 13, BRAF V600E Tumor invades the visceral peritoneum, lymphovascular and perineural invasion present, for tumor deposits, 7/13 lymph nodes, negative resection margins, no loss of mismatch repair protein expression CT abdomen/pelvis 05/26/2020-long segment of masslike thickening of the descending colon with associated  high-grade colonic obstruction, small colonic wall defect with evidence of a focally contained microperforation, retroperitoneal adenopathy, indeterminate small hypodense liver lesions Elevated preoperative CEA PET 06/22/2020-hypermetabolic liver metastases, periportal and periaortic adenopathy, hypermetabolic mediastinal and left supraclavicular nodes Cycle 1 FOLFOX 07/05/20 Cycle 2 FOLFOXIRI (dose-reduced 5FU, no bolus) and Avastin 07/19/20  Cycle 3 FOLFOXIRI (Dose reduced 5-FU, no bolus)/Avastin 08/02/2020 Cycle 4 FOLFOXIRI/Avastin 08/16/2020 (Udenyca added) Cycle 5 FOLFOXIRI/Avastin 08/30/2020 CTs 09/09/2020-diminished size of lymph nodes in the chest, abdomen, and pelvis.  Decreased hepatic metastases.  No new evidence of disease progression, fat density lesion surrounding the left hemicolectomy Cycle 6 FOLFOXIRI/Avastin 09/13/2020 Cycle 7 FOLFOX/Avastin 09/27/2020 (irinotecan held) Cycle 8 FOLFOXIRI/Avastin 10/18/2020  Cycle 9 FOLFOXIRI/Avastin 11/01/2020 Cycle 10 FOLFOXIRI/Avastin 11/22/2020 (oxaliplatin held, irinotecan and 5-FU dose reduced) Cycle 11 FOLFOXIRI/Avastin 12/12/2020 (oxaliplatin held) CTs 12/19/2020-decrease in size of liver metastases, no evidence of disease progression Cycle 12 FOLFOXIRI/Avastin 01/04/2021 (oxaliplatin held) Cycle 13 FOLFOXIRI/Avastin 01/24/2021 (oxaliplatin held) Cycle 14 FOLFOXIRI/Avastin 02/14/2021 (oxaliplatin held) Cycle 15 FOLFOXIRI/Avastin 03/07/2021 (oxaliplatin held) Cycle 16 FOLFOXIRI/Avastin 03/29/2021 (oxaliplatin held) CTs 04/14/2021- decreased size of liver metastases.  No new or progressive findings. Cycle 17 FOLFOXIRI/Avastin 04/19/2021 (oxaliplatin held) Cycle 18 FOLFOXIRI/Avastin 05/09/2021 (oxaliplatin held) Cycle 19 FOLFOXIRI/Avastin 05/30/2021 (oxaliplatin held) Cycle 20 FOLFOXIRI/Avastin 06/20/2021 (oxaliplatin held) Cycle 21 FOLFOXIRI/Avastin 07/11/2021 (oxaliplatin held) CTs 07/28/2021-unchanged subcentimeter liver lesions, no evidence of new  metastatic disease Cycle 22 5-FU/ Avastin 08/01/2021 Cycle 23 5-FU/Avastin 08/22/2021 Cycle 24 5-FU/Avastin 09/12/2021 Cycle 25 5-FU/Avastin 10/03/2021 Cycle 26 5-FU/Avastin  10/24/2021 Cycle 27 5-FU/Avastin 11/15/2021 Cycle 28 5-FU/Avastin 12/05/2021 CTs 12/22/2021-grossly similar subcentimeter low-attenuation lesions in the liver compatible with treated metastasis.  No evidence for new metastatic disease in the chest, abdomen or pelvis. 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 appears stable.  She continues to tolerate the 5-fluorouracil/Avastin well.  She will complete another cycle today.  She will undergo a restaging CT evaluation prior to an office visit in 3 weeks.  [name], MD  04/10/2022  10:41 AM

## 2022-04-12 ENCOUNTER — Inpatient Hospital Stay: Payer: Medicare Other

## 2022-04-12 VITALS — BP 131/88 | HR 95 | Temp 98.2°F | Resp 18

## 2022-04-12 DIAGNOSIS — R112 Nausea with vomiting, unspecified: Secondary | ICD-10-CM | POA: Diagnosis not present

## 2022-04-12 DIAGNOSIS — Z5111 Encounter for antineoplastic chemotherapy: Secondary | ICD-10-CM | POA: Diagnosis not present

## 2022-04-12 DIAGNOSIS — D709 Neutropenia, unspecified: Secondary | ICD-10-CM | POA: Diagnosis not present

## 2022-04-12 DIAGNOSIS — C787 Secondary malignant neoplasm of liver and intrahepatic bile duct: Secondary | ICD-10-CM | POA: Diagnosis not present

## 2022-04-12 DIAGNOSIS — G620 Drug-induced polyneuropathy: Secondary | ICD-10-CM | POA: Diagnosis not present

## 2022-04-12 DIAGNOSIS — C185 Malignant neoplasm of splenic flexure: Secondary | ICD-10-CM

## 2022-04-12 DIAGNOSIS — I4891 Unspecified atrial fibrillation: Secondary | ICD-10-CM | POA: Diagnosis not present

## 2022-04-12 DIAGNOSIS — C186 Malignant neoplasm of descending colon: Secondary | ICD-10-CM | POA: Diagnosis not present

## 2022-04-12 DIAGNOSIS — E876 Hypokalemia: Secondary | ICD-10-CM | POA: Diagnosis not present

## 2022-04-12 DIAGNOSIS — T451X5A Adverse effect of antineoplastic and immunosuppressive drugs, initial encounter: Secondary | ICD-10-CM | POA: Diagnosis not present

## 2022-04-12 MED ORDER — SODIUM CHLORIDE 0.9% FLUSH
10.0000 mL | INTRAVENOUS | Status: DC | PRN
  Administered 2022-04-12: 10 mL

## 2022-04-12 MED ORDER — HEPARIN SOD (PORK) LOCK FLUSH 100 UNIT/ML IV SOLN
500.0000 [IU] | Freq: Once | INTRAVENOUS | Status: AC | PRN
  Administered 2022-04-12: 500 [IU]

## 2022-04-12 NOTE — Patient Instructions (Signed)

## 2022-04-13 ENCOUNTER — Telehealth: Payer: Self-pay | Admitting: *Deleted

## 2022-04-13 NOTE — Telephone Encounter (Signed)
Made patient aware of CT on 04/27/22 at 0830/0900 at [facility]. NPO 4 hours prior and contrast at 0700 and 0800. She verbalized understanding.

## 2022-04-23 DIAGNOSIS — H5213 Myopia, bilateral: Secondary | ICD-10-CM | POA: Diagnosis not present

## 2022-04-23 DIAGNOSIS — H25813 Combined forms of age-related cataract, bilateral: Secondary | ICD-10-CM | POA: Diagnosis not present

## 2022-04-27 ENCOUNTER — Ambulatory Visit (HOSPITAL_COMMUNITY)
Admission: RE | Admit: 2022-04-27 | Discharge: 2022-04-27 | Disposition: A | Discharge status: Home / Self Care | Payer: Medicare Other | Source: Ambulatory Visit | Attending: Oncology | Admitting: Oncology

## 2022-04-27 ENCOUNTER — Other Ambulatory Visit: Payer: Self-pay | Admitting: Nurse Practitioner

## 2022-04-27 DIAGNOSIS — K769 Liver disease, unspecified: Secondary | ICD-10-CM | POA: Diagnosis not present

## 2022-04-27 DIAGNOSIS — C185 Malignant neoplasm of splenic flexure: Secondary | ICD-10-CM | POA: Diagnosis not present

## 2022-04-27 DIAGNOSIS — R911 Solitary pulmonary nodule: Secondary | ICD-10-CM | POA: Diagnosis not present

## 2022-04-27 DIAGNOSIS — C189 Malignant neoplasm of colon, unspecified: Secondary | ICD-10-CM | POA: Diagnosis not present

## 2022-04-27 MED ORDER — IOHEXOL 300 MG/ML  SOLN
100.0000 mL | Freq: Once | INTRAMUSCULAR | Status: AC | PRN
  Administered 2022-04-27: 100 mL via INTRAVENOUS

## 2022-04-27 MED ORDER — SODIUM CHLORIDE (PF) 0.9 % IJ SOLN
INTRAMUSCULAR | Status: AC
  Filled 2022-04-27: qty 50

## 2022-04-29 ENCOUNTER — Other Ambulatory Visit: Payer: Self-pay | Admitting: Oncology

## 2022-05-01 ENCOUNTER — Inpatient Hospital Stay: Payer: Medicare Other | Admitting: Nurse Practitioner

## 2022-05-01 ENCOUNTER — Encounter: Payer: Self-pay | Admitting: Nurse Practitioner

## 2022-05-01 ENCOUNTER — Inpatient Hospital Stay: Payer: Medicare Other | Attending: Oncology

## 2022-05-01 ENCOUNTER — Other Ambulatory Visit: Payer: Self-pay

## 2022-05-01 ENCOUNTER — Other Ambulatory Visit (HOSPITAL_BASED_OUTPATIENT_CLINIC_OR_DEPARTMENT_OTHER): Payer: Self-pay

## 2022-05-01 ENCOUNTER — Inpatient Hospital Stay: Payer: Medicare Other

## 2022-05-01 VITALS — BP 140/67 | HR 61

## 2022-05-01 VITALS — BP 159/82 | HR 80 | Temp 97.8°F | Resp 18 | Ht 66.0 in | Wt 154.0 lb

## 2022-05-01 DIAGNOSIS — Z5112 Encounter for antineoplastic immunotherapy: Secondary | ICD-10-CM | POA: Diagnosis not present

## 2022-05-01 DIAGNOSIS — E876 Hypokalemia: Secondary | ICD-10-CM | POA: Insufficient documentation

## 2022-05-01 DIAGNOSIS — T451X5A Adverse effect of antineoplastic and immunosuppressive drugs, initial encounter: Secondary | ICD-10-CM | POA: Insufficient documentation

## 2022-05-01 DIAGNOSIS — D701 Agranulocytosis secondary to cancer chemotherapy: Secondary | ICD-10-CM | POA: Diagnosis not present

## 2022-05-01 DIAGNOSIS — C186 Malignant neoplasm of descending colon: Secondary | ICD-10-CM | POA: Insufficient documentation

## 2022-05-01 DIAGNOSIS — C185 Malignant neoplasm of splenic flexure: Secondary | ICD-10-CM

## 2022-05-01 DIAGNOSIS — R918 Other nonspecific abnormal finding of lung field: Secondary | ICD-10-CM | POA: Insufficient documentation

## 2022-05-01 DIAGNOSIS — C787 Secondary malignant neoplasm of liver and intrahepatic bile duct: Secondary | ICD-10-CM | POA: Diagnosis not present

## 2022-05-01 DIAGNOSIS — I4891 Unspecified atrial fibrillation: Secondary | ICD-10-CM | POA: Insufficient documentation

## 2022-05-01 DIAGNOSIS — R197 Diarrhea, unspecified: Secondary | ICD-10-CM | POA: Insufficient documentation

## 2022-05-01 DIAGNOSIS — G629 Polyneuropathy, unspecified: Secondary | ICD-10-CM | POA: Diagnosis not present

## 2022-05-01 DIAGNOSIS — Z5111 Encounter for antineoplastic chemotherapy: Secondary | ICD-10-CM | POA: Diagnosis not present

## 2022-05-01 LAB — CMP (CANCER CENTER ONLY)
ALT: 15 U/L (ref 0–44)
AST: 21 U/L (ref 15–41)
Albumin: 4 g/dL (ref 3.5–5.0)
Alkaline Phosphatase: 54 U/L (ref 38–126)
Anion gap: 8 (ref 5–15)
BUN: 23 mg/dL (ref 8–23)
CO2: 27 mmol/L (ref 22–32)
Calcium: 9.8 mg/dL (ref 8.9–10.3)
Chloride: 104 mmol/L (ref 98–111)
Creatinine: 1.02 mg/dL — ABNORMAL HIGH (ref 0.44–1.00)
GFR, Estimated: 56 mL/min — ABNORMAL LOW (ref >60)
Glucose, Bld: 97 mg/dL (ref 70–99)
Potassium: 4.6 mmol/L (ref 3.5–5.1)
Sodium: 139 mmol/L (ref 135–145)
Total Bilirubin: 0.5 mg/dL (ref 0.3–1.2)
Total Protein: 6.8 g/dL (ref 6.5–8.1)

## 2022-05-01 LAB — CBC WITH DIFFERENTIAL (CANCER CENTER ONLY)
Abs Immature Granulocytes: 0.01 10*3/uL (ref 0.00–0.07)
Basophils Absolute: 0 10*3/uL (ref 0.0–0.1)
Basophils Relative: 1 %
Eosinophils Absolute: 0.4 10*3/uL (ref 0.0–0.5)
Eosinophils Relative: 8 %
HCT: 39.8 % (ref 36.0–46.0)
Hemoglobin: 13.3 g/dL (ref 12.0–15.0)
Immature Granulocytes: 0 %
Lymphocytes Relative: 34 %
Lymphs Abs: 1.6 10*3/uL (ref 0.7–4.0)
MCH: 35.6 pg — ABNORMAL HIGH (ref 26.0–34.0)
MCHC: 33.4 g/dL (ref 30.0–36.0)
MCV: 106.4 fL — ABNORMAL HIGH (ref 80.0–100.0)
Monocytes Absolute: 0.5 10*3/uL (ref 0.1–1.0)
Monocytes Relative: 11 %
Neutro Abs: 2.3 10*3/uL (ref 1.7–7.7)
Neutrophils Relative %: 46 %
Platelet Count: 192 10*3/uL (ref 150–400)
RBC: 3.74 MIL/uL — ABNORMAL LOW (ref 3.87–5.11)
RDW: 13.2 % (ref 11.5–15.5)
WBC Count: 4.8 10*3/uL (ref 4.0–10.5)
nRBC: 0 % (ref 0.0–0.2)

## 2022-05-01 LAB — CEA (ACCESS): CEA (CHCC): 1.66 ng/mL (ref 0.00–5.00)

## 2022-05-01 MED ORDER — SODIUM CHLORIDE 0.9 % IV SOLN
7.5000 mg/kg | Freq: Once | INTRAVENOUS | Status: AC
  Administered 2022-05-01: 500 mg via INTRAVENOUS
  Filled 2022-05-01: qty 16

## 2022-05-01 MED ORDER — SODIUM CHLORIDE 0.9% FLUSH
10.0000 mL | INTRAVENOUS | Status: DC | PRN
  Administered 2022-05-01: 10 mL

## 2022-05-01 MED ORDER — SODIUM CHLORIDE 0.9 % IV SOLN
1600.0000 mg/m2 | INTRAVENOUS | Status: AC
  Administered 2022-05-01: 2650 mg via INTRAVENOUS
  Filled 2022-05-01: qty 53

## 2022-05-01 MED ORDER — SODIUM CHLORIDE 0.9 % IV SOLN: Freq: Once | INTRAVENOUS | Status: AC

## 2022-05-01 MED ORDER — POTASSIUM CHLORIDE ER 10 MEQ PO CPCR
ORAL_CAPSULE | Freq: Two times a day (BID) | ORAL | 2 refills | Status: DC
  Filled 2022-05-01: qty 60, 30d supply, fill #0
  Filled 2022-06-11: qty 60, 30d supply, fill #1
  Filled 2022-07-06: qty 60, 30d supply, fill #2

## 2022-05-01 NOTE — Patient Instructions (Addendum)
Kathy Robinson   Discharge Instructions: Thank you for choosing [facility] to provide your oncology and hematology care.   If you have a lab appointment with the [facility], please go directly to the [facility] and check in at the registration area.   Wear comfortable clothing and clothing appropriate for easy access to any Portacath or PICC line.   We strive to give you quality time with your provider. You may need to reschedule your appointment if you arrive late (15 or more minutes).  Arriving late affects you and other patients whose appointments are after yours.  Also, if you miss three or more appointments without notifying the office, you may be dismissed from the clinic at the provider's discretion.      For prescription refill requests, have your pharmacy contact our office and allow 72 hours for refills to be completed.    Today you received the following chemotherapy and/or immunotherapy agents Bevacizumab-bvzr (ZIRABEV) & Flourouracil (ADRUCIL).      To help prevent nausea and vomiting after your treatment, we encourage you to take your nausea medication as directed.  BELOW ARE SYMPTOMS THAT SHOULD BE REPORTED IMMEDIATELY: *FEVER GREATER THAN 100.4 F (38 C) OR HIGHER *CHILLS OR SWEATING *NAUSEA AND VOMITING THAT IS NOT CONTROLLED WITH YOUR NAUSEA MEDICATION *UNUSUAL SHORTNESS OF BREATH *UNUSUAL BRUISING OR BLEEDING *URINARY PROBLEMS (pain or burning when urinating, or frequent urination) *BOWEL PROBLEMS (unusual diarrhea, constipation, pain near the anus) TENDERNESS IN MOUTH AND THROAT WITH OR WITHOUT PRESENCE OF ULCERS (sore throat, sores in mouth, or a toothache) UNUSUAL RASH, SWELLING OR PAIN  UNUSUAL VAGINAL DISCHARGE OR ITCHING   Items with * indicate a potential emergency and should be followed up as soon as possible or go to the Emergency Department if any problems should occur.  Please show the CHEMOTHERAPY ALERT CARD or  IMMUNOTHERAPY ALERT CARD at check-in to the Emergency Department and triage nurse.  Should you have questions after your visit or need to cancel or reschedule your appointment, please contact [facility]  Dept: [phone]  and follow the prompts.  Office hours are 8:00 a.m. to 4:30 p.m. Monday - Friday. Please note that voicemails left after 4:00 p.m. may not be returned until the following business day.  We are closed weekends and major holidays. You have access to a nurse at all times for urgent questions. Please call the main number to the clinic Dept: [phone] and follow the prompts.   For any non-urgent questions, you may also contact your provider using MyChart. We now offer e-Visits for anyone 76 and older to request care online for non-urgent symptoms. For details visit [email or web address].   Also download the MyChart app! Go to the app store, search "MyChart", open the app, select [facility], and log in with your MyChart username and password.  Due to Covid, a mask is required upon entering the hospital/clinic. If you do not have a mask, one will be given to you upon arrival. For doctor visits, patients may have 1 support person aged 32 or older with them. For treatment visits, patients cannot have anyone with them due to current Covid guidelines and our immunocompromised population.   Bevacizumab injection What is this medication? BEVACIZUMAB (be va SIZ yoo mab) is a monoclonal antibody. It is used to treat many types of cancer. This medicine may be used for other purposes; ask your health care provider or pharmacist if you have questions.  COMMON BRAND NAME(S): Alymsys, Avastin, MVASI, Kathy Robinson What should I tell my care team before I take this medication? They need to know if you have any of these conditions: diabetes heart disease high blood pressure history of coughing up blood prior anthracycline chemotherapy (e.g., doxorubicin, daunorubicin,  epirubicin) recent or ongoing radiation therapy recent or planning to have surgery stroke an unusual or allergic reaction to bevacizumab, hamster proteins, mouse proteins, other medicines, foods, dyes, or preservatives pregnant or trying to get pregnant breast-feeding How should I use this medication? This medicine is for infusion into a vein. It is given by a health care professional in a hospital or clinic setting. Talk to your pediatrician regarding the use of this medicine in children. Special care may be needed. Overdosage: If you think you have taken too much of this medicine contact a poison control center or emergency room at once. NOTE: This medicine is only for you. Do not share this medicine with others. What if I miss a dose? It is important not to miss your dose. Call your doctor or health care professional if you are unable to keep an appointment. What may interact with this medication? Interactions are not expected. This list may not describe all possible interactions. Give your health care provider a list of all the medicines, herbs, non-prescription drugs, or dietary supplements you use. Also tell them if you smoke, drink alcohol, or use illegal drugs. Some items may interact with your medicine. What should I watch for while using this medication? Your condition will be monitored carefully while you are receiving this medicine. You will need important blood work and urine testing done while you are taking this medicine. This medicine may increase your risk to bruise or bleed. Call your doctor or health care professional if you notice any unusual bleeding. Before having surgery, talk to your health care provider to make sure it is ok. This drug can increase the risk of poor healing of your surgical site or wound. You will need to stop this drug for 28 days before surgery. After surgery, wait at least 28 days before restarting this drug. Make sure the surgical site or wound is  healed enough before restarting this drug. Talk to your health care provider if questions. Do not become pregnant while taking this medicine or for 6 months after stopping it. Women should inform their doctor if they wish to become pregnant or think they might be pregnant. There is a potential for serious side effects to an unborn child. Talk to your health care professional or pharmacist for more information. Do not breast-feed an infant while taking this medicine and for 6 months after the last dose. This medicine has caused ovarian failure in some women. This medicine may interfere with the ability to have a child. You should talk to your doctor or health care professional if you are concerned about your fertility. What side effects may I notice from receiving this medication? Side effects that you should report to your doctor or health care professional as soon as possible: allergic reactions like skin rash, itching or hives, swelling of the face, lips, or tongue chest pain or chest tightness chills coughing up blood high fever seizures severe constipation signs and symptoms of bleeding such as bloody or black, tarry stools; red or dark-brown urine; spitting up blood or brown material that looks like coffee grounds; red spots on the skin; unusual bruising or bleeding from the eye, gums, or nose signs and symptoms of a blood  clot such as breathing problems; chest pain; severe, sudden headache; pain, swelling, warmth in the leg signs and symptoms of a stroke like changes in vision; confusion; trouble speaking or understanding; severe headaches; sudden numbness or weakness of the face, arm or leg; trouble walking; dizziness; loss of balance or coordination stomach pain sweating swelling of legs or ankles vomiting weight gain Side effects that usually do not require medical attention (report to your doctor or health care professional if they continue or are bothersome): back pain changes in  taste decreased appetite dry skin nausea tiredness This list may not describe all possible side effects. Call your doctor for medical advice about side effects. You may report side effects to FDA at 1-800-FDA-1088. Where should I keep my medication? This drug is given in a hospital or clinic and will not be stored at home. NOTE: This sheet is a summary. It may not cover all possible information. If you have questions about this medicine, talk to your doctor, pharmacist, or health care provider.  2022 Elsevier/Gold Standard (2021-07-25 00:00:00)  Fluorouracil, 5-FU injection What is this medication? FLUOROURACIL, 5-FU (flure oh YOOR a sil) is a chemotherapy drug. It slows the growth of cancer cells. This medicine is used to treat many types of cancer like breast cancer, colon or rectal cancer, pancreatic cancer, and stomach cancer. This medicine may be used for other purposes; ask your health care provider or pharmacist if you have questions. COMMON BRAND NAME(S): Adrucil What should I tell my care team before I take this medication? They need to know if you have any of these conditions: blood disorders dihydropyrimidine dehydrogenase (DPD) deficiency infection (especially a virus infection such as chickenpox, cold sores, or herpes) kidney disease liver disease malnourished, poor nutrition recent or ongoing radiation therapy an unusual or allergic reaction to fluorouracil, other chemotherapy, other medicines, foods, dyes, or preservatives pregnant or trying to get pregnant breast-feeding How should I use this medication? This drug is given as an infusion or injection into a vein. It is administered in a hospital or clinic by a specially trained health care professional. Talk to your pediatrician regarding the use of this medicine in children. Special care may be needed. Overdosage: If you think you have taken too much of this medicine contact a poison control center or emergency room  at once. NOTE: This medicine is only for you. Do not share this medicine with others. What if I miss a dose? It is important not to miss your dose. Call your doctor or health care professional if you are unable to keep an appointment. What may interact with this medication? Do not take this medicine with any of the following medications: live virus vaccines This medicine may also interact with the following medications: medicines that treat or prevent blood clots like warfarin, enoxaparin, and dalteparin This list may not describe all possible interactions. Give your health care provider a list of all the medicines, herbs, non-prescription drugs, or dietary supplements you use. Also tell them if you smoke, drink alcohol, or use illegal drugs. Some items may interact with your medicine. What should I watch for while using this medication? Visit your doctor for checks on your progress. This drug may make you feel generally unwell. This is not uncommon, as chemotherapy can affect healthy cells as well as cancer cells. Report any side effects. Continue your course of treatment even though you feel ill unless your doctor tells you to stop. In some cases, you may be given additional medicines  to help with side effects. Follow all directions for their use. Call your doctor or health care professional for advice if you get a fever, chills or sore throat, or other symptoms of a cold or flu. Do not treat yourself. This drug decreases your body's ability to fight infections. Try to avoid being around people who are sick. This medicine may increase your risk to bruise or bleed. Call your doctor or health care professional if you notice any unusual bleeding. Be careful brushing and flossing your teeth or using a toothpick because you may get an infection or bleed more easily. If you have any dental work done, tell your dentist you are receiving this medicine. Avoid taking products that contain aspirin,  acetaminophen, ibuprofen, naproxen, or ketoprofen unless instructed by your doctor. These medicines may hide a fever. Do not become pregnant while taking this medicine. Women should inform their doctor if they wish to become pregnant or think they might be pregnant. There is a potential for serious side effects to an unborn child. Talk to your health care professional or pharmacist for more information. Do not breast-feed an infant while taking this medicine. Men should inform their doctor if they wish to father a child. This medicine may lower sperm counts. Do not treat diarrhea with over the counter products. Contact your doctor if you have diarrhea that lasts more than 2 days or if it is severe and watery. This medicine can make you more sensitive to the sun. Keep out of the sun. If you cannot avoid being in the sun, wear protective clothing and use sunscreen. Do not use sun lamps or tanning beds/booths. What side effects may I notice from receiving this medication? Side effects that you should report to your doctor or health care professional as soon as possible: allergic reactions like skin rash, itching or hives, swelling of the face, lips, or tongue low blood counts - this medicine may decrease the number of white blood cells, red blood cells and platelets. You may be at increased risk for infections and bleeding. signs of infection - fever or chills, cough, sore throat, pain or difficulty passing urine signs of decreased platelets or bleeding - bruising, pinpoint red spots on the skin, black, tarry stools, blood in the urine signs of decreased red blood cells - unusually weak or tired, fainting spells, lightheadedness breathing problems changes in vision chest pain mouth sores nausea and vomiting pain, swelling, redness at site where injected pain, tingling, numbness in the hands or feet redness, swelling, or sores on hands or feet stomach pain unusual bleeding Side effects that usually  do not require medical attention (report to your doctor or health care professional if they continue or are bothersome): changes in finger or toe nails diarrhea dry or itchy skin hair loss headache loss of appetite sensitivity of eyes to the light stomach upset unusually teary eyes This list may not describe all possible side effects. Call your doctor for medical advice about side effects. You may report side effects to FDA at 1-800-FDA-1088. Where should I keep my medication? This drug is given in a hospital or clinic and will not be stored at home. NOTE: This sheet is a summary. It may not cover all possible information. If you have questions about this medicine, talk to your doctor, pharmacist, or health care provider.  2022 Elsevier/Gold Standard (2021-07-25 00:00:00)  The chemotherapy medication bag should finish at 46 hours, 96 hours, or 7 days. For example, if your pump is scheduled  for 46 hours and it was put on at 4:00 p.m., it should finish at 2:00 p.m. the day it is scheduled to come off regardless of your appointment time.     Estimated time to finish at  12:30p.m. on Thursday 05/03/22.   If the display on your pump reads "Low Volume" and it is beeping, take the batteries out of the pump and come to the cancer center for it to be taken off.   If the pump alarms go off prior to the pump reading "Low Volume" then call [phone] and someone can assist you.  If the plunger comes out and the chemotherapy medication is leaking out, please use your home chemo spill kit to clean up the spill. Do NOT use paper towels or other household products.  If you have problems or questions regarding your pump, please call either [phone] (24 hours a day) or the cancer center Monday-Friday 8:00 a.m.- 4:30 p.m. at the clinic number and we will assist you. If you are unable to get assistance, then go to the nearest Emergency Department and ask the staff to contact the IV team for  assistance.

## 2022-05-01 NOTE — Progress Notes (Signed)
[facility] OFFICE PROGRESS NOTE   Diagnosis: Colon cancer  INTERVAL HISTORY:   Kathy Robinson returns as scheduled.  She completed another cycle of 5-fluorouracil 04/10/2022.  She denies nausea/vomiting.  No mouth sores.  No diarrhea.  No hand or foot pain or redness.  Unchanged neuropathy symptoms.  No bleeding.  Objective:  Vital signs in last 24 hours:  Blood pressure (!) 159/82, pulse 80, temperature 97.8 F (36.6 C), temperature source Oral, resp. rate 18, height _0  (1.676 m), weight 154 lb (69.9 kg), last menstrual period 11/19/1996, SpO2 100 %.    HEENT: No thrush or ulcers. Resp: Lungs clear bilaterally. Cardio: Regular rate and rhythm. GI: Abdomen soft and nontender.  No hepatosplenomegaly. Vascular: No leg edema. Skin: Palms without erythema. Port-A-Cath without erythema.   Lab Results:  Lab Results  Component Value Date   WBC 4.8 05/01/2022   HGB 13.3 05/01/2022   HCT 39.8 05/01/2022   MCV 106.4 (H) 05/01/2022   PLT 192 05/01/2022   NEUTROABS 2.3 05/01/2022    Imaging:  No results found.  Medications: I have reviewed the patient's current medications.  Assessment/Plan: Adenocarcinoma the left colon, stage IIIc (pT4b,pN2a), status post a left colectomy 05/30/2020, MSS, TMB 13, BRAF V600E Tumor invades the visceral peritoneum, lymphovascular and perineural invasion present, for tumor deposits, 7/13 lymph nodes, negative resection margins, no loss of mismatch repair protein expression CT abdomen/pelvis 05/26/2020-long segment of masslike thickening of the descending colon with associated high-grade colonic obstruction, small colonic wall defect with evidence of a focally contained microperforation, retroperitoneal adenopathy, indeterminate small hypodense liver lesions Elevated preoperative CEA PET 06/22/2020-hypermetabolic liver metastases, periportal and periaortic adenopathy, hypermetabolic mediastinal and left supraclavicular nodes Cycle 1  FOLFOX 07/05/20 Cycle 2 FOLFOXIRI (dose-reduced 5FU, no bolus) and Avastin 07/19/20  Cycle 3 FOLFOXIRI (Dose reduced 5-FU, no bolus)/Avastin 08/02/2020 Cycle 4 FOLFOXIRI/Avastin 08/16/2020 (Udenyca added) Cycle 5 FOLFOXIRI/Avastin 08/30/2020 CTs 09/09/2020-diminished size of lymph nodes in the chest, abdomen, and pelvis.  Decreased hepatic metastases.  No new evidence of disease progression, fat density lesion surrounding the left hemicolectomy Cycle 6 FOLFOXIRI/Avastin 09/13/2020 Cycle 7 FOLFOX/Avastin 09/27/2020 (irinotecan held) Cycle 8 FOLFOXIRI/Avastin 10/18/2020  Cycle 9 FOLFOXIRI/Avastin 11/01/2020 Cycle 10 FOLFOXIRI/Avastin 11/22/2020 (oxaliplatin held, irinotecan and 5-FU dose reduced) Cycle 11 FOLFOXIRI/Avastin 12/12/2020 (oxaliplatin held) CTs 12/19/2020-decrease in size of liver metastases, no evidence of disease progression Cycle 12 FOLFOXIRI/Avastin 01/04/2021 (oxaliplatin held) Cycle 13 FOLFOXIRI/Avastin 01/24/2021 (oxaliplatin held) Cycle 14 FOLFOXIRI/Avastin 02/14/2021 (oxaliplatin held) Cycle 15 FOLFOXIRI/Avastin 03/07/2021 (oxaliplatin held) Cycle 16 FOLFOXIRI/Avastin 03/29/2021 (oxaliplatin held) CTs 04/14/2021- decreased size of liver metastases.  No new or progressive findings. Cycle 17 FOLFOXIRI/Avastin 04/19/2021 (oxaliplatin held) Cycle 18 FOLFOXIRI/Avastin 05/09/2021 (oxaliplatin held) Cycle 19 FOLFOXIRI/Avastin 05/30/2021 (oxaliplatin held) Cycle 20 FOLFOXIRI/Avastin 06/20/2021 (oxaliplatin held) Cycle 21 FOLFOXIRI/Avastin 07/11/2021 (oxaliplatin held) CTs 07/28/2021-unchanged subcentimeter liver lesions, no evidence of new metastatic disease Cycle 22 5-FU/ Avastin 08/01/2021 Cycle 23 5-FU/Avastin 08/22/2021 Cycle 24 5-FU/Avastin 09/12/2021 Cycle 25 5-FU/Avastin 10/03/2021 Cycle 26 5-FU/Avastin 10/24/2021 Cycle 27 5-FU/Avastin 11/15/2021 Cycle 28 5-FU/Avastin 12/05/2021 CTs 12/22/2021-grossly similar subcentimeter low-attenuation lesions in the liver compatible with treated metastasis.   No evidence for new metastatic disease in the chest, abdomen or pelvis. Cycle 29 5-FU/Avastin 12/26/2021 Cycle 30 5-FU/Avastin 01/16/2022 Cycle 41 5-FU/Avastin 02/06/2022 Cycle 42 5-FU/Avastin 02/27/2022 Cycle 43 5-FU/Avastin 03/20/2022 Cycle 44 5-FU/Avastin 04/10/2022 CT 04/28/2022-interval development of a 3 mm nodule within the left lower lobe and 3 mm nodule within the right lower lobe, nonspecific.  Unchanged subcentimeter low-attenuation lesions in the liver  compatible with treated metastasis. 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  She is on active treatment with 5-FU/Avastin.  Recent restaging CTs show stable disease in the liver, 2 new tiny lung nodules which are nonspecific.  Plan is to continue 5-FU/Avastin every 3 weeks.  She is in agreement.  CBC and chemistry panel reviewed.  Labs adequate to proceed with treatment today as scheduled.  She will return for lab, follow-up, 5-FU/Avastin in 3 weeks.  We are available to see her sooner if needed.  Plan reviewed with Dr. Benay Spice.    Ned Card ANP/GNP-BC   05/01/2022  11:00 AM

## 2022-05-01 NOTE — Patient Instructions (Signed)

## 2022-05-01 NOTE — Progress Notes (Signed)
Patient seen by Lisa Thomas NP today  Vitals are within treatment parameters.  Labs reviewed by Lisa Thomas NP and are within treatment parameters.  Per physician team, patient is ready for treatment and there are NO modifications to the treatment plan.     

## 2022-05-03 ENCOUNTER — Inpatient Hospital Stay: Payer: Medicare Other

## 2022-05-03 VITALS — BP 140/97 | HR 84 | Temp 98.5°F | Resp 20

## 2022-05-03 DIAGNOSIS — C185 Malignant neoplasm of splenic flexure: Secondary | ICD-10-CM

## 2022-05-03 DIAGNOSIS — R918 Other nonspecific abnormal finding of lung field: Secondary | ICD-10-CM | POA: Diagnosis not present

## 2022-05-03 DIAGNOSIS — G629 Polyneuropathy, unspecified: Secondary | ICD-10-CM | POA: Diagnosis not present

## 2022-05-03 DIAGNOSIS — I4891 Unspecified atrial fibrillation: Secondary | ICD-10-CM | POA: Diagnosis not present

## 2022-05-03 DIAGNOSIS — E876 Hypokalemia: Secondary | ICD-10-CM | POA: Diagnosis not present

## 2022-05-03 DIAGNOSIS — D701 Agranulocytosis secondary to cancer chemotherapy: Secondary | ICD-10-CM | POA: Diagnosis not present

## 2022-05-03 DIAGNOSIS — C186 Malignant neoplasm of descending colon: Secondary | ICD-10-CM | POA: Diagnosis not present

## 2022-05-03 DIAGNOSIS — R197 Diarrhea, unspecified: Secondary | ICD-10-CM | POA: Diagnosis not present

## 2022-05-03 DIAGNOSIS — T451X5A Adverse effect of antineoplastic and immunosuppressive drugs, initial encounter: Secondary | ICD-10-CM | POA: Diagnosis not present

## 2022-05-03 DIAGNOSIS — Z5112 Encounter for antineoplastic immunotherapy: Secondary | ICD-10-CM | POA: Diagnosis not present

## 2022-05-03 DIAGNOSIS — Z5111 Encounter for antineoplastic chemotherapy: Secondary | ICD-10-CM | POA: Diagnosis not present

## 2022-05-03 DIAGNOSIS — C787 Secondary malignant neoplasm of liver and intrahepatic bile duct: Secondary | ICD-10-CM | POA: Diagnosis not present

## 2022-05-03 MED ORDER — HEPARIN SOD (PORK) LOCK FLUSH 100 UNIT/ML IV SOLN
500.0000 [IU] | Freq: Once | INTRAVENOUS | Status: AC | PRN
  Administered 2022-05-03: 500 [IU]

## 2022-05-03 MED ORDER — SODIUM CHLORIDE 0.9% FLUSH
10.0000 mL | INTRAVENOUS | Status: DC | PRN
  Administered 2022-05-03: 10 mL

## 2022-05-03 NOTE — Patient Instructions (Signed)
Heparin injection ?What is this medication? ?HEPARIN (HEP a rin) is an anticoagulant. It is used to treat or prevent clots in the veins, arteries, lungs, or heart. It stops clots from forming or getting bigger. This medicine prevents clotting during open-heart surgery, dialysis, or in patients who are confined to bed. ?This medicine may be used for other purposes; ask your health care provider or pharmacist if you have questions. ?COMMON BRAND NAME(S): Hep-Lock, Hep-Lock U/P, Hepflush-10, Monoject Prefill Advanced Heparin Lock Flush, SASH Normal Saline and Heparin ?What should I tell my care team before I take this medication? ?They need to know if you have any of these conditions: ?bleeding disorders, such as hemophilia or low blood platelets ?bowel disease or diverticulitis ?endocarditis ?high blood pressure ?liver disease ?recent surgery or delivery of a baby ?stomach ulcers ?an unusual or allergic reaction to heparin, benzyl alcohol, sulfites, other medicines, foods, dyes, or preservatives ?pregnant or trying to get pregnant ?breast-feeding ?How should I use this medication? ?This medicine is given by injection or infusion into a vein. It can also be given by injection of small amounts under the skin. It is usually given by a health care professional in a hospital or clinic setting. ?If you get this medicine at home, you will be taught how to prepare and give this medicine. Use exactly as directed. Take your medicine at regular intervals. Do not take it more often than directed. Do not stop taking except on your doctor's advice. Stopping this medicine may increase your risk of a blot clot. Be sure to refill your prescription before you run out of medicine. ?It is important that you put your used needles and syringes in a special sharps container. Do not put them in a trash can. If you do not have a sharps container, call your pharmacist or healthcare provider to get one. ?Talk to your pediatrician regarding the  use of this medicine in children. While this medicine may be prescribed for children for selected conditions, precautions do apply. ?Overdosage: If you think you have taken too much of this medicine contact a poison control center or emergency room at once. ?NOTE: This medicine is only for you. Do not share this medicine with others. ?What if I miss a dose? ?If you miss a dose, take it as soon as you can. If it is almost time for your next dose, take only that dose. Do not take double or extra doses. ?What may interact with this medication? ?Do not take this medicine with any of the following medications: ?aspirin and aspirin-like drugs ?mifepristone ?medicines that treat or prevent blood clots like warfarin, enoxaparin, and dalteparin ?palifermin ?protamine ?This medicine may also interact with the following medications: ?dextran ?digoxin ?hydroxychloroquine ?medicines for treating colds or allergies ?nicotine ?NSAIDs, medicines for pain and inflammation, like ibuprofen or naproxen ?phenylbutazone ?tetracycline antibiotics ?This list may not describe all possible interactions. Give your health care provider a list of all the medicines, herbs, non-prescription drugs, or dietary supplements you use. Also tell them if you smoke, drink alcohol, or use illegal drugs. Some items may interact with your medicine. ?What should I watch for while using this medication? ?Visit your healthcare professional for regular checks on your progress. You may need blood work done while you are taking this medicine. Your condition will be monitored carefully while you are receiving this medicine. It is important not to miss any appointments. ?Wear a medical ID bracelet or chain, and carry a card that describes your disease and details   of your medicine and dosage times. ?Notify your doctor or healthcare professional at once if you have cold, blue hands or feet. ?If you are going to need surgery or other procedure, tell your healthcare  professional that you are using this medicine. ?Avoid sports and activities that might cause injury while you are using this medicine. Severe falls or injuries can cause unseen bleeding. Be careful when using sharp tools or knives. Consider using an electric razor. Take special care brushing or flossing your teeth. Report any injuries, bruising, or red spots on the skin to your healthcare professional. ?Using this medicine for a long time may weaken your bones and increase the risk of bone fractures. ?You should make sure that you get enough calcium and vitamin D while you are taking this medicine. Discuss the foods you eat and the vitamins you take with your healthcare professional. ?Wear a medical ID bracelet or chain. Carry a card that describes your disease and details of your medicine and dosage times. ?What side effects may I notice from receiving this medication? ?Side effects that you should report to your doctor or health care professional as soon as possible: ?allergic reactions like skin rash, itching or hives, swelling of the face, lips, or tongue ?bone pain ?fever, chills ?nausea, vomiting ?signs and symptoms of bleeding such as bloody or black, tarry stools; red or dark-brown urine; spitting up blood or brown material that looks like coffee grounds; red spots on the skin; unusual bruising or bleeding from the eye, gums, or nose ?signs and symptoms of a blood clot such as chest pain; shortness of breath; pain, swelling, or warmth in the leg ?signs and symptoms of a stroke such as changes in vision; confusion; trouble speaking or understanding; severe headaches; sudden numbness or weakness of the face, arm or leg; trouble walking; dizziness; loss of coordination ?Side effects that usually do not require medical attention (report to your doctor or health care professional if they continue or are bothersome): ?hair loss ?pain, redness, or irritation at site where injected ?This list may not describe all  possible side effects. Call your doctor for medical advice about side effects. You may report side effects to FDA at 1-800-FDA-1088. ?Where should I keep my medication? ?Keep out of the reach of children. ?Store unopened vials at room temperature between 15 and 30 degrees C (59 and 86 degrees F). Do not freeze. Do not use if solution is discolored or particulate matter is present. Throw away any unused medicine after the expiration date. ?NOTE: This sheet is a summary. It may not cover all possible information. If you have questions about this medicine, talk to your doctor, pharmacist, or health care provider. ?? 2023 Elsevier/Gold Standard (2020-12-15 00:00:00) ? ?

## 2022-05-16 DIAGNOSIS — H269 Unspecified cataract: Secondary | ICD-10-CM | POA: Diagnosis not present

## 2022-05-16 DIAGNOSIS — H2512 Age-related nuclear cataract, left eye: Secondary | ICD-10-CM | POA: Diagnosis not present

## 2022-05-26 ENCOUNTER — Other Ambulatory Visit: Payer: Self-pay | Admitting: Oncology

## 2022-05-29 ENCOUNTER — Inpatient Hospital Stay: Payer: Medicare Other

## 2022-05-29 ENCOUNTER — Inpatient Hospital Stay: Payer: Medicare Other | Attending: Oncology

## 2022-05-29 ENCOUNTER — Encounter: Payer: Self-pay | Admitting: *Deleted

## 2022-05-29 ENCOUNTER — Inpatient Hospital Stay: Payer: Medicare Other | Admitting: Oncology

## 2022-05-29 VITALS — BP 139/69 | HR 76 | Temp 98.1°F | Resp 18 | Ht 66.0 in | Wt 152.6 lb

## 2022-05-29 DIAGNOSIS — C186 Malignant neoplasm of descending colon: Secondary | ICD-10-CM | POA: Diagnosis not present

## 2022-05-29 DIAGNOSIS — T451X5A Adverse effect of antineoplastic and immunosuppressive drugs, initial encounter: Secondary | ICD-10-CM | POA: Diagnosis not present

## 2022-05-29 DIAGNOSIS — C787 Secondary malignant neoplasm of liver and intrahepatic bile duct: Secondary | ICD-10-CM | POA: Diagnosis not present

## 2022-05-29 DIAGNOSIS — C185 Malignant neoplasm of splenic flexure: Secondary | ICD-10-CM

## 2022-05-29 DIAGNOSIS — E876 Hypokalemia: Secondary | ICD-10-CM | POA: Insufficient documentation

## 2022-05-29 DIAGNOSIS — G620 Drug-induced polyneuropathy: Secondary | ICD-10-CM | POA: Diagnosis not present

## 2022-05-29 DIAGNOSIS — D709 Neutropenia, unspecified: Secondary | ICD-10-CM | POA: Diagnosis not present

## 2022-05-29 DIAGNOSIS — C778 Secondary and unspecified malignant neoplasm of lymph nodes of multiple regions: Secondary | ICD-10-CM | POA: Diagnosis not present

## 2022-05-29 DIAGNOSIS — I4891 Unspecified atrial fibrillation: Secondary | ICD-10-CM | POA: Insufficient documentation

## 2022-05-29 DIAGNOSIS — Z5111 Encounter for antineoplastic chemotherapy: Secondary | ICD-10-CM | POA: Insufficient documentation

## 2022-05-29 LAB — CMP (CANCER CENTER ONLY)
ALT: 16 U/L (ref 0–44)
AST: 22 U/L (ref 15–41)
Albumin: 4 g/dL (ref 3.5–5.0)
Alkaline Phosphatase: 62 U/L (ref 38–126)
Anion gap: 8 (ref 5–15)
BUN: 23 mg/dL (ref 8–23)
CO2: 27 mmol/L (ref 22–32)
Calcium: 9.6 mg/dL (ref 8.9–10.3)
Chloride: 103 mmol/L (ref 98–111)
Creatinine: 1.04 mg/dL — ABNORMAL HIGH (ref 0.44–1.00)
GFR, Estimated: 55 mL/min — ABNORMAL LOW (ref >60)
Glucose, Bld: 86 mg/dL (ref 70–99)
Potassium: 4.7 mmol/L (ref 3.5–5.1)
Sodium: 138 mmol/L (ref 135–145)
Total Bilirubin: 0.4 mg/dL (ref 0.3–1.2)
Total Protein: 6.9 g/dL (ref 6.5–8.1)

## 2022-05-29 LAB — CBC WITH DIFFERENTIAL (CANCER CENTER ONLY)
Abs Immature Granulocytes: 0.01 10*3/uL (ref 0.00–0.07)
Basophils Absolute: 0 10*3/uL (ref 0.0–0.1)
Basophils Relative: 1 %
Eosinophils Absolute: 0.4 10*3/uL (ref 0.0–0.5)
Eosinophils Relative: 9 %
HCT: 39.3 % (ref 36.0–46.0)
Hemoglobin: 13.2 g/dL (ref 12.0–15.0)
Immature Granulocytes: 0 %
Lymphocytes Relative: 26 %
Lymphs Abs: 1.3 10*3/uL (ref 0.7–4.0)
MCH: 35.7 pg — ABNORMAL HIGH (ref 26.0–34.0)
MCHC: 33.6 g/dL (ref 30.0–36.0)
MCV: 106.2 fL — ABNORMAL HIGH (ref 80.0–100.0)
Monocytes Absolute: 0.6 10*3/uL (ref 0.1–1.0)
Monocytes Relative: 11 %
Neutro Abs: 2.6 10*3/uL (ref 1.7–7.7)
Neutrophils Relative %: 53 %
Platelet Count: 175 10*3/uL (ref 150–400)
RBC: 3.7 MIL/uL — ABNORMAL LOW (ref 3.87–5.11)
RDW: 13.1 % (ref 11.5–15.5)
WBC Count: 4.9 10*3/uL (ref 4.0–10.5)
nRBC: 0 % (ref 0.0–0.2)

## 2022-05-29 LAB — CEA (ACCESS): CEA (CHCC): 1.34 ng/mL (ref 0.00–5.00)

## 2022-05-29 LAB — TOTAL PROTEIN, URINE DIPSTICK: Protein, ur: NEGATIVE mg/dL

## 2022-05-29 MED ORDER — SODIUM CHLORIDE 0.9 % IV SOLN: Freq: Once | INTRAVENOUS | Status: AC

## 2022-05-29 MED ORDER — SODIUM CHLORIDE 0.9 % IV SOLN
7.5000 mg/kg | Freq: Once | INTRAVENOUS | Status: AC
  Administered 2022-05-29: 500 mg via INTRAVENOUS
  Filled 2022-05-29: qty 4

## 2022-05-29 MED ORDER — SODIUM CHLORIDE 0.9 % IV SOLN
1600.0000 mg/m2 | INTRAVENOUS | Status: DC
  Administered 2022-05-29: 2650 mg via INTRAVENOUS
  Filled 2022-05-29: qty 53

## 2022-05-29 MED ORDER — SODIUM CHLORIDE 0.9% FLUSH
10.0000 mL | INTRAVENOUS | Status: DC | PRN
  Administered 2022-05-29: 10 mL

## 2022-05-29 NOTE — Patient Instructions (Signed)

## 2022-05-29 NOTE — Patient Instructions (Signed)
Kathy Robinson   Discharge Instructions: Thank you for choosing [facility] to provide your oncology and hematology care.   If you have a lab appointment with the [facility], please go directly to the [facility] and check in at the registration area.   Wear comfortable clothing and clothing appropriate for easy access to any Portacath or PICC line.   We strive to give you quality time with your provider. You may need to reschedule your appointment if you arrive late (15 or more minutes).  Arriving late affects you and other patients whose appointments are after yours.  Also, if you miss three or more appointments without notifying the office, you may be dismissed from the clinic at the provider's discretion.      For prescription refill requests, have your pharmacy contact our office and allow 72 hours for refills to be completed.    Today you received the following chemotherapy and/or immunotherapy agents Bevacizumab-bvzr (ZIRABEV) & Flourouracil (ADRUCIL).      To help prevent nausea and vomiting after your treatment, we encourage you to take your nausea medication as directed.  BELOW ARE SYMPTOMS THAT SHOULD BE REPORTED IMMEDIATELY: *FEVER GREATER THAN 100.4 F (38 C) OR HIGHER *CHILLS OR SWEATING *NAUSEA AND VOMITING THAT IS NOT CONTROLLED WITH YOUR NAUSEA MEDICATION *UNUSUAL SHORTNESS OF BREATH *UNUSUAL BRUISING OR BLEEDING *URINARY PROBLEMS (pain or burning when urinating, or frequent urination) *BOWEL PROBLEMS (unusual diarrhea, constipation, pain near the anus) TENDERNESS IN MOUTH AND THROAT WITH OR WITHOUT PRESENCE OF ULCERS (sore throat, sores in mouth, or a toothache) UNUSUAL RASH, SWELLING OR PAIN  UNUSUAL VAGINAL DISCHARGE OR ITCHING   Items with * indicate a potential emergency and should be followed up as soon as possible or go to the Emergency Department if any problems should occur.  Please show the CHEMOTHERAPY ALERT CARD or  IMMUNOTHERAPY ALERT CARD at check-in to the Emergency Department and triage nurse.  Should you have questions after your visit or need to cancel or reschedule your appointment, please contact [facility]  Dept: [phone]  and follow the prompts.  Office hours are 8:00 a.m. to 4:30 p.m. Monday - Friday. Please note that voicemails left after 4:00 p.m. may not be returned until the following business day.  We are closed weekends and major holidays. You have access to a nurse at all times for urgent questions. Please call the main number to the clinic Dept: [phone] and follow the prompts.   For any non-urgent questions, you may also contact your provider using MyChart. We now offer e-Visits for anyone 76 and older to request care online for non-urgent symptoms. For details visit [email or web address].   Also download the MyChart app! Go to the app store, search "MyChart", open the app, select [facility], and log in with your MyChart username and password.  Due to Covid, a mask is required upon entering the hospital/clinic. If you do not have a mask, one will be given to you upon arrival. For doctor visits, patients may have 1 support person aged 32 or older with them. For treatment visits, patients cannot have anyone with them due to current Covid guidelines and our immunocompromised population.   Bevacizumab injection What is this medication? BEVACIZUMAB (be va SIZ yoo mab) is a monoclonal antibody. It is used to treat many types of cancer. This medicine may be used for other purposes; ask your health care provider or pharmacist if you have questions.  COMMON BRAND NAME(S): Alymsys, Avastin, MVASI, [name] What should I tell my care team before I take this medication? They need to know if you have any of these conditions: diabetes heart disease high blood pressure history of coughing up blood prior anthracycline chemotherapy (e.g., doxorubicin, daunorubicin,  epirubicin) recent or ongoing radiation therapy recent or planning to have surgery stroke an unusual or allergic reaction to bevacizumab, hamster proteins, mouse proteins, other medicines, foods, dyes, or preservatives pregnant or trying to get pregnant breast-feeding How should I use this medication? This medicine is for infusion into a vein. It is given by a health care professional in a hospital or clinic setting. Talk to your pediatrician regarding the use of this medicine in children. Special care may be needed. Overdosage: If you think you have taken too much of this medicine contact a poison control center or emergency room at once. NOTE: This medicine is only for you. Do not share this medicine with others. What if I miss a dose? It is important not to miss your dose. Call your doctor or health care professional if you are unable to keep an appointment. What may interact with this medication? Interactions are not expected. This list may not describe all possible interactions. Give your health care provider a list of all the medicines, herbs, non-prescription drugs, or dietary supplements you use. Also tell them if you smoke, drink alcohol, or use illegal drugs. Some items may interact with your medicine. What should I watch for while using this medication? Your condition will be monitored carefully while you are receiving this medicine. You will need important blood work and urine testing done while you are taking this medicine. This medicine may increase your risk to bruise or bleed. Call your doctor or health care professional if you notice any unusual bleeding. Before having surgery, talk to your health care provider to make sure it is ok. This drug can increase the risk of poor healing of your surgical site or wound. You will need to stop this drug for 28 days before surgery. After surgery, wait at least 28 days before restarting this drug. Make sure the surgical site or wound is  healed enough before restarting this drug. Talk to your health care provider if questions. Do not become pregnant while taking this medicine or for 6 months after stopping it. Women should inform their doctor if they wish to become pregnant or think they might be pregnant. There is a potential for serious side effects to an unborn child. Talk to your health care professional or pharmacist for more information. Do not breast-feed an infant while taking this medicine and for 6 months after the last dose. This medicine has caused ovarian failure in some women. This medicine may interfere with the ability to have a child. You should talk to your doctor or health care professional if you are concerned about your fertility. What side effects may I notice from receiving this medication? Side effects that you should report to your doctor or health care professional as soon as possible: allergic reactions like skin rash, itching or hives, swelling of the face, lips, or tongue chest pain or chest tightness chills coughing up blood high fever seizures severe constipation signs and symptoms of bleeding such as bloody or black, tarry stools; red or dark-brown urine; spitting up blood or brown material that looks like coffee grounds; red spots on the skin; unusual bruising or bleeding from the eye, gums, or nose signs and symptoms of a blood  clot such as breathing problems; chest pain; severe, sudden headache; pain, swelling, warmth in the leg signs and symptoms of a stroke like changes in vision; confusion; trouble speaking or understanding; severe headaches; sudden numbness or weakness of the face, arm or leg; trouble walking; dizziness; loss of balance or coordination stomach pain sweating swelling of legs or ankles vomiting weight gain Side effects that usually do not require medical attention (report to your doctor or health care professional if they continue or are bothersome): back pain changes in  taste decreased appetite dry skin nausea tiredness This list may not describe all possible side effects. Call your doctor for medical advice about side effects. You may report side effects to FDA at 1-800-FDA-1088. Where should I keep my medication? This drug is given in a hospital or clinic and will not be stored at home. NOTE: This sheet is a summary. It may not cover all possible information. If you have questions about this medicine, talk to your doctor, pharmacist, or health care provider.  2022 Elsevier/Gold Standard (2021-07-25 00:00:00)  Fluorouracil, 5-FU injection What is this medication? FLUOROURACIL, 5-FU (flure oh YOOR a sil) is a chemotherapy drug. It slows the growth of cancer cells. This medicine is used to treat many types of cancer like breast cancer, colon or rectal cancer, pancreatic cancer, and stomach cancer. This medicine may be used for other purposes; ask your health care provider or pharmacist if you have questions. COMMON BRAND NAME(S): Adrucil What should I tell my care team before I take this medication? They need to know if you have any of these conditions: blood disorders dihydropyrimidine dehydrogenase (DPD) deficiency infection (especially a virus infection such as chickenpox, cold sores, or herpes) kidney disease liver disease malnourished, poor nutrition recent or ongoing radiation therapy an unusual or allergic reaction to fluorouracil, other chemotherapy, other medicines, foods, dyes, or preservatives pregnant or trying to get pregnant breast-feeding How should I use this medication? This drug is given as an infusion or injection into a vein. It is administered in a hospital or clinic by a specially trained health care professional. Talk to your pediatrician regarding the use of this medicine in children. Special care may be needed. Overdosage: If you think you have taken too much of this medicine contact a poison control center or emergency room  at once. NOTE: This medicine is only for you. Do not share this medicine with others. What if I miss a dose? It is important not to miss your dose. Call your doctor or health care professional if you are unable to keep an appointment. What may interact with this medication? Do not take this medicine with any of the following medications: live virus vaccines This medicine may also interact with the following medications: medicines that treat or prevent blood clots like warfarin, enoxaparin, and dalteparin This list may not describe all possible interactions. Give your health care provider a list of all the medicines, herbs, non-prescription drugs, or dietary supplements you use. Also tell them if you smoke, drink alcohol, or use illegal drugs. Some items may interact with your medicine. What should I watch for while using this medication? Visit your doctor for checks on your progress. This drug may make you feel generally unwell. This is not uncommon, as chemotherapy can affect healthy cells as well as cancer cells. Report any side effects. Continue your course of treatment even though you feel ill unless your doctor tells you to stop. In some cases, you may be given additional medicines  to help with side effects. Follow all directions for their use. Call your doctor or health care professional for advice if you get a fever, chills or sore throat, or other symptoms of a cold or flu. Do not treat yourself. This drug decreases your body's ability to fight infections. Try to avoid being around people who are sick. This medicine may increase your risk to bruise or bleed. Call your doctor or health care professional if you notice any unusual bleeding. Be careful brushing and flossing your teeth or using a toothpick because you may get an infection or bleed more easily. If you have any dental work done, tell your dentist you are receiving this medicine. Avoid taking products that contain aspirin,  acetaminophen, ibuprofen, naproxen, or ketoprofen unless instructed by your doctor. These medicines may hide a fever. Do not become pregnant while taking this medicine. Women should inform their doctor if they wish to become pregnant or think they might be pregnant. There is a potential for serious side effects to an unborn child. Talk to your health care professional or pharmacist for more information. Do not breast-feed an infant while taking this medicine. Men should inform their doctor if they wish to father a child. This medicine may lower sperm counts. Do not treat diarrhea with over the counter products. Contact your doctor if you have diarrhea that lasts more than 2 days or if it is severe and watery. This medicine can make you more sensitive to the sun. Keep out of the sun. If you cannot avoid being in the sun, wear protective clothing and use sunscreen. Do not use sun lamps or tanning beds/booths. What side effects may I notice from receiving this medication? Side effects that you should report to your doctor or health care professional as soon as possible: allergic reactions like skin rash, itching or hives, swelling of the face, lips, or tongue low blood counts - this medicine may decrease the number of white blood cells, red blood cells and platelets. You may be at increased risk for infections and bleeding. signs of infection - fever or chills, cough, sore throat, pain or difficulty passing urine signs of decreased platelets or bleeding - bruising, pinpoint red spots on the skin, black, tarry stools, blood in the urine signs of decreased red blood cells - unusually weak or tired, fainting spells, lightheadedness breathing problems changes in vision chest pain mouth sores nausea and vomiting pain, swelling, redness at site where injected pain, tingling, numbness in the hands or feet redness, swelling, or sores on hands or feet stomach pain unusual bleeding Side effects that usually  do not require medical attention (report to your doctor or health care professional if they continue or are bothersome): changes in finger or toe nails diarrhea dry or itchy skin hair loss headache loss of appetite sensitivity of eyes to the light stomach upset unusually teary eyes This list may not describe all possible side effects. Call your doctor for medical advice about side effects. You may report side effects to FDA at 1-800-FDA-1088. Where should I keep my medication? This drug is given in a hospital or clinic and will not be stored at home. NOTE: This sheet is a summary. It may not cover all possible information. If you have questions about this medicine, talk to your doctor, pharmacist, or health care provider.  2022 Elsevier/Gold Standard (2021-07-25 00:00:00)  The chemotherapy medication bag should finish at 46 hours, 96 hours, or 7 days. For example, if your pump is scheduled  for 46 hours and it was put on at 4:00 p.m., it should finish at 2:00 p.m. the day it is scheduled to come off regardless of your appointment time.     Estimated time to finish at  12:30p.m. on Thursday 05/31/22.   If the display on your pump reads "Low Volume" and it is beeping, take the batteries out of the pump and come to the cancer center for it to be taken off.   If the pump alarms go off prior to the pump reading "Low Volume" then call [phone] and someone can assist you.  If the plunger comes out and the chemotherapy medication is leaking out, please use your home chemo spill kit to clean up the spill. Do NOT use paper towels or other household products.  If you have problems or questions regarding your pump, please call either [phone] (24 hours a day) or the cancer center Monday-Friday 8:00 a.m.- 4:30 p.m. at the clinic number and we will assist you. If you are unable to get assistance, then go to the nearest Emergency Department and ask the staff to contact the IV team for  assistance.

## 2022-05-29 NOTE — Progress Notes (Signed)
[place] OFFICE PROGRESS NOTE   Diagnosis: Colon cancer  INTERVAL HISTORY:   Kathy Robinson returns as scheduled.  She completed another cycle of 5-FU/bevacizumab on 05/01/2022.  No mouth sores, nausea, diarrhea, bleeding, or symptom of thrombosis.  She feels well.  She underwent left cataract surgery 05/17/2022.  The left eye vision has improved.  She is scheduled for right cataract surgery 06/06/2022.  Objective:  Vital signs in last 24 hours:  Blood pressure 139/69, pulse 76, temperature 98.1 F (36.7 C), temperature source Oral, resp. rate 18, height $RemoveBe'5\' 6"'ZUXEYIIiv$  (1.676 m), weight 152 lb 9.6 oz (69.2 kg), last menstrual period 11/19/1996, SpO2 98 %.    HEENT: No thrush or ulcers Resp: Lungs clear bilaterally Cardio: Regular rate and rhythm GI: No hepatosplenomegaly Vascular: No leg edema    Portacath/PICC-without erythema  Lab Results:  Lab Results  Component Value Date   WBC 4.9 05/29/2022   HGB 13.2 05/29/2022   HCT 39.3 05/29/2022   MCV 106.2 (H) 05/29/2022   PLT 175 05/29/2022   NEUTROABS 2.6 05/29/2022    CMP  Lab Results  Component Value Date   NA 138 05/29/2022   K 4.7 05/29/2022   CL 103 05/29/2022   CO2 27 05/29/2022   GLUCOSE 86 05/29/2022   BUN 23 05/29/2022   CREATININE 1.04 (H) 05/29/2022   CALCIUM 9.6 05/29/2022   PROT 6.9 05/29/2022   ALBUMIN 4.0 05/29/2022   AST 22 05/29/2022   ALT 16 05/29/2022   ALKPHOS 62 05/29/2022   BILITOT 0.4 05/29/2022   GFRNONAA 55 (L) 05/29/2022   GFRAA >60 08/16/2020    Lab Results  Component Value Date   CEA1 1.16 03/29/2021   CEA 1.66 05/01/2022     Medications: I have reviewed the patient's current medications.   Assessment/Plan: Adenocarcinoma the left colon, stage IIIc (pT4b,pN2a), status post a left colectomy 05/30/2020, MSS, TMB 13, BRAF V600E Tumor invades the visceral peritoneum, lymphovascular and perineural invasion present, for tumor deposits, 7/13 lymph nodes, negative resection  margins, no loss of mismatch repair protein expression CT abdomen/pelvis 05/26/2020-long segment of masslike thickening of the descending colon with associated high-grade colonic obstruction, small colonic wall defect with evidence of a focally contained microperforation, retroperitoneal adenopathy, indeterminate small hypodense liver lesions Elevated preoperative CEA PET 06/22/2020-hypermetabolic liver metastases, periportal and periaortic adenopathy, hypermetabolic mediastinal and left supraclavicular nodes Cycle 1 FOLFOX 07/05/20 Cycle 2 FOLFOXIRI (dose-reduced 5FU, no bolus) and Avastin 07/19/20  Cycle 3 FOLFOXIRI (Dose reduced 5-FU, no bolus)/Avastin 08/02/2020 Cycle 4 FOLFOXIRI/Avastin 08/16/2020 (Udenyca added) Cycle 5 FOLFOXIRI/Avastin 08/30/2020 CTs 09/09/2020-diminished size of lymph nodes in the chest, abdomen, and pelvis.  Decreased hepatic metastases.  No new evidence of disease progression, fat density lesion surrounding the left hemicolectomy Cycle 6 FOLFOXIRI/Avastin 09/13/2020 Cycle 7 FOLFOX/Avastin 09/27/2020 (irinotecan held) Cycle 8 FOLFOXIRI/Avastin 10/18/2020  Cycle 9 FOLFOXIRI/Avastin 11/01/2020 Cycle 10 FOLFOXIRI/Avastin 11/22/2020 (oxaliplatin held, irinotecan and 5-FU dose reduced) Cycle 11 FOLFOXIRI/Avastin 12/12/2020 (oxaliplatin held) CTs 12/19/2020-decrease in size of liver metastases, no evidence of disease progression Cycle 12 FOLFOXIRI/Avastin 01/04/2021 (oxaliplatin held) Cycle 13 FOLFOXIRI/Avastin 01/24/2021 (oxaliplatin held) Cycle 14 FOLFOXIRI/Avastin 02/14/2021 (oxaliplatin held) Cycle 15 FOLFOXIRI/Avastin 03/07/2021 (oxaliplatin held) Cycle 16 FOLFOXIRI/Avastin 03/29/2021 (oxaliplatin held) CTs 04/14/2021- decreased size of liver metastases.  No new or progressive findings. Cycle 17 FOLFOXIRI/Avastin 04/19/2021 (oxaliplatin held) Cycle 18 FOLFOXIRI/Avastin 05/09/2021 (oxaliplatin held) Cycle 19 FOLFOXIRI/Avastin 05/30/2021 (oxaliplatin held) Cycle 20 FOLFOXIRI/Avastin  06/20/2021 (oxaliplatin held) Cycle 21 FOLFOXIRI/Avastin 07/11/2021 (oxaliplatin held) CTs 07/28/2021-unchanged subcentimeter liver lesions, no evidence  of new metastatic disease Cycle 22 5-FU/ Avastin 08/01/2021 Cycle 23 5-FU/Avastin 08/22/2021 Cycle 24 5-FU/Avastin 09/12/2021 Cycle 25 5-FU/Avastin 10/03/2021 Cycle 26 5-FU/Avastin 10/24/2021 Cycle 27 5-FU/Avastin 11/15/2021 Cycle 28 5-FU/Avastin 12/05/2021 CTs 12/22/2021-grossly similar subcentimeter low-attenuation lesions in the liver compatible with treated metastasis.  No evidence for new metastatic disease in the chest, abdomen or pelvis. Cycle 29 5-FU/Avastin 12/26/2021 Cycle 30 5-FU/Avastin 01/16/2022 Cycle 41 5-FU/Avastin 02/06/2022 Cycle 42 5-FU/Avastin 02/27/2022 Cycle 43 5-FU/Avastin 03/20/2022 Cycle 44 5-FU/Avastin 04/10/2022 CT 04/28/2022-interval development of a 3 mm nodule within the left lower lobe and 3 mm nodule within the right lower lobe, nonspecific.  Unchanged subcentimeter low-attenuation lesions in the liver compatible with treated metastasis. Cycle 45 5-FU/Avastin 05/01/2022 Cycle 46 5-FU/Avastin 05/29/2022     Atrial fibrillation with rapid ventricular response 05/26/2020 Mild neutropenia following cycle 3 FOLFOXIRI, Udenyca added with cycle 4 Hypokalemia secondary to diarrhea-potassium supplementation starting 09/13/2020 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  She continues to tolerate the 5-FU/Avastin well.  She will complete another cycle today.  She will return for an office visit and chemotherapy in 3 weeks.  She plans to have white cataract surgery next week.  Kathy Coder, MD  05/29/2022  11:15 AM

## 2022-05-29 NOTE — Progress Notes (Signed)
Ok to proceed with Bevacizumab treatment today with cataract surgery scheduled for next week per MD.  Acquanetta Belling, RPH, BCPS, BCOP 05/29/2022 11:47 AM

## 2022-05-29 NOTE — Progress Notes (Signed)
Patient seen by Dr. Sherrill today ? ?Vitals are within treatment parameters. ? ?Labs reviewed by Dr. Sherrill and are within treatment parameters. ? ?Per physician team, patient is ready for treatment and there are NO modifications to the treatment plan.  ?

## 2022-05-31 ENCOUNTER — Inpatient Hospital Stay: Payer: Medicare Other

## 2022-05-31 VITALS — BP 142/81 | HR 93 | Temp 97.7°F | Resp 18

## 2022-05-31 DIAGNOSIS — I4891 Unspecified atrial fibrillation: Secondary | ICD-10-CM | POA: Diagnosis not present

## 2022-05-31 DIAGNOSIS — Z95828 Presence of other vascular implants and grafts: Secondary | ICD-10-CM

## 2022-05-31 DIAGNOSIS — G620 Drug-induced polyneuropathy: Secondary | ICD-10-CM | POA: Diagnosis not present

## 2022-05-31 DIAGNOSIS — C186 Malignant neoplasm of descending colon: Secondary | ICD-10-CM | POA: Diagnosis not present

## 2022-05-31 DIAGNOSIS — E876 Hypokalemia: Secondary | ICD-10-CM | POA: Diagnosis not present

## 2022-05-31 DIAGNOSIS — C778 Secondary and unspecified malignant neoplasm of lymph nodes of multiple regions: Secondary | ICD-10-CM | POA: Diagnosis not present

## 2022-05-31 DIAGNOSIS — D709 Neutropenia, unspecified: Secondary | ICD-10-CM | POA: Diagnosis not present

## 2022-05-31 DIAGNOSIS — Z5111 Encounter for antineoplastic chemotherapy: Secondary | ICD-10-CM | POA: Diagnosis not present

## 2022-05-31 DIAGNOSIS — C787 Secondary malignant neoplasm of liver and intrahepatic bile duct: Secondary | ICD-10-CM | POA: Diagnosis not present

## 2022-05-31 DIAGNOSIS — T451X5A Adverse effect of antineoplastic and immunosuppressive drugs, initial encounter: Secondary | ICD-10-CM | POA: Diagnosis not present

## 2022-05-31 IMAGING — CT CT ABD-PELV W/ CM
1 of 3 series · 12 of 32 positions shown, 18 images · IV contrast (iopamidol)
Comparison: None.

CLINICAL DATA: 78-year-old female with bloating x2 months. Nausea
and vomiting. Patient is unable to eat or drink.

EXAM:
CT ABDOMEN AND PELVIS WITH CONTRAST
TECHNIQUE: Multidetector CT imaging of the abdomen and pelvis was performed
using the standard protocol following bolus administration of
intravenous contrast.
CONTRAST:  100mL [id number] IOPAMIDOL ([id number]) INJECTION 61%

[Series 2: abd/pelvis w/cm · axial · 0.73mm/px · z∈[-414,-34]mm · 12 of 90 slices shown, 18 images]
[im 7/90  soft-tissue]
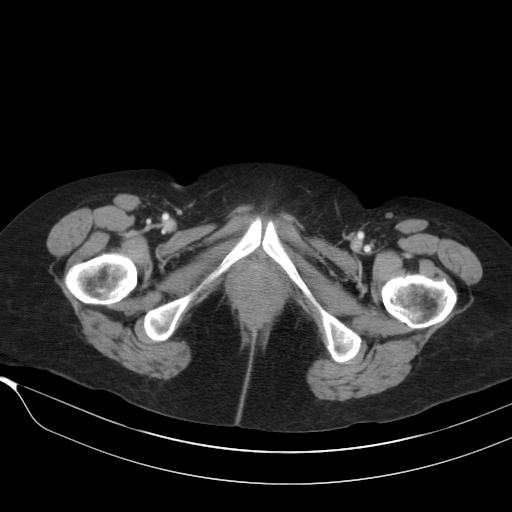
[im 7/90  bone]
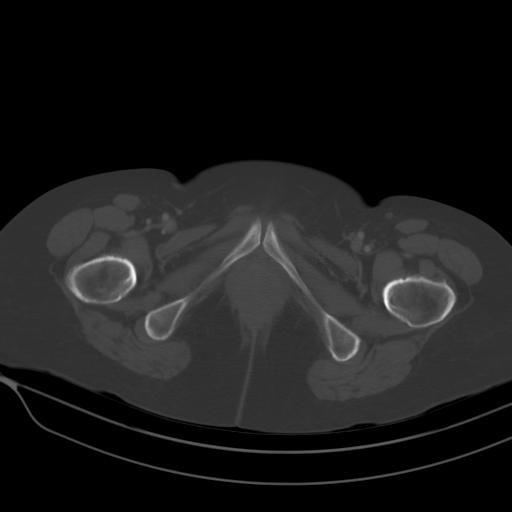
[im 13/90  soft-tissue]
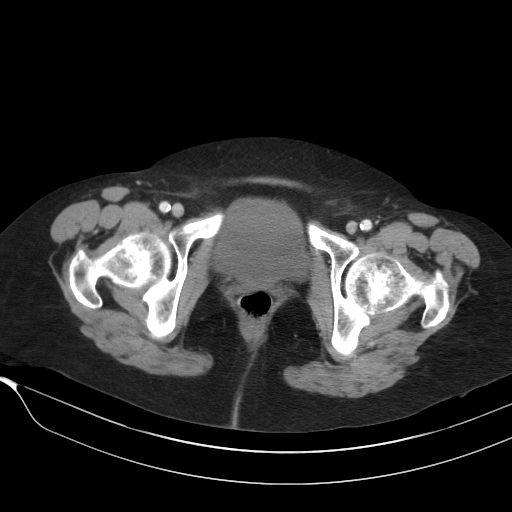
[im 20/90  soft-tissue]
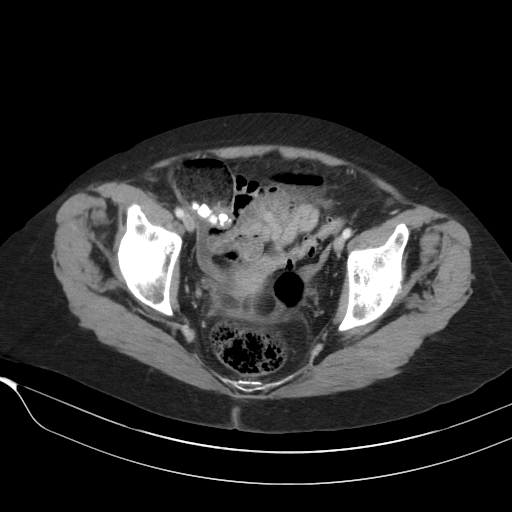
[im 26/90  soft-tissue]
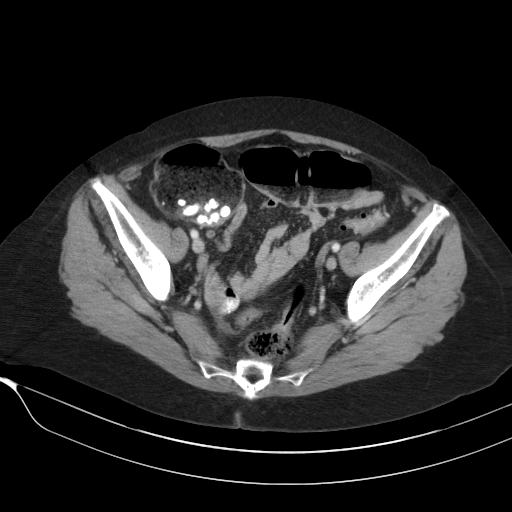
[im 32/90  soft-tissue]
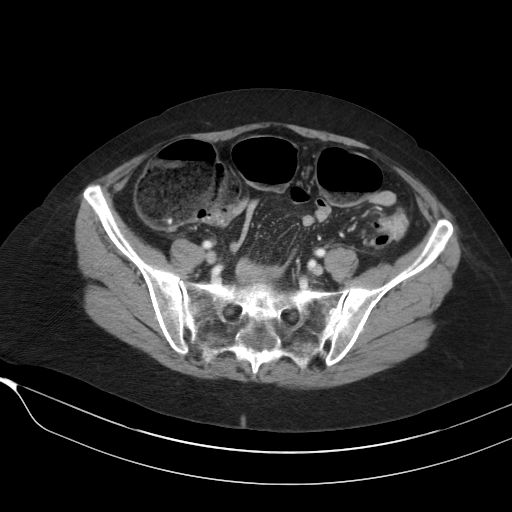
[im 39/90  soft-tissue]
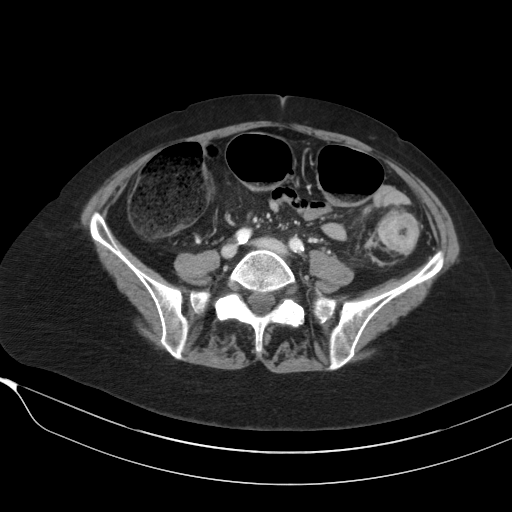
[im 51/90  soft-tissue]
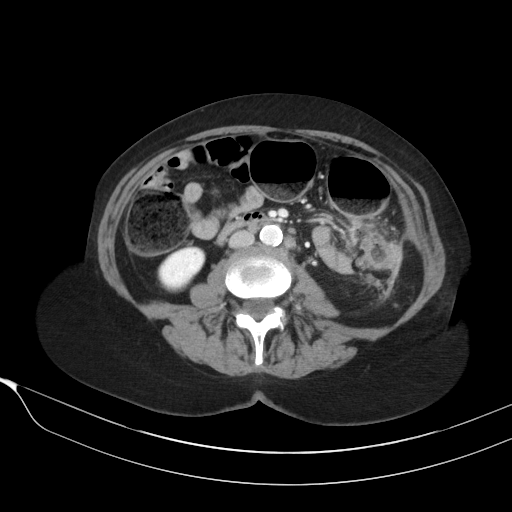
[im 58/90  soft-tissue]
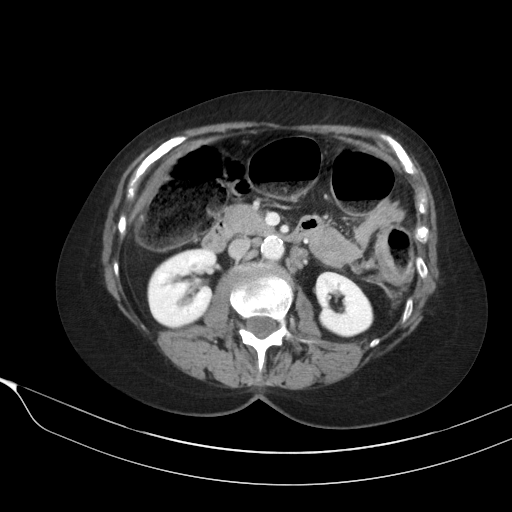
[im 64/90  soft-tissue]
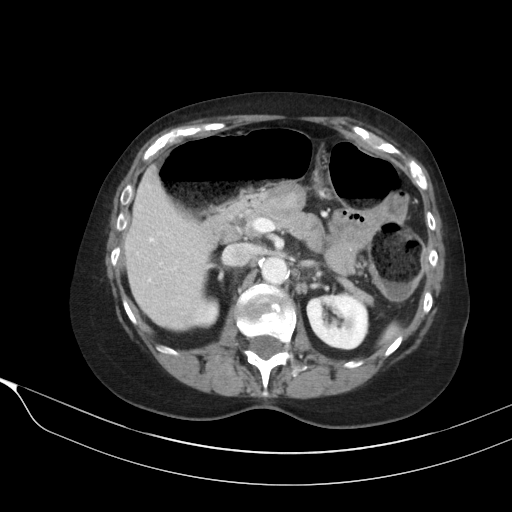
[im 64/90  lung]
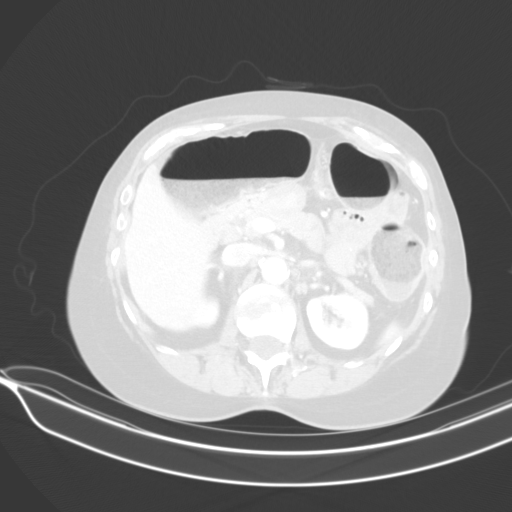
[im 64/90  bone]
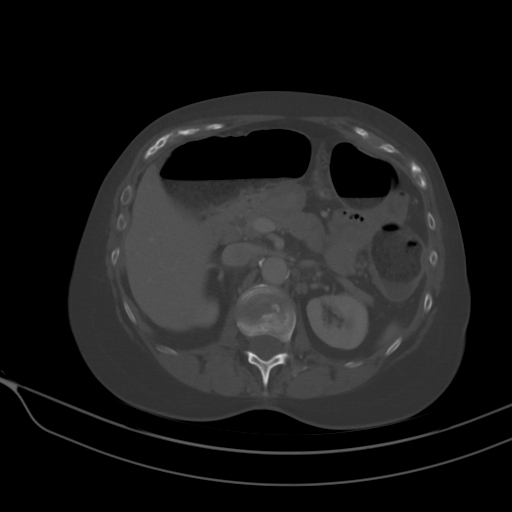
[im 70/90  soft-tissue]
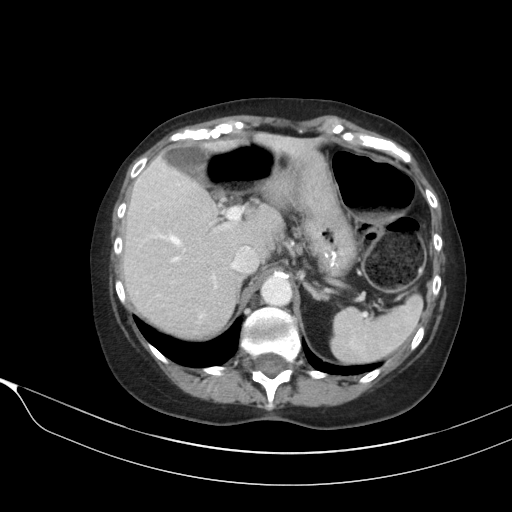
[im 70/90  lung]
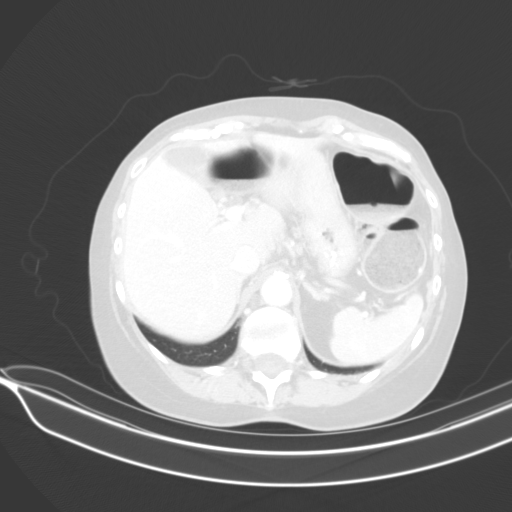
[im 77/90  soft-tissue]
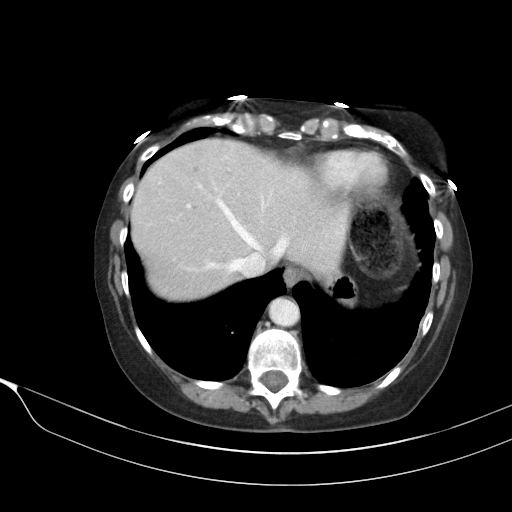
[im 77/90  lung]
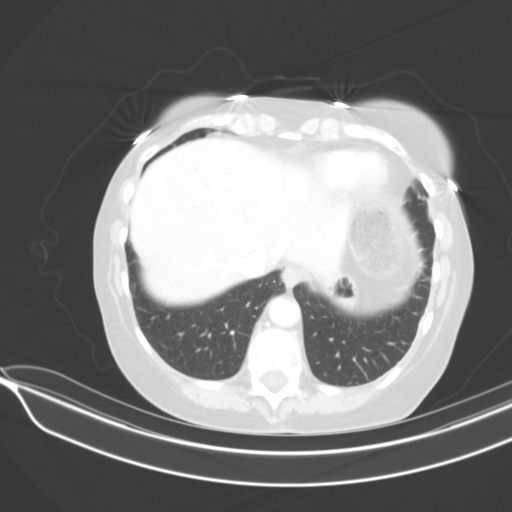
[im 83/90  soft-tissue]
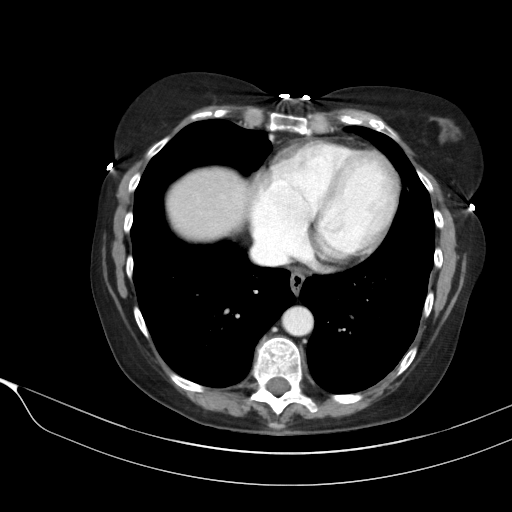
[im 83/90  lung]
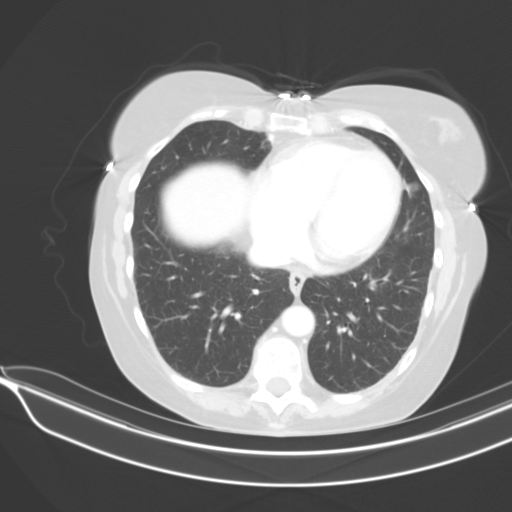

[12 of 32 positions shown; findings below may reference images not displayed]

FINDINGS: Lower chest: The visualized lung bases are clear.

No intra-abdominal free air. There is a small free fluid in the
pelvis.

Hepatobiliary: Several small hepatic hypodense lesions measure up to
7 mm in the dome of the liver, indeterminate and too small to
characterize. The liver is otherwise unremarkable. No intrahepatic
biliary ductal dilatation. The gallbladder is unremarkable.

Pancreas: Unremarkable. No pancreatic ductal dilatation or
surrounding inflammatory changes.

Spleen: Normal in size without focal abnormality.

Adrenals/Urinary Tract: The adrenal glands unremarkable. The
kidneys, visualized ureters, and urinary bladder appear
unremarkable.

Stomach/Bowel: There is a long segment masslike circumferential
thickening of the descending colon with associated high-grade
luminal narrowing and resulting colonic obstruction. This measures
at least 15 cm in length. There is stranding and nodularity of the
surrounding mesocolon. A superimposed acute inflammatory process
such as colitis is not excluded. There is apparent focal area of
discontinuity of the colonic wall with an ill-defined 8 mm extra
colonic collection containing tiny pockets of air (40/2), likely a
focally contained microperforation. No drainable fluid collection or
abscess. There is distention of the colon proximal to this segment.
No evidence of small-bowel dilatation. The appendix is normal.

Vascular/Lymphatic: Moderate aortoiliac atherosclerotic disease. The
IVC is unremarkable. No portal venous gas. There is retroperitoneal
adenopathy.

Reproductive: The uterus is anteverted and grossly unremarkable.

Other: None

Musculoskeletal: Degenerative changes of the spine. No acute osseous
pathology.
IMPRESSION: 1. Long segment masslike circumferential thickening of the
descending colon most consistent with malignancy. There is
associated high-grade luminal narrowing/stricture and resulting
colonic obstruction.
2. Small focal area of colonic wall defect in the medial mid
descending colon with evidence of focally contained
microperforation. No drainable fluid collection or abscess.
3. Retroperitoneal adenopathy.
4. Small hepatic hypodense lesions, indeterminate and too small to
characterize.
5. Aortic Atherosclerosis ([id number].P).

These results were called by telephone at the time of interpretation
on 05/26/2020 at [DATE] to provider Dr. Levinson, who verbally
acknowledged these results.

## 2022-05-31 MED ORDER — SODIUM CHLORIDE 0.9% FLUSH
10.0000 mL | Freq: Once | INTRAVENOUS | Status: AC
  Administered 2022-05-31: 10 mL

## 2022-05-31 MED ORDER — HEPARIN SOD (PORK) LOCK FLUSH 100 UNIT/ML IV SOLN
500.0000 [IU] | Freq: Once | INTRAVENOUS | Status: AC
  Administered 2022-05-31: 500 [IU]

## 2022-05-31 NOTE — Patient Instructions (Signed)

## 2022-06-06 DIAGNOSIS — H269 Unspecified cataract: Secondary | ICD-10-CM | POA: Diagnosis not present

## 2022-06-06 DIAGNOSIS — H2511 Age-related nuclear cataract, right eye: Secondary | ICD-10-CM | POA: Diagnosis not present

## 2022-06-11 ENCOUNTER — Other Ambulatory Visit: Payer: Self-pay

## 2022-06-11 ENCOUNTER — Other Ambulatory Visit (HOSPITAL_COMMUNITY): Payer: Self-pay

## 2022-06-17 ENCOUNTER — Other Ambulatory Visit: Payer: Self-pay | Admitting: Oncology

## 2022-06-19 ENCOUNTER — Inpatient Hospital Stay (HOSPITAL_BASED_OUTPATIENT_CLINIC_OR_DEPARTMENT_OTHER): Payer: Medicare Other | Admitting: Oncology

## 2022-06-19 ENCOUNTER — Inpatient Hospital Stay: Payer: Medicare Other

## 2022-06-19 ENCOUNTER — Inpatient Hospital Stay: Payer: Medicare Other | Attending: Oncology

## 2022-06-19 VITALS — BP 129/64 | HR 58

## 2022-06-19 VITALS — BP 146/73 | HR 77 | Temp 98.1°F | Resp 18 | Ht 66.0 in | Wt 153.4 lb

## 2022-06-19 DIAGNOSIS — C787 Secondary malignant neoplasm of liver and intrahepatic bile duct: Secondary | ICD-10-CM | POA: Diagnosis not present

## 2022-06-19 DIAGNOSIS — C185 Malignant neoplasm of splenic flexure: Secondary | ICD-10-CM

## 2022-06-19 DIAGNOSIS — E876 Hypokalemia: Secondary | ICD-10-CM | POA: Diagnosis not present

## 2022-06-19 DIAGNOSIS — C186 Malignant neoplasm of descending colon: Secondary | ICD-10-CM | POA: Insufficient documentation

## 2022-06-19 DIAGNOSIS — Z5111 Encounter for antineoplastic chemotherapy: Secondary | ICD-10-CM | POA: Insufficient documentation

## 2022-06-19 DIAGNOSIS — G629 Polyneuropathy, unspecified: Secondary | ICD-10-CM | POA: Insufficient documentation

## 2022-06-19 DIAGNOSIS — I4891 Unspecified atrial fibrillation: Secondary | ICD-10-CM | POA: Insufficient documentation

## 2022-06-19 DIAGNOSIS — R197 Diarrhea, unspecified: Secondary | ICD-10-CM | POA: Diagnosis not present

## 2022-06-19 DIAGNOSIS — D709 Neutropenia, unspecified: Secondary | ICD-10-CM | POA: Insufficient documentation

## 2022-06-19 LAB — CMP (CANCER CENTER ONLY)
ALT: 19 U/L (ref 0–44)
AST: 28 U/L (ref 15–41)
Albumin: 4.2 g/dL (ref 3.5–5.0)
Alkaline Phosphatase: 62 U/L (ref 38–126)
Anion gap: 9 (ref 5–15)
BUN: 27 mg/dL — ABNORMAL HIGH (ref 8–23)
CO2: 29 mmol/L (ref 22–32)
Calcium: 10 mg/dL (ref 8.9–10.3)
Chloride: 103 mmol/L (ref 98–111)
Creatinine: 1.07 mg/dL — ABNORMAL HIGH (ref 0.44–1.00)
GFR, Estimated: 53 mL/min — ABNORMAL LOW (ref >60)
Glucose, Bld: 98 mg/dL (ref 70–99)
Potassium: 4.5 mmol/L (ref 3.5–5.1)
Sodium: 141 mmol/L (ref 135–145)
Total Bilirubin: 0.4 mg/dL (ref 0.3–1.2)
Total Protein: 6.7 g/dL (ref 6.5–8.1)

## 2022-06-19 LAB — CEA (ACCESS): CEA (CHCC): 1.61 ng/mL (ref 0.00–5.00)

## 2022-06-19 LAB — CBC WITH DIFFERENTIAL (CANCER CENTER ONLY)
Abs Immature Granulocytes: 0.01 10*3/uL (ref 0.00–0.07)
Basophils Absolute: 0.1 10*3/uL (ref 0.0–0.1)
Basophils Relative: 1 %
Eosinophils Absolute: 0.4 10*3/uL (ref 0.0–0.5)
Eosinophils Relative: 8 %
HCT: 40.5 % (ref 36.0–46.0)
Hemoglobin: 13.7 g/dL (ref 12.0–15.0)
Immature Granulocytes: 0 %
Lymphocytes Relative: 36 %
Lymphs Abs: 1.7 10*3/uL (ref 0.7–4.0)
MCH: 35.9 pg — ABNORMAL HIGH (ref 26.0–34.0)
MCHC: 33.8 g/dL (ref 30.0–36.0)
MCV: 106 fL — ABNORMAL HIGH (ref 80.0–100.0)
Monocytes Absolute: 0.5 10*3/uL (ref 0.1–1.0)
Monocytes Relative: 10 %
Neutro Abs: 2.2 10*3/uL (ref 1.7–7.7)
Neutrophils Relative %: 45 %
Platelet Count: 190 10*3/uL (ref 150–400)
RBC: 3.82 MIL/uL — ABNORMAL LOW (ref 3.87–5.11)
RDW: 13.3 % (ref 11.5–15.5)
WBC Count: 4.7 10*3/uL (ref 4.0–10.5)
nRBC: 0 % (ref 0.0–0.2)

## 2022-06-19 MED ORDER — SODIUM CHLORIDE 0.9 % IV SOLN: Freq: Once | INTRAVENOUS | Status: AC

## 2022-06-19 MED ORDER — SODIUM CHLORIDE 0.9 % IV SOLN
7.5000 mg/kg | Freq: Once | INTRAVENOUS | Status: AC
  Administered 2022-06-19: 500 mg via INTRAVENOUS
  Filled 2022-06-19: qty 4

## 2022-06-19 MED ORDER — SODIUM CHLORIDE 0.9 % IV SOLN
1600.0000 mg/m2 | INTRAVENOUS | Status: DC
  Administered 2022-06-19: 2650 mg via INTRAVENOUS
  Filled 2022-06-19: qty 53

## 2022-06-19 MED ORDER — SODIUM CHLORIDE 0.9% FLUSH
10.0000 mL | INTRAVENOUS | Status: DC | PRN
  Administered 2022-06-19: 10 mL

## 2022-06-19 NOTE — Patient Instructions (Signed)
Kathy Robinson   Discharge Instructions: Thank you for choosing [facility] to provide your oncology and hematology care.   If you have a lab appointment with the [facility], please go directly to the [facility] and check in at the registration area.   Wear comfortable clothing and clothing appropriate for easy access to any Portacath or PICC line.   We strive to give you quality time with your provider. You may need to reschedule your appointment if you arrive late (15 or more minutes).  Arriving late affects you and other patients whose appointments are after yours.  Also, if you miss three or more appointments without notifying the office, you may be dismissed from the clinic at the provider's discretion.      For prescription refill requests, have your pharmacy contact our office and allow 72 hours for refills to be completed.    Today you received the following chemotherapy and/or immunotherapy agents Bevacizumab-bvzr (ZIRABEV) & Flourouracil (ADRUCIL).      To help prevent nausea and vomiting after your treatment, we encourage you to take your nausea medication as directed.  BELOW ARE SYMPTOMS THAT SHOULD BE REPORTED IMMEDIATELY: *FEVER GREATER THAN 100.4 F (38 C) OR HIGHER *CHILLS OR SWEATING *NAUSEA AND VOMITING THAT IS NOT CONTROLLED WITH YOUR NAUSEA MEDICATION *UNUSUAL SHORTNESS OF BREATH *UNUSUAL BRUISING OR BLEEDING *URINARY PROBLEMS (pain or burning when urinating, or frequent urination) *BOWEL PROBLEMS (unusual diarrhea, constipation, pain near the anus) TENDERNESS IN MOUTH AND THROAT WITH OR WITHOUT PRESENCE OF ULCERS (sore throat, sores in mouth, or a toothache) UNUSUAL RASH, SWELLING OR PAIN  UNUSUAL VAGINAL DISCHARGE OR ITCHING   Items with * indicate a potential emergency and should be followed up as soon as possible or go to the Emergency Department if any problems should occur.  Please show the CHEMOTHERAPY ALERT CARD or  IMMUNOTHERAPY ALERT CARD at check-in to the Emergency Department and triage nurse.  Should you have questions after your visit or need to cancel or reschedule your appointment, please contact [facility]  Dept: [phone]  and follow the prompts.  Office hours are 8:00 a.m. to 4:30 p.m. Monday - Friday. Please note that voicemails left after 4:00 p.m. may not be returned until the following business day.  We are closed weekends and major holidays. You have access to a nurse at all times for urgent questions. Please call the main number to the clinic Dept: [phone] and follow the prompts.   For any non-urgent questions, you may also contact your provider using MyChart. We now offer e-Visits for anyone 76 and older to request care online for non-urgent symptoms. For details visit [email or web address].   Also download the MyChart app! Go to the app store, search "MyChart", open the app, select [facility], and log in with your MyChart username and password.  Due to Covid, a mask is required upon entering the hospital/clinic. If you do not have a mask, one will be given to you upon arrival. For doctor visits, patients may have 1 support person aged 32 or older with them. For treatment visits, patients cannot have anyone with them due to current Covid guidelines and our immunocompromised population.   Bevacizumab injection What is this medication? BEVACIZUMAB (be va SIZ yoo mab) is a monoclonal antibody. It is used to treat many types of cancer. This medicine may be used for other purposes; ask your health care provider or pharmacist if you have questions.  COMMON BRAND NAME(S): Alymsys, Avastin, MVASI, [name] What should I tell my care team before I take this medication? They need to know if you have any of these conditions: diabetes heart disease high blood pressure history of coughing up blood prior anthracycline chemotherapy (e.g., doxorubicin, daunorubicin,  epirubicin) recent or ongoing radiation therapy recent or planning to have surgery stroke an unusual or allergic reaction to bevacizumab, hamster proteins, mouse proteins, other medicines, foods, dyes, or preservatives pregnant or trying to get pregnant breast-feeding How should I use this medication? This medicine is for infusion into a vein. It is given by a health care professional in a hospital or clinic setting. Talk to your pediatrician regarding the use of this medicine in children. Special care may be needed. Overdosage: If you think you have taken too much of this medicine contact a poison control center or emergency room at once. NOTE: This medicine is only for you. Do not share this medicine with others. What if I miss a dose? It is important not to miss your dose. Call your doctor or health care professional if you are unable to keep an appointment. What may interact with this medication? Interactions are not expected. This list may not describe all possible interactions. Give your health care provider a list of all the medicines, herbs, non-prescription drugs, or dietary supplements you use. Also tell them if you smoke, drink alcohol, or use illegal drugs. Some items may interact with your medicine. What should I watch for while using this medication? Your condition will be monitored carefully while you are receiving this medicine. You will need important blood work and urine testing done while you are taking this medicine. This medicine may increase your risk to bruise or bleed. Call your doctor or health care professional if you notice any unusual bleeding. Before having surgery, talk to your health care provider to make sure it is ok. This drug can increase the risk of poor healing of your surgical site or wound. You will need to stop this drug for 28 days before surgery. After surgery, wait at least 28 days before restarting this drug. Make sure the surgical site or wound is  healed enough before restarting this drug. Talk to your health care provider if questions. Do not become pregnant while taking this medicine or for 6 months after stopping it. Women should inform their doctor if they wish to become pregnant or think they might be pregnant. There is a potential for serious side effects to an unborn child. Talk to your health care professional or pharmacist for more information. Do not breast-feed an infant while taking this medicine and for 6 months after the last dose. This medicine has caused ovarian failure in some women. This medicine may interfere with the ability to have a child. You should talk to your doctor or health care professional if you are concerned about your fertility. What side effects may I notice from receiving this medication? Side effects that you should report to your doctor or health care professional as soon as possible: allergic reactions like skin rash, itching or hives, swelling of the face, lips, or tongue chest pain or chest tightness chills coughing up blood high fever seizures severe constipation signs and symptoms of bleeding such as bloody or black, tarry stools; red or dark-brown urine; spitting up blood or brown material that looks like coffee grounds; red spots on the skin; unusual bruising or bleeding from the eye, gums, or nose signs and symptoms of a blood  clot such as breathing problems; chest pain; severe, sudden headache; pain, swelling, warmth in the leg signs and symptoms of a stroke like changes in vision; confusion; trouble speaking or understanding; severe headaches; sudden numbness or weakness of the face, arm or leg; trouble walking; dizziness; loss of balance or coordination stomach pain sweating swelling of legs or ankles vomiting weight gain Side effects that usually do not require medical attention (report to your doctor or health care professional if they continue or are bothersome): back pain changes in  taste decreased appetite dry skin nausea tiredness This list may not describe all possible side effects. Call your doctor for medical advice about side effects. You may report side effects to FDA at 1-800-FDA-1088. Where should I keep my medication? This drug is given in a hospital or clinic and will not be stored at home. NOTE: This sheet is a summary. It may not cover all possible information. If you have questions about this medicine, talk to your doctor, pharmacist, or health care provider.  2022 Elsevier/Gold Standard (2021-07-25 00:00:00)  Fluorouracil, 5-FU injection What is this medication? FLUOROURACIL, 5-FU (flure oh YOOR a sil) is a chemotherapy drug. It slows the growth of cancer cells. This medicine is used to treat many types of cancer like breast cancer, colon or rectal cancer, pancreatic cancer, and stomach cancer. This medicine may be used for other purposes; ask your health care provider or pharmacist if you have questions. COMMON BRAND NAME(S): Adrucil What should I tell my care team before I take this medication? They need to know if you have any of these conditions: blood disorders dihydropyrimidine dehydrogenase (DPD) deficiency infection (especially a virus infection such as chickenpox, cold sores, or herpes) kidney disease liver disease malnourished, poor nutrition recent or ongoing radiation therapy an unusual or allergic reaction to fluorouracil, other chemotherapy, other medicines, foods, dyes, or preservatives pregnant or trying to get pregnant breast-feeding How should I use this medication? This drug is given as an infusion or injection into a vein. It is administered in a hospital or clinic by a specially trained health care professional. Talk to your pediatrician regarding the use of this medicine in children. Special care may be needed. Overdosage: If you think you have taken too much of this medicine contact a poison control center or emergency room  at once. NOTE: This medicine is only for you. Do not share this medicine with others. What if I miss a dose? It is important not to miss your dose. Call your doctor or health care professional if you are unable to keep an appointment. What may interact with this medication? Do not take this medicine with any of the following medications: live virus vaccines This medicine may also interact with the following medications: medicines that treat or prevent blood clots like warfarin, enoxaparin, and dalteparin This list may not describe all possible interactions. Give your health care provider a list of all the medicines, herbs, non-prescription drugs, or dietary supplements you use. Also tell them if you smoke, drink alcohol, or use illegal drugs. Some items may interact with your medicine. What should I watch for while using this medication? Visit your doctor for checks on your progress. This drug may make you feel generally unwell. This is not uncommon, as chemotherapy can affect healthy cells as well as cancer cells. Report any side effects. Continue your course of treatment even though you feel ill unless your doctor tells you to stop. In some cases, you may be given additional medicines  to help with side effects. Follow all directions for their use. Call your doctor or health care professional for advice if you get a fever, chills or sore throat, or other symptoms of a cold or flu. Do not treat yourself. This drug decreases your body's ability to fight infections. Try to avoid being around people who are sick. This medicine may increase your risk to bruise or bleed. Call your doctor or health care professional if you notice any unusual bleeding. Be careful brushing and flossing your teeth or using a toothpick because you may get an infection or bleed more easily. If you have any dental work done, tell your dentist you are receiving this medicine. Avoid taking products that contain aspirin,  acetaminophen, ibuprofen, naproxen, or ketoprofen unless instructed by your doctor. These medicines may hide a fever. Do not become pregnant while taking this medicine. Women should inform their doctor if they wish to become pregnant or think they might be pregnant. There is a potential for serious side effects to an unborn child. Talk to your health care professional or pharmacist for more information. Do not breast-feed an infant while taking this medicine. Men should inform their doctor if they wish to father a child. This medicine may lower sperm counts. Do not treat diarrhea with over the counter products. Contact your doctor if you have diarrhea that lasts more than 2 days or if it is severe and watery. This medicine can make you more sensitive to the sun. Keep out of the sun. If you cannot avoid being in the sun, wear protective clothing and use sunscreen. Do not use sun lamps or tanning beds/booths. What side effects may I notice from receiving this medication? Side effects that you should report to your doctor or health care professional as soon as possible: allergic reactions like skin rash, itching or hives, swelling of the face, lips, or tongue low blood counts - this medicine may decrease the number of white blood cells, red blood cells and platelets. You may be at increased risk for infections and bleeding. signs of infection - fever or chills, cough, sore throat, pain or difficulty passing urine signs of decreased platelets or bleeding - bruising, pinpoint red spots on the skin, black, tarry stools, blood in the urine signs of decreased red blood cells - unusually weak or tired, fainting spells, lightheadedness breathing problems changes in vision chest pain mouth sores nausea and vomiting pain, swelling, redness at site where injected pain, tingling, numbness in the hands or feet redness, swelling, or sores on hands or feet stomach pain unusual bleeding Side effects that usually  do not require medical attention (report to your doctor or health care professional if they continue or are bothersome): changes in finger or toe nails diarrhea dry or itchy skin hair loss headache loss of appetite sensitivity of eyes to the light stomach upset unusually teary eyes This list may not describe all possible side effects. Call your doctor for medical advice about side effects. You may report side effects to FDA at 1-800-FDA-1088. Where should I keep my medication? This drug is given in a hospital or clinic and will not be stored at home. NOTE: This sheet is a summary. It may not cover all possible information. If you have questions about this medicine, talk to your doctor, pharmacist, or health care provider.  2022 Elsevier/Gold Standard (2021-07-25 00:00:00)  The chemotherapy medication bag should finish at 46 hours, 96 hours, or 7 days. For example, if your pump is scheduled  for 46 hours and it was put on at 4:00 p.m., it should finish at 2:00 p.m. the day it is scheduled to come off regardless of your appointment time.     Estimated time to finish at  12:30p.m. on Thursday 06/21/22.   If the display on your pump reads "Low Volume" and it is beeping, take the batteries out of the pump and come to the cancer center for it to be taken off.   If the pump alarms go off prior to the pump reading "Low Volume" then call [phone] and someone can assist you.  If the plunger comes out and the chemotherapy medication is leaking out, please use your home chemo spill kit to clean up the spill. Do NOT use paper towels or other household products.  If you have problems or questions regarding your pump, please call either [phone] (24 hours a day) or the cancer center Monday-Friday 8:00 a.m.- 4:30 p.m. at the clinic number and we will assist you. If you are unable to get assistance, then go to the nearest Emergency Department and ask the staff to contact the IV team for  assistance.

## 2022-06-19 NOTE — Patient Instructions (Signed)

## 2022-06-19 NOTE — Progress Notes (Signed)
[facility] OFFICE PROGRESS NOTE   Diagnosis: Colon cancer  INTERVAL HISTORY:   Kathy Robinson complete another cycle of 5-FU/bevacizumab on 05/29/2022.  No nausea, mouth sores, diarrhea, bleeding, or symptom of thrombosis.  She feels well.  Stable neuropathy symptoms in the feet. She underwent a second cataract surgery 06/06/2022.  She reports improvement in vision.  Objective:  Vital signs in last 24 hours:  Blood pressure (!) 146/73, pulse 77, temperature 98.1 F (36.7 C), temperature source Oral, resp. rate 18, height $RemoveBe'5\' 6"'QnHsxvSOV$  (1.676 m), weight 153 lb 6.4 oz (69.6 kg), last menstrual period 11/19/1996, SpO2 99 %.    HEENT: No thrush or ulcers Resp: Lungs clear bilaterally Cardio: Regular rate and rhythm GI: No hepatosplenomegaly Vascular: No leg edema  Skin: Palms without erythema  Portacath/PICC-without erythema  Lab Results:  Lab Results  Component Value Date   WBC 4.7 06/19/2022   HGB 13.7 06/19/2022   HCT 40.5 06/19/2022   MCV 106.0 (H) 06/19/2022   PLT 190 06/19/2022   NEUTROABS 2.2 06/19/2022    CMP  Lab Results  Component Value Date   NA 141 06/19/2022   K 4.5 06/19/2022   CL 103 06/19/2022   CO2 29 06/19/2022   GLUCOSE 98 06/19/2022   BUN 27 (H) 06/19/2022   CREATININE 1.07 (H) 06/19/2022   CALCIUM 10.0 06/19/2022   PROT 6.7 06/19/2022   ALBUMIN 4.2 06/19/2022   AST 28 06/19/2022   ALT 19 06/19/2022   ALKPHOS 62 06/19/2022   BILITOT 0.4 06/19/2022   GFRNONAA 53 (L) 06/19/2022   GFRAA >60 08/16/2020    Lab Results  Component Value Date   CEA1 1.16 03/29/2021   CEA 1.61 06/19/2022    Medications: I have reviewed the patient's current medications.   Assessment/Plan: Adenocarcinoma the left colon, stage IIIc (pT4b,pN2a), status post a left colectomy 05/30/2020, MSS, TMB 13, BRAF V600E Tumor invades the visceral peritoneum, lymphovascular and perineural invasion present, for tumor deposits, 7/13 lymph nodes, negative resection  margins, no loss of mismatch repair protein expression CT abdomen/pelvis 05/26/2020-long segment of masslike thickening of the descending colon with associated high-grade colonic obstruction, small colonic wall defect with evidence of a focally contained microperforation, retroperitoneal adenopathy, indeterminate small hypodense liver lesions Elevated preoperative CEA PET 06/22/2020-hypermetabolic liver metastases, periportal and periaortic adenopathy, hypermetabolic mediastinal and left supraclavicular nodes Cycle 1 FOLFOX 07/05/20 Cycle 2 FOLFOXIRI (dose-reduced 5FU, no bolus) and Avastin 07/19/20  Cycle 3 FOLFOXIRI (Dose reduced 5-FU, no bolus)/Avastin 08/02/2020 Cycle 4 FOLFOXIRI/Avastin 08/16/2020 (Udenyca added) Cycle 5 FOLFOXIRI/Avastin 08/30/2020 CTs 09/09/2020-diminished size of lymph nodes in the chest, abdomen, and pelvis.  Decreased hepatic metastases.  No new evidence of disease progression, fat density lesion surrounding the left hemicolectomy Cycle 6 FOLFOXIRI/Avastin 09/13/2020 Cycle 7 FOLFOX/Avastin 09/27/2020 (irinotecan held) Cycle 8 FOLFOXIRI/Avastin 10/18/2020  Cycle 9 FOLFOXIRI/Avastin 11/01/2020 Cycle 10 FOLFOXIRI/Avastin 11/22/2020 (oxaliplatin held, irinotecan and 5-FU dose reduced) Cycle 11 FOLFOXIRI/Avastin 12/12/2020 (oxaliplatin held) CTs 12/19/2020-decrease in size of liver metastases, no evidence of disease progression Cycle 12 FOLFOXIRI/Avastin 01/04/2021 (oxaliplatin held) Cycle 13 FOLFOXIRI/Avastin 01/24/2021 (oxaliplatin held) Cycle 14 FOLFOXIRI/Avastin 02/14/2021 (oxaliplatin held) Cycle 15 FOLFOXIRI/Avastin 03/07/2021 (oxaliplatin held) Cycle 16 FOLFOXIRI/Avastin 03/29/2021 (oxaliplatin held) CTs 04/14/2021- decreased size of liver metastases.  No new or progressive findings. Cycle 17 FOLFOXIRI/Avastin 04/19/2021 (oxaliplatin held) Cycle 18 FOLFOXIRI/Avastin 05/09/2021 (oxaliplatin held) Cycle 19 FOLFOXIRI/Avastin 05/30/2021 (oxaliplatin held) Cycle 20 FOLFOXIRI/Avastin  06/20/2021 (oxaliplatin held) Cycle 21 FOLFOXIRI/Avastin 07/11/2021 (oxaliplatin held) CTs 07/28/2021-unchanged subcentimeter liver lesions, no evidence of new metastatic disease  Cycle 22 5-FU/ Avastin 08/01/2021 Cycle 23 5-FU/Avastin 08/22/2021 Cycle 24 5-FU/Avastin 09/12/2021 Cycle 25 5-FU/Avastin 10/03/2021 Cycle 26 5-FU/Avastin 10/24/2021 Cycle 27 5-FU/Avastin 11/15/2021 Cycle 28 5-FU/Avastin 12/05/2021 CTs 12/22/2021-grossly similar subcentimeter low-attenuation lesions in the liver compatible with treated metastasis.  No evidence for new metastatic disease in the chest, abdomen or pelvis. Cycle 29 5-FU/Avastin 12/26/2021 Cycle 30 5-FU/Avastin 01/16/2022 Cycle 41 5-FU/Avastin 02/06/2022 Cycle 42 5-FU/Avastin 02/27/2022 Cycle 43 5-FU/Avastin 03/20/2022 Cycle 44 5-FU/Avastin 04/10/2022 CT 04/28/2022-interval development of a 3 mm nodule within the left lower lobe and 3 mm nodule within the right lower lobe, nonspecific.  Unchanged subcentimeter low-attenuation lesions in the liver compatible with treated metastasis. 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  She continues to tolerate the 5-FU/Avastin well.  She will complete another cycle today.  She will return for an office visit and chemotherapy in 3 weeks.  The CEA remains normal and there is no clinical evidence of disease progression.  [name], MD  06/19/2022  10:59 AM

## 2022-06-19 NOTE — Progress Notes (Signed)
Patient seen by Dr. Benay Spice today  Vitals are within treatment parameters.  Labs reviewed by Dr. Benay Spice patient is ready for treatment and there are NO modifications to the treatment plan.  Per physician team, patient is ready for treatment and there are NO modifications to the treatment plan.

## 2022-06-20 ENCOUNTER — Other Ambulatory Visit: Payer: Self-pay

## 2022-06-21 ENCOUNTER — Inpatient Hospital Stay: Payer: Medicare Other

## 2022-06-21 VITALS — BP 136/81 | HR 79 | Temp 98.4°F | Resp 18

## 2022-06-21 DIAGNOSIS — C787 Secondary malignant neoplasm of liver and intrahepatic bile duct: Secondary | ICD-10-CM | POA: Diagnosis not present

## 2022-06-21 DIAGNOSIS — Z5111 Encounter for antineoplastic chemotherapy: Secondary | ICD-10-CM | POA: Diagnosis not present

## 2022-06-21 DIAGNOSIS — D709 Neutropenia, unspecified: Secondary | ICD-10-CM | POA: Diagnosis not present

## 2022-06-21 DIAGNOSIS — R197 Diarrhea, unspecified: Secondary | ICD-10-CM | POA: Diagnosis not present

## 2022-06-21 DIAGNOSIS — E876 Hypokalemia: Secondary | ICD-10-CM | POA: Diagnosis not present

## 2022-06-21 DIAGNOSIS — G629 Polyneuropathy, unspecified: Secondary | ICD-10-CM | POA: Diagnosis not present

## 2022-06-21 DIAGNOSIS — I4891 Unspecified atrial fibrillation: Secondary | ICD-10-CM | POA: Diagnosis not present

## 2022-06-21 DIAGNOSIS — C185 Malignant neoplasm of splenic flexure: Secondary | ICD-10-CM

## 2022-06-21 DIAGNOSIS — C186 Malignant neoplasm of descending colon: Secondary | ICD-10-CM | POA: Diagnosis not present

## 2022-06-21 MED ORDER — HEPARIN SOD (PORK) LOCK FLUSH 100 UNIT/ML IV SOLN
500.0000 [IU] | Freq: Once | INTRAVENOUS | Status: AC | PRN
  Administered 2022-06-21: 500 [IU]

## 2022-06-21 MED ORDER — SODIUM CHLORIDE 0.9% FLUSH
10.0000 mL | INTRAVENOUS | Status: DC | PRN
  Administered 2022-06-21: 10 mL

## 2022-06-21 NOTE — Patient Instructions (Signed)

## 2022-07-06 ENCOUNTER — Other Ambulatory Visit (HOSPITAL_COMMUNITY): Payer: Self-pay

## 2022-07-08 ENCOUNTER — Other Ambulatory Visit: Payer: Self-pay | Admitting: Oncology

## 2022-07-08 DIAGNOSIS — C185 Malignant neoplasm of splenic flexure: Secondary | ICD-10-CM

## 2022-07-08 NOTE — Progress Notes (Signed)
[facility] - Colorectal  No Change  Continue With Treatment as Ordered.  Original Decision Date/Time: 06/27/2020 15:01     A cycle is every 14 days:     Bevacizumab-xxxx      Oxaliplatin      Leucovorin      Fluorouracil      Fluorouracil   **Always confirm dose/schedule in your pharmacy ordering system**  Patient Characteristics: Distant Metastases, Nonsurgical Candidate, BRAF V600 Mutation Positive (KRAS/NRAS Wild-Type), Standard Cytotoxic/Targeted Therapy, First Line Standard Cytotoxic/Targeted Therapy Tumor Location: Colon Therapeutic Status: Distant Metastases Microsatellite/Mismatch Repair Status: MSS/pMMR BRAF Mutation Status: Mutation Positive KRAS/NRAS Mutation Status: Wild-Type (no mutation) Standard Cytotoxic/Targeted Line of Therapy: First Line Standard Cytotoxic/Targeted Therapy Intent of Therapy: Non-Curative / Palliative Intent, Discussed with Patient

## 2022-07-10 ENCOUNTER — Inpatient Hospital Stay (HOSPITAL_BASED_OUTPATIENT_CLINIC_OR_DEPARTMENT_OTHER): Payer: Medicare Other | Admitting: Nurse Practitioner

## 2022-07-10 ENCOUNTER — Inpatient Hospital Stay: Payer: Medicare Other

## 2022-07-10 ENCOUNTER — Encounter: Payer: Self-pay | Admitting: Nurse Practitioner

## 2022-07-10 ENCOUNTER — Encounter: Payer: Self-pay | Admitting: *Deleted

## 2022-07-10 VITALS — BP 141/70 | HR 70 | Temp 98.1°F | Resp 18 | Ht 66.0 in | Wt 155.0 lb

## 2022-07-10 DIAGNOSIS — E876 Hypokalemia: Secondary | ICD-10-CM | POA: Diagnosis not present

## 2022-07-10 DIAGNOSIS — C185 Malignant neoplasm of splenic flexure: Secondary | ICD-10-CM

## 2022-07-10 DIAGNOSIS — C787 Secondary malignant neoplasm of liver and intrahepatic bile duct: Secondary | ICD-10-CM | POA: Diagnosis not present

## 2022-07-10 DIAGNOSIS — G629 Polyneuropathy, unspecified: Secondary | ICD-10-CM | POA: Diagnosis not present

## 2022-07-10 DIAGNOSIS — Z5111 Encounter for antineoplastic chemotherapy: Secondary | ICD-10-CM | POA: Diagnosis not present

## 2022-07-10 DIAGNOSIS — C186 Malignant neoplasm of descending colon: Secondary | ICD-10-CM | POA: Diagnosis not present

## 2022-07-10 DIAGNOSIS — I4891 Unspecified atrial fibrillation: Secondary | ICD-10-CM | POA: Diagnosis not present

## 2022-07-10 DIAGNOSIS — R197 Diarrhea, unspecified: Secondary | ICD-10-CM | POA: Diagnosis not present

## 2022-07-10 DIAGNOSIS — D709 Neutropenia, unspecified: Secondary | ICD-10-CM | POA: Diagnosis not present

## 2022-07-10 LAB — CBC WITH DIFFERENTIAL (CANCER CENTER ONLY)
Abs Immature Granulocytes: 0 10*3/uL (ref 0.00–0.07)
Basophils Absolute: 0 10*3/uL (ref 0.0–0.1)
Basophils Relative: 1 %
Eosinophils Absolute: 0.2 10*3/uL (ref 0.0–0.5)
Eosinophils Relative: 5 %
HCT: 38.6 % (ref 36.0–46.0)
Hemoglobin: 13 g/dL (ref 12.0–15.0)
Immature Granulocytes: 0 %
Lymphocytes Relative: 36 %
Lymphs Abs: 1.8 10*3/uL (ref 0.7–4.0)
MCH: 35.6 pg — ABNORMAL HIGH (ref 26.0–34.0)
MCHC: 33.7 g/dL (ref 30.0–36.0)
MCV: 105.8 fL — ABNORMAL HIGH (ref 80.0–100.0)
Monocytes Absolute: 0.5 10*3/uL (ref 0.1–1.0)
Monocytes Relative: 11 %
Neutro Abs: 2.3 10*3/uL (ref 1.7–7.7)
Neutrophils Relative %: 47 %
Platelet Count: 220 10*3/uL (ref 150–400)
RBC: 3.65 MIL/uL — ABNORMAL LOW (ref 3.87–5.11)
RDW: 13.5 % (ref 11.5–15.5)
WBC Count: 4.9 10*3/uL (ref 4.0–10.5)
nRBC: 0 % (ref 0.0–0.2)

## 2022-07-10 LAB — TOTAL PROTEIN, URINE DIPSTICK: Protein, ur: NEGATIVE mg/dL

## 2022-07-10 LAB — CMP (CANCER CENTER ONLY)
ALT: 18 U/L (ref 0–44)
AST: 24 U/L (ref 15–41)
Albumin: 3.9 g/dL (ref 3.5–5.0)
Alkaline Phosphatase: 55 U/L (ref 38–126)
Anion gap: 7 (ref 5–15)
BUN: 28 mg/dL — ABNORMAL HIGH (ref 8–23)
CO2: 27 mmol/L (ref 22–32)
Calcium: 9.2 mg/dL (ref 8.9–10.3)
Chloride: 103 mmol/L (ref 98–111)
Creatinine: 1.07 mg/dL — ABNORMAL HIGH (ref 0.44–1.00)
GFR, Estimated: 53 mL/min — ABNORMAL LOW (ref >60)
Glucose, Bld: 82 mg/dL (ref 70–99)
Potassium: 4.4 mmol/L (ref 3.5–5.1)
Sodium: 137 mmol/L (ref 135–145)
Total Bilirubin: 0.4 mg/dL (ref 0.3–1.2)
Total Protein: 6.6 g/dL (ref 6.5–8.1)

## 2022-07-10 LAB — CEA (ACCESS): CEA (CHCC): 1.74 ng/mL (ref 0.00–5.00)

## 2022-07-10 MED ORDER — SODIUM CHLORIDE 0.9 % IV SOLN
1600.0000 mg/m2 | INTRAVENOUS | Status: DC
  Administered 2022-07-10: 2900 mg via INTRAVENOUS
  Filled 2022-07-10: qty 58

## 2022-07-10 MED ORDER — SODIUM CHLORIDE 0.9 % IV SOLN: Freq: Once | INTRAVENOUS | Status: AC

## 2022-07-10 MED ORDER — SODIUM CHLORIDE 0.9 % IV SOLN
7.5000 mg/kg | Freq: Once | INTRAVENOUS | Status: AC
  Administered 2022-07-10: 500 mg via INTRAVENOUS
  Filled 2022-07-10: qty 16

## 2022-07-10 NOTE — Patient Instructions (Signed)
Kathy Robinson   Discharge Instructions: Thank you for choosing [facility] to provide your oncology and hematology care.   If you have a lab appointment with the [facility], please go directly to the [facility] and check in at the registration area.   Wear comfortable clothing and clothing appropriate for easy access to any Portacath or PICC line.   We strive to give you quality time with your provider. You may need to reschedule your appointment if you arrive late (15 or more minutes).  Arriving late affects you and other patients whose appointments are after yours.  Also, if you miss three or more appointments without notifying the office, you may be dismissed from the clinic at the provider's discretion.      For prescription refill requests, have your pharmacy contact our office and allow 72 hours for refills to be completed.    Today you received the following chemotherapy and/or immunotherapy agents Bevacizumab-bvzr (ZIRABEV) & Flourouracil (ADRUCIL).      To help prevent nausea and vomiting after your treatment, we encourage you to take your nausea medication as directed.  BELOW ARE SYMPTOMS THAT SHOULD BE REPORTED IMMEDIATELY: *FEVER GREATER THAN 100.4 F (38 C) OR HIGHER *CHILLS OR SWEATING *NAUSEA AND VOMITING THAT IS NOT CONTROLLED WITH YOUR NAUSEA MEDICATION *UNUSUAL SHORTNESS OF BREATH *UNUSUAL BRUISING OR BLEEDING *URINARY PROBLEMS (pain or burning when urinating, or frequent urination) *BOWEL PROBLEMS (unusual diarrhea, constipation, pain near the anus) TENDERNESS IN MOUTH AND THROAT WITH OR WITHOUT PRESENCE OF ULCERS (sore throat, sores in mouth, or a toothache) UNUSUAL RASH, SWELLING OR PAIN  UNUSUAL VAGINAL DISCHARGE OR ITCHING   Items with * indicate a potential emergency and should be followed up as soon as possible or go to the Emergency Department if any problems should occur.  Please show the CHEMOTHERAPY ALERT CARD or  IMMUNOTHERAPY ALERT CARD at check-in to the Emergency Department and triage nurse.  Should you have questions after your visit or need to cancel or reschedule your appointment, please contact [facility]  Dept: [phone]  and follow the prompts.  Office hours are 8:00 a.m. to 4:30 p.m. Monday - Friday. Please note that voicemails left after 4:00 p.m. may not be returned until the following business day.  We are closed weekends and major holidays. You have access to a nurse at all times for urgent questions. Please call the main number to the clinic Dept: [phone] and follow the prompts.   For any non-urgent questions, you may also contact your provider using MyChart. We now offer e-Visits for anyone 64 and older to request care online for non-urgent symptoms. For details visit [email or web address].   Also download the MyChart app! Go to the app store, search "MyChart", open the app, select [facility], and log in with your MyChart username and password.  Masks are optional in the cancer centers. If you would like for your care team to wear a mask while they are taking care of you, please let them know. You may have one support person who is at least 80 years old accompany you for your appointments.  Bevacizumab Injection What is this medication? BEVACIZUMAB (be va SIZ yoo mab) treats some types of cancer. It works by blocking a protein that causes cancer cells to grow and multiply. This helps to slow or stop the spread of cancer cells. It is a monoclonal antibody. This medicine may be used for other purposes; ask  your health care provider or pharmacist if you have questions. COMMON BRAND NAME(S): Alymsys, Avastin, MVASI, [name] What should I tell my care team before I take this medication? They need to know if you have any of these conditions: Blood clots Coughing up blood Having or recent surgery Heart failure High blood pressure History of a connection  between 2 or more body parts that do not usually connect (fistula) History of a tear in your stomach or intestines Protein in your urine An unusual or allergic reaction to bevacizumab, other medications, foods, dyes, or preservatives Pregnant or trying to get pregnant Breast-feeding How should I use this medication? This medication is injected into a vein. It is given by your care team in a hospital or clinic setting. Talk to your care team the use of this medication in children. Special care may be needed. Overdosage: If you think you have taken too much of this medicine contact a poison control center or emergency room at once. NOTE: This medicine is only for you. Do not share this medicine with others. What if I miss a dose? Keep appointments for follow-up doses. It is important not to miss your dose. Call your care team if you are unable to keep an appointment. What may interact with this medication? Interactions are not expected. This list may not describe all possible interactions. Give your health care provider a list of all the medicines, herbs, non-prescription drugs, or dietary supplements you use. Also tell them if you smoke, drink alcohol, or use illegal drugs. Some items may interact with your medicine. What should I watch for while using this medication? Your condition will be monitored carefully while you are receiving this medication. You may need blood work while taking this medication. This medication may make you feel generally unwell. This is not uncommon as chemotherapy can affect healthy cells as well as cancer cells. Report any side effects. Continue your course of treatment even though you feel ill unless your care team tells you to stop. This medication may increase your risk to bruise or bleed. Call your care team if you notice any unusual bleeding. Before having surgery, talk to your care team to make sure it is ok. This medication can increase the risk of poor healing  of your surgical site or wound. You will need to stop this medication for 28 days before surgery. After surgery, wait at least 28 days before restarting this medication. Make sure the surgical site or wound is healed enough before restarting this medication. Talk to your care team if questions. Talk to your care team if you may be pregnant. Serious birth defects can occur if you take this medication during pregnancy and for 6 months after the last dose. Contraception is recommended while taking this medication and for 6 months after the last dose. Your care team can help you find the option that works for you. Do not breastfeed while taking this medication and for 6 months after the last dose. This medication can cause infertility. Talk to your care team if you are concerned about your fertility. What side effects may I notice from receiving this medication? Side effects that you should report to your care team as soon as possible: Allergic reactions--skin rash, itching, hives, swelling of the face, lips, tongue, or throat Bleeding--bloody or black, tar-like stools, vomiting blood or brown material that looks like coffee grounds, red or dark brown urine, small red or purple spots on skin, unusual bruising or bleeding Blood clot--pain, swelling,  or warmth in the leg, shortness of breath, chest pain Heart attack--pain or tightness in the chest, shoulders, arms, or jaw, nausea, shortness of breath, cold or clammy skin, feeling faint or lightheaded Heart failure--shortness of breath, swelling of the ankles, feet, or hands, sudden weight gain, unusual weakness or fatigue Increase in blood pressure Infection--fever, chills, cough, sore throat, wounds that don't heal, pain or trouble when passing urine, general feeling of discomfort or being unwell Infusion reactions--chest pain, shortness of breath or trouble breathing, feeling faint or lightheaded Kidney injury--decrease in the amount of urine, swelling of  the ankles, hands, or feet Stomach pain that is severe, does not go away, or gets worse Stroke--sudden numbness or weakness of the face, arm, or leg, trouble speaking, confusion, trouble walking, loss of balance or coordination, dizziness, severe headache, change in vision Sudden and severe headache, confusion, change in vision, seizures, which may be signs of posterior reversible encephalopathy syndrome (PRES) Side effects that usually do not require medical attention (report to your care team if they continue or are bothersome): Back pain Change in taste Diarrhea Dry skin Increased tears Nosebleed This list may not describe all possible side effects. Call your doctor for medical advice about side effects. You may report side effects to FDA at 1-800-FDA-1088. Where should I keep my medication? This medication is given in a hospital or clinic. It will not be stored at home. NOTE: This sheet is a summary. It may not cover all possible information. If you have questions about this medicine, talk to your doctor, pharmacist, or health care provider.  2023 Elsevier/Gold Standard (2022-03-20 00:00:00)  Fluorouracil Injection What is this medication? FLUOROURACIL (flure oh YOOR a sil) treats some types of cancer. It works by slowing down the growth of cancer cells. This medicine may be used for other purposes; ask your health care provider or pharmacist if you have questions. COMMON BRAND NAME(S): Adrucil What should I tell my care team before I take this medication? They need to know if you have any of these conditions: Blood disorders Dihydropyrimidine dehydrogenase (DPD) deficiency Infection, such as chickenpox, cold sores, herpes Kidney disease Liver disease Poor nutrition Recent or ongoing radiation therapy An unusual or allergic reaction to fluorouracil, other medications, foods, dyes, or preservatives If you or your partner are pregnant or trying to get pregnant Breast-feeding How  should I use this medication? This medication is injected into a vein. It is administered by your care team in a hospital or clinic setting. Talk to your care team about the use of this medication in children. Special care may be needed. Overdosage: If you think you have taken too much of this medicine contact a poison control center or emergency room at once. NOTE: This medicine is only for you. Do not share this medicine with others. What if I miss a dose? Keep appointments for follow-up doses. It is important not to miss your dose. Call your care team if you are unable to keep an appointment. What may interact with this medication? Do not take this medication with any of the following: Live virus vaccines This medication may also interact with the following: Medications that treat or prevent blood clots, such as warfarin, enoxaparin, dalteparin This list may not describe all possible interactions. Give your health care provider a list of all the medicines, herbs, non-prescription drugs, or dietary supplements you use. Also tell them if you smoke, drink alcohol, or use illegal drugs. Some items may interact with your medicine. What should  I watch for while using this medication? Your condition will be monitored carefully while you are receiving this medication. This medication may make you feel generally unwell. This is not uncommon as chemotherapy can affect healthy cells as well as cancer cells. Report any side effects. Continue your course of treatment even though you feel ill unless your care team tells you to stop. In some cases, you may be given additional medications to help with side effects. Follow all directions for their use. This medication may increase your risk of getting an infection. Call your care team for advice if you get a fever, chills, sore throat, or other symptoms of a cold or flu. Do not treat yourself. Try to avoid being around people who are sick. This medication may  increase your risk to bruise or bleed. Call your care team if you notice any unusual bleeding. Be careful brushing or flossing your teeth or using a toothpick because you may get an infection or bleed more easily. If you have any dental work done, tell your dentist you are receiving this medication. Avoid taking medications that contain aspirin, acetaminophen, ibuprofen, naproxen, or ketoprofen unless instructed by your care team. These medications may hide a fever. Do not treat diarrhea with over the counter products. Contact your care team if you have diarrhea that lasts more than 2 days or if it is severe and watery. This medication can make you more sensitive to the sun. Keep out of the sun. If you cannot avoid being in the sun, wear protective clothing and sunscreen. Do not use sun lamps, tanning beds, or tanning booths. Talk to your care team if you or your partner wish to become pregnant or think you might be pregnant. This medication can cause serious birth defects if taken during pregnancy and for 3 months after the last dose. A reliable form of contraception is recommended while taking this medication and for 3 months after the last dose. Talk to your care team about effective forms of contraception. Do not father a child while taking this medication and for 3 months after the last dose. Use a condom while having sex during this time period. Do not breastfeed while taking this medication. This medication may cause infertility. Talk to your care team if you are concerned about your fertility. What side effects may I notice from receiving this medication? Side effects that you should report to your care team as soon as possible: Allergic reactions--skin rash, itching, hives, swelling of the face, lips, tongue, or throat Heart attack--pain or tightness in the chest, shoulders, arms, or jaw, nausea, shortness of breath, cold or clammy skin, feeling faint or lightheaded Heart failure--shortness of  breath, swelling of the ankles, feet, or hands, sudden weight gain, unusual weakness or fatigue Heart rhythm changes--fast or irregular heartbeat, dizziness, feeling faint or lightheaded, chest pain, trouble breathing High ammonia level--unusual weakness or fatigue, confusion, loss of appetite, nausea, vomiting, seizures Infection--fever, chills, cough, sore throat, wounds that don't heal, pain or trouble when passing urine, general feeling of discomfort or being unwell Low red blood cell level--unusual weakness or fatigue, dizziness, headache, trouble breathing Pain, tingling, or numbness in the hands or feet, muscle weakness, change in vision, confusion or trouble speaking, loss of balance or coordination, trouble walking, seizures Redness, swelling, and blistering of the skin over hands and feet Severe or prolonged diarrhea Unusual bruising or bleeding Side effects that usually do not require medical attention (report to your care team if they continue or  are bothersome): Dry skin Headache Increased tears Nausea Pain, redness, or swelling with sores inside the mouth or throat Sensitivity to light Vomiting This list may not describe all possible side effects. Call your doctor for medical advice about side effects. You may report side effects to FDA at 1-800-FDA-1088. Where should I keep my medication? This medication is given in a hospital or clinic. It will not be stored at home. NOTE: This sheet is a summary. It may not cover all possible information. If you have questions about this medicine, talk to your doctor, pharmacist, or health care provider.  2023 Elsevier/Gold Standard (2022-03-13 00:00:00)  The chemotherapy medication bag should finish at 46 hours, 96 hours, or 7 days. For example, if your pump is scheduled for 46 hours and it was put on at 4:00 p.m., it should finish at 2:00 p.m. the day it is scheduled to come off regardless of your appointment time.     Estimated time to  finish at 10:45 a.m. on Thursday 07/12/2022.   If the display on your pump reads "Low Volume" and it is beeping, take the batteries out of the pump and come to the cancer center for it to be taken off.   If the pump alarms go off prior to the pump reading "Low Volume" then call [phone] and someone can assist you.  If the plunger comes out and the chemotherapy medication is leaking out, please use your home chemo spill kit to clean up the spill. Do NOT use paper towels or other household products.  If you have problems or questions regarding your pump, please call either [phone] (24 hours a day) or the cancer center Monday-Friday 8:00 a.m.- 4:30 p.m. at the clinic number and we will assist you. If you are unable to get assistance, then go to the nearest Emergency Department and ask the staff to contact the IV team for assistance.

## 2022-07-10 NOTE — Progress Notes (Signed)
[facility] OFFICE PROGRESS NOTE   Diagnosis: Colon cancer  INTERVAL HISTORY:   Kathy Robinson returns as scheduled.  She completed another cycle of 5-FU/bevacizumab 06/19/2022.  She denies nausea/vomiting.  No mouth sores.  No diarrhea.  No hand or foot pain or redness.  No bleeding.  Neuropathy symptoms vary from day-to-day but overall remain stable.  Objective:  Vital signs in last 24 hours:  Blood pressure (!) 141/70, pulse 70, temperature 98.1 F (36.7 C), temperature source Oral, resp. rate 18, height $RemoveBe'5\' 6"'XvqEJFBve$  (1.676 m), weight 155 lb (70.3 kg), last menstrual period 11/19/1996, SpO2 100 %.    HEENT: No thrush or ulcers. Resp: Lungs clear bilaterally. Cardio: Regular rate and rhythm. GI: No hepatosplenomegaly. Vascular: No leg edema. Skin: Palms without erythema. Port-A-Cath without erythema.   Lab Results:  Lab Results  Component Value Date   WBC 4.9 07/10/2022   HGB 13.0 07/10/2022   HCT 38.6 07/10/2022   MCV 105.8 (H) 07/10/2022   PLT 220 07/10/2022   NEUTROABS 2.3 07/10/2022    Imaging:  No results found.  Medications: I have reviewed the patient's current medications.  Assessment/Plan: Adenocarcinoma the left colon, stage IIIc (pT4b,pN2a), status post a left colectomy 05/30/2020, MSS, TMB 13, BRAF V600E Tumor invades the visceral peritoneum, lymphovascular and perineural invasion present, for tumor deposits, 7/13 lymph nodes, negative resection margins, no loss of mismatch repair protein expression CT abdomen/pelvis 05/26/2020-long segment of masslike thickening of the descending colon with associated high-grade colonic obstruction, small colonic wall defect with evidence of a focally contained microperforation, retroperitoneal adenopathy, indeterminate small hypodense liver lesions Elevated preoperative CEA PET 06/22/2020-hypermetabolic liver metastases, periportal and periaortic adenopathy, hypermetabolic mediastinal and left supraclavicular  nodes Cycle 1 FOLFOX 07/05/20 Cycle 2 FOLFOXIRI (dose-reduced 5FU, no bolus) and Avastin 07/19/20  Cycle 3 FOLFOXIRI (Dose reduced 5-FU, no bolus)/Avastin 08/02/2020 Cycle 4 FOLFOXIRI/Avastin 08/16/2020 (Udenyca added) Cycle 5 FOLFOXIRI/Avastin 08/30/2020 CTs 09/09/2020-diminished size of lymph nodes in the chest, abdomen, and pelvis.  Decreased hepatic metastases.  No new evidence of disease progression, fat density lesion surrounding the left hemicolectomy Cycle 6 FOLFOXIRI/Avastin 09/13/2020 Cycle 7 FOLFOX/Avastin 09/27/2020 (irinotecan held) Cycle 8 FOLFOXIRI/Avastin 10/18/2020  Cycle 9 FOLFOXIRI/Avastin 11/01/2020 Cycle 10 FOLFOXIRI/Avastin 11/22/2020 (oxaliplatin held, irinotecan and 5-FU dose reduced) Cycle 11 FOLFOXIRI/Avastin 12/12/2020 (oxaliplatin held) CTs 12/19/2020-decrease in size of liver metastases, no evidence of disease progression Cycle 12 FOLFOXIRI/Avastin 01/04/2021 (oxaliplatin held) Cycle 13 FOLFOXIRI/Avastin 01/24/2021 (oxaliplatin held) Cycle 14 FOLFOXIRI/Avastin 02/14/2021 (oxaliplatin held) Cycle 15 FOLFOXIRI/Avastin 03/07/2021 (oxaliplatin held) Cycle 16 FOLFOXIRI/Avastin 03/29/2021 (oxaliplatin held) CTs 04/14/2021- decreased size of liver metastases.  No new or progressive findings. Cycle 17 FOLFOXIRI/Avastin 04/19/2021 (oxaliplatin held) Cycle 18 FOLFOXIRI/Avastin 05/09/2021 (oxaliplatin held) Cycle 19 FOLFOXIRI/Avastin 05/30/2021 (oxaliplatin held) Cycle 20 FOLFOXIRI/Avastin 06/20/2021 (oxaliplatin held) Cycle 21 FOLFOXIRI/Avastin 07/11/2021 (oxaliplatin held) CTs 07/28/2021-unchanged subcentimeter liver lesions, no evidence of new metastatic disease Cycle 22 5-FU/ Avastin 08/01/2021 Cycle 23 5-FU/Avastin 08/22/2021 Cycle 24 5-FU/Avastin 09/12/2021 Cycle 25 5-FU/Avastin 10/03/2021 Cycle 26 5-FU/Avastin 10/24/2021 Cycle 27 5-FU/Avastin 11/15/2021 Cycle 28 5-FU/Avastin 12/05/2021 CTs 12/22/2021-grossly similar subcentimeter low-attenuation lesions in the liver compatible with treated  metastasis.  No evidence for new metastatic disease in the chest, abdomen or pelvis. Cycle 29 5-FU/Avastin 12/26/2021 Cycle 30 5-FU/Avastin 01/16/2022 Cycle 41 5-FU/Avastin 02/06/2022 Cycle 42 5-FU/Avastin 02/27/2022 Cycle 43 5-FU/Avastin 03/20/2022 Cycle 44 5-FU/Avastin 04/10/2022 CT 04/28/2022-interval development of a 3 mm nodule within the left lower lobe and 3 mm nodule within the right lower lobe, nonspecific.  Unchanged subcentimeter low-attenuation lesions in the  liver compatible with treated metastasis. 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  She is on active treatment with 5-FU/Avastin every 3 weeks.  There is no clinical evidence of disease progression.  She is tolerating treatment well.  Plan to continue the same, treatment today.  CBC and chemistry panel from today reviewed.  Labs adequate to proceed as above.  Urine is negative for protein.  CEA is stable.  She will return for follow-up and treatment in 3 weeks.    Ned Card ANP/GNP-BC   07/10/2022  10:43 AM

## 2022-07-10 NOTE — Progress Notes (Signed)
Patient seen by Ned Card NP today  Vitals are within treatment parameters. Provider aware of SBP 141--intervention not indicated Labs reviewed by Ned Card NP and are within treatment parameters.  Per physician team, patient is ready for treatment and there are NO modifications to the treatment plan.

## 2022-07-12 ENCOUNTER — Inpatient Hospital Stay: Payer: Medicare Other

## 2022-07-12 VITALS — BP 134/82 | HR 74 | Temp 98.0°F | Resp 18

## 2022-07-12 DIAGNOSIS — E876 Hypokalemia: Secondary | ICD-10-CM | POA: Diagnosis not present

## 2022-07-12 DIAGNOSIS — G629 Polyneuropathy, unspecified: Secondary | ICD-10-CM | POA: Diagnosis not present

## 2022-07-12 DIAGNOSIS — I4891 Unspecified atrial fibrillation: Secondary | ICD-10-CM | POA: Diagnosis not present

## 2022-07-12 DIAGNOSIS — C787 Secondary malignant neoplasm of liver and intrahepatic bile duct: Secondary | ICD-10-CM | POA: Diagnosis not present

## 2022-07-12 DIAGNOSIS — C186 Malignant neoplasm of descending colon: Secondary | ICD-10-CM | POA: Diagnosis not present

## 2022-07-12 DIAGNOSIS — R197 Diarrhea, unspecified: Secondary | ICD-10-CM | POA: Diagnosis not present

## 2022-07-12 DIAGNOSIS — D709 Neutropenia, unspecified: Secondary | ICD-10-CM | POA: Diagnosis not present

## 2022-07-12 DIAGNOSIS — C185 Malignant neoplasm of splenic flexure: Secondary | ICD-10-CM

## 2022-07-12 DIAGNOSIS — Z5111 Encounter for antineoplastic chemotherapy: Secondary | ICD-10-CM | POA: Diagnosis not present

## 2022-07-12 MED ORDER — HEPARIN SOD (PORK) LOCK FLUSH 100 UNIT/ML IV SOLN
500.0000 [IU] | Freq: Once | INTRAVENOUS | Status: AC | PRN
  Administered 2022-07-12: 500 [IU]

## 2022-07-12 MED ORDER — SODIUM CHLORIDE 0.9% FLUSH
10.0000 mL | INTRAVENOUS | Status: DC | PRN
  Administered 2022-07-12: 10 mL

## 2022-07-12 NOTE — Patient Instructions (Signed)
Heparin injection What is this medication? HEPARIN (HEP a rin) is an anticoagulant. It is used to treat or prevent clots in the veins, arteries, lungs, or heart. It stops clots from forming or getting bigger. This medicine prevents clotting during open-heart surgery, dialysis, or in patients who are confined to bed. This medicine may be used for other purposes; ask your health care provider or pharmacist if you have questions. COMMON BRAND NAME(S): Hep-Lock, Hep-Lock U/P, Hepflush-10, Monoject Prefill Advanced Heparin Lock Flush, SASH Normal Saline and Heparin What should I tell my care team before I take this medication? They need to know if you have any of these conditions: bleeding disorders, such as hemophilia or low blood platelets bowel disease or diverticulitis endocarditis high blood pressure liver disease recent surgery or delivery of a baby stomach ulcers an unusual or allergic reaction to heparin, benzyl alcohol, sulfites, other medicines, foods, dyes, or preservatives pregnant or trying to get pregnant breast-feeding How should I use this medication? This medicine is given by injection or infusion into a vein. It can also be given by injection of small amounts under the skin. It is usually given by a health care professional in a hospital or clinic setting. If you get this medicine at home, you will be taught how to prepare and give this medicine. Use exactly as directed. Take your medicine at regular intervals. Do not take it more often than directed. Do not stop taking except on your doctor's advice. Stopping this medicine may increase your risk of a blot clot. Be sure to refill your prescription before you run out of medicine. It is important that you put your used needles and syringes in a special sharps container. Do not put them in a trash can. If you do not have a sharps container, call your pharmacist or healthcare provider to get one. Talk to your pediatrician regarding the  use of this medicine in children. While this medicine may be prescribed for children for selected conditions, precautions do apply. Overdosage: If you think you have taken too much of this medicine contact a poison control center or emergency room at once. NOTE: This medicine is only for you. Do not share this medicine with others. What if I miss a dose? If you miss a dose, take it as soon as you can. If it is almost time for your next dose, take only that dose. Do not take double or extra doses. What may interact with this medication? Do not take this medicine with any of the following medications: aspirin and aspirin-like drugs mifepristone medicines that treat or prevent blood clots like warfarin, enoxaparin, and dalteparin palifermin protamine This medicine may also interact with the following medications: dextran digoxin hydroxychloroquine medicines for treating colds or allergies nicotine NSAIDs, medicines for pain and inflammation, like ibuprofen or naproxen phenylbutazone tetracycline antibiotics This list may not describe all possible interactions. Give your health care provider a list of all the medicines, herbs, non-prescription drugs, or dietary supplements you use. Also tell them if you smoke, drink alcohol, or use illegal drugs. Some items may interact with your medicine. What should I watch for while using this medication? Visit your healthcare professional for regular checks on your progress. You may need blood work done while you are taking this medicine. Your condition will be monitored carefully while you are receiving this medicine. It is important not to miss any appointments. Wear a medical ID bracelet or chain, and carry a card that describes your disease and details   of your medicine and dosage times. Notify your doctor or healthcare professional at once if you have cold, blue hands or feet. If you are going to need surgery or other procedure, tell your healthcare  professional that you are using this medicine. Avoid sports and activities that might cause injury while you are using this medicine. Severe falls or injuries can cause unseen bleeding. Be careful when using sharp tools or knives. Consider using an electric razor. Take special care brushing or flossing your teeth. Report any injuries, bruising, or red spots on the skin to your healthcare professional. Using this medicine for a long time may weaken your bones and increase the risk of bone fractures. You should make sure that you get enough calcium and vitamin D while you are taking this medicine. Discuss the foods you eat and the vitamins you take with your healthcare professional. Wear a medical ID bracelet or chain. Carry a card that describes your disease and details of your medicine and dosage times. What side effects may I notice from receiving this medication? Side effects that you should report to your doctor or health care professional as soon as possible: allergic reactions like skin rash, itching or hives, swelling of the face, lips, or tongue bone pain fever, chills nausea, vomiting signs and symptoms of bleeding such as bloody or black, tarry stools; red or dark-brown urine; spitting up blood or brown material that looks like coffee grounds; red spots on the skin; unusual bruising or bleeding from the eye, gums, or nose signs and symptoms of a blood clot such as chest pain; shortness of breath; pain, swelling, or warmth in the leg signs and symptoms of a stroke such as changes in vision; confusion; trouble speaking or understanding; severe headaches; sudden numbness or weakness of the face, arm or leg; trouble walking; dizziness; loss of coordination Side effects that usually do not require medical attention (report to your doctor or health care professional if they continue or are bothersome): hair loss pain, redness, or irritation at site where injected This list may not describe all  possible side effects. Call your doctor for medical advice about side effects. You may report side effects to FDA at 1-800-FDA-1088. Where should I keep my medication? Keep out of the reach of children. Store unopened vials at room temperature between 15 and 30 degrees C (59 and 86 degrees F). Do not freeze. Do not use if solution is discolored or particulate matter is present. Throw away any unused medicine after the expiration date. NOTE: This sheet is a summary. It may not cover all possible information. If you have questions about this medicine, talk to your doctor, pharmacist, or health care provider.  2023 Elsevier/Gold Standard (2005-08-13 00:00:00)  

## 2022-07-16 DIAGNOSIS — Z961 Presence of intraocular lens: Secondary | ICD-10-CM | POA: Diagnosis not present

## 2022-07-31 ENCOUNTER — Encounter: Payer: Self-pay | Admitting: Nurse Practitioner

## 2022-07-31 ENCOUNTER — Inpatient Hospital Stay: Payer: Medicare Other

## 2022-07-31 ENCOUNTER — Inpatient Hospital Stay: Payer: Medicare Other | Attending: Oncology

## 2022-07-31 ENCOUNTER — Inpatient Hospital Stay (HOSPITAL_BASED_OUTPATIENT_CLINIC_OR_DEPARTMENT_OTHER): Payer: Medicare Other | Admitting: Nurse Practitioner

## 2022-07-31 VITALS — BP 144/75 | HR 60

## 2022-07-31 VITALS — BP 150/64 | HR 74 | Temp 98.1°F | Resp 20 | Ht 66.0 in | Wt 155.6 lb

## 2022-07-31 DIAGNOSIS — I4891 Unspecified atrial fibrillation: Secondary | ICD-10-CM | POA: Insufficient documentation

## 2022-07-31 DIAGNOSIS — C185 Malignant neoplasm of splenic flexure: Secondary | ICD-10-CM

## 2022-07-31 DIAGNOSIS — Z5111 Encounter for antineoplastic chemotherapy: Secondary | ICD-10-CM | POA: Insufficient documentation

## 2022-07-31 DIAGNOSIS — G629 Polyneuropathy, unspecified: Secondary | ICD-10-CM | POA: Insufficient documentation

## 2022-07-31 DIAGNOSIS — C186 Malignant neoplasm of descending colon: Secondary | ICD-10-CM | POA: Insufficient documentation

## 2022-07-31 DIAGNOSIS — E876 Hypokalemia: Secondary | ICD-10-CM | POA: Insufficient documentation

## 2022-07-31 DIAGNOSIS — R197 Diarrhea, unspecified: Secondary | ICD-10-CM | POA: Diagnosis not present

## 2022-07-31 LAB — CBC WITH DIFFERENTIAL (CANCER CENTER ONLY)
Abs Immature Granulocytes: 0.01 10*3/uL (ref 0.00–0.07)
Basophils Absolute: 0 10*3/uL (ref 0.0–0.1)
Basophils Relative: 1 %
Eosinophils Absolute: 0.3 10*3/uL (ref 0.0–0.5)
Eosinophils Relative: 6 %
HCT: 38.4 % (ref 36.0–46.0)
Hemoglobin: 13 g/dL (ref 12.0–15.0)
Immature Granulocytes: 0 %
Lymphocytes Relative: 35 %
Lymphs Abs: 1.6 10*3/uL (ref 0.7–4.0)
MCH: 35.8 pg — ABNORMAL HIGH (ref 26.0–34.0)
MCHC: 33.9 g/dL (ref 30.0–36.0)
MCV: 105.8 fL — ABNORMAL HIGH (ref 80.0–100.0)
Monocytes Absolute: 0.3 10*3/uL (ref 0.1–1.0)
Monocytes Relative: 8 %
Neutro Abs: 2.2 10*3/uL (ref 1.7–7.7)
Neutrophils Relative %: 50 %
Platelet Count: 207 10*3/uL (ref 150–400)
RBC: 3.63 MIL/uL — ABNORMAL LOW (ref 3.87–5.11)
RDW: 13.6 % (ref 11.5–15.5)
WBC Count: 4.4 10*3/uL (ref 4.0–10.5)
nRBC: 0 % (ref 0.0–0.2)

## 2022-07-31 LAB — CMP (CANCER CENTER ONLY)
ALT: 21 U/L (ref 0–44)
AST: 27 U/L (ref 15–41)
Albumin: 3.9 g/dL (ref 3.5–5.0)
Alkaline Phosphatase: 55 U/L (ref 38–126)
Anion gap: 8 (ref 5–15)
BUN: 22 mg/dL (ref 8–23)
CO2: 27 mmol/L (ref 22–32)
Calcium: 9.3 mg/dL (ref 8.9–10.3)
Chloride: 105 mmol/L (ref 98–111)
Creatinine: 0.94 mg/dL (ref 0.44–1.00)
GFR, Estimated: >60 mL/min (ref >60)
Glucose, Bld: 110 mg/dL — ABNORMAL HIGH (ref 70–99)
Potassium: 4.3 mmol/L (ref 3.5–5.1)
Sodium: 140 mmol/L (ref 135–145)
Total Bilirubin: 0.4 mg/dL (ref 0.3–1.2)
Total Protein: 6.5 g/dL (ref 6.5–8.1)

## 2022-07-31 LAB — CEA (ACCESS): CEA (CHCC): 1.65 ng/mL (ref 0.00–5.00)

## 2022-07-31 MED ORDER — SODIUM CHLORIDE 0.9 % IV SOLN
7.5000 mg/kg | Freq: Once | INTRAVENOUS | Status: AC
  Administered 2022-07-31: 500 mg via INTRAVENOUS
  Filled 2022-07-31: qty 4

## 2022-07-31 MED ORDER — SODIUM CHLORIDE 0.9 % IV SOLN: Freq: Once | INTRAVENOUS | Status: AC

## 2022-07-31 MED ORDER — SODIUM CHLORIDE 0.9 % IV SOLN
1600.0000 mg/m2 | INTRAVENOUS | Status: DC
  Administered 2022-07-31: 2900 mg via INTRAVENOUS
  Filled 2022-07-31: qty 40

## 2022-07-31 NOTE — Progress Notes (Signed)
[place] OFFICE PROGRESS NOTE   Diagnosis: Colon cancer  INTERVAL HISTORY:   Kathy Robinson returns as scheduled.  She completed another cycle of 5-FU/bevacizumab 07/10/2022.  She denies nausea/vomiting.  No mouth sores.  No diarrhea.  No bleeding.  She has a good appetite.  No pain.  Stable neuropathy symptoms.  Objective:  Vital signs in last 24 hours:  Blood pressure (!) 150/64, pulse 74, temperature 98.1 F (36.7 C), temperature source Oral, resp. rate 20, height $RemoveBe'5\' 6"'[id number]$  (1.676 m), weight 155 lb 9.6 oz (70.6 kg), last menstrual period 11/19/1996, SpO2 98 %.    HEENT: No thrush or ulcers. Resp: Lungs clear bilaterally. Cardio: Regular rate and rhythm. GI: Abdomen soft and nontender.  No hepatosplenomegaly. Vascular: No leg edema. Skin: Palms without erythema. Port cath without erythema.   Lab Results:  Lab Results  Component Value Date   WBC 4.4 07/31/2022   HGB 13.0 07/31/2022   HCT 38.4 07/31/2022   MCV 105.8 (H) 07/31/2022   PLT 207 07/31/2022   NEUTROABS 2.2 07/31/2022    Imaging:  No results found.  Medications: I have reviewed the patient's current medications.  Assessment/Plan: Adenocarcinoma the left colon, stage IIIc (pT4b,pN2a), status post a left colectomy 05/30/2020, MSS, TMB 13, BRAF V600E Tumor invades the visceral peritoneum, lymphovascular and perineural invasion present, for tumor deposits, 7/13 lymph nodes, negative resection margins, no loss of mismatch repair protein expression CT abdomen/pelvis 05/26/2020-long segment of masslike thickening of the descending colon with associated high-grade colonic obstruction, small colonic wall defect with evidence of a focally contained microperforation, retroperitoneal adenopathy, indeterminate small hypodense liver lesions Elevated preoperative CEA PET 06/22/2020-hypermetabolic liver metastases, periportal and periaortic adenopathy, hypermetabolic mediastinal and left supraclavicular  nodes Cycle 1 FOLFOX 07/05/20 Cycle 2 FOLFOXIRI (dose-reduced 5FU, no bolus) and Avastin 07/19/20  Cycle 3 FOLFOXIRI (Dose reduced 5-FU, no bolus)/Avastin 08/02/2020 Cycle 4 FOLFOXIRI/Avastin 08/16/2020 (Udenyca added) Cycle 5 FOLFOXIRI/Avastin 08/30/2020 CTs 09/09/2020-diminished size of lymph nodes in the chest, abdomen, and pelvis.  Decreased hepatic metastases.  No new evidence of disease progression, fat density lesion surrounding the left hemicolectomy Cycle 6 FOLFOXIRI/Avastin 09/13/2020 Cycle 7 FOLFOX/Avastin 09/27/2020 (irinotecan held) Cycle 8 FOLFOXIRI/Avastin 10/18/2020  Cycle 9 FOLFOXIRI/Avastin 11/01/2020 Cycle 10 FOLFOXIRI/Avastin 11/22/2020 (oxaliplatin held, irinotecan and 5-FU dose reduced) Cycle 11 FOLFOXIRI/Avastin 12/12/2020 (oxaliplatin held) CTs 12/19/2020-decrease in size of liver metastases, no evidence of disease progression Cycle 12 FOLFOXIRI/Avastin 01/04/2021 (oxaliplatin held) Cycle 13 FOLFOXIRI/Avastin 01/24/2021 (oxaliplatin held) Cycle 14 FOLFOXIRI/Avastin 02/14/2021 (oxaliplatin held) Cycle 15 FOLFOXIRI/Avastin 03/07/2021 (oxaliplatin held) Cycle 16 FOLFOXIRI/Avastin 03/29/2021 (oxaliplatin held) CTs 04/14/2021- decreased size of liver metastases.  No new or progressive findings. Cycle 17 FOLFOXIRI/Avastin 04/19/2021 (oxaliplatin held) Cycle 18 FOLFOXIRI/Avastin 05/09/2021 (oxaliplatin held) Cycle 19 FOLFOXIRI/Avastin 05/30/2021 (oxaliplatin held) Cycle 20 FOLFOXIRI/Avastin 06/20/2021 (oxaliplatin held) Cycle 21 FOLFOXIRI/Avastin 07/11/2021 (oxaliplatin held) CTs 07/28/2021-unchanged subcentimeter liver lesions, no evidence of new metastatic disease Cycle 22 5-FU/ Avastin 08/01/2021 Cycle 23 5-FU/Avastin 08/22/2021 Cycle 24 5-FU/Avastin 09/12/2021 Cycle 25 5-FU/Avastin 10/03/2021 Cycle 26 5-FU/Avastin 10/24/2021 Cycle 27 5-FU/Avastin 11/15/2021 Cycle 28 5-FU/Avastin 12/05/2021 CTs 12/22/2021-grossly similar subcentimeter low-attenuation lesions in the liver compatible with treated  metastasis.  No evidence for new metastatic disease in the chest, abdomen or pelvis. Cycle 29 5-FU/Avastin 12/26/2021 Cycle 30 5-FU/Avastin 01/16/2022 Cycle 41 5-FU/Avastin 02/06/2022 Cycle 42 5-FU/Avastin 02/27/2022 Cycle 43 5-FU/Avastin 03/20/2022 Cycle 44 5-FU/Avastin 04/10/2022 CT 04/28/2022-interval development of a 3 mm nodule within the left lower lobe and 3 mm nodule within the right lower lobe, nonspecific.  Unchanged subcentimeter low-attenuation  lesions in the liver compatible with treated metastasis. 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  She continues 5-FU/Avastin every 3 weeks.  She is tolerating well.  There is no clinical evidence of disease progression.  Plan to proceed with treatment today as scheduled.  CBC and chemistry panel reviewed.  Labs adequate for treatment as above.  She will return for follow-up in 3 weeks.  We are available to see her sooner if needed.    Ned Card ANP/GNP-BC   07/31/2022  9:41 AM

## 2022-07-31 NOTE — Progress Notes (Signed)
Patient seen by Lisa Thomas NP today  Vitals are within treatment parameters.  Labs reviewed by Lisa Thomas NP and are within treatment parameters.  Per physician team, patient is ready for treatment and there are NO modifications to the treatment plan.     

## 2022-07-31 NOTE — Progress Notes (Signed)
Ok to treat per Ned Card, NP without UP today.

## 2022-07-31 NOTE — Patient Instructions (Signed)
Kathy Robinson   Discharge Instructions: Thank you for choosing [facility] to provide your oncology and hematology care.   If you have a lab appointment with the [facility], please go directly to the [facility] and check in at the registration area.   Wear comfortable clothing and clothing appropriate for easy access to any Portacath or PICC line.   We strive to give you quality time with your provider. You may need to reschedule your appointment if you arrive late (15 or more minutes).  Arriving late affects you and other patients whose appointments are after yours.  Also, if you miss three or more appointments without notifying the office, you may be dismissed from the clinic at the provider's discretion.      For prescription refill requests, have your pharmacy contact our office and allow 72 hours for refills to be completed.    Today you received the following chemotherapy and/or immunotherapy agents Bevacizumab-bvzr (ZIRABEV) & Flourouracil (ADRUCIL).      To help prevent nausea and vomiting after your treatment, we encourage you to take your nausea medication as directed.  BELOW ARE SYMPTOMS THAT SHOULD BE REPORTED IMMEDIATELY: *FEVER GREATER THAN 100.4 F (38 C) OR HIGHER *CHILLS OR SWEATING *NAUSEA AND VOMITING THAT IS NOT CONTROLLED WITH YOUR NAUSEA MEDICATION *UNUSUAL SHORTNESS OF BREATH *UNUSUAL BRUISING OR BLEEDING *URINARY PROBLEMS (pain or burning when urinating, or frequent urination) *BOWEL PROBLEMS (unusual diarrhea, constipation, pain near the anus) TENDERNESS IN MOUTH AND THROAT WITH OR WITHOUT PRESENCE OF ULCERS (sore throat, sores in mouth, or a toothache) UNUSUAL RASH, SWELLING OR PAIN  UNUSUAL VAGINAL DISCHARGE OR ITCHING   Items with * indicate a potential emergency and should be followed up as soon as possible or go to the Emergency Department if any problems should occur.  Please show the CHEMOTHERAPY ALERT CARD or  IMMUNOTHERAPY ALERT CARD at check-in to the Emergency Department and triage nurse.  Should you have questions after your visit or need to cancel or reschedule your appointment, please contact [facility]  Dept: [phone]  and follow the prompts.  Office hours are 8:00 a.m. to 4:30 p.m. Monday - Friday. Please note that voicemails left after 4:00 p.m. may not be returned until the following business day.  We are closed weekends and major holidays. You have access to a nurse at all times for urgent questions. Please call the main number to the clinic Dept: [phone] and follow the prompts.   For any non-urgent questions, you may also contact your provider using MyChart. We now offer e-Visits for anyone 64 and older to request care online for non-urgent symptoms. For details visit [email or web address].   Also download the MyChart app! Go to the app store, search "MyChart", open the app, select [facility], and log in with your MyChart username and password.  Masks are optional in the cancer centers. If you would like for your care team to wear a mask while they are taking care of you, please let them know. You may have one support person who is at least 80 years old accompany you for your appointments.  Bevacizumab Injection What is this medication? BEVACIZUMAB (be va SIZ yoo mab) treats some types of cancer. It works by blocking a protein that causes cancer cells to grow and multiply. This helps to slow or stop the spread of cancer cells. It is a monoclonal antibody. This medicine may be used for other purposes; ask  your health care provider or pharmacist if you have questions. COMMON BRAND NAME(S): Alymsys, Avastin, MVASI, Kathy Robinson What should I tell my care team before I take this medication? They need to know if you have any of these conditions: Blood clots Coughing up blood Having or recent surgery Heart failure High blood pressure History of a connection  between 2 or more body parts that do not usually connect (fistula) History of a tear in your stomach or intestines Protein in your urine An unusual or allergic reaction to bevacizumab, other medications, foods, dyes, or preservatives Pregnant or trying to get pregnant Breast-feeding How should I use this medication? This medication is injected into a vein. It is given by your care team in a hospital or clinic setting. Talk to your care team the use of this medication in children. Special care may be needed. Overdosage: If you think you have taken too much of this medicine contact a poison control center or emergency room at once. NOTE: This medicine is only for you. Do not share this medicine with others. What if I miss a dose? Keep appointments for follow-up doses. It is important not to miss your dose. Call your care team if you are unable to keep an appointment. What may interact with this medication? Interactions are not expected. This list may not describe all possible interactions. Give your health care provider a list of all the medicines, herbs, non-prescription drugs, or dietary supplements you use. Also tell them if you smoke, drink alcohol, or use illegal drugs. Some items may interact with your medicine. What should I watch for while using this medication? Your condition will be monitored carefully while you are receiving this medication. You may need blood work while taking this medication. This medication may make you feel generally unwell. This is not uncommon as chemotherapy can affect healthy cells as well as cancer cells. Report any side effects. Continue your course of treatment even though you feel ill unless your care team tells you to stop. This medication may increase your risk to bruise or bleed. Call your care team if you notice any unusual bleeding. Before having surgery, talk to your care team to make sure it is ok. This medication can increase the risk of poor healing  of your surgical site or wound. You will need to stop this medication for 28 days before surgery. After surgery, wait at least 28 days before restarting this medication. Make sure the surgical site or wound is healed enough before restarting this medication. Talk to your care team if questions. Talk to your care team if you may be pregnant. Serious birth defects can occur if you take this medication during pregnancy and for 6 months after the last dose. Contraception is recommended while taking this medication and for 6 months after the last dose. Your care team can help you find the option that works for you. Do not breastfeed while taking this medication and for 6 months after the last dose. This medication can cause infertility. Talk to your care team if you are concerned about your fertility. What side effects may I notice from receiving this medication? Side effects that you should report to your care team as soon as possible: Allergic reactions--skin rash, itching, hives, swelling of the face, lips, tongue, or throat Bleeding--bloody or black, tar-like stools, vomiting blood or brown material that looks like coffee grounds, red or dark brown urine, small red or purple spots on skin, unusual bruising or bleeding Blood clot--pain, swelling,  or warmth in the leg, shortness of breath, chest pain Heart attack--pain or tightness in the chest, shoulders, arms, or jaw, nausea, shortness of breath, cold or clammy skin, feeling faint or lightheaded Heart failure--shortness of breath, swelling of the ankles, feet, or hands, sudden weight gain, unusual weakness or fatigue Increase in blood pressure Infection--fever, chills, cough, sore throat, wounds that don't heal, pain or trouble when passing urine, general feeling of discomfort or being unwell Infusion reactions--chest pain, shortness of breath or trouble breathing, feeling faint or lightheaded Kidney injury--decrease in the amount of urine, swelling of  the ankles, hands, or feet Stomach pain that is severe, does not go away, or gets worse Stroke--sudden numbness or weakness of the face, arm, or leg, trouble speaking, confusion, trouble walking, loss of balance or coordination, dizziness, severe headache, change in vision Sudden and severe headache, confusion, change in vision, seizures, which may be signs of posterior reversible encephalopathy syndrome (PRES) Side effects that usually do not require medical attention (report to your care team if they continue or are bothersome): Back pain Change in taste Diarrhea Dry skin Increased tears Nosebleed This list may not describe all possible side effects. Call your doctor for medical advice about side effects. You may report side effects to FDA at 1-800-FDA-1088. Where should I keep my medication? This medication is given in a hospital or clinic. It will not be stored at home. NOTE: This sheet is a summary. It may not cover all possible information. If you have questions about this medicine, talk to your doctor, pharmacist, or health care provider.  2023 Elsevier/Gold Standard (2022-03-20 00:00:00)  Fluorouracil Injection What is this medication? FLUOROURACIL (flure oh YOOR a sil) treats some types of cancer. It works by slowing down the growth of cancer cells. This medicine may be used for other purposes; ask your health care provider or pharmacist if you have questions. COMMON BRAND NAME(S): Adrucil What should I tell my care team before I take this medication? They need to know if you have any of these conditions: Blood disorders Dihydropyrimidine dehydrogenase (DPD) deficiency Infection, such as chickenpox, cold sores, herpes Kidney disease Liver disease Poor nutrition Recent or ongoing radiation therapy An unusual or allergic reaction to fluorouracil, other medications, foods, dyes, or preservatives If you or your partner are pregnant or trying to get pregnant Breast-feeding How  should I use this medication? This medication is injected into a vein. It is administered by your care team in a hospital or clinic setting. Talk to your care team about the use of this medication in children. Special care may be needed. Overdosage: If you think you have taken too much of this medicine contact a poison control center or emergency room at once. NOTE: This medicine is only for you. Do not share this medicine with others. What if I miss a dose? Keep appointments for follow-up doses. It is important not to miss your dose. Call your care team if you are unable to keep an appointment. What may interact with this medication? Do not take this medication with any of the following: Live virus vaccines This medication may also interact with the following: Medications that treat or prevent blood clots, such as warfarin, enoxaparin, dalteparin This list may not describe all possible interactions. Give your health care provider a list of all the medicines, herbs, non-prescription drugs, or dietary supplements you use. Also tell them if you smoke, drink alcohol, or use illegal drugs. Some items may interact with your medicine. What should  I watch for while using this medication? Your condition will be monitored carefully while you are receiving this medication. This medication may make you feel generally unwell. This is not uncommon as chemotherapy can affect healthy cells as well as cancer cells. Report any side effects. Continue your course of treatment even though you feel ill unless your care team tells you to stop. In some cases, you may be given additional medications to help with side effects. Follow all directions for their use. This medication may increase your risk of getting an infection. Call your care team for advice if you get a fever, chills, sore throat, or other symptoms of a cold or flu. Do not treat yourself. Try to avoid being around people who are sick. This medication may  increase your risk to bruise or bleed. Call your care team if you notice any unusual bleeding. Be careful brushing or flossing your teeth or using a toothpick because you may get an infection or bleed more easily. If you have any dental work done, tell your dentist you are receiving this medication. Avoid taking medications that contain aspirin, acetaminophen, ibuprofen, naproxen, or ketoprofen unless instructed by your care team. These medications may hide a fever. Do not treat diarrhea with over the counter products. Contact your care team if you have diarrhea that lasts more than 2 days or if it is severe and watery. This medication can make you more sensitive to the sun. Keep out of the sun. If you cannot avoid being in the sun, wear protective clothing and sunscreen. Do not use sun lamps, tanning beds, or tanning booths. Talk to your care team if you or your partner wish to become pregnant or think you might be pregnant. This medication can cause serious birth defects if taken during pregnancy and for 3 months after the last dose. A reliable form of contraception is recommended while taking this medication and for 3 months after the last dose. Talk to your care team about effective forms of contraception. Do not father a child while taking this medication and for 3 months after the last dose. Use a condom while having sex during this time period. Do not breastfeed while taking this medication. This medication may cause infertility. Talk to your care team if you are concerned about your fertility. What side effects may I notice from receiving this medication? Side effects that you should report to your care team as soon as possible: Allergic reactions--skin rash, itching, hives, swelling of the face, lips, tongue, or throat Heart attack--pain or tightness in the chest, shoulders, arms, or jaw, nausea, shortness of breath, cold or clammy skin, feeling faint or lightheaded Heart failure--shortness of  breath, swelling of the ankles, feet, or hands, sudden weight gain, unusual weakness or fatigue Heart rhythm changes--fast or irregular heartbeat, dizziness, feeling faint or lightheaded, chest pain, trouble breathing High ammonia level--unusual weakness or fatigue, confusion, loss of appetite, nausea, vomiting, seizures Infection--fever, chills, cough, sore throat, wounds that don't heal, pain or trouble when passing urine, general feeling of discomfort or being unwell Low red blood cell level--unusual weakness or fatigue, dizziness, headache, trouble breathing Pain, tingling, or numbness in the hands or feet, muscle weakness, change in vision, confusion or trouble speaking, loss of balance or coordination, trouble walking, seizures Redness, swelling, and blistering of the skin over hands and feet Severe or prolonged diarrhea Unusual bruising or bleeding Side effects that usually do not require medical attention (report to your care team if they continue or  are bothersome): Dry skin Headache Increased tears Nausea Pain, redness, or swelling with sores inside the mouth or throat Sensitivity to light Vomiting This list may not describe all possible side effects. Call your doctor for medical advice about side effects. You may report side effects to FDA at 1-800-FDA-1088. Where should I keep my medication? This medication is given in a hospital or clinic. It will not be stored at home. NOTE: This sheet is a summary. It may not cover all possible information. If you have questions about this medicine, talk to your doctor, pharmacist, or health care provider.  2023 Elsevier/Gold Standard (2022-03-13 00:00:00)  The chemotherapy medication bag should finish at 46 hours. For example, if your pump is scheduled for 46 hours and it was put on at 4:00 p.m., it should finish at 2:00 p.m. the day it is scheduled to come off regardless of your appointment time.     Estimated time to finish at 09:30 a.m.  on Thursday 08/02/2022.   If the display on your pump reads "Low Volume" and it is beeping, take the batteries out of the pump and come to the cancer center for it to be taken off.   If the pump alarms go off prior to the pump reading "Low Volume" then call [phone] and someone can assist you.  If the plunger comes out and the chemotherapy medication is leaking out, please use your home chemo spill kit to clean up the spill. Do NOT use paper towels or other household products.  If you have problems or questions regarding your pump, please call either [phone] (24 hours a day) or the cancer center Monday-Friday 8:00 a.m.- 4:30 p.m. at the clinic number and we will assist you. If you are unable to get assistance, then go to the nearest Emergency Department and ask the staff to contact the IV team for assistance.

## 2022-08-01 ENCOUNTER — Other Ambulatory Visit: Payer: Self-pay

## 2022-08-02 ENCOUNTER — Inpatient Hospital Stay: Payer: Medicare Other

## 2022-08-02 VITALS — BP 133/75 | HR 81 | Temp 97.7°F | Resp 18

## 2022-08-02 DIAGNOSIS — G629 Polyneuropathy, unspecified: Secondary | ICD-10-CM | POA: Diagnosis not present

## 2022-08-02 DIAGNOSIS — I4891 Unspecified atrial fibrillation: Secondary | ICD-10-CM | POA: Diagnosis not present

## 2022-08-02 DIAGNOSIS — C186 Malignant neoplasm of descending colon: Secondary | ICD-10-CM | POA: Diagnosis not present

## 2022-08-02 DIAGNOSIS — R197 Diarrhea, unspecified: Secondary | ICD-10-CM | POA: Diagnosis not present

## 2022-08-02 DIAGNOSIS — E876 Hypokalemia: Secondary | ICD-10-CM | POA: Diagnosis not present

## 2022-08-02 DIAGNOSIS — C185 Malignant neoplasm of splenic flexure: Secondary | ICD-10-CM

## 2022-08-02 DIAGNOSIS — Z5111 Encounter for antineoplastic chemotherapy: Secondary | ICD-10-CM | POA: Diagnosis not present

## 2022-08-02 MED ORDER — HEPARIN SOD (PORK) LOCK FLUSH 100 UNIT/ML IV SOLN
500.0000 [IU] | Freq: Once | INTRAVENOUS | Status: AC | PRN
  Administered 2022-08-02: 500 [IU]

## 2022-08-02 MED ORDER — SODIUM CHLORIDE 0.9% FLUSH
10.0000 mL | INTRAVENOUS | Status: DC | PRN
  Administered 2022-08-02: 10 mL

## 2022-08-06 ENCOUNTER — Other Ambulatory Visit: Payer: Self-pay | Admitting: Nurse Practitioner

## 2022-08-06 DIAGNOSIS — C185 Malignant neoplasm of splenic flexure: Secondary | ICD-10-CM

## 2022-08-07 ENCOUNTER — Other Ambulatory Visit (HOSPITAL_COMMUNITY): Payer: Self-pay

## 2022-08-07 MED ORDER — POTASSIUM CHLORIDE ER 10 MEQ PO CPCR
ORAL_CAPSULE | Freq: Two times a day (BID) | ORAL | 2 refills | Status: DC
  Filled 2022-08-07: qty 60, 30d supply, fill #0
  Filled 2022-09-17: qty 60, 30d supply, fill #1
  Filled 2022-10-15: qty 60, 30d supply, fill #2

## 2022-08-08 ENCOUNTER — Other Ambulatory Visit (HOSPITAL_COMMUNITY): Payer: Self-pay

## 2022-08-19 ENCOUNTER — Other Ambulatory Visit: Payer: Self-pay | Admitting: Oncology

## 2022-08-21 ENCOUNTER — Inpatient Hospital Stay: Payer: Medicare Other | Attending: Oncology | Admitting: Oncology

## 2022-08-21 ENCOUNTER — Other Ambulatory Visit (HOSPITAL_BASED_OUTPATIENT_CLINIC_OR_DEPARTMENT_OTHER): Payer: Self-pay

## 2022-08-21 ENCOUNTER — Inpatient Hospital Stay: Payer: Medicare Other

## 2022-08-21 VITALS — BP 142/70 | HR 79 | Temp 98.1°F | Resp 18 | Ht 66.0 in | Wt 156.0 lb

## 2022-08-21 VITALS — BP 159/72 | HR 64

## 2022-08-21 DIAGNOSIS — C185 Malignant neoplasm of splenic flexure: Secondary | ICD-10-CM

## 2022-08-21 DIAGNOSIS — R911 Solitary pulmonary nodule: Secondary | ICD-10-CM | POA: Diagnosis not present

## 2022-08-21 DIAGNOSIS — Z5111 Encounter for antineoplastic chemotherapy: Secondary | ICD-10-CM | POA: Diagnosis not present

## 2022-08-21 DIAGNOSIS — R197 Diarrhea, unspecified: Secondary | ICD-10-CM | POA: Insufficient documentation

## 2022-08-21 DIAGNOSIS — I2510 Atherosclerotic heart disease of native coronary artery without angina pectoris: Secondary | ICD-10-CM | POA: Insufficient documentation

## 2022-08-21 DIAGNOSIS — I4891 Unspecified atrial fibrillation: Secondary | ICD-10-CM | POA: Insufficient documentation

## 2022-08-21 DIAGNOSIS — D709 Neutropenia, unspecified: Secondary | ICD-10-CM | POA: Insufficient documentation

## 2022-08-21 DIAGNOSIS — G620 Drug-induced polyneuropathy: Secondary | ICD-10-CM | POA: Insufficient documentation

## 2022-08-21 DIAGNOSIS — C787 Secondary malignant neoplasm of liver and intrahepatic bile duct: Secondary | ICD-10-CM | POA: Insufficient documentation

## 2022-08-21 DIAGNOSIS — C186 Malignant neoplasm of descending colon: Secondary | ICD-10-CM | POA: Diagnosis not present

## 2022-08-21 DIAGNOSIS — T451X5A Adverse effect of antineoplastic and immunosuppressive drugs, initial encounter: Secondary | ICD-10-CM | POA: Diagnosis not present

## 2022-08-21 DIAGNOSIS — I700 Atherosclerosis of aorta: Secondary | ICD-10-CM | POA: Insufficient documentation

## 2022-08-21 LAB — CMP (CANCER CENTER ONLY)
ALT: 15 U/L (ref 0–44)
AST: 22 U/L (ref 15–41)
Albumin: 4 g/dL (ref 3.5–5.0)
Alkaline Phosphatase: 61 U/L (ref 38–126)
Anion gap: 10 (ref 5–15)
BUN: 27 mg/dL — ABNORMAL HIGH (ref 8–23)
CO2: 25 mmol/L (ref 22–32)
Calcium: 9.2 mg/dL (ref 8.9–10.3)
Chloride: 104 mmol/L (ref 98–111)
Creatinine: 0.97 mg/dL (ref 0.44–1.00)
GFR, Estimated: 59 mL/min — ABNORMAL LOW (ref >60)
Glucose, Bld: 79 mg/dL (ref 70–99)
Potassium: 4.3 mmol/L (ref 3.5–5.1)
Sodium: 139 mmol/L (ref 135–145)
Total Bilirubin: 0.4 mg/dL (ref 0.3–1.2)
Total Protein: 6.9 g/dL (ref 6.5–8.1)

## 2022-08-21 LAB — CBC WITH DIFFERENTIAL (CANCER CENTER ONLY)
Abs Immature Granulocytes: 0.01 10*3/uL (ref 0.00–0.07)
Basophils Absolute: 0 10*3/uL (ref 0.0–0.1)
Basophils Relative: 1 %
Eosinophils Absolute: 0.2 10*3/uL (ref 0.0–0.5)
Eosinophils Relative: 4 %
HCT: 38.7 % (ref 36.0–46.0)
Hemoglobin: 13 g/dL (ref 12.0–15.0)
Immature Granulocytes: 0 %
Lymphocytes Relative: 29 %
Lymphs Abs: 1.6 10*3/uL (ref 0.7–4.0)
MCH: 35.4 pg — ABNORMAL HIGH (ref 26.0–34.0)
MCHC: 33.6 g/dL (ref 30.0–36.0)
MCV: 105.4 fL — ABNORMAL HIGH (ref 80.0–100.0)
Monocytes Absolute: 0.4 10*3/uL (ref 0.1–1.0)
Monocytes Relative: 8 %
Neutro Abs: 3.2 10*3/uL (ref 1.7–7.7)
Neutrophils Relative %: 58 %
Platelet Count: 190 10*3/uL (ref 150–400)
RBC: 3.67 MIL/uL — ABNORMAL LOW (ref 3.87–5.11)
RDW: 13.2 % (ref 11.5–15.5)
WBC Count: 5.4 10*3/uL (ref 4.0–10.5)
nRBC: 0 % (ref 0.0–0.2)

## 2022-08-21 LAB — TOTAL PROTEIN, URINE DIPSTICK: Protein, ur: 30 mg/dL — AB

## 2022-08-21 MED ORDER — INFLUENZA VAC A&B SA ADJ QUAD 0.5 ML IM PRSY
PREFILLED_SYRINGE | INTRAMUSCULAR | 0 refills | Status: DC
  Filled 2022-08-21: qty 0.5, 1d supply, fill #0

## 2022-08-21 MED ORDER — SODIUM CHLORIDE 0.9 % IV SOLN
1600.0000 mg/m2 | INTRAVENOUS | Status: DC
  Administered 2022-08-21: 2900 mg via INTRAVENOUS
  Filled 2022-08-21: qty 50

## 2022-08-21 MED ORDER — SODIUM CHLORIDE 0.9 % IV SOLN
7.5000 mg/kg | Freq: Once | INTRAVENOUS | Status: AC
  Administered 2022-08-21: 500 mg via INTRAVENOUS
  Filled 2022-08-21: qty 16

## 2022-08-21 MED ORDER — SODIUM CHLORIDE 0.9 % IV SOLN: Freq: Once | INTRAVENOUS | Status: AC

## 2022-08-21 NOTE — Progress Notes (Signed)
Patient seen by Dr. Sherrill today ? ?Vitals are within treatment parameters. ? ?Labs reviewed by Dr. Sherrill and are within treatment parameters. ? ?Per physician team, patient is ready for treatment and there are NO modifications to the treatment plan.  ?

## 2022-08-21 NOTE — Patient Instructions (Addendum)
Iva  The chemotherapy medication bag should finish at 46 hours, 96 hours, or 7 days. For example, if your pump is scheduled for 46 hours and it was put on at 4:00 p.m., it should finish at 2:00 p.m. the day it is scheduled to come off regardless of your appointment time.     Estimated time to finish at 12:00 Thursday, August 23, 2022.   If the display on your pump reads "Low Volume" and it is beeping, take the batteries out of the pump and come to the cancer center for it to be taken off.   If the pump alarms go off prior to the pump reading "Low Volume" then call [phone] and someone can assist you.  If the plunger comes out and the chemotherapy medication is leaking out, please use your home chemo spill kit to clean up the spill. Do NOT use paper towels or other household products.  If you have problems or questions regarding your pump, please call either [phone] (24 hours a day) or the cancer center Monday-Friday 8:00 a.m.- 4:30 p.m. at the clinic number and we will assist you. If you are unable to get assistance, then go to the nearest Emergency Department and ask the staff to contact the IV team for assistance.   Discharge Instructions: Thank you for choosing [facility] to provide your oncology and hematology care.   If you have a lab appointment with the [facility], please go directly to the [facility] and check in at the registration area.   Wear comfortable clothing and clothing appropriate for easy access to any Portacath or PICC line.   We strive to give you quality time with your provider. You may need to reschedule your appointment if you arrive late (15 or more minutes).  Arriving late affects you and other patients whose appointments are after yours.  Also, if you miss three or more appointments without notifying the office, you may be dismissed from the clinic at the provider's discretion.      For prescription  refill requests, have your pharmacy contact our office and allow 72 hours for refills to be completed.    Today you received the following chemotherapy and/or immunotherapy agents Bevacizumab-bvzr, Fluorouracil.      To help prevent nausea and vomiting after your treatment, we encourage you to take your nausea medication as directed.  BELOW ARE SYMPTOMS THAT SHOULD BE REPORTED IMMEDIATELY: *FEVER GREATER THAN 100.4 F (38 C) OR HIGHER *CHILLS OR SWEATING *NAUSEA AND VOMITING THAT IS NOT CONTROLLED WITH YOUR NAUSEA MEDICATION *UNUSUAL SHORTNESS OF BREATH *UNUSUAL BRUISING OR BLEEDING *URINARY PROBLEMS (pain or burning when urinating, or frequent urination) *BOWEL PROBLEMS (unusual diarrhea, constipation, pain near the anus) TENDERNESS IN MOUTH AND THROAT WITH OR WITHOUT PRESENCE OF ULCERS (sore throat, sores in mouth, or a toothache) UNUSUAL RASH, SWELLING OR PAIN  UNUSUAL VAGINAL DISCHARGE OR ITCHING   Items with * indicate a potential emergency and should be followed up as soon as possible or go to the Emergency Department if any problems should occur.  Please show the CHEMOTHERAPY ALERT CARD or IMMUNOTHERAPY ALERT CARD at check-in to the Emergency Department and triage nurse.  Should you have questions after your visit or need to cancel or reschedule your appointment, please contact [facility]  Dept: [phone]  and follow the prompts.  Office hours are 8:00 a.m. to 4:30 p.m. Monday - Friday. Please note that voicemails left  after 4:00 p.m. may not be returned until the following business day.  We are closed weekends and major holidays. You have access to a nurse at all times for urgent questions. Please call the main number to the clinic Dept: [phone] and follow the prompts.   For any non-urgent questions, you may also contact your provider using MyChart. We now offer e-Visits for anyone 80 and older to request care online for non-urgent symptoms. For  details visit [email or web address].   Also download the MyChart app! Go to the app store, search "MyChart", open the app, select [facility], and log in with your MyChart username and password.  Masks are optional in the cancer centers. If you would like for your care team to wear a mask while they are taking care of you, please let them know. You may have one support person who is at least 80 years old accompany you for your appointments.

## 2022-08-21 NOTE — Progress Notes (Signed)
[place] OFFICE PROGRESS NOTE   Diagnosis: Colon cancer  INTERVAL HISTORY:   Kathy Robinson complete another cycle of 5-FU/bevacizumab on 07/31/2022.  No nausea/vomiting, mouth sores, or diarrhea.  No bleeding or symptom of thrombosis.  She has persistent peripheral neuropathy symptoms.  No other complaint.  Objective:  Vital signs in last 24 hours:  Blood pressure (!) 142/70, pulse 79, temperature 98.1 F (36.7 C), temperature source Oral, resp. rate 18, height $RemoveBe'5\' 6"'rOzUiOHqP$  (1.676 m), weight 156 lb (70.8 kg), last menstrual period 11/19/1996, SpO2 100 %.    HEENT: No thrush or ulcers Resp: Lungs clear bilaterally Cardio: Regular rate and rhythm GI: No hepatosplenomegaly Vascular: No leg edema    Portacath/PICC-without erythema  Lab Results:  Lab Results  Component Value Date   WBC 5.4 08/21/2022   HGB 13.0 08/21/2022   HCT 38.7 08/21/2022   MCV 105.4 (H) 08/21/2022   PLT 190 08/21/2022   NEUTROABS 3.2 08/21/2022    CMP  Lab Results  Component Value Date   NA 140 07/31/2022   K 4.3 07/31/2022   CL 105 07/31/2022   CO2 27 07/31/2022   GLUCOSE 110 (H) 07/31/2022   BUN 22 07/31/2022   CREATININE 0.94 07/31/2022   CALCIUM 9.3 07/31/2022   PROT 6.5 07/31/2022   ALBUMIN 3.9 07/31/2022   AST 27 07/31/2022   ALT 21 07/31/2022   ALKPHOS 55 07/31/2022   BILITOT 0.4 07/31/2022   GFRNONAA >60 07/31/2022   GFRAA >60 08/16/2020    Lab Results  Component Value Date   CEA1 1.16 03/29/2021   CEA 1.65 07/31/2022   Medications: I have reviewed the patient's current medications.   Assessment/Plan: Adenocarcinoma the left colon, stage IIIc (pT4b,pN2a), status post a left colectomy 05/30/2020, MSS, TMB 13, BRAF V600E Tumor invades the visceral peritoneum, lymphovascular and perineural invasion present, for tumor deposits, 7/13 lymph nodes, negative resection margins, no loss of mismatch repair protein expression CT abdomen/pelvis 05/26/2020-long segment of  masslike thickening of the descending colon with associated high-grade colonic obstruction, small colonic wall defect with evidence of a focally contained microperforation, retroperitoneal adenopathy, indeterminate small hypodense liver lesions Elevated preoperative CEA PET 06/22/2020-hypermetabolic liver metastases, periportal and periaortic adenopathy, hypermetabolic mediastinal and left supraclavicular nodes Cycle 1 FOLFOX 07/05/20 Cycle 2 FOLFOXIRI (dose-reduced 5FU, no bolus) and Avastin 07/19/20  Cycle 3 FOLFOXIRI (Dose reduced 5-FU, no bolus)/Avastin 08/02/2020 Cycle 4 FOLFOXIRI/Avastin 08/16/2020 (Udenyca added) Cycle 5 FOLFOXIRI/Avastin 08/30/2020 CTs 09/09/2020-diminished size of lymph nodes in the chest, abdomen, and pelvis.  Decreased hepatic metastases.  No new evidence of disease progression, fat density lesion surrounding the left hemicolectomy Cycle 6 FOLFOXIRI/Avastin 09/13/2020 Cycle 7 FOLFOX/Avastin 09/27/2020 (irinotecan held) Cycle 8 FOLFOXIRI/Avastin 10/18/2020  Cycle 9 FOLFOXIRI/Avastin 11/01/2020 Cycle 10 FOLFOXIRI/Avastin 11/22/2020 (oxaliplatin held, irinotecan and 5-FU dose reduced) Cycle 11 FOLFOXIRI/Avastin 12/12/2020 (oxaliplatin held) CTs 12/19/2020-decrease in size of liver metastases, no evidence of disease progression Cycle 12 FOLFOXIRI/Avastin 01/04/2021 (oxaliplatin held) Cycle 13 FOLFOXIRI/Avastin 01/24/2021 (oxaliplatin held) Cycle 14 FOLFOXIRI/Avastin 02/14/2021 (oxaliplatin held) Cycle 15 FOLFOXIRI/Avastin 03/07/2021 (oxaliplatin held) Cycle 16 FOLFOXIRI/Avastin 03/29/2021 (oxaliplatin held) CTs 04/14/2021- decreased size of liver metastases.  No new or progressive findings. Cycle 17 FOLFOXIRI/Avastin 04/19/2021 (oxaliplatin held) Cycle 18 FOLFOXIRI/Avastin 05/09/2021 (oxaliplatin held) Cycle 19 FOLFOXIRI/Avastin 05/30/2021 (oxaliplatin held) Cycle 20 FOLFOXIRI/Avastin 06/20/2021 (oxaliplatin held) Cycle 21 FOLFOXIRI/Avastin 07/11/2021 (oxaliplatin held) CTs  07/28/2021-unchanged subcentimeter liver lesions, no evidence of new metastatic disease Cycle 22 5-FU/ Avastin 08/01/2021 Cycle 23 5-FU/Avastin 08/22/2021 Cycle 24 5-FU/Avastin 09/12/2021 Cycle 25 5-FU/Avastin 10/03/2021 Cycle  26 5-FU/Avastin 10/24/2021 Cycle 27 5-FU/Avastin 11/15/2021 Cycle 28 5-FU/Avastin 12/05/2021 CTs 12/22/2021-grossly similar subcentimeter low-attenuation lesions in the liver compatible with treated metastasis.  No evidence for new metastatic disease in the chest, abdomen or pelvis. Cycle 29 5-FU/Avastin 12/26/2021 Cycle 30 5-FU/Avastin 01/16/2022 Cycle 41 5-FU/Avastin 02/06/2022 Cycle 42 5-FU/Avastin 02/27/2022 Cycle 43 5-FU/Avastin 03/20/2022 Cycle 44 5-FU/Avastin 04/10/2022 CT 04/28/2022-interval development of a 3 mm nodule within the left lower lobe and 3 mm nodule within the right lower lobe, nonspecific.  Unchanged subcentimeter low-attenuation lesions in the liver compatible with treated metastasis. Cycle 45 5-FU/Avastin 05/01/2022 Cycle 46 5-FU/Avastin 05/29/2022 Cycle 47 5-FU/Avastin 06/19/2022 Cycle 48 5-FU/Avastin 07/10/2022 Cycle 49 5-FU/Avastin 07/31/2022 Cycle 50 5-FU/Avastin 08/21/2022     Atrial fibrillation with rapid ventricular response 05/26/2020 Mild neutropenia following cycle 3 FOLFOXIRI, Udenyca added with cycle 4 Hypokalemia secondary to diarrhea-potassium supplementation starting 09/13/2020 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  She continues to tolerate the 5-FU/bevacizumab well.  She will complete another cycle today.  The CEA was normal on 07/31/2022.  Kathy Robinson will undergo a restaging CT evaluation after this cycle.  She will return for an office visit in 3 weeks.  [name], MD  08/21/2022  11:49 AM

## 2022-08-22 ENCOUNTER — Other Ambulatory Visit: Payer: Self-pay

## 2022-08-23 ENCOUNTER — Inpatient Hospital Stay: Payer: Medicare Other

## 2022-08-23 VITALS — BP 142/66 | HR 81 | Temp 98.2°F | Resp 18

## 2022-08-23 DIAGNOSIS — I700 Atherosclerosis of aorta: Secondary | ICD-10-CM | POA: Diagnosis not present

## 2022-08-23 DIAGNOSIS — Z5111 Encounter for antineoplastic chemotherapy: Secondary | ICD-10-CM | POA: Diagnosis not present

## 2022-08-23 DIAGNOSIS — C186 Malignant neoplasm of descending colon: Secondary | ICD-10-CM | POA: Diagnosis not present

## 2022-08-23 DIAGNOSIS — R197 Diarrhea, unspecified: Secondary | ICD-10-CM | POA: Diagnosis not present

## 2022-08-23 DIAGNOSIS — T451X5A Adverse effect of antineoplastic and immunosuppressive drugs, initial encounter: Secondary | ICD-10-CM | POA: Diagnosis not present

## 2022-08-23 DIAGNOSIS — G620 Drug-induced polyneuropathy: Secondary | ICD-10-CM | POA: Diagnosis not present

## 2022-08-23 DIAGNOSIS — C185 Malignant neoplasm of splenic flexure: Secondary | ICD-10-CM

## 2022-08-23 DIAGNOSIS — R911 Solitary pulmonary nodule: Secondary | ICD-10-CM | POA: Diagnosis not present

## 2022-08-23 DIAGNOSIS — D709 Neutropenia, unspecified: Secondary | ICD-10-CM | POA: Diagnosis not present

## 2022-08-23 DIAGNOSIS — C787 Secondary malignant neoplasm of liver and intrahepatic bile duct: Secondary | ICD-10-CM | POA: Diagnosis not present

## 2022-08-23 DIAGNOSIS — I4891 Unspecified atrial fibrillation: Secondary | ICD-10-CM | POA: Diagnosis not present

## 2022-08-23 DIAGNOSIS — I2510 Atherosclerotic heart disease of native coronary artery without angina pectoris: Secondary | ICD-10-CM | POA: Diagnosis not present

## 2022-08-23 MED ORDER — HEPARIN SOD (PORK) LOCK FLUSH 100 UNIT/ML IV SOLN
500.0000 [IU] | Freq: Once | INTRAVENOUS | Status: AC | PRN
  Administered 2022-08-23: 500 [IU]

## 2022-08-23 MED ORDER — SODIUM CHLORIDE 0.9% FLUSH
10.0000 mL | INTRAVENOUS | Status: DC | PRN
  Administered 2022-08-23: 10 mL

## 2022-08-23 NOTE — Patient Instructions (Signed)

## 2022-09-07 ENCOUNTER — Ambulatory Visit (HOSPITAL_BASED_OUTPATIENT_CLINIC_OR_DEPARTMENT_OTHER)
Admission: RE | Admit: 2022-09-07 | Discharge: 2022-09-07 | Disposition: A | Discharge status: Home / Self Care | Payer: Medicare Other | Source: Ambulatory Visit | Attending: Oncology | Admitting: Oncology

## 2022-09-07 DIAGNOSIS — R918 Other nonspecific abnormal finding of lung field: Secondary | ICD-10-CM | POA: Diagnosis not present

## 2022-09-07 DIAGNOSIS — C185 Malignant neoplasm of splenic flexure: Secondary | ICD-10-CM | POA: Insufficient documentation

## 2022-09-07 DIAGNOSIS — C785 Secondary malignant neoplasm of large intestine and rectum: Secondary | ICD-10-CM | POA: Diagnosis not present

## 2022-09-07 MED ORDER — IOHEXOL 300 MG/ML  SOLN
100.0000 mL | Freq: Once | INTRAMUSCULAR | Status: AC | PRN
  Administered 2022-09-07: 100 mL via INTRAVENOUS

## 2022-09-09 ENCOUNTER — Other Ambulatory Visit: Payer: Self-pay | Admitting: Oncology

## 2022-09-09 DIAGNOSIS — C185 Malignant neoplasm of splenic flexure: Secondary | ICD-10-CM

## 2022-09-11 ENCOUNTER — Inpatient Hospital Stay: Payer: Medicare Other

## 2022-09-11 ENCOUNTER — Encounter: Payer: Self-pay | Admitting: *Deleted

## 2022-09-11 ENCOUNTER — Inpatient Hospital Stay (HOSPITAL_BASED_OUTPATIENT_CLINIC_OR_DEPARTMENT_OTHER): Payer: Medicare Other | Admitting: Oncology

## 2022-09-11 VITALS — BP 150/83 | HR 63 | Temp 98.2°F | Resp 20 | Ht 66.0 in | Wt 155.6 lb

## 2022-09-11 VITALS — BP 151/80 | HR 60 | Resp 20

## 2022-09-11 DIAGNOSIS — C185 Malignant neoplasm of splenic flexure: Secondary | ICD-10-CM

## 2022-09-11 DIAGNOSIS — R197 Diarrhea, unspecified: Secondary | ICD-10-CM | POA: Diagnosis not present

## 2022-09-11 DIAGNOSIS — C787 Secondary malignant neoplasm of liver and intrahepatic bile duct: Secondary | ICD-10-CM | POA: Diagnosis not present

## 2022-09-11 DIAGNOSIS — Z5111 Encounter for antineoplastic chemotherapy: Secondary | ICD-10-CM | POA: Diagnosis not present

## 2022-09-11 DIAGNOSIS — C186 Malignant neoplasm of descending colon: Secondary | ICD-10-CM | POA: Diagnosis not present

## 2022-09-11 DIAGNOSIS — I700 Atherosclerosis of aorta: Secondary | ICD-10-CM | POA: Diagnosis not present

## 2022-09-11 DIAGNOSIS — I2510 Atherosclerotic heart disease of native coronary artery without angina pectoris: Secondary | ICD-10-CM | POA: Diagnosis not present

## 2022-09-11 DIAGNOSIS — G620 Drug-induced polyneuropathy: Secondary | ICD-10-CM | POA: Diagnosis not present

## 2022-09-11 DIAGNOSIS — D709 Neutropenia, unspecified: Secondary | ICD-10-CM | POA: Diagnosis not present

## 2022-09-11 DIAGNOSIS — R911 Solitary pulmonary nodule: Secondary | ICD-10-CM | POA: Diagnosis not present

## 2022-09-11 DIAGNOSIS — I4891 Unspecified atrial fibrillation: Secondary | ICD-10-CM | POA: Diagnosis not present

## 2022-09-11 DIAGNOSIS — T451X5A Adverse effect of antineoplastic and immunosuppressive drugs, initial encounter: Secondary | ICD-10-CM | POA: Diagnosis not present

## 2022-09-11 LAB — CMP (CANCER CENTER ONLY)
ALT: 16 U/L (ref 0–44)
AST: 25 U/L (ref 15–41)
Albumin: 4.1 g/dL (ref 3.5–5.0)
Alkaline Phosphatase: 51 U/L (ref 38–126)
Anion gap: 7 (ref 5–15)
BUN: 22 mg/dL (ref 8–23)
CO2: 28 mmol/L (ref 22–32)
Calcium: 9.9 mg/dL (ref 8.9–10.3)
Chloride: 104 mmol/L (ref 98–111)
Creatinine: 0.98 mg/dL (ref 0.44–1.00)
GFR, Estimated: 58 mL/min — ABNORMAL LOW (ref >60)
Glucose, Bld: 80 mg/dL (ref 70–99)
Potassium: 4.6 mmol/L (ref 3.5–5.1)
Sodium: 139 mmol/L (ref 135–145)
Total Bilirubin: 0.5 mg/dL (ref 0.3–1.2)
Total Protein: 6.5 g/dL (ref 6.5–8.1)

## 2022-09-11 LAB — CBC WITH DIFFERENTIAL (CANCER CENTER ONLY)
Abs Immature Granulocytes: 0 10*3/uL (ref 0.00–0.07)
Basophils Absolute: 0 10*3/uL (ref 0.0–0.1)
Basophils Relative: 1 %
Eosinophils Absolute: 0.2 10*3/uL (ref 0.0–0.5)
Eosinophils Relative: 6 %
HCT: 40.3 % (ref 36.0–46.0)
Hemoglobin: 13.6 g/dL (ref 12.0–15.0)
Immature Granulocytes: 0 %
Lymphocytes Relative: 38 %
Lymphs Abs: 1.5 10*3/uL (ref 0.7–4.0)
MCH: 35.7 pg — ABNORMAL HIGH (ref 26.0–34.0)
MCHC: 33.7 g/dL (ref 30.0–36.0)
MCV: 105.8 fL — ABNORMAL HIGH (ref 80.0–100.0)
Monocytes Absolute: 0.4 10*3/uL (ref 0.1–1.0)
Monocytes Relative: 9 %
Neutro Abs: 1.8 10*3/uL (ref 1.7–7.7)
Neutrophils Relative %: 46 %
Platelet Count: 203 10*3/uL (ref 150–400)
RBC: 3.81 MIL/uL — ABNORMAL LOW (ref 3.87–5.11)
RDW: 13.5 % (ref 11.5–15.5)
WBC Count: 3.9 10*3/uL — ABNORMAL LOW (ref 4.0–10.5)
nRBC: 0 % (ref 0.0–0.2)

## 2022-09-11 LAB — TOTAL PROTEIN, URINE DIPSTICK

## 2022-09-11 LAB — CEA (ACCESS): CEA (CHCC): 2.1 ng/mL (ref 0.00–5.00)

## 2022-09-11 MED ORDER — SODIUM CHLORIDE 0.9 % IV SOLN
7.5000 mg/kg | Freq: Once | INTRAVENOUS | Status: AC
  Administered 2022-09-11: 500 mg via INTRAVENOUS
  Filled 2022-09-11: qty 16

## 2022-09-11 MED ORDER — SODIUM CHLORIDE 0.9 % IV SOLN: Freq: Once | INTRAVENOUS | Status: AC

## 2022-09-11 MED ORDER — SODIUM CHLORIDE 0.9 % IV SOLN
1600.0000 mg/m2 | INTRAVENOUS | Status: DC
  Administered 2022-09-11: 2900 mg via INTRAVENOUS
  Filled 2022-09-11: qty 58

## 2022-09-11 NOTE — Progress Notes (Signed)
Patient seen by Dr. Sherrill today ? ?Vitals are within treatment parameters. ? ?Labs reviewed by Dr. Sherrill and are within treatment parameters. ? ?Per physician team, patient is ready for treatment and there are NO modifications to the treatment plan.  ?

## 2022-09-11 NOTE — Progress Notes (Signed)
[place] OFFICE PROGRESS NOTE   Diagnosis: Colon cancer  INTERVAL HISTORY:   Ms. Patient pleated another cycle of 5-FU/bevacizumab on 08/21/2022.  No mouth sores, diarrhea, bleeding, or symptom of thrombosis.  She feels well.  No new complaint.  Stable neuropathy symptoms.  Objective:  Vital signs in last 24 hours:  Blood pressure (!) 150/83, pulse 63, temperature 98.2 F (36.8 C), temperature source Oral, resp. rate 20, height $RemoveBe'5\' 6"'noaHpWAdq$  (1.676 m), weight 155 lb 9.6 oz (70.6 kg), last menstrual period 11/19/1996, SpO2 98 %.    HEENT: No thrush or ulcer at the right side of the tongue (she reports biting her tongue) Resp: Lungs clear bilaterally Cardio: Regular rate and rhythm GI: No hepatosplenomegaly Vascular: No leg edema    Portacath/PICC-without erythema  Lab Results:  Lab Results  Component Value Date   WBC 3.9 (L) 09/11/2022   HGB 13.6 09/11/2022   HCT 40.3 09/11/2022   MCV 105.8 (H) 09/11/2022   PLT 203 09/11/2022   NEUTROABS 1.8 09/11/2022    CMP  Lab Results  Component Value Date   NA 139 08/21/2022   K 4.3 08/21/2022   CL 104 08/21/2022   CO2 25 08/21/2022   GLUCOSE 79 08/21/2022   BUN 27 (H) 08/21/2022   CREATININE 0.97 08/21/2022   CALCIUM 9.2 08/21/2022   PROT 6.9 08/21/2022   ALBUMIN 4.0 08/21/2022   AST 22 08/21/2022   ALT 15 08/21/2022   ALKPHOS 61 08/21/2022   BILITOT 0.4 08/21/2022   GFRNONAA 59 (L) 08/21/2022   GFRAA >60 08/16/2020    Lab Results  Component Value Date   CEA1 1.16 03/29/2021   CEA 1.65 07/31/2022    Imaging:  CT CHEST ABDOMEN PELVIS W CONTRAST  Result Date: 09/09/2022 CLINICAL DATA:  Metastatic colon cancer restaging * Tracking Code: BO * EXAM: CT CHEST, ABDOMEN, AND PELVIS WITH CONTRAST TECHNIQUE: Multidetector CT imaging of the chest, abdomen and pelvis was performed following the standard protocol during bolus administration of intravenous contrast. RADIATION DOSE REDUCTION: This exam was  performed according to the departmental dose-optimization program which includes automated exposure control, adjustment of the mA and/or kV according to patient size and/or use of iterative reconstruction technique. CONTRAST:  148mL OMNIPAQUE IOHEXOL 300 MG/ML SOLN, additional oral enteric contrast COMPARISON:  04/27/2022, 09/09/2020 FINDINGS: CT CHEST FINDINGS Cardiovascular: Right chest port catheter. Aortic atherosclerosis. Normal heart size. Left coronary artery calcifications. No pericardial effusion. Mediastinum/Nodes: No enlarged mediastinal, hilar, or axillary lymph nodes. Thyroid gland, trachea, and esophagus demonstrate no significant findings. Lungs/Pleura: Tiny nodules in the bilateral lung bases described on prior examination are resolved. Unchanged 0.2 cm nodule of the dependent right lower lobe stable since at least 09/09/2020 and presumed benign (series 4, image 74). Minimal bandlike scarring of the medial segment right middle lobe and lingula. No pleural effusion or pneumothorax. Musculoskeletal: No chest wall abnormality. No acute osseous findings. CT ABDOMEN PELVIS FINDINGS Hepatobiliary: No solid liver abnormality is seen. Multiple treated hepatic metastases are not appreciated on today's examination. No gallstones, gallbladder wall thickening, or biliary dilatation. Pancreas: Unremarkable. No pancreatic ductal dilatation or surrounding inflammatory changes. Spleen: Normal in size without significant abnormality. Adrenals/Urinary Tract: Adrenal glands are unremarkable. Kidneys are normal, without renal calculi, solid lesion, or hydronephrosis. Bladder is unremarkable. Stomach/Bowel: Stomach is within normal limits. Appendix appears normal. No evidence of bowel wall thickening, distention, or inflammatory changes. Sigmoid colon resection and reanastomosis. Vascular/Lymphatic: Aortic atherosclerosis. No enlarged abdominal or pelvic lymph nodes. Reproductive: No mass  or other abnormality. Other: No  abdominal wall hernia or abnormality. No ascites. Musculoskeletal: No acute osseous findings. IMPRESSION: 1. Status post sigmoid colon resection and reanastomosis. 2. No evidence of persistent lymphadenopathy or metastatic disease in the chest, abdomen, or pelvis. 3. Tiny nodules in the bilateral lung bases described as new on prior examination are resolved, consistent with nonspecific resolved infection or inflammation. 4. Multiple treated hepatic metastases are not appreciated on today's examination. 5. Coronary artery disease. Aortic Atherosclerosis (ICD10-I70.0). Electronically Signed   By: Delanna Ahmadi M.D.   On: 09/09/2022 08:35    Medications: I have reviewed the patient's current medications.   Assessment/Plan:  Adenocarcinoma the left colon, stage IIIc (pT4b,pN2a), status post a left colectomy 05/30/2020, MSS, TMB 13, BRAF V600E Tumor invades the visceral peritoneum, lymphovascular and perineural invasion present, for tumor deposits, 7/13 lymph nodes, negative resection margins, no loss of mismatch repair protein expression CT abdomen/pelvis 05/26/2020-long segment of masslike thickening of the descending colon with associated high-grade colonic obstruction, small colonic wall defect with evidence of a focally contained microperforation, retroperitoneal adenopathy, indeterminate small hypodense liver lesions Elevated preoperative CEA PET 06/22/2020-hypermetabolic liver metastases, periportal and periaortic adenopathy, hypermetabolic mediastinal and left supraclavicular nodes Cycle 1 FOLFOX 07/05/20 Cycle 2 FOLFOXIRI (dose-reduced 5FU, no bolus) and Avastin 07/19/20  Cycle 3 FOLFOXIRI (Dose reduced 5-FU, no bolus)/Avastin 08/02/2020 Cycle 4 FOLFOXIRI/Avastin 08/16/2020 (Udenyca added) Cycle 5 FOLFOXIRI/Avastin 08/30/2020 CTs 09/09/2020-diminished size of lymph nodes in the chest, abdomen, and pelvis.  Decreased hepatic metastases.  No new evidence of disease progression, fat density lesion  surrounding the left hemicolectomy Cycle 6 FOLFOXIRI/Avastin 09/13/2020 Cycle 7 FOLFOX/Avastin 09/27/2020 (irinotecan held) Cycle 8 FOLFOXIRI/Avastin 10/18/2020  Cycle 9 FOLFOXIRI/Avastin 11/01/2020 Cycle 10 FOLFOXIRI/Avastin 11/22/2020 (oxaliplatin held, irinotecan and 5-FU dose reduced) Cycle 11 FOLFOXIRI/Avastin 12/12/2020 (oxaliplatin held) CTs 12/19/2020-decrease in size of liver metastases, no evidence of disease progression Cycle 12 FOLFOXIRI/Avastin 01/04/2021 (oxaliplatin held) Cycle 13 FOLFOXIRI/Avastin 01/24/2021 (oxaliplatin held) Cycle 14 FOLFOXIRI/Avastin 02/14/2021 (oxaliplatin held) Cycle 15 FOLFOXIRI/Avastin 03/07/2021 (oxaliplatin held) Cycle 16 FOLFOXIRI/Avastin 03/29/2021 (oxaliplatin held) CTs 04/14/2021- decreased size of liver metastases.  No new or progressive findings. Cycle 17 FOLFOXIRI/Avastin 04/19/2021 (oxaliplatin held) Cycle 18 FOLFOXIRI/Avastin 05/09/2021 (oxaliplatin held) Cycle 19 FOLFOXIRI/Avastin 05/30/2021 (oxaliplatin held) Cycle 20 FOLFOXIRI/Avastin 06/20/2021 (oxaliplatin held) Cycle 21 FOLFOXIRI/Avastin 07/11/2021 (oxaliplatin held) CTs 07/28/2021-unchanged subcentimeter liver lesions, no evidence of new metastatic disease Cycle 22 5-FU/ Avastin 08/01/2021 Cycle 23 5-FU/Avastin 08/22/2021 Cycle 24 5-FU/Avastin 09/12/2021 Cycle 25 5-FU/Avastin 10/03/2021 Cycle 26 5-FU/Avastin 10/24/2021 Cycle 27 5-FU/Avastin 11/15/2021 Cycle 28 5-FU/Avastin 12/05/2021 CTs 12/22/2021-grossly similar subcentimeter low-attenuation lesions in the liver compatible with treated metastasis.  No evidence for new metastatic disease in the chest, abdomen or pelvis. Cycle 29 5-FU/Avastin 12/26/2021 Cycle 30 5-FU/Avastin 01/16/2022 Cycle 41 5-FU/Avastin 02/06/2022 Cycle 42 5-FU/Avastin 02/27/2022 Cycle 43 5-FU/Avastin 03/20/2022 Cycle 44 5-FU/Avastin 04/10/2022 CT 04/28/2022-interval development of a 3 mm nodule within the left lower lobe and 3 mm nodule within the right lower lobe, nonspecific.  Unchanged  subcentimeter low-attenuation lesions in the liver compatible with treated metastasis. Cycle 45 5-FU/Avastin 05/01/2022 Cycle 46 5-FU/Avastin 05/29/2022 Cycle 47 5-FU/Avastin 06/19/2022 Cycle 48 5-FU/Avastin 07/10/2022 Cycle 49 5-FU/Avastin 07/31/2022 Cycle 50 5-FU/Avastin 08/21/2022 CTs 09/07/2022-no evidence of metastatic disease, tiny lung nodules described as new on previous exam have resolved, no hepatic metastases are appreciated Cycle 51 5-FU/Avastin 09/11/2022     Atrial fibrillation with rapid ventricular response 05/26/2020 Mild neutropenia following cycle 3 FOLFOXIRI, Udenyca added with cycle 4 Hypokalemia secondary to diarrhea-potassium supplementation starting 09/13/2020 Oxaliplatin neuropathy-moderate  loss of vibratory sense on exam 11/22/2020, oxaliplatin held with cycle 10, cycle 11, 12, 13 chemotherapy, improved       Disposition: Ms. Didio appears stable.  She is in clinical and radiologic remission from colon cancer.  She is tolerating the 5-FU/Avastin well.  I recommend continuing the current treatment.  She agrees.  She will complete another cycle today. Ms. Georgiades will return for an office visit and chemotherapy in 3 weeks.  [name], MD  09/11/2022  11:07 AM

## 2022-09-11 NOTE — Patient Instructions (Signed)
Kathy Robinson   Discharge Instructions: Thank you for choosing [facility] to provide your oncology and hematology care.   If you have a lab appointment with the [facility], please go directly to the [facility] and check in at the registration area.   Wear comfortable clothing and clothing appropriate for easy access to any Portacath or PICC line.   We strive to give you quality time with your provider. You may need to reschedule your appointment if you arrive late (15 or more minutes).  Arriving late affects you and other patients whose appointments are after yours.  Also, if you miss three or more appointments without notifying the office, you may be dismissed from the clinic at the provider's discretion.      For prescription refill requests, have your pharmacy contact our office and allow 72 hours for refills to be completed.    Today you received the following chemotherapy and/or immunotherapy agents Bevacizumab-bvzr (ZIRABEV) & Flourouracil (ADRUCIL).      To help prevent nausea and vomiting after your treatment, we encourage you to take your nausea medication as directed.  BELOW ARE SYMPTOMS THAT SHOULD BE REPORTED IMMEDIATELY: *FEVER GREATER THAN 100.4 F (38 C) OR HIGHER *CHILLS OR SWEATING *NAUSEA AND VOMITING THAT IS NOT CONTROLLED WITH YOUR NAUSEA MEDICATION *UNUSUAL SHORTNESS OF BREATH *UNUSUAL BRUISING OR BLEEDING *URINARY PROBLEMS (pain or burning when urinating, or frequent urination) *BOWEL PROBLEMS (unusual diarrhea, constipation, pain near the anus) TENDERNESS IN MOUTH AND THROAT WITH OR WITHOUT PRESENCE OF ULCERS (sore throat, sores in mouth, or a toothache) UNUSUAL RASH, SWELLING OR PAIN  UNUSUAL VAGINAL DISCHARGE OR ITCHING   Items with * indicate a potential emergency and should be followed up as soon as possible or go to the Emergency Department if any problems should occur.  Please show the CHEMOTHERAPY ALERT CARD or  IMMUNOTHERAPY ALERT CARD at check-in to the Emergency Department and triage nurse.  Should you have questions after your visit or need to cancel or reschedule your appointment, please contact [facility]  Dept: [phone]  and follow the prompts.  Office hours are 8:00 a.m. to 4:30 p.m. Monday - Friday. Please note that voicemails left after 4:00 p.m. may not be returned until the following business day.  We are closed weekends and major holidays. You have access to a nurse at all times for urgent questions. Please call the main number to the clinic Dept: [phone] and follow the prompts.   For any non-urgent questions, you may also contact your provider using MyChart. We now offer e-Visits for anyone 64 and older to request care online for non-urgent symptoms. For details visit [email or web address].   Also download the MyChart app! Go to the app store, search "MyChart", open the app, select [facility], and log in with your MyChart username and password.  Masks are optional in the cancer centers. If you would like for your care team to wear a mask while they are taking care of you, please let them know. You may have one support person who is at least 80 years old accompany you for your appointments.  Bevacizumab Injection What is this medication? BEVACIZUMAB (be va SIZ yoo mab) treats some types of cancer. It works by blocking a protein that causes cancer cells to grow and multiply. This helps to slow or stop the spread of cancer cells. It is a monoclonal antibody. This medicine may be used for other purposes; ask  your health care provider or pharmacist if you have questions. COMMON BRAND NAME(S): Alymsys, Avastin, MVASI, [name] What should I tell my care team before I take this medication? They need to know if you have any of these conditions: Blood clots Coughing up blood Having or recent surgery Heart failure High blood pressure History of a connection  between 2 or more body parts that do not usually connect (fistula) History of a tear in your stomach or intestines Protein in your urine An unusual or allergic reaction to bevacizumab, other medications, foods, dyes, or preservatives Pregnant or trying to get pregnant Breast-feeding How should I use this medication? This medication is injected into a vein. It is given by your care team in a hospital or clinic setting. Talk to your care team the use of this medication in children. Special care may be needed. Overdosage: If you think you have taken too much of this medicine contact a poison control center or emergency room at once. NOTE: This medicine is only for you. Do not share this medicine with others. What if I miss a dose? Keep appointments for follow-up doses. It is important not to miss your dose. Call your care team if you are unable to keep an appointment. What may interact with this medication? Interactions are not expected. This list may not describe all possible interactions. Give your health care provider a list of all the medicines, herbs, non-prescription drugs, or dietary supplements you use. Also tell them if you smoke, drink alcohol, or use illegal drugs. Some items may interact with your medicine. What should I watch for while using this medication? Your condition will be monitored carefully while you are receiving this medication. You may need blood work while taking this medication. This medication may make you feel generally unwell. This is not uncommon as chemotherapy can affect healthy cells as well as cancer cells. Report any side effects. Continue your course of treatment even though you feel ill unless your care team tells you to stop. This medication may increase your risk to bruise or bleed. Call your care team if you notice any unusual bleeding. Before having surgery, talk to your care team to make sure it is ok. This medication can increase the risk of poor healing  of your surgical site or wound. You will need to stop this medication for 28 days before surgery. After surgery, wait at least 28 days before restarting this medication. Make sure the surgical site or wound is healed enough before restarting this medication. Talk to your care team if questions. Talk to your care team if you may be pregnant. Serious birth defects can occur if you take this medication during pregnancy and for 6 months after the last dose. Contraception is recommended while taking this medication and for 6 months after the last dose. Your care team can help you find the option that works for you. Do not breastfeed while taking this medication and for 6 months after the last dose. This medication can cause infertility. Talk to your care team if you are concerned about your fertility. What side effects may I notice from receiving this medication? Side effects that you should report to your care team as soon as possible: Allergic reactions--skin rash, itching, hives, swelling of the face, lips, tongue, or throat Bleeding--bloody or black, tar-like stools, vomiting blood or brown material that looks like coffee grounds, red or dark brown urine, small red or purple spots on skin, unusual bruising or bleeding Blood clot--pain, swelling,  or warmth in the leg, shortness of breath, chest pain Heart attack--pain or tightness in the chest, shoulders, arms, or jaw, nausea, shortness of breath, cold or clammy skin, feeling faint or lightheaded Heart failure--shortness of breath, swelling of the ankles, feet, or hands, sudden weight gain, unusual weakness or fatigue Increase in blood pressure Infection--fever, chills, cough, sore throat, wounds that don't heal, pain or trouble when passing urine, general feeling of discomfort or being unwell Infusion reactions--chest pain, shortness of breath or trouble breathing, feeling faint or lightheaded Kidney injury--decrease in the amount of urine, swelling of  the ankles, hands, or feet Stomach pain that is severe, does not go away, or gets worse Stroke--sudden numbness or weakness of the face, arm, or leg, trouble speaking, confusion, trouble walking, loss of balance or coordination, dizziness, severe headache, change in vision Sudden and severe headache, confusion, change in vision, seizures, which may be signs of posterior reversible encephalopathy syndrome (PRES) Side effects that usually do not require medical attention (report to your care team if they continue or are bothersome): Back pain Change in taste Diarrhea Dry skin Increased tears Nosebleed This list may not describe all possible side effects. Call your doctor for medical advice about side effects. You may report side effects to FDA at 1-800-FDA-1088. Where should I keep my medication? This medication is given in a hospital or clinic. It will not be stored at home. NOTE: This sheet is a summary. It may not cover all possible information. If you have questions about this medicine, talk to your doctor, pharmacist, or health care provider.  2023 Elsevier/Gold Standard (2022-03-20 00:00:00)  Fluorouracil Injection What is this medication? FLUOROURACIL (flure oh YOOR a sil) treats some types of cancer. It works by slowing down the growth of cancer cells. This medicine may be used for other purposes; ask your health care provider or pharmacist if you have questions. COMMON BRAND NAME(S): Adrucil What should I tell my care team before I take this medication? They need to know if you have any of these conditions: Blood disorders Dihydropyrimidine dehydrogenase (DPD) deficiency Infection, such as chickenpox, cold sores, herpes Kidney disease Liver disease Poor nutrition Recent or ongoing radiation therapy An unusual or allergic reaction to fluorouracil, other medications, foods, dyes, or preservatives If you or your partner are pregnant or trying to get pregnant Breast-feeding How  should I use this medication? This medication is injected into a vein. It is administered by your care team in a hospital or clinic setting. Talk to your care team about the use of this medication in children. Special care may be needed. Overdosage: If you think you have taken too much of this medicine contact a poison control center or emergency room at once. NOTE: This medicine is only for you. Do not share this medicine with others. What if I miss a dose? Keep appointments for follow-up doses. It is important not to miss your dose. Call your care team if you are unable to keep an appointment. What may interact with this medication? Do not take this medication with any of the following: Live virus vaccines This medication may also interact with the following: Medications that treat or prevent blood clots, such as warfarin, enoxaparin, dalteparin This list may not describe all possible interactions. Give your health care provider a list of all the medicines, herbs, non-prescription drugs, or dietary supplements you use. Also tell them if you smoke, drink alcohol, or use illegal drugs. Some items may interact with your medicine. What should  I watch for while using this medication? Your condition will be monitored carefully while you are receiving this medication. This medication may make you feel generally unwell. This is not uncommon as chemotherapy can affect healthy cells as well as cancer cells. Report any side effects. Continue your course of treatment even though you feel ill unless your care team tells you to stop. In some cases, you may be given additional medications to help with side effects. Follow all directions for their use. This medication may increase your risk of getting an infection. Call your care team for advice if you get a fever, chills, sore throat, or other symptoms of a cold or flu. Do not treat yourself. Try to avoid being around people who are sick. This medication may  increase your risk to bruise or bleed. Call your care team if you notice any unusual bleeding. Be careful brushing or flossing your teeth or using a toothpick because you may get an infection or bleed more easily. If you have any dental work done, tell your dentist you are receiving this medication. Avoid taking medications that contain aspirin, acetaminophen, ibuprofen, naproxen, or ketoprofen unless instructed by your care team. These medications may hide a fever. Do not treat diarrhea with over the counter products. Contact your care team if you have diarrhea that lasts more than 2 days or if it is severe and watery. This medication can make you more sensitive to the sun. Keep out of the sun. If you cannot avoid being in the sun, wear protective clothing and sunscreen. Do not use sun lamps, tanning beds, or tanning booths. Talk to your care team if you or your partner wish to become pregnant or think you might be pregnant. This medication can cause serious birth defects if taken during pregnancy and for 3 months after the last dose. A reliable form of contraception is recommended while taking this medication and for 3 months after the last dose. Talk to your care team about effective forms of contraception. Do not father a child while taking this medication and for 3 months after the last dose. Use a condom while having sex during this time period. Do not breastfeed while taking this medication. This medication may cause infertility. Talk to your care team if you are concerned about your fertility. What side effects may I notice from receiving this medication? Side effects that you should report to your care team as soon as possible: Allergic reactions--skin rash, itching, hives, swelling of the face, lips, tongue, or throat Heart attack--pain or tightness in the chest, shoulders, arms, or jaw, nausea, shortness of breath, cold or clammy skin, feeling faint or lightheaded Heart failure--shortness of  breath, swelling of the ankles, feet, or hands, sudden weight gain, unusual weakness or fatigue Heart rhythm changes--fast or irregular heartbeat, dizziness, feeling faint or lightheaded, chest pain, trouble breathing High ammonia level--unusual weakness or fatigue, confusion, loss of appetite, nausea, vomiting, seizures Infection--fever, chills, cough, sore throat, wounds that don't heal, pain or trouble when passing urine, general feeling of discomfort or being unwell Low red blood cell level--unusual weakness or fatigue, dizziness, headache, trouble breathing Pain, tingling, or numbness in the hands or feet, muscle weakness, change in vision, confusion or trouble speaking, loss of balance or coordination, trouble walking, seizures Redness, swelling, and blistering of the skin over hands and feet Severe or prolonged diarrhea Unusual bruising or bleeding Side effects that usually do not require medical attention (report to your care team if they continue or  are bothersome): Dry skin Headache Increased tears Nausea Pain, redness, or swelling with sores inside the mouth or throat Sensitivity to light Vomiting This list may not describe all possible side effects. Call your doctor for medical advice about side effects. You may report side effects to FDA at 1-800-FDA-1088. Where should I keep my medication? This medication is given in a hospital or clinic. It will not be stored at home. NOTE: This sheet is a summary. It may not cover all possible information. If you have questions about this medicine, talk to your doctor, pharmacist, or health care provider.  2023 Elsevier/Gold Standard (2022-03-13 00:00:00)  The chemotherapy medication bag should finish at 46 hours, 96 hours, or 7 days. For example, if your pump is scheduled for 46 hours and it was put on at 4:00 p.m., it should finish at 2:00 p.m. the day it is scheduled to come off regardless of your appointment time.     Estimated time to  finish at 11:00 a.m. on Thursday 09/13/2022.   If the display on your pump reads "Low Volume" and it is beeping, take the batteries out of the pump and come to the cancer center for it to be taken off.   If the pump alarms go off prior to the pump reading "Low Volume" then call [phone] and someone can assist you.  If the plunger comes out and the chemotherapy medication is leaking out, please use your home chemo spill kit to clean up the spill. Do NOT use paper towels or other household products.  If you have problems or questions regarding your pump, please call either [phone] (24 hours a day) or the cancer center Monday-Friday 8:00 a.m.- 4:30 p.m. at the clinic number and we will assist you. If you are unable to get assistance, then go to the nearest Emergency Department and ask the staff to contact the IV team for assistance.

## 2022-09-12 ENCOUNTER — Other Ambulatory Visit: Payer: Self-pay

## 2022-09-12 DIAGNOSIS — C185 Malignant neoplasm of splenic flexure: Secondary | ICD-10-CM | POA: Diagnosis not present

## 2022-09-13 ENCOUNTER — Inpatient Hospital Stay: Payer: Medicare Other

## 2022-09-13 VITALS — BP 141/82 | HR 81 | Temp 98.9°F | Resp 20

## 2022-09-13 DIAGNOSIS — R911 Solitary pulmonary nodule: Secondary | ICD-10-CM | POA: Diagnosis not present

## 2022-09-13 DIAGNOSIS — I2510 Atherosclerotic heart disease of native coronary artery without angina pectoris: Secondary | ICD-10-CM | POA: Diagnosis not present

## 2022-09-13 DIAGNOSIS — C185 Malignant neoplasm of splenic flexure: Secondary | ICD-10-CM

## 2022-09-13 DIAGNOSIS — T451X5A Adverse effect of antineoplastic and immunosuppressive drugs, initial encounter: Secondary | ICD-10-CM | POA: Diagnosis not present

## 2022-09-13 DIAGNOSIS — D709 Neutropenia, unspecified: Secondary | ICD-10-CM | POA: Diagnosis not present

## 2022-09-13 DIAGNOSIS — G620 Drug-induced polyneuropathy: Secondary | ICD-10-CM | POA: Diagnosis not present

## 2022-09-13 DIAGNOSIS — I700 Atherosclerosis of aorta: Secondary | ICD-10-CM | POA: Diagnosis not present

## 2022-09-13 DIAGNOSIS — C787 Secondary malignant neoplasm of liver and intrahepatic bile duct: Secondary | ICD-10-CM | POA: Diagnosis not present

## 2022-09-13 DIAGNOSIS — C186 Malignant neoplasm of descending colon: Secondary | ICD-10-CM | POA: Diagnosis not present

## 2022-09-13 DIAGNOSIS — I4891 Unspecified atrial fibrillation: Secondary | ICD-10-CM | POA: Diagnosis not present

## 2022-09-13 DIAGNOSIS — Z5111 Encounter for antineoplastic chemotherapy: Secondary | ICD-10-CM | POA: Diagnosis not present

## 2022-09-13 DIAGNOSIS — R197 Diarrhea, unspecified: Secondary | ICD-10-CM | POA: Diagnosis not present

## 2022-09-13 MED ORDER — SODIUM CHLORIDE 0.9% FLUSH
10.0000 mL | INTRAVENOUS | Status: DC | PRN
  Administered 2022-09-13: 10 mL

## 2022-09-13 MED ORDER — HEPARIN SOD (PORK) LOCK FLUSH 100 UNIT/ML IV SOLN
500.0000 [IU] | Freq: Once | INTRAVENOUS | Status: AC | PRN
  Administered 2022-09-13: 500 [IU]

## 2022-09-13 NOTE — Patient Instructions (Signed)

## 2022-09-14 IMAGING — CT CT CHEST W/ CM
2 of 5 series · 11 of 36 positions shown, 13 images · IV contrast (APPLIED)
Comparison: May 2020

CLINICAL DATA: Colorectal cancer, assess treatment response and
restage metastatic colorectal cancer

EXAM:
CT CHEST, ABDOMEN, AND PELVIS WITH CONTRAST
TECHNIQUE: Multidetector CT imaging of the chest, abdomen and pelvis was
performed following the standard protocol during bolus
administration of intravenous contrast.
CONTRAST:  100mL OMNIPAQUE IOHEXOL 300 MG/ML  SOLN

[Series 2: cap with · axial · 0.75mm/px · z∈[-431,+64]mm · 8 of 125 slices shown, 10 images]
[im 13/125  mediastinal]
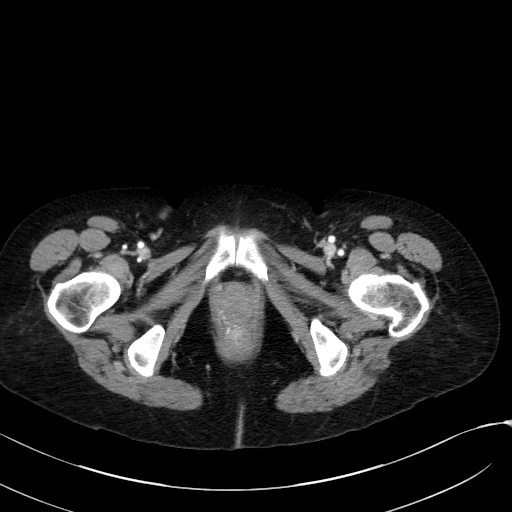
[im 13/125  lung]
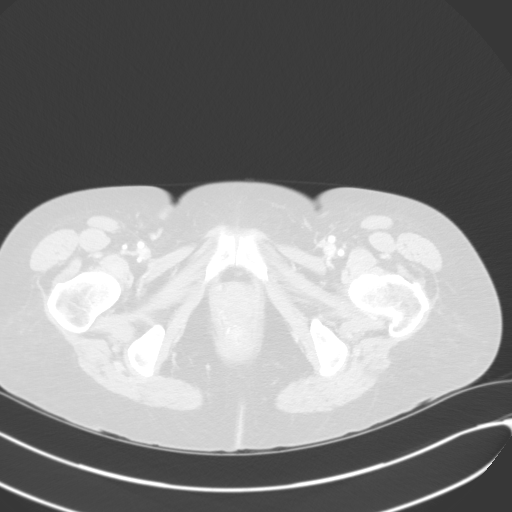
[im 25/125  lung]
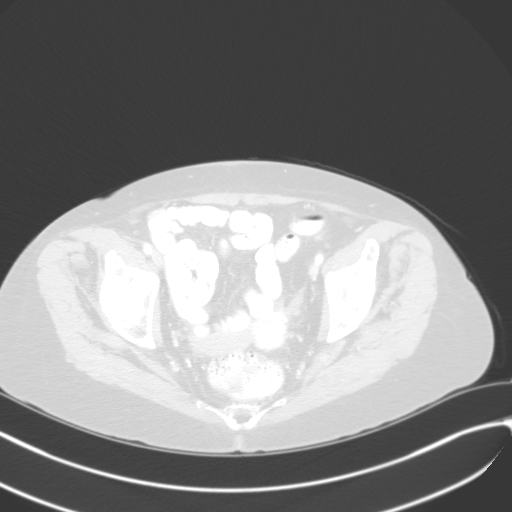
[im 38/125  lung]
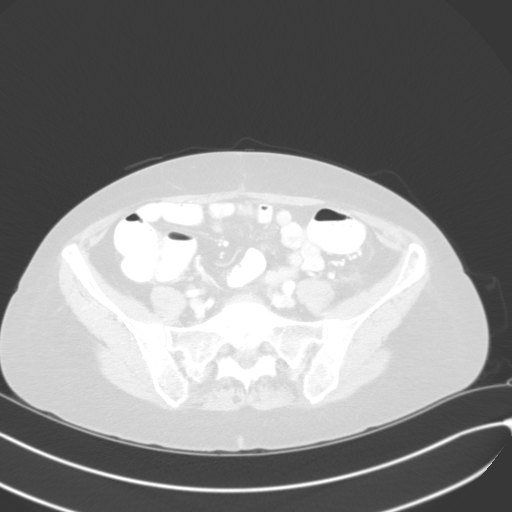
[im 50/125  lung]
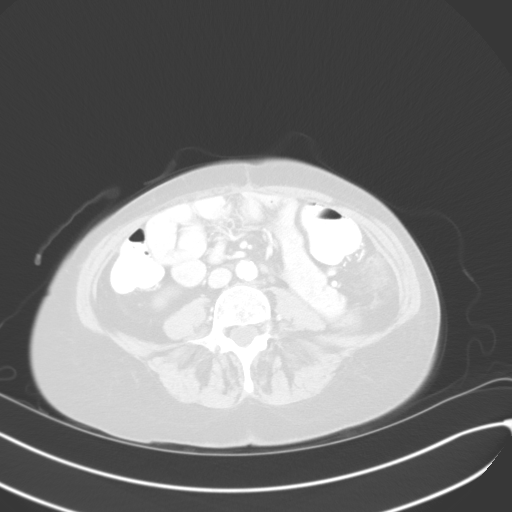
[im 75/125  mediastinal]
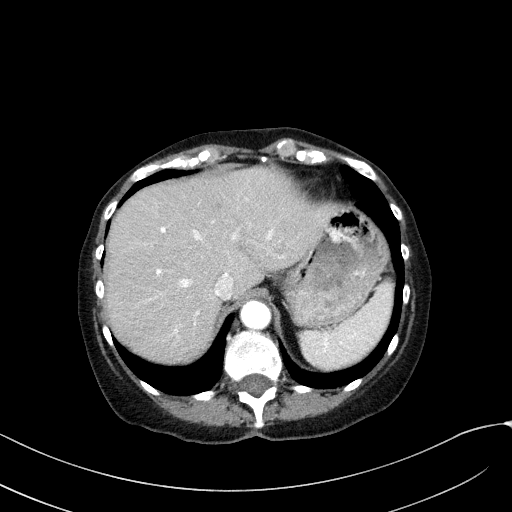
[im 75/125  lung]
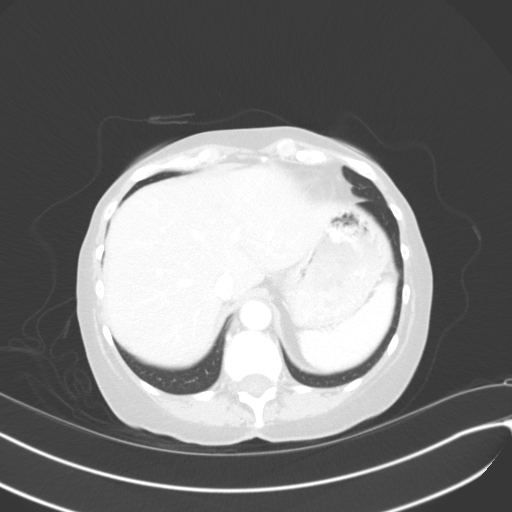
[im 87/125  lung]
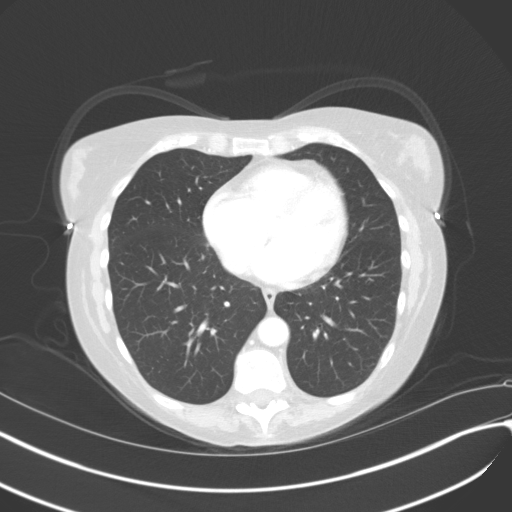
[im 100/125  lung]
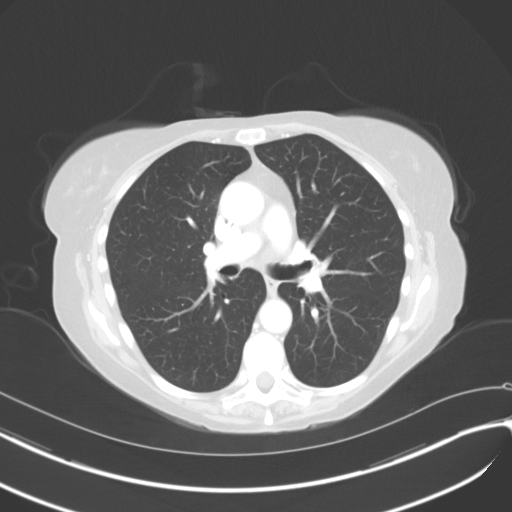
[im 112/125  lung]
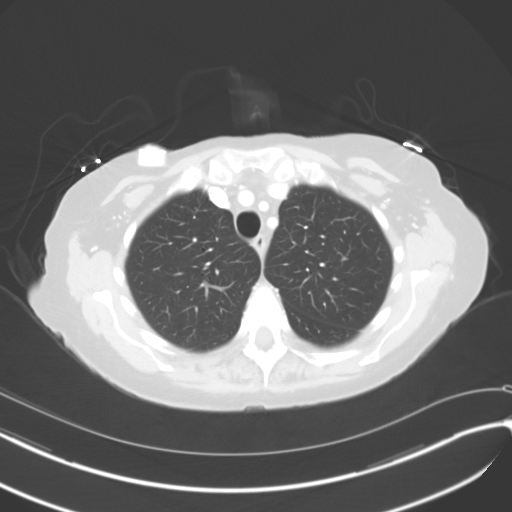

[Series 5: coronals · coronal · 0.66mm/px · 3 of 132 slices shown]
[im 27/132  lung]
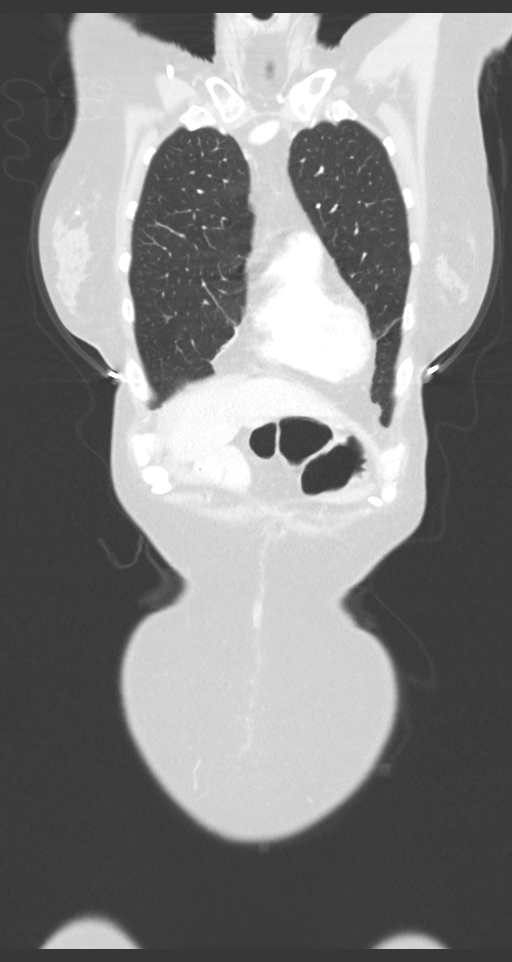
[im 53/132  lung]
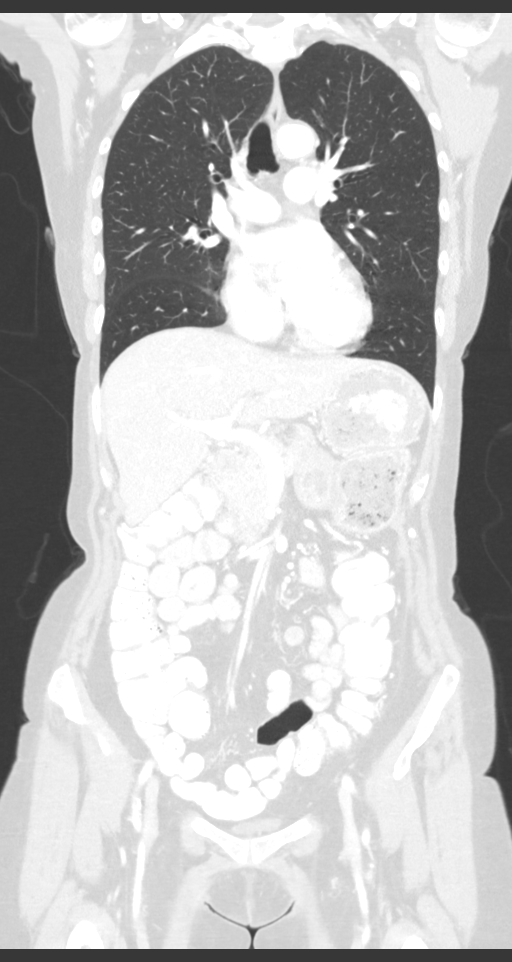
[im 79/132  lung]
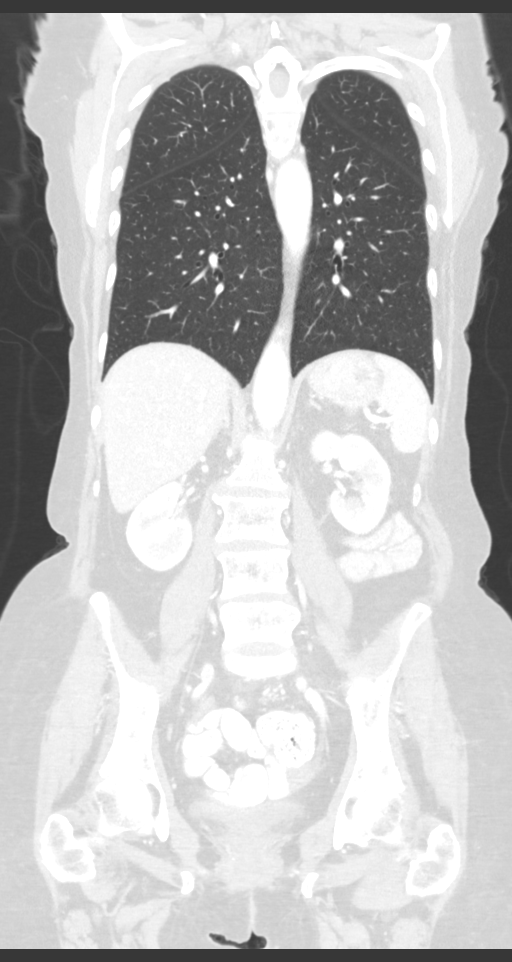

[11 of 36 positions shown; findings below may reference images not displayed]

FINDINGS: CT CHEST FINDINGS

Cardiovascular: Heart size is normal without pericardial effusion.
Central pulmonary vasculature is normal caliber. Aortic caliber is
normal. Calcified and noncalcified atheromatous plaque in the
thoracic aorta. RIGHT-sided Port-A-Cath terminates at the caval to
atrial junction.

Mediastinum/Nodes: No mediastinal, hilar, axillary or
supraclavicular lymphadenopathy by size criteria.

Mildly enlarged LEFT supraclavicular lymph node (image 7 of series
2) at 6 mm has diminished in size compared to the prior PET scan
where it measured approximately 8-9 mm.

Precarinal lymph node previously approximately 1 cm now at 7 mm.

High RIGHT paratracheal lymph nodes also similar dim managed in
size. Pre-vascular lymph nodes with similar decrease in size, the
largest prevascular lymph node currently is approximately 4 mm.

Scattered small axillary lymph nodes none with pathologic
enlargement also diminished in size.

Lungs/Pleura: No acute pulmonary process. Minimal scarring along
RIGHT heart border in the RIGHT middle lobe and in the lingula.
Airways are patent. No suspicious pulmonary nodule.

Musculoskeletal: No chest wall mass. The area of increased FDG
uptake along the posterior LEFT chest and in the intercostal
musculature seen on the PET-CT shows no current imaging correlate
see below for full musculoskeletal details.

CT ABDOMEN PELVIS FINDINGS

Hepatobiliary: Hepatic metastatic lesions are decreased in size
compared to the prior PET scan not well assessed due to lack of
contrast but larger than on the study May 26, 2020.

(Image 55, series 2) 11 mm anterior medial segment LEFT hepatic lobe
previously 15 mm.

Disease at the upper margin of the RIGHT hemi liver that measured
potentially as large as 3.8 cm on the prior study now with 2 areas
of low attenuation in this location anterior to the IVC and between
middle and RIGHT hepatic veins in hepatic subsegment VIII (image 46,
series [DATE] areas measuring 9 mm.

In the dome of the RIGHT hemi liver (image 48, series 2) 6 mm lesion
previously 20 mm.

Small lesion in the posterior RIGHT hepatic lobe may be slightly
smaller as well on image 55 of series 2 measuring 6 as compared to
11 mm.

LEFT lateral segment lesions are very vague and also implied
diminished size. In a 6 mm focus of low attenuation is noted in the
LEFT lateral segment. The dominant lesion lateral segment LEFT
hepatic lobe is essentially not visible on current CT and may have
resolved.

No pericholecystic stranding or biliary duct dilation.

Pancreas: Pancreas normal without ductal dilation or sign of
inflammation.

Spleen: Spleen normal in size and contour.

Adrenals/Urinary Tract: Adrenal glands are normal.

Symmetric renal enhancement. No hydronephrosis. Urinary bladder is
normal.

Stomach/Bowel: No acute gastrointestinal process following LEFT
hemicolectomy. Stranding in the LEFT retroperitoneum with an ovoid
area of predominantly fatty density measuring approximately 3.1 x
3.0 x 6.2 cm. Anastomotic site in the pelvis without surrounding
stranding. Appendix slightly more distended on the prior study but
without signs of inflammation, stool like material in the lumen. No
wall thickening.

Vascular/Lymphatic: Calcified and noncalcified atheromatous plaque
in the abdominal aorta without aneurysmal dilation. Subtle stranding
at the site of previous cluster of lymph nodes and at the porta
hepatis in the upper abdomen along celiac stations.

Ill-defined small lymph nodes track along the LEFT periaortic chain
largest 5 mm previously lymph nodes and groups of nodes measuring up
to 14 mm in this location.

No pelvic nodal enlargement also with resolution of LEFT common
iliac lymph nodes that were seen on the prior examination.

No pelvic sidewall lymphadenopathy.

Reproductive: Uterus and adnexa unremarkable by CT.

Other: No sign of ascites.  No peritoneal nodularity.

Musculoskeletal: No destructive bone finding. No acute bone process.
Spinal degenerative changes.
IMPRESSION: 1. Response to therapy with diminished size of lymph nodes
throughout the chest, abdomen and pelvis as well as hepatic
metastatic disease.
2. No new signs of disease in the chest, abdomen or pelvis.
3. Fat density area with surrounding stranding presumably fat
necrosis following LEFT hemicolectomy. Attention on follow-up is
suggested given the presence of surrounding soft tissue extending in
the retroperitoneum on the prior study. This is diminished when
compared to the prior PET scan.
4. Stranding in the LEFT retroperitoneum with an ovoid area of
predominantly fatty density measuring approximately 3.1 x 3.0 x
cm. This is likely related to prior surgery. Attention on follow-up.
5. Appendix now top-normal in size but without signs of surrounding
inflammation, approximately 6-7 mm as compared to 4-5 mm caliber.
Likely further distended with stool rather than related to acute
process. Correlate with any symptoms of abdominal pain.
6. Aortic atherosclerosis.

These results will be called to the ordering clinician or
representative by the Radiologist Assistant, and communication
documented in the PACS or [REDACTED].

Aortic Atherosclerosis ([id number]).

## 2022-09-17 ENCOUNTER — Other Ambulatory Visit (HOSPITAL_COMMUNITY): Payer: Self-pay

## 2022-09-22 ENCOUNTER — Other Ambulatory Visit: Payer: Self-pay

## 2022-10-02 ENCOUNTER — Inpatient Hospital Stay: Payer: Medicare Other

## 2022-10-02 ENCOUNTER — Encounter: Payer: Self-pay | Admitting: *Deleted

## 2022-10-02 ENCOUNTER — Inpatient Hospital Stay: Payer: Medicare Other | Attending: Oncology | Admitting: Oncology

## 2022-10-02 VITALS — BP 148/71 | HR 69 | Temp 98.1°F | Resp 18 | Ht 66.0 in | Wt 157.0 lb

## 2022-10-02 VITALS — BP 150/70 | HR 65 | Resp 20

## 2022-10-02 DIAGNOSIS — D709 Neutropenia, unspecified: Secondary | ICD-10-CM | POA: Insufficient documentation

## 2022-10-02 DIAGNOSIS — R918 Other nonspecific abnormal finding of lung field: Secondary | ICD-10-CM | POA: Diagnosis not present

## 2022-10-02 DIAGNOSIS — I4891 Unspecified atrial fibrillation: Secondary | ICD-10-CM | POA: Insufficient documentation

## 2022-10-02 DIAGNOSIS — C186 Malignant neoplasm of descending colon: Secondary | ICD-10-CM | POA: Insufficient documentation

## 2022-10-02 DIAGNOSIS — E876 Hypokalemia: Secondary | ICD-10-CM | POA: Insufficient documentation

## 2022-10-02 DIAGNOSIS — G629 Polyneuropathy, unspecified: Secondary | ICD-10-CM | POA: Diagnosis not present

## 2022-10-02 DIAGNOSIS — C185 Malignant neoplasm of splenic flexure: Secondary | ICD-10-CM

## 2022-10-02 DIAGNOSIS — Z5111 Encounter for antineoplastic chemotherapy: Secondary | ICD-10-CM | POA: Diagnosis not present

## 2022-10-02 DIAGNOSIS — C787 Secondary malignant neoplasm of liver and intrahepatic bile duct: Secondary | ICD-10-CM | POA: Insufficient documentation

## 2022-10-02 DIAGNOSIS — M7989 Other specified soft tissue disorders: Secondary | ICD-10-CM | POA: Diagnosis not present

## 2022-10-02 LAB — CMP (CANCER CENTER ONLY)
ALT: 21 U/L (ref 0–44)
AST: 29 U/L (ref 15–41)
Albumin: 3.9 g/dL (ref 3.5–5.0)
Alkaline Phosphatase: 61 U/L (ref 38–126)
Anion gap: 7 (ref 5–15)
BUN: 23 mg/dL (ref 8–23)
CO2: 26 mmol/L (ref 22–32)
Calcium: 9.2 mg/dL (ref 8.9–10.3)
Chloride: 102 mmol/L (ref 98–111)
Creatinine: 1.04 mg/dL — ABNORMAL HIGH (ref 0.44–1.00)
GFR, Estimated: 54 mL/min — ABNORMAL LOW (ref >60)
Glucose, Bld: 82 mg/dL (ref 70–99)
Potassium: 4.6 mmol/L (ref 3.5–5.1)
Sodium: 135 mmol/L (ref 135–145)
Total Bilirubin: 0.4 mg/dL (ref 0.3–1.2)
Total Protein: 6.5 g/dL (ref 6.5–8.1)

## 2022-10-02 LAB — CBC WITH DIFFERENTIAL (CANCER CENTER ONLY)
Abs Immature Granulocytes: 0.01 10*3/uL (ref 0.00–0.07)
Basophils Absolute: 0 10*3/uL (ref 0.0–0.1)
Basophils Relative: 1 %
Eosinophils Absolute: 0.3 10*3/uL (ref 0.0–0.5)
Eosinophils Relative: 5 %
HCT: 37.8 % (ref 36.0–46.0)
Hemoglobin: 12.6 g/dL (ref 12.0–15.0)
Immature Granulocytes: 0 %
Lymphocytes Relative: 36 %
Lymphs Abs: 1.8 10*3/uL (ref 0.7–4.0)
MCH: 35.6 pg — ABNORMAL HIGH (ref 26.0–34.0)
MCHC: 33.3 g/dL (ref 30.0–36.0)
MCV: 106.8 fL — ABNORMAL HIGH (ref 80.0–100.0)
Monocytes Absolute: 0.5 10*3/uL (ref 0.1–1.0)
Monocytes Relative: 9 %
Neutro Abs: 2.5 10*3/uL (ref 1.7–7.7)
Neutrophils Relative %: 49 %
Platelet Count: 189 10*3/uL (ref 150–400)
RBC: 3.54 MIL/uL — ABNORMAL LOW (ref 3.87–5.11)
RDW: 13.8 % (ref 11.5–15.5)
WBC Count: 5 10*3/uL (ref 4.0–10.5)
nRBC: 0 % (ref 0.0–0.2)

## 2022-10-02 MED ORDER — SODIUM CHLORIDE 0.9 % IV SOLN: Freq: Once | INTRAVENOUS | Status: AC

## 2022-10-02 MED ORDER — SODIUM CHLORIDE 0.9 % IV SOLN
7.5000 mg/kg | Freq: Once | INTRAVENOUS | Status: AC
  Administered 2022-10-02: 500 mg via INTRAVENOUS
  Filled 2022-10-02: qty 4

## 2022-10-02 MED ORDER — SODIUM CHLORIDE 0.9 % IV SOLN
1600.0000 mg/m2 | INTRAVENOUS | Status: DC
  Administered 2022-10-02: 2900 mg via INTRAVENOUS
  Filled 2022-10-02: qty 58

## 2022-10-02 NOTE — Progress Notes (Signed)
[facility] OFFICE PROGRESS NOTE   Diagnosis: Colon cancer  INTERVAL HISTORY:   Kathy Robinson returns as scheduled.  S no thrush or ulcers he completed another cycle of 5-FU/bevacizumab on 09/11/2022.  No nausea, mouth sores, diarrhea, bleeding, or symptom of thrombosis.  She reports a transient increase in neuropathy symptoms following chemotherapy.  She also noted mild swelling in the feet.  Objective:  Vital signs in last 24 hours:  Blood pressure (!) 148/71, pulse 69, temperature 98.1 F (36.7 C), temperature source Oral, resp. rate 18, height 5' 6" (1.676 m), weight 157 lb (71.2 kg), last menstrual period 11/19/1996, SpO2 100 %.    HEENT: No thrush or ulcers Resp: Lungs clear bilaterally Cardio: Regular rate and rhythm GI: No hepatosplenomegaly, nontender Vascular: No leg or foot edema   Portacath/PICC-without erythema  Lab Results:  Lab Results  Component Value Date   WBC 5.0 10/02/2022   HGB 12.6 10/02/2022   HCT 37.8 10/02/2022   MCV 106.8 (H) 10/02/2022   PLT 189 10/02/2022   NEUTROABS 2.5 10/02/2022    CMP  Lab Results  Component Value Date   NA 139 09/11/2022   K 4.6 09/11/2022   CL 104 09/11/2022   CO2 28 09/11/2022   GLUCOSE 80 09/11/2022   BUN 22 09/11/2022   CREATININE 0.98 09/11/2022   CALCIUM 9.9 09/11/2022   PROT 6.5 09/11/2022   ALBUMIN 4.1 09/11/2022   AST 25 09/11/2022   ALT 16 09/11/2022   ALKPHOS 51 09/11/2022   BILITOT 0.5 09/11/2022   GFRNONAA 58 (L) 09/11/2022   GFRAA >60 08/16/2020    Lab Results  Component Value Date   CEA1 1.16 03/29/2021   CEA 2.10 09/11/2022     Medications: I have reviewed the patient's current medications.   Assessment/Plan: Adenocarcinoma the left colon, stage IIIc (pT4b,pN2a), status post a left colectomy 05/30/2020, MSS, TMB 13, BRAF V600E Tumor invades the visceral peritoneum, lymphovascular and perineural invasion present, for tumor deposits, 7/13 lymph nodes, negative  resection margins, no loss of mismatch repair protein expression CT abdomen/pelvis 05/26/2020-long segment of masslike thickening of the descending colon with associated high-grade colonic obstruction, small colonic wall defect with evidence of a focally contained microperforation, retroperitoneal adenopathy, indeterminate small hypodense liver lesions Elevated preoperative CEA PET 06/22/2020-hypermetabolic liver metastases, periportal and periaortic adenopathy, hypermetabolic mediastinal and left supraclavicular nodes Cycle 1 FOLFOX 07/05/20 Cycle 2 FOLFOXIRI (dose-reduced 5FU, no bolus) and Avastin 07/19/20  Cycle 3 FOLFOXIRI (Dose reduced 5-FU, no bolus)/Avastin 08/02/2020 Cycle 4 FOLFOXIRI/Avastin 08/16/2020 (Udenyca added) Cycle 5 FOLFOXIRI/Avastin 08/30/2020 CTs 09/09/2020-diminished size of lymph nodes in the chest, abdomen, and pelvis.  Decreased hepatic metastases.  No new evidence of disease progression, fat density lesion surrounding the left hemicolectomy Cycle 6 FOLFOXIRI/Avastin 09/13/2020 Cycle 7 FOLFOX/Avastin 09/27/2020 (irinotecan held) Cycle 8 FOLFOXIRI/Avastin 10/18/2020  Cycle 9 FOLFOXIRI/Avastin 11/01/2020 Cycle 10 FOLFOXIRI/Avastin 11/22/2020 (oxaliplatin held, irinotecan and 5-FU dose reduced) Cycle 11 FOLFOXIRI/Avastin 12/12/2020 (oxaliplatin held) CTs 12/19/2020-decrease in size of liver metastases, no evidence of disease progression Cycle 12 FOLFOXIRI/Avastin 01/04/2021 (oxaliplatin held) Cycle 13 FOLFOXIRI/Avastin 01/24/2021 (oxaliplatin held) Cycle 14 FOLFOXIRI/Avastin 02/14/2021 (oxaliplatin held) Cycle 15 FOLFOXIRI/Avastin 03/07/2021 (oxaliplatin held) Cycle 16 FOLFOXIRI/Avastin 03/29/2021 (oxaliplatin held) CTs 04/14/2021- decreased size of liver metastases.  No new or progressive findings. Cycle 17 FOLFOXIRI/Avastin 04/19/2021 (oxaliplatin held) Cycle 18 FOLFOXIRI/Avastin 05/09/2021 (oxaliplatin held) Cycle 19 FOLFOXIRI/Avastin 05/30/2021 (oxaliplatin held) Cycle 20  FOLFOXIRI/Avastin 06/20/2021 (oxaliplatin held) Cycle 21 FOLFOXIRI/Avastin 07/11/2021 (oxaliplatin held) CTs 07/28/2021-unchanged subcentimeter liver lesions, no evidence of new  metastatic disease Cycle 22 5-FU/ Avastin 08/01/2021 Cycle 23 5-FU/Avastin 08/22/2021 Cycle 24 5-FU/Avastin 09/12/2021 Cycle 25 5-FU/Avastin 10/03/2021 Cycle 26 5-FU/Avastin 10/24/2021 Cycle 27 5-FU/Avastin 11/15/2021 Cycle 28 5-FU/Avastin 12/05/2021 CTs 12/22/2021-grossly similar subcentimeter low-attenuation lesions in the liver compatible with treated metastasis.  No evidence for new metastatic disease in the chest, abdomen or pelvis. Cycle 29 5-FU/Avastin 12/26/2021 Cycle 30 5-FU/Avastin 01/16/2022 Cycle 41 5-FU/Avastin 02/06/2022 Cycle 42 5-FU/Avastin 02/27/2022 Cycle 43 5-FU/Avastin 03/20/2022 Cycle 44 5-FU/Avastin 04/10/2022 CT 04/28/2022-interval development of a 3 mm nodule within the left lower lobe and 3 mm nodule within the right lower lobe, nonspecific.  Unchanged subcentimeter low-attenuation lesions in the liver compatible with treated metastasis. Cycle 45 5-FU/Avastin 05/01/2022 Cycle 46 5-FU/Avastin 05/29/2022 Cycle 47 5-FU/Avastin 06/19/2022 Cycle 48 5-FU/Avastin 07/10/2022 Cycle 49 5-FU/Avastin 07/31/2022 Cycle 50 5-FU/Avastin 08/21/2022 CTs 09/07/2022-no evidence of metastatic disease, tiny lung nodules described as new on previous exam have resolved, no hepatic metastases are appreciated Cycle 51 5-FU/Avastin 09/11/2022 Cycle 52 5-FU/Avastin 10/02/2022     Atrial fibrillation with rapid ventricular response 05/26/2020 Mild neutropenia following cycle 3 FOLFOXIRI, Udenyca added with cycle 4 Hypokalemia secondary to diarrhea-potassium supplementation starting 09/13/2020 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 Games appears stable.  She continues to tolerate the 5-FU/Avastin well.   There is no clinical evidence of disease progression.  She will complete another cycle today.  She will return for an office visit and chemotherapy in 3 weeks.  [name], MD  10/02/2022  11:19 AM

## 2022-10-02 NOTE — Progress Notes (Signed)
Patient seen by Dr. Sherrill today ? ?Vitals are within treatment parameters. ? ?Labs reviewed by Dr. Sherrill and are within treatment parameters. ? ?Per physician team, patient is ready for treatment and there are NO modifications to the treatment plan.  ?

## 2022-10-02 NOTE — Patient Instructions (Signed)
Kathy Robinson   Discharge Instructions: Thank you for choosing [facility] to provide your oncology and hematology care.   If you have a lab appointment with the [facility], please go directly to the [facility] and check in at the registration area.   Wear comfortable clothing and clothing appropriate for easy access to any Portacath or PICC line.   We strive to give you quality time with your provider. You may need to reschedule your appointment if you arrive late (15 or more minutes).  Arriving late affects you and other patients whose appointments are after yours.  Also, if you miss three or more appointments without notifying the office, you may be dismissed from the clinic at the provider's discretion.      For prescription refill requests, have your pharmacy contact our office and allow 72 hours for refills to be completed.    Today you received the following chemotherapy and/or immunotherapy agents Bevacizumab-bvzr (ZIRABEV) & Flourouracil (ADRUCIL).      To help prevent nausea and vomiting after your treatment, we encourage you to take your nausea medication as directed.  BELOW ARE SYMPTOMS THAT SHOULD BE REPORTED IMMEDIATELY: *FEVER GREATER THAN 100.4 F (38 C) OR HIGHER *CHILLS OR SWEATING *NAUSEA AND VOMITING THAT IS NOT CONTROLLED WITH YOUR NAUSEA MEDICATION *UNUSUAL SHORTNESS OF BREATH *UNUSUAL BRUISING OR BLEEDING *URINARY PROBLEMS (pain or burning when urinating, or frequent urination) *BOWEL PROBLEMS (unusual diarrhea, constipation, pain near the anus) TENDERNESS IN MOUTH AND THROAT WITH OR WITHOUT PRESENCE OF ULCERS (sore throat, sores in mouth, or a toothache) UNUSUAL RASH, SWELLING OR PAIN  UNUSUAL VAGINAL DISCHARGE OR ITCHING   Items with * indicate a potential emergency and should be followed up as soon as possible or go to the Emergency Department if any problems should occur.  Please show the CHEMOTHERAPY ALERT CARD or  IMMUNOTHERAPY ALERT CARD at check-in to the Emergency Department and triage nurse.  Should you have questions after your visit or need to cancel or reschedule your appointment, please contact [facility]  Dept: [phone]  and follow the prompts.  Office hours are 8:00 a.m. to 4:30 p.m. Monday - Friday. Please note that voicemails left after 4:00 p.m. may not be returned until the following business day.  We are closed weekends and major holidays. You have access to a nurse at all times for urgent questions. Please call the main number to the clinic Dept: [phone] and follow the prompts.   For any non-urgent questions, you may also contact your provider using MyChart. We now offer e-Visits for anyone 64 and older to request care online for non-urgent symptoms. For details visit [email or web address].   Also download the MyChart app! Go to the app store, search "MyChart", open the app, select [facility], and log in with your MyChart username and password.  Masks are optional in the cancer centers. If you would like for your care team to wear a mask while they are taking care of you, please let them know. You may have one support person who is at least 80 years old accompany you for your appointments.  Bevacizumab Injection What is this medication? BEVACIZUMAB (be va SIZ yoo mab) treats some types of cancer. It works by blocking a protein that causes cancer cells to grow and multiply. This helps to slow or stop the spread of cancer cells. It is a monoclonal antibody. This medicine may be used for other purposes; ask  your health care provider or pharmacist if you have questions. COMMON BRAND NAME(S): Alymsys, Avastin, MVASI, [name] What should I tell my care team before I take this medication? They need to know if you have any of these conditions: Blood clots Coughing up blood Having or recent surgery Heart failure High blood pressure History of a connection  between 2 or more body parts that do not usually connect (fistula) History of a tear in your stomach or intestines Protein in your urine An unusual or allergic reaction to bevacizumab, other medications, foods, dyes, or preservatives Pregnant or trying to get pregnant Breast-feeding How should I use this medication? This medication is injected into a vein. It is given by your care team in a hospital or clinic setting. Talk to your care team the use of this medication in children. Special care may be needed. Overdosage: If you think you have taken too much of this medicine contact a poison control center or emergency room at once. NOTE: This medicine is only for you. Do not share this medicine with others. What if I miss a dose? Keep appointments for follow-up doses. It is important not to miss your dose. Call your care team if you are unable to keep an appointment. What may interact with this medication? Interactions are not expected. This list may not describe all possible interactions. Give your health care provider a list of all the medicines, herbs, non-prescription drugs, or dietary supplements you use. Also tell them if you smoke, drink alcohol, or use illegal drugs. Some items may interact with your medicine. What should I watch for while using this medication? Your condition will be monitored carefully while you are receiving this medication. You may need blood work while taking this medication. This medication may make you feel generally unwell. This is not uncommon as chemotherapy can affect healthy cells as well as cancer cells. Report any side effects. Continue your course of treatment even though you feel ill unless your care team tells you to stop. This medication may increase your risk to bruise or bleed. Call your care team if you notice any unusual bleeding. Before having surgery, talk to your care team to make sure it is ok. This medication can increase the risk of poor healing  of your surgical site or wound. You will need to stop this medication for 28 days before surgery. After surgery, wait at least 28 days before restarting this medication. Make sure the surgical site or wound is healed enough before restarting this medication. Talk to your care team if questions. Talk to your care team if you may be pregnant. Serious birth defects can occur if you take this medication during pregnancy and for 6 months after the last dose. Contraception is recommended while taking this medication and for 6 months after the last dose. Your care team can help you find the option that works for you. Do not breastfeed while taking this medication and for 6 months after the last dose. This medication can cause infertility. Talk to your care team if you are concerned about your fertility. What side effects may I notice from receiving this medication? Side effects that you should report to your care team as soon as possible: Allergic reactions--skin rash, itching, hives, swelling of the face, lips, tongue, or throat Bleeding--bloody or black, tar-like stools, vomiting blood or brown material that looks like coffee grounds, red or dark brown urine, small red or purple spots on skin, unusual bruising or bleeding Blood clot--pain, swelling,  or warmth in the leg, shortness of breath, chest pain Heart attack--pain or tightness in the chest, shoulders, arms, or jaw, nausea, shortness of breath, cold or clammy skin, feeling faint or lightheaded Heart failure--shortness of breath, swelling of the ankles, feet, or hands, sudden weight gain, unusual weakness or fatigue Increase in blood pressure Infection--fever, chills, cough, sore throat, wounds that don't heal, pain or trouble when passing urine, general feeling of discomfort or being unwell Infusion reactions--chest pain, shortness of breath or trouble breathing, feeling faint or lightheaded Kidney injury--decrease in the amount of urine, swelling of  the ankles, hands, or feet Stomach pain that is severe, does not go away, or gets worse Stroke--sudden numbness or weakness of the face, arm, or leg, trouble speaking, confusion, trouble walking, loss of balance or coordination, dizziness, severe headache, change in vision Sudden and severe headache, confusion, change in vision, seizures, which may be signs of posterior reversible encephalopathy syndrome (PRES) Side effects that usually do not require medical attention (report to your care team if they continue or are bothersome): Back pain Change in taste Diarrhea Dry skin Increased tears Nosebleed This list may not describe all possible side effects. Call your doctor for medical advice about side effects. You may report side effects to FDA at 1-800-FDA-1088. Where should I keep my medication? This medication is given in a hospital or clinic. It will not be stored at home. NOTE: This sheet is a summary. It may not cover all possible information. If you have questions about this medicine, talk to your doctor, pharmacist, or health care provider.  2023 Elsevier/Gold Standard (2022-03-09 00:00:00)  Fluorouracil Injection What is this medication? FLUOROURACIL (flure oh YOOR a sil) treats some types of cancer. It works by slowing down the growth of cancer cells. This medicine may be used for other purposes; ask your health care provider or pharmacist if you have questions. COMMON BRAND NAME(S): Adrucil What should I tell my care team before I take this medication? They need to know if you have any of these conditions: Blood disorders Dihydropyrimidine dehydrogenase (DPD) deficiency Infection, such as chickenpox, cold sores, herpes Kidney disease Liver disease Poor nutrition Recent or ongoing radiation therapy An unusual or allergic reaction to fluorouracil, other medications, foods, dyes, or preservatives If you or your partner are pregnant or trying to get pregnant Breast-feeding How  should I use this medication? This medication is injected into a vein. It is administered by your care team in a hospital or clinic setting. Talk to your care team about the use of this medication in children. Special care may be needed. Overdosage: If you think you have taken too much of this medicine contact a poison control center or emergency room at once. NOTE: This medicine is only for you. Do not share this medicine with others. What if I miss a dose? Keep appointments for follow-up doses. It is important not to miss your dose. Call your care team if you are unable to keep an appointment. What may interact with this medication? Do not take this medication with any of the following: Live virus vaccines This medication may also interact with the following: Medications that treat or prevent blood clots, such as warfarin, enoxaparin, dalteparin This list may not describe all possible interactions. Give your health care provider a list of all the medicines, herbs, non-prescription drugs, or dietary supplements you use. Also tell them if you smoke, drink alcohol, or use illegal drugs. Some items may interact with your medicine. What should  I watch for while using this medication? Your condition will be monitored carefully while you are receiving this medication. This medication may make you feel generally unwell. This is not uncommon as chemotherapy can affect healthy cells as well as cancer cells. Report any side effects. Continue your course of treatment even though you feel ill unless your care team tells you to stop. In some cases, you may be given additional medications to help with side effects. Follow all directions for their use. This medication may increase your risk of getting an infection. Call your care team for advice if you get a fever, chills, sore throat, or other symptoms of a cold or flu. Do not treat yourself. Try to avoid being around people who are sick. This medication may  increase your risk to bruise or bleed. Call your care team if you notice any unusual bleeding. Be careful brushing or flossing your teeth or using a toothpick because you may get an infection or bleed more easily. If you have any dental work done, tell your dentist you are receiving this medication. Avoid taking medications that contain aspirin, acetaminophen, ibuprofen, naproxen, or ketoprofen unless instructed by your care team. These medications may hide a fever. Do not treat diarrhea with over the counter products. Contact your care team if you have diarrhea that lasts more than 2 days or if it is severe and watery. This medication can make you more sensitive to the sun. Keep out of the sun. If you cannot avoid being in the sun, wear protective clothing and sunscreen. Do not use sun lamps, tanning beds, or tanning booths. Talk to your care team if you or your partner wish to become pregnant or think you might be pregnant. This medication can cause serious birth defects if taken during pregnancy and for 3 months after the last dose. A reliable form of contraception is recommended while taking this medication and for 3 months after the last dose. Talk to your care team about effective forms of contraception. Do not father a child while taking this medication and for 3 months after the last dose. Use a condom while having sex during this time period. Do not breastfeed while taking this medication. This medication may cause infertility. Talk to your care team if you are concerned about your fertility. What side effects may I notice from receiving this medication? Side effects that you should report to your care team as soon as possible: Allergic reactions--skin rash, itching, hives, swelling of the face, lips, tongue, or throat Heart attack--pain or tightness in the chest, shoulders, arms, or jaw, nausea, shortness of breath, cold or clammy skin, feeling faint or lightheaded Heart failure--shortness of  breath, swelling of the ankles, feet, or hands, sudden weight gain, unusual weakness or fatigue Heart rhythm changes--fast or irregular heartbeat, dizziness, feeling faint or lightheaded, chest pain, trouble breathing High ammonia level--unusual weakness or fatigue, confusion, loss of appetite, nausea, vomiting, seizures Infection--fever, chills, cough, sore throat, wounds that don't heal, pain or trouble when passing urine, general feeling of discomfort or being unwell Low red blood cell level--unusual weakness or fatigue, dizziness, headache, trouble breathing Pain, tingling, or numbness in the hands or feet, muscle weakness, change in vision, confusion or trouble speaking, loss of balance or coordination, trouble walking, seizures Redness, swelling, and blistering of the skin over hands and feet Severe or prolonged diarrhea Unusual bruising or bleeding Side effects that usually do not require medical attention (report to your care team if they continue or  are bothersome): Dry skin Headache Increased tears Nausea Pain, redness, or swelling with sores inside the mouth or throat Sensitivity to light Vomiting This list may not describe all possible side effects. Call your doctor for medical advice about side effects. You may report side effects to FDA at 1-800-FDA-1088. Where should I keep my medication? This medication is given in a hospital or clinic. It will not be stored at home. NOTE: This sheet is a summary. It may not cover all possible information. If you have questions about this medicine, talk to your doctor, pharmacist, or health care provider.  2023 Elsevier/Gold Standard (2022-03-06 00:00:00)  The chemotherapy medication bag should finish at 46 hours, 96 hours, or 7 days. For example, if your pump is scheduled for 46 hours and it was put on at 4:00 p.m., it should finish at 2:00 p.m. the day it is scheduled to come off regardless of your appointment time.     Estimated time to  finish at 11:00 a.m. on Thursday 10/04/2022.   If the display on your pump reads "Low Volume" and it is beeping, take the batteries out of the pump and come to the cancer center for it to be taken off.   If the pump alarms go off prior to the pump reading "Low Volume" then call [phone] and someone can assist you.  If the plunger comes out and the chemotherapy medication is leaking out, please use your home chemo spill kit to clean up the spill. Do NOT use paper towels or other household products.  If you have problems or questions regarding your pump, please call either [phone] (24 hours a day) or the cancer center Monday-Friday 8:00 a.m.- 4:30 p.m. at the clinic number and we will assist you. If you are unable to get assistance, then go to the nearest Emergency Department and ask the staff to contact the IV team for assistance.

## 2022-10-02 NOTE — Addendum Note (Signed)
Addended by: Tania Ade on: 10/02/2022 11:43 AM   Modules accepted: Orders

## 2022-10-03 ENCOUNTER — Other Ambulatory Visit: Payer: Self-pay

## 2022-10-04 ENCOUNTER — Inpatient Hospital Stay: Payer: Medicare Other

## 2022-10-04 VITALS — BP 132/85 | HR 91 | Temp 97.9°F | Resp 20

## 2022-10-04 DIAGNOSIS — M7989 Other specified soft tissue disorders: Secondary | ICD-10-CM | POA: Diagnosis not present

## 2022-10-04 DIAGNOSIS — G629 Polyneuropathy, unspecified: Secondary | ICD-10-CM | POA: Diagnosis not present

## 2022-10-04 DIAGNOSIS — D709 Neutropenia, unspecified: Secondary | ICD-10-CM | POA: Diagnosis not present

## 2022-10-04 DIAGNOSIS — I4891 Unspecified atrial fibrillation: Secondary | ICD-10-CM | POA: Diagnosis not present

## 2022-10-04 DIAGNOSIS — R918 Other nonspecific abnormal finding of lung field: Secondary | ICD-10-CM | POA: Diagnosis not present

## 2022-10-04 DIAGNOSIS — E876 Hypokalemia: Secondary | ICD-10-CM | POA: Diagnosis not present

## 2022-10-04 DIAGNOSIS — Z5111 Encounter for antineoplastic chemotherapy: Secondary | ICD-10-CM | POA: Diagnosis not present

## 2022-10-04 DIAGNOSIS — C186 Malignant neoplasm of descending colon: Secondary | ICD-10-CM | POA: Diagnosis not present

## 2022-10-04 DIAGNOSIS — C185 Malignant neoplasm of splenic flexure: Secondary | ICD-10-CM

## 2022-10-04 DIAGNOSIS — C787 Secondary malignant neoplasm of liver and intrahepatic bile duct: Secondary | ICD-10-CM | POA: Diagnosis not present

## 2022-10-04 MED ORDER — SODIUM CHLORIDE 0.9% FLUSH
10.0000 mL | INTRAVENOUS | Status: DC | PRN
  Administered 2022-10-04: 10 mL

## 2022-10-04 MED ORDER — HEPARIN SOD (PORK) LOCK FLUSH 100 UNIT/ML IV SOLN
500.0000 [IU] | Freq: Once | INTRAVENOUS | Status: AC | PRN
  Administered 2022-10-04: 500 [IU]

## 2022-10-04 NOTE — Patient Instructions (Signed)

## 2022-10-16 ENCOUNTER — Other Ambulatory Visit (HOSPITAL_COMMUNITY): Payer: Self-pay

## 2022-10-21 ENCOUNTER — Other Ambulatory Visit: Payer: Self-pay | Admitting: Oncology

## 2022-10-23 ENCOUNTER — Inpatient Hospital Stay: Payer: Medicare Other

## 2022-10-23 ENCOUNTER — Encounter: Payer: Self-pay | Admitting: Nurse Practitioner

## 2022-10-23 ENCOUNTER — Inpatient Hospital Stay: Payer: Medicare Other | Attending: Oncology

## 2022-10-23 ENCOUNTER — Inpatient Hospital Stay (HOSPITAL_BASED_OUTPATIENT_CLINIC_OR_DEPARTMENT_OTHER): Payer: Medicare Other | Admitting: Nurse Practitioner

## 2022-10-23 VITALS — BP 148/81 | HR 62 | Resp 16

## 2022-10-23 DIAGNOSIS — C185 Malignant neoplasm of splenic flexure: Secondary | ICD-10-CM | POA: Diagnosis not present

## 2022-10-23 DIAGNOSIS — Z95828 Presence of other vascular implants and grafts: Secondary | ICD-10-CM

## 2022-10-23 DIAGNOSIS — C186 Malignant neoplasm of descending colon: Secondary | ICD-10-CM | POA: Diagnosis not present

## 2022-10-23 DIAGNOSIS — D709 Neutropenia, unspecified: Secondary | ICD-10-CM | POA: Insufficient documentation

## 2022-10-23 DIAGNOSIS — R609 Edema, unspecified: Secondary | ICD-10-CM | POA: Diagnosis not present

## 2022-10-23 DIAGNOSIS — Z5111 Encounter for antineoplastic chemotherapy: Secondary | ICD-10-CM | POA: Diagnosis not present

## 2022-10-23 DIAGNOSIS — G629 Polyneuropathy, unspecified: Secondary | ICD-10-CM | POA: Insufficient documentation

## 2022-10-23 DIAGNOSIS — R918 Other nonspecific abnormal finding of lung field: Secondary | ICD-10-CM | POA: Diagnosis not present

## 2022-10-23 DIAGNOSIS — I4891 Unspecified atrial fibrillation: Secondary | ICD-10-CM | POA: Insufficient documentation

## 2022-10-23 DIAGNOSIS — C787 Secondary malignant neoplasm of liver and intrahepatic bile duct: Secondary | ICD-10-CM | POA: Diagnosis not present

## 2022-10-23 DIAGNOSIS — Z5112 Encounter for antineoplastic immunotherapy: Secondary | ICD-10-CM | POA: Insufficient documentation

## 2022-10-23 LAB — CMP (CANCER CENTER ONLY)
ALT: 13 U/L (ref 0–44)
AST: 24 U/L (ref 15–41)
Albumin: 4.1 g/dL (ref 3.5–5.0)
Alkaline Phosphatase: 48 U/L (ref 38–126)
Anion gap: 8 (ref 5–15)
BUN: 27 mg/dL — ABNORMAL HIGH (ref 8–23)
CO2: 26 mmol/L (ref 22–32)
Calcium: 9.1 mg/dL (ref 8.9–10.3)
Chloride: 105 mmol/L (ref 98–111)
Creatinine: 0.99 mg/dL (ref 0.44–1.00)
GFR, Estimated: 58 mL/min — ABNORMAL LOW (ref >60)
Glucose, Bld: 94 mg/dL (ref 70–99)
Potassium: 4.4 mmol/L (ref 3.5–5.1)
Sodium: 139 mmol/L (ref 135–145)
Total Bilirubin: 0.5 mg/dL (ref 0.3–1.2)
Total Protein: 6.9 g/dL (ref 6.5–8.1)

## 2022-10-23 LAB — CBC WITH DIFFERENTIAL (CANCER CENTER ONLY)
Abs Immature Granulocytes: 0.01 10*3/uL (ref 0.00–0.07)
Basophils Absolute: 0 10*3/uL (ref 0.0–0.1)
Basophils Relative: 1 %
Eosinophils Absolute: 0.2 10*3/uL (ref 0.0–0.5)
Eosinophils Relative: 4 %
HCT: 40.8 % (ref 36.0–46.0)
Hemoglobin: 13.6 g/dL (ref 12.0–15.0)
Immature Granulocytes: 0 %
Lymphocytes Relative: 33 %
Lymphs Abs: 1.6 10*3/uL (ref 0.7–4.0)
MCH: 35.6 pg — ABNORMAL HIGH (ref 26.0–34.0)
MCHC: 33.3 g/dL (ref 30.0–36.0)
MCV: 106.8 fL — ABNORMAL HIGH (ref 80.0–100.0)
Monocytes Absolute: 0.4 10*3/uL (ref 0.1–1.0)
Monocytes Relative: 9 %
Neutro Abs: 2.6 10*3/uL (ref 1.7–7.7)
Neutrophils Relative %: 53 %
Platelet Count: 237 10*3/uL (ref 150–400)
RBC: 3.82 MIL/uL — ABNORMAL LOW (ref 3.87–5.11)
RDW: 13.7 % (ref 11.5–15.5)
WBC Count: 4.9 10*3/uL (ref 4.0–10.5)
nRBC: 0 % (ref 0.0–0.2)

## 2022-10-23 LAB — CEA (ACCESS): CEA (CHCC): 1.57 ng/mL (ref 0.00–5.00)

## 2022-10-23 LAB — TOTAL PROTEIN, URINE DIPSTICK

## 2022-10-23 MED ORDER — SODIUM CHLORIDE 0.9 % IV SOLN: Freq: Once | INTRAVENOUS | Status: AC

## 2022-10-23 MED ORDER — SODIUM CHLORIDE 0.9 % IV SOLN
1600.0000 mg/m2 | INTRAVENOUS | Status: DC
  Administered 2022-10-23: 2900 mg via INTRAVENOUS
  Filled 2022-10-23: qty 58

## 2022-10-23 MED ORDER — ALTEPLASE 2 MG IJ SOLR
2.0000 mg | Freq: Once | INTRAMUSCULAR | Status: AC
  Administered 2022-10-23: 2 mg
  Filled 2022-10-23: qty 2

## 2022-10-23 MED ORDER — SODIUM CHLORIDE 0.9 % IV SOLN
7.5000 mg/kg | Freq: Once | INTRAVENOUS | Status: AC
  Administered 2022-10-23: 500 mg via INTRAVENOUS
  Filled 2022-10-23: qty 16

## 2022-10-23 NOTE — Patient Instructions (Addendum)
Kathy Robinson   Discharge Instructions: Thank you for choosing [facility] to provide your oncology and hematology care.   If you have a lab appointment with the [facility], please go directly to the [facility] and check in at the registration area.   Wear comfortable clothing and clothing appropriate for easy access to any Portacath or PICC line.   We strive to give you quality time with your provider. You may need to reschedule your appointment if you arrive late (15 or more minutes).  Arriving late affects you and other patients whose appointments are after yours.  Also, if you miss three or more appointments without notifying the office, you may be dismissed from the clinic at the provider's discretion.      For prescription refill requests, have your pharmacy contact our office and allow 72 hours for refills to be completed.    Today you received the following chemotherapy and/or immunotherapy agents Bevacizumab-bvzr (ZIRABEV) & Flourouracil (ADRUCIL).      To help prevent nausea and vomiting after your treatment, we encourage you to take your nausea medication as directed.  BELOW ARE SYMPTOMS THAT SHOULD BE REPORTED IMMEDIATELY: *FEVER GREATER THAN 100.4 F (38 C) OR HIGHER *CHILLS OR SWEATING *NAUSEA AND VOMITING THAT IS NOT CONTROLLED WITH YOUR NAUSEA MEDICATION *UNUSUAL SHORTNESS OF BREATH *UNUSUAL BRUISING OR BLEEDING *URINARY PROBLEMS (pain or burning when urinating, or frequent urination) *BOWEL PROBLEMS (unusual diarrhea, constipation, pain near the anus) TENDERNESS IN MOUTH AND THROAT WITH OR WITHOUT PRESENCE OF ULCERS (sore throat, sores in mouth, or a toothache) UNUSUAL RASH, SWELLING OR PAIN  UNUSUAL VAGINAL DISCHARGE OR ITCHING   Items with * indicate a potential emergency and should be followed up as soon as possible or go to the Emergency Department if any problems should occur.  Please show the CHEMOTHERAPY ALERT CARD or  IMMUNOTHERAPY ALERT CARD at check-in to the Emergency Department and triage nurse.  Should you have questions after your visit or need to cancel or reschedule your appointment, please contact [facility]  Dept: [phone]  and follow the prompts.  Office hours are 8:00 a.m. to 4:30 p.m. Monday - Friday. Please note that voicemails left after 4:00 p.m. may not be returned until the following business day.  We are closed weekends and major holidays. You have access to a nurse at all times for urgent questions. Please call the main number to the clinic Dept: [phone] and follow the prompts.   For any non-urgent questions, you may also contact your provider using MyChart. We now offer e-Visits for anyone 64 and older to request care online for non-urgent symptoms. For details visit [email or web address].   Also download the MyChart app! Go to the app store, search "MyChart", open the app, select [facility], and log in with your MyChart username and password.  Masks are optional in the cancer centers. If you would like for your care team to wear a mask while they are taking care of you, please let them know. You may have one support person who is at least 80 years old accompany you for your appointments.  Bevacizumab Injection What is this medication? BEVACIZUMAB (be va SIZ yoo mab) treats some types of cancer. It works by blocking a protein that causes cancer cells to grow and multiply. This helps to slow or stop the spread of cancer cells. It is a monoclonal antibody. This medicine may be used for other purposes; ask  your health care provider or pharmacist if you have questions. COMMON BRAND NAME(S): Alymsys, Avastin, MVASI, [name] What should I tell my care team before I take this medication? They need to know if you have any of these conditions: Blood clots Coughing up blood Having or recent surgery Heart failure High blood pressure History of a connection  between 2 or more body parts that do not usually connect (fistula) History of a tear in your stomach or intestines Protein in your urine An unusual or allergic reaction to bevacizumab, other medications, foods, dyes, or preservatives Pregnant or trying to get pregnant Breast-feeding How should I use this medication? This medication is injected into a vein. It is given by your care team in a hospital or clinic setting. Talk to your care team the use of this medication in children. Special care may be needed. Overdosage: If you think you have taken too much of this medicine contact a poison control center or emergency room at once. NOTE: This medicine is only for you. Do not share this medicine with others. What if I miss a dose? Keep appointments for follow-up doses. It is important not to miss your dose. Call your care team if you are unable to keep an appointment. What may interact with this medication? Interactions are not expected. This list may not describe all possible interactions. Give your health care provider a list of all the medicines, herbs, non-prescription drugs, or dietary supplements you use. Also tell them if you smoke, drink alcohol, or use illegal drugs. Some items may interact with your medicine. What should I watch for while using this medication? Your condition will be monitored carefully while you are receiving this medication. You may need blood work while taking this medication. This medication may make you feel generally unwell. This is not uncommon as chemotherapy can affect healthy cells as well as cancer cells. Report any side effects. Continue your course of treatment even though you feel ill unless your care team tells you to stop. This medication may increase your risk to bruise or bleed. Call your care team if you notice any unusual bleeding. Before having surgery, talk to your care team to make sure it is ok. This medication can increase the risk of poor healing  of your surgical site or wound. You will need to stop this medication for 28 days before surgery. After surgery, wait at least 28 days before restarting this medication. Make sure the surgical site or wound is healed enough before restarting this medication. Talk to your care team if questions. Talk to your care team if you may be pregnant. Serious birth defects can occur if you take this medication during pregnancy and for 6 months after the last dose. Contraception is recommended while taking this medication and for 6 months after the last dose. Your care team can help you find the option that works for you. Do not breastfeed while taking this medication and for 6 months after the last dose. This medication can cause infertility. Talk to your care team if you are concerned about your fertility. What side effects may I notice from receiving this medication? Side effects that you should report to your care team as soon as possible: Allergic reactions--skin rash, itching, hives, swelling of the face, lips, tongue, or throat Bleeding--bloody or black, tar-like stools, vomiting blood or brown material that looks like coffee grounds, red or dark brown urine, small red or purple spots on skin, unusual bruising or bleeding Blood clot--pain, swelling,  or warmth in the leg, shortness of breath, chest pain Heart attack--pain or tightness in the chest, shoulders, arms, or jaw, nausea, shortness of breath, cold or clammy skin, feeling faint or lightheaded Heart failure--shortness of breath, swelling of the ankles, feet, or hands, sudden weight gain, unusual weakness or fatigue Increase in blood pressure Infection--fever, chills, cough, sore throat, wounds that don't heal, pain or trouble when passing urine, general feeling of discomfort or being unwell Infusion reactions--chest pain, shortness of breath or trouble breathing, feeling faint or lightheaded Kidney injury--decrease in the amount of urine, swelling of  the ankles, hands, or feet Stomach pain that is severe, does not go away, or gets worse Stroke--sudden numbness or weakness of the face, arm, or leg, trouble speaking, confusion, trouble walking, loss of balance or coordination, dizziness, severe headache, change in vision Sudden and severe headache, confusion, change in vision, seizures, which may be signs of posterior reversible encephalopathy syndrome (PRES) Side effects that usually do not require medical attention (report to your care team if they continue or are bothersome): Back pain Change in taste Diarrhea Dry skin Increased tears Nosebleed This list may not describe all possible side effects. Call your doctor for medical advice about side effects. You may report side effects to FDA at 1-800-FDA-1088. Where should I keep my medication? This medication is given in a hospital or clinic. It will not be stored at home. NOTE: This sheet is a summary. It may not cover all possible information. If you have questions about this medicine, talk to your doctor, pharmacist, or health care provider.  2023 Elsevier/Gold Standard (2022-03-09 00:00:00)  Fluorouracil Injection What is this medication? FLUOROURACIL (flure oh YOOR a sil) treats some types of cancer. It works by slowing down the growth of cancer cells. This medicine may be used for other purposes; ask your health care provider or pharmacist if you have questions. COMMON BRAND NAME(S): Adrucil What should I tell my care team before I take this medication? They need to know if you have any of these conditions: Blood disorders Dihydropyrimidine dehydrogenase (DPD) deficiency Infection, such as chickenpox, cold sores, herpes Kidney disease Liver disease Poor nutrition Recent or ongoing radiation therapy An unusual or allergic reaction to fluorouracil, other medications, foods, dyes, or preservatives If you or your partner are pregnant or trying to get pregnant Breast-feeding How  should I use this medication? This medication is injected into a vein. It is administered by your care team in a hospital or clinic setting. Talk to your care team about the use of this medication in children. Special care may be needed. Overdosage: If you think you have taken too much of this medicine contact a poison control center or emergency room at once. NOTE: This medicine is only for you. Do not share this medicine with others. What if I miss a dose? Keep appointments for follow-up doses. It is important not to miss your dose. Call your care team if you are unable to keep an appointment. What may interact with this medication? Do not take this medication with any of the following: Live virus vaccines This medication may also interact with the following: Medications that treat or prevent blood clots, such as warfarin, enoxaparin, dalteparin This list may not describe all possible interactions. Give your health care provider a list of all the medicines, herbs, non-prescription drugs, or dietary supplements you use. Also tell them if you smoke, drink alcohol, or use illegal drugs. Some items may interact with your medicine. What should  I watch for while using this medication? Your condition will be monitored carefully while you are receiving this medication. This medication may make you feel generally unwell. This is not uncommon as chemotherapy can affect healthy cells as well as cancer cells. Report any side effects. Continue your course of treatment even though you feel ill unless your care team tells you to stop. In some cases, you may be given additional medications to help with side effects. Follow all directions for their use. This medication may increase your risk of getting an infection. Call your care team for advice if you get a fever, chills, sore throat, or other symptoms of a cold or flu. Do not treat yourself. Try to avoid being around people who are sick. This medication may  increase your risk to bruise or bleed. Call your care team if you notice any unusual bleeding. Be careful brushing or flossing your teeth or using a toothpick because you may get an infection or bleed more easily. If you have any dental work done, tell your dentist you are receiving this medication. Avoid taking medications that contain aspirin, acetaminophen, ibuprofen, naproxen, or ketoprofen unless instructed by your care team. These medications may hide a fever. Do not treat diarrhea with over the counter products. Contact your care team if you have diarrhea that lasts more than 2 days or if it is severe and watery. This medication can make you more sensitive to the sun. Keep out of the sun. If you cannot avoid being in the sun, wear protective clothing and sunscreen. Do not use sun lamps, tanning beds, or tanning booths. Talk to your care team if you or your partner wish to become pregnant or think you might be pregnant. This medication can cause serious birth defects if taken during pregnancy and for 3 months after the last dose. A reliable form of contraception is recommended while taking this medication and for 3 months after the last dose. Talk to your care team about effective forms of contraception. Do not father a child while taking this medication and for 3 months after the last dose. Use a condom while having sex during this time period. Do not breastfeed while taking this medication. This medication may cause infertility. Talk to your care team if you are concerned about your fertility. What side effects may I notice from receiving this medication? Side effects that you should report to your care team as soon as possible: Allergic reactions--skin rash, itching, hives, swelling of the face, lips, tongue, or throat Heart attack--pain or tightness in the chest, shoulders, arms, or jaw, nausea, shortness of breath, cold or clammy skin, feeling faint or lightheaded Heart failure--shortness of  breath, swelling of the ankles, feet, or hands, sudden weight gain, unusual weakness or fatigue Heart rhythm changes--fast or irregular heartbeat, dizziness, feeling faint or lightheaded, chest pain, trouble breathing High ammonia level--unusual weakness or fatigue, confusion, loss of appetite, nausea, vomiting, seizures Infection--fever, chills, cough, sore throat, wounds that don't heal, pain or trouble when passing urine, general feeling of discomfort or being unwell Low red blood cell level--unusual weakness or fatigue, dizziness, headache, trouble breathing Pain, tingling, or numbness in the hands or feet, muscle weakness, change in vision, confusion or trouble speaking, loss of balance or coordination, trouble walking, seizures Redness, swelling, and blistering of the skin over hands and feet Severe or prolonged diarrhea Unusual bruising or bleeding Side effects that usually do not require medical attention (report to your care team if they continue or  are bothersome): Dry skin Headache Increased tears Nausea Pain, redness, or swelling with sores inside the mouth or throat Sensitivity to light Vomiting This list may not describe all possible side effects. Call your doctor for medical advice about side effects. You may report side effects to FDA at 1-800-FDA-1088. Where should I keep my medication? This medication is given in a hospital or clinic. It will not be stored at home. NOTE: This sheet is a summary. It may not cover all possible information. If you have questions about this medicine, talk to your doctor, pharmacist, or health care provider.  2023 Elsevier/Gold Standard (2022-03-06 00:00:00)  The chemotherapy medication bag should finish at 46 hours, 96 hours, or 7 days. For example, if your pump is scheduled for 46 hours and it was put on at 4:00 p.m., it should finish at 2:00 p.m. the day it is scheduled to come off regardless of your appointment time.     Estimated time to  finish at 10:30 a.m. on Thursday 10/25/2022.   If the display on your pump reads "Low Volume" and it is beeping, take the batteries out of the pump and come to the cancer center for it to be taken off.   If the pump alarms go off prior to the pump reading "Low Volume" then call [phone] and someone can assist you.  If the plunger comes out and the chemotherapy medication is leaking out, please use your home chemo spill kit to clean up the spill. Do NOT use paper towels or other household products.  If you have problems or questions regarding your pump, please call either [phone] (24 hours a day) or the cancer center Monday-Friday 8:00 a.m.- 4:30 p.m. at the clinic number and we will assist you. If you are unable to get assistance, then go to the nearest Emergency Department and ask the staff to contact the IV team for assistance.

## 2022-10-23 NOTE — Progress Notes (Signed)
[facility] OFFICE PROGRESS NOTE   Diagnosis: Colon cancer  INTERVAL HISTORY:   Kathy Robinson returns as scheduled.  She completed another cycle of 5-FU/bevacizumab 10/02/2022.  She denies nausea/vomiting.  No mouth sores.  No diarrhea.  No abdominal pain.  She denies bleeding.  Occasional bilateral foot edema.  Persistent neuropathy symptoms.  Objective:  Vital signs in last 24 hours:  Blood pressure 136/72, pulse 82, temperature 98.7 F (37.1 C), temperature source Oral, resp. rate 16, weight 154 lb 1.6 oz (69.9 kg), last menstrual period 11/19/1996, SpO2 100 %.    HEENT: No thrush or ulcers. Resp: Lungs clear bilaterally. Cardio: Regular rate and rhythm. GI: Abdomen soft and nontender.  No hepatosplenomegaly. Vascular: No leg edema. Skin: Palms without erythema. Port-A-Cath without erythema.   Lab Results:  Lab Results  Component Value Date   WBC 5.0 10/02/2022   HGB 12.6 10/02/2022   HCT 37.8 10/02/2022   MCV 106.8 (H) 10/02/2022   PLT 189 10/02/2022   NEUTROABS 2.5 10/02/2022    Imaging:  No results found.  Medications: I have reviewed the patient's current medications.  Assessment/Plan: Adenocarcinoma the left colon, stage IIIc (pT4b,pN2a), status post a left colectomy 05/30/2020, MSS, TMB 13, BRAF V600E Tumor invades the visceral peritoneum, lymphovascular and perineural invasion present, for tumor deposits, 7/13 lymph nodes, negative resection margins, no loss of mismatch repair protein expression CT abdomen/pelvis 05/26/2020-long segment of masslike thickening of the descending colon with associated high-grade colonic obstruction, small colonic wall defect with evidence of a focally contained microperforation, retroperitoneal adenopathy, indeterminate small hypodense liver lesions Elevated preoperative CEA PET 06/22/2020-hypermetabolic liver metastases, periportal and periaortic adenopathy, hypermetabolic mediastinal and left supraclavicular  nodes Cycle 1 FOLFOX 07/05/20 Cycle 2 FOLFOXIRI (dose-reduced 5FU, no bolus) and Avastin 07/19/20  Cycle 3 FOLFOXIRI (Dose reduced 5-FU, no bolus)/Avastin 08/02/2020 Cycle 4 FOLFOXIRI/Avastin 08/16/2020 (Udenyca added) Cycle 5 FOLFOXIRI/Avastin 08/30/2020 CTs 09/09/2020-diminished size of lymph nodes in the chest, abdomen, and pelvis.  Decreased hepatic metastases.  No new evidence of disease progression, fat density lesion surrounding the left hemicolectomy Cycle 6 FOLFOXIRI/Avastin 09/13/2020 Cycle 7 FOLFOX/Avastin 09/27/2020 (irinotecan held) Cycle 8 FOLFOXIRI/Avastin 10/18/2020  Cycle 9 FOLFOXIRI/Avastin 11/01/2020 Cycle 10 FOLFOXIRI/Avastin 11/22/2020 (oxaliplatin held, irinotecan and 5-FU dose reduced) Cycle 11 FOLFOXIRI/Avastin 12/12/2020 (oxaliplatin held) CTs 12/19/2020-decrease in size of liver metastases, no evidence of disease progression Cycle 12 FOLFOXIRI/Avastin 01/04/2021 (oxaliplatin held) Cycle 13 FOLFOXIRI/Avastin 01/24/2021 (oxaliplatin held) Cycle 14 FOLFOXIRI/Avastin 02/14/2021 (oxaliplatin held) Cycle 15 FOLFOXIRI/Avastin 03/07/2021 (oxaliplatin held) Cycle 16 FOLFOXIRI/Avastin 03/29/2021 (oxaliplatin held) CTs 04/14/2021- decreased size of liver metastases.  No new or progressive findings. Cycle 17 FOLFOXIRI/Avastin 04/19/2021 (oxaliplatin held) Cycle 18 FOLFOXIRI/Avastin 05/09/2021 (oxaliplatin held) Cycle 19 FOLFOXIRI/Avastin 05/30/2021 (oxaliplatin held) Cycle 20 FOLFOXIRI/Avastin 06/20/2021 (oxaliplatin held) Cycle 21 FOLFOXIRI/Avastin 07/11/2021 (oxaliplatin held) CTs 07/28/2021-unchanged subcentimeter liver lesions, no evidence of new metastatic disease Cycle 22 5-FU/ Avastin 08/01/2021 Cycle 23 5-FU/Avastin 08/22/2021 Cycle 24 5-FU/Avastin 09/12/2021 Cycle 25 5-FU/Avastin 10/03/2021 Cycle 26 5-FU/Avastin 10/24/2021 Cycle 27 5-FU/Avastin 11/15/2021 Cycle 28 5-FU/Avastin 12/05/2021 CTs 12/22/2021-grossly similar subcentimeter low-attenuation lesions in the liver compatible with treated  metastasis.  No evidence for new metastatic disease in the chest, abdomen or pelvis. Cycle 29 5-FU/Avastin 12/26/2021 Cycle 30 5-FU/Avastin 01/16/2022 Cycle 41 5-FU/Avastin 02/06/2022 Cycle 42 5-FU/Avastin 02/27/2022 Cycle 43 5-FU/Avastin 03/20/2022 Cycle 44 5-FU/Avastin 04/10/2022 CT 04/28/2022-interval development of a 3 mm nodule within the left lower lobe and 3 mm nodule within the right lower lobe, nonspecific.  Unchanged subcentimeter low-attenuation lesions in the liver compatible with  treated metastasis. Cycle 45 5-FU/Avastin 05/01/2022 Cycle 46 5-FU/Avastin 05/29/2022 Cycle 47 5-FU/Avastin 06/19/2022 Cycle 48 5-FU/Avastin 07/10/2022 Cycle 49 5-FU/Avastin 07/31/2022 Cycle 50 5-FU/Avastin 08/21/2022 CTs 09/07/2022-no evidence of metastatic disease, tiny lung nodules described as new on previous exam have resolved, no hepatic metastases are appreciated Cycle 51 5-FU/Avastin 09/11/2022 Cycle 52 5-FU/Avastin 10/02/2022 Cycle 53 5-FU/Avastin 10/23/2022     Atrial fibrillation with rapid ventricular response 05/26/2020 Mild neutropenia following cycle 3 FOLFOXIRI, Udenyca added with cycle 4 Hypokalemia secondary to diarrhea-potassium supplementation starting 09/13/2020 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  She will complete another cycle of 5-FU/Avastin today.  She continues to tolerate chemotherapy well.  There is no clinical evidence of disease progression.  CBC and chemistry panel reviewed.  Labs adequate to proceed as above.  Urine with trace protein, stable.  She will return for follow-up as scheduled 11/20/2022.   Ned Card ANP/GNP-BC   10/23/2022  10:49 AM

## 2022-10-25 ENCOUNTER — Inpatient Hospital Stay: Payer: Medicare Other

## 2022-10-25 VITALS — BP 138/83 | HR 86 | Temp 98.1°F | Resp 17

## 2022-10-25 DIAGNOSIS — C185 Malignant neoplasm of splenic flexure: Secondary | ICD-10-CM

## 2022-10-25 DIAGNOSIS — Z5112 Encounter for antineoplastic immunotherapy: Secondary | ICD-10-CM | POA: Diagnosis not present

## 2022-10-25 DIAGNOSIS — C787 Secondary malignant neoplasm of liver and intrahepatic bile duct: Secondary | ICD-10-CM | POA: Diagnosis not present

## 2022-10-25 DIAGNOSIS — I4891 Unspecified atrial fibrillation: Secondary | ICD-10-CM | POA: Diagnosis not present

## 2022-10-25 DIAGNOSIS — Z5111 Encounter for antineoplastic chemotherapy: Secondary | ICD-10-CM | POA: Diagnosis not present

## 2022-10-25 DIAGNOSIS — G629 Polyneuropathy, unspecified: Secondary | ICD-10-CM | POA: Diagnosis not present

## 2022-10-25 DIAGNOSIS — R918 Other nonspecific abnormal finding of lung field: Secondary | ICD-10-CM | POA: Diagnosis not present

## 2022-10-25 DIAGNOSIS — D709 Neutropenia, unspecified: Secondary | ICD-10-CM | POA: Diagnosis not present

## 2022-10-25 DIAGNOSIS — C186 Malignant neoplasm of descending colon: Secondary | ICD-10-CM | POA: Diagnosis not present

## 2022-10-25 DIAGNOSIS — R609 Edema, unspecified: Secondary | ICD-10-CM | POA: Diagnosis not present

## 2022-10-25 MED ORDER — HEPARIN SOD (PORK) LOCK FLUSH 100 UNIT/ML IV SOLN
500.0000 [IU] | Freq: Once | INTRAVENOUS | Status: AC | PRN
  Administered 2022-10-25: 500 [IU]

## 2022-10-25 MED ORDER — SODIUM CHLORIDE 0.9% FLUSH
10.0000 mL | INTRAVENOUS | Status: DC | PRN
  Administered 2022-10-25: 10 mL

## 2022-10-25 NOTE — Patient Instructions (Signed)

## 2022-11-19 ENCOUNTER — Other Ambulatory Visit: Payer: Self-pay | Admitting: Oncology

## 2022-11-20 ENCOUNTER — Other Ambulatory Visit (HOSPITAL_COMMUNITY): Payer: Self-pay

## 2022-11-20 ENCOUNTER — Inpatient Hospital Stay: Payer: Medicare Other | Attending: Oncology

## 2022-11-20 ENCOUNTER — Inpatient Hospital Stay: Payer: Medicare Other

## 2022-11-20 ENCOUNTER — Inpatient Hospital Stay: Payer: Medicare Other | Admitting: Oncology

## 2022-11-20 ENCOUNTER — Encounter: Payer: Self-pay | Admitting: *Deleted

## 2022-11-20 VITALS — BP 164/76 | HR 64

## 2022-11-20 VITALS — BP 157/72 | HR 66 | Temp 98.1°F | Resp 18 | Ht 66.0 in | Wt 157.0 lb

## 2022-11-20 DIAGNOSIS — C185 Malignant neoplasm of splenic flexure: Secondary | ICD-10-CM | POA: Diagnosis not present

## 2022-11-20 DIAGNOSIS — C186 Malignant neoplasm of descending colon: Secondary | ICD-10-CM | POA: Insufficient documentation

## 2022-11-20 DIAGNOSIS — R197 Diarrhea, unspecified: Secondary | ICD-10-CM | POA: Diagnosis not present

## 2022-11-20 DIAGNOSIS — C787 Secondary malignant neoplasm of liver and intrahepatic bile duct: Secondary | ICD-10-CM | POA: Insufficient documentation

## 2022-11-20 DIAGNOSIS — Z5189 Encounter for other specified aftercare: Secondary | ICD-10-CM | POA: Insufficient documentation

## 2022-11-20 DIAGNOSIS — I4891 Unspecified atrial fibrillation: Secondary | ICD-10-CM | POA: Insufficient documentation

## 2022-11-20 DIAGNOSIS — Z5111 Encounter for antineoplastic chemotherapy: Secondary | ICD-10-CM | POA: Diagnosis not present

## 2022-11-20 DIAGNOSIS — C778 Secondary and unspecified malignant neoplasm of lymph nodes of multiple regions: Secondary | ICD-10-CM | POA: Insufficient documentation

## 2022-11-20 DIAGNOSIS — E876 Hypokalemia: Secondary | ICD-10-CM | POA: Diagnosis not present

## 2022-11-20 LAB — CMP (CANCER CENTER ONLY)
ALT: 16 U/L (ref 0–44)
AST: 23 U/L (ref 15–41)
Albumin: 4 g/dL (ref 3.5–5.0)
Alkaline Phosphatase: 58 U/L (ref 38–126)
Anion gap: 5 (ref 5–15)
BUN: 25 mg/dL — ABNORMAL HIGH (ref 8–23)
CO2: 26 mmol/L (ref 22–32)
Calcium: 9.2 mg/dL (ref 8.9–10.3)
Chloride: 105 mmol/L (ref 98–111)
Creatinine: 0.91 mg/dL (ref 0.44–1.00)
GFR, Estimated: >60 mL/min (ref >60)
Glucose, Bld: 79 mg/dL (ref 70–99)
Potassium: 4.5 mmol/L (ref 3.5–5.1)
Sodium: 136 mmol/L (ref 135–145)
Total Bilirubin: 0.4 mg/dL (ref 0.3–1.2)
Total Protein: 6.6 g/dL (ref 6.5–8.1)

## 2022-11-20 LAB — CBC WITH DIFFERENTIAL (CANCER CENTER ONLY)
Abs Immature Granulocytes: 0.01 10*3/uL (ref 0.00–0.07)
Basophils Absolute: 0 10*3/uL (ref 0.0–0.1)
Basophils Relative: 1 %
Eosinophils Absolute: 0.3 10*3/uL (ref 0.0–0.5)
Eosinophils Relative: 6 %
HCT: 38.8 % (ref 36.0–46.0)
Hemoglobin: 12.8 g/dL (ref 12.0–15.0)
Immature Granulocytes: 0 %
Lymphocytes Relative: 37 %
Lymphs Abs: 1.9 10*3/uL (ref 0.7–4.0)
MCH: 35.3 pg — ABNORMAL HIGH (ref 26.0–34.0)
MCHC: 33 g/dL (ref 30.0–36.0)
MCV: 106.9 fL — ABNORMAL HIGH (ref 80.0–100.0)
Monocytes Absolute: 0.5 10*3/uL (ref 0.1–1.0)
Monocytes Relative: 10 %
Neutro Abs: 2.4 10*3/uL (ref 1.7–7.7)
Neutrophils Relative %: 46 %
Platelet Count: 190 10*3/uL (ref 150–400)
RBC: 3.63 MIL/uL — ABNORMAL LOW (ref 3.87–5.11)
RDW: 13.4 % (ref 11.5–15.5)
WBC Count: 5.1 10*3/uL (ref 4.0–10.5)
nRBC: 0 % (ref 0.0–0.2)

## 2022-11-20 MED ORDER — SODIUM CHLORIDE 0.9 % IV SOLN: Freq: Once | INTRAVENOUS | Status: AC

## 2022-11-20 MED ORDER — SODIUM CHLORIDE 0.9% FLUSH
10.0000 mL | INTRAVENOUS | Status: DC | PRN
  Administered 2022-11-20: 10 mL

## 2022-11-20 MED ORDER — POTASSIUM CHLORIDE ER 10 MEQ PO CPCR
ORAL_CAPSULE | Freq: Two times a day (BID) | ORAL | 2 refills | Status: DC
  Filled 2022-11-20: qty 60, 30d supply, fill #0
  Filled 2022-12-17: qty 60, 30d supply, fill #1
  Filled 2023-01-25: qty 60, 30d supply, fill #2

## 2022-11-20 MED ORDER — SODIUM CHLORIDE 0.9 % IV SOLN
7.5000 mg/kg | Freq: Once | INTRAVENOUS | Status: AC
  Administered 2022-11-20: 500 mg via INTRAVENOUS
  Filled 2022-11-20: qty 16

## 2022-11-20 MED ORDER — SODIUM CHLORIDE 0.9 % IV SOLN
1600.0000 mg/m2 | INTRAVENOUS | Status: DC
  Administered 2022-11-20: 2900 mg via INTRAVENOUS
  Filled 2022-11-20: qty 58

## 2022-11-20 NOTE — Progress Notes (Signed)
[facility] OFFICE PROGRESS NOTE   Diagnosis: Colon cancer  INTERVAL HISTORY:   Kathy Robinson returns as scheduled.  She completed on cycle of 5-FU/bevacizumab on 10/23/2022.  No nausea/vomiting or diarrhea.  She reports intermittent discomfort at the upper and right lower molars for the past few weeks.  He is scheduled to see her dentist next week.  Objective:  Vital signs in last 24 hours:  Blood pressure (!) 157/72, pulse 66, temperature 98.1 F (36.7 C), temperature source Oral, resp. rate 18, height _0  (1.676 m), weight 157 lb (71.2 kg), last menstrual period 11/19/1996, SpO2 100 %.    HEENT: No thrush or ulcers, periodontal disease?  At the upper and lower molar. Resp: Lungs clear bilaterally Cardio: Regular rate and rhythm GI: No hepatosplenomegaly Vascular: No leg edema    Portacath/PICC-without erythema  Lab Results:  Lab Results  Component Value Date   WBC 5.1 11/20/2022   HGB 12.8 11/20/2022   HCT 38.8 11/20/2022   MCV 106.9 (H) 11/20/2022   PLT 190 11/20/2022   NEUTROABS 2.4 11/20/2022    CMP  Lab Results  Component Value Date   NA 136 11/20/2022   K 4.5 11/20/2022   CL 105 11/20/2022   CO2 26 11/20/2022   GLUCOSE 79 11/20/2022   BUN 25 (H) 11/20/2022   CREATININE 0.91 11/20/2022   CALCIUM 9.2 11/20/2022   PROT 6.6 11/20/2022   ALBUMIN 4.0 11/20/2022   AST 23 11/20/2022   ALT 16 11/20/2022   ALKPHOS 58 11/20/2022   BILITOT 0.4 11/20/2022   GFRNONAA >60 11/20/2022   GFRAA >60 08/16/2020    Lab Results  Component Value Date   CEA1 1.16 03/29/2021   CEA 1.57 10/23/2022    Lab Results  Component Value Date   INR 1.1 05/30/2020   LABPROT 13.5 05/30/2020    Imaging:  No results found.  Medications: I have reviewed the patient's current medications.   Assessment/Plan: Adenocarcinoma the left colon, stage IIIc (pT4b,pN2a), status post a left colectomy 05/30/2020, MSS, TMB 13, BRAF V600E Tumor invades the visceral  peritoneum, lymphovascular and perineural invasion present, for tumor deposits, 7/13 lymph nodes, negative resection margins, no loss of mismatch repair protein expression CT abdomen/pelvis 05/26/2020-long segment of masslike thickening of the descending colon with associated high-grade colonic obstruction, small colonic wall defect with evidence of a focally contained microperforation, retroperitoneal adenopathy, indeterminate small hypodense liver lesions Elevated preoperative CEA PET 06/22/2020-hypermetabolic liver metastases, periportal and periaortic adenopathy, hypermetabolic mediastinal and left supraclavicular nodes Cycle 1 FOLFOX 07/05/20 Cycle 2 FOLFOXIRI (dose-reduced 5FU, no bolus) and Avastin 07/19/20  Cycle 3 FOLFOXIRI (Dose reduced 5-FU, no bolus)/Avastin 08/02/2020 Cycle 4 FOLFOXIRI/Avastin 08/16/2020 (Udenyca added) Cycle 5 FOLFOXIRI/Avastin 08/30/2020 CTs 09/09/2020-diminished size of lymph nodes in the chest, abdomen, and pelvis.  Decreased hepatic metastases.  No new evidence of disease progression, fat density lesion surrounding the left hemicolectomy Cycle 6 FOLFOXIRI/Avastin 09/13/2020 Cycle 7 FOLFOX/Avastin 09/27/2020 (irinotecan held) Cycle 8 FOLFOXIRI/Avastin 10/18/2020  Cycle 9 FOLFOXIRI/Avastin 11/01/2020 Cycle 10 FOLFOXIRI/Avastin 11/22/2020 (oxaliplatin held, irinotecan and 5-FU dose reduced) Cycle 11 FOLFOXIRI/Avastin 12/12/2020 (oxaliplatin held) CTs 12/19/2020-decrease in size of liver metastases, no evidence of disease progression Cycle 12 FOLFOXIRI/Avastin 01/04/2021 (oxaliplatin held) Cycle 13 FOLFOXIRI/Avastin 01/24/2021 (oxaliplatin held) Cycle 14 FOLFOXIRI/Avastin 02/14/2021 (oxaliplatin held) Cycle 15 FOLFOXIRI/Avastin 03/07/2021 (oxaliplatin held) Cycle 16 FOLFOXIRI/Avastin 03/29/2021 (oxaliplatin held) CTs 04/14/2021- decreased size of liver metastases.  No new or progressive findings. Cycle 17 FOLFOXIRI/Avastin 04/19/2021 (oxaliplatin held) Cycle 18 FOLFOXIRI/Avastin  05/09/2021 (oxaliplatin held)  Cycle 19 FOLFOXIRI/Avastin 05/30/2021 (oxaliplatin held) Cycle 20 FOLFOXIRI/Avastin 06/20/2021 (oxaliplatin held) Cycle 21 FOLFOXIRI/Avastin 07/11/2021 (oxaliplatin held) CTs 07/28/2021-unchanged subcentimeter liver lesions, no evidence of new metastatic disease Cycle 22 5-FU/ Avastin 08/01/2021 Cycle 23 5-FU/Avastin 08/22/2021 Cycle 24 5-FU/Avastin 09/12/2021 Cycle 25 5-FU/Avastin 10/03/2021 Cycle 26 5-FU/Avastin 10/24/2021 Cycle 27 5-FU/Avastin 11/15/2021 Cycle 28 5-FU/Avastin 12/05/2021 CTs 12/22/2021-grossly similar subcentimeter low-attenuation lesions in the liver compatible with treated metastasis.  No evidence for new metastatic disease in the chest, abdomen or pelvis. Cycle 29 5-FU/Avastin 12/26/2021 Cycle 30 5-FU/Avastin 01/16/2022 Cycle 41 5-FU/Avastin 02/06/2022 Cycle 42 5-FU/Avastin 02/27/2022 Cycle 43 5-FU/Avastin 03/20/2022 Cycle 44 5-FU/Avastin 04/10/2022 CT 04/28/2022-interval development of a 3 mm nodule within the left lower lobe and 3 mm nodule within the right lower lobe, nonspecific.  Unchanged subcentimeter low-attenuation lesions in the liver compatible with treated metastasis. Cycle 45 5-FU/Avastin 05/01/2022 Cycle 46 5-FU/Avastin 05/29/2022 Cycle 47 5-FU/Avastin 06/19/2022 Cycle 48 5-FU/Avastin 07/10/2022 Cycle 49 5-FU/Avastin 07/31/2022 Cycle 50 5-FU/Avastin 08/21/2022 CTs 09/07/2022-no evidence of metastatic disease, tiny lung nodules described as new on previous exam have resolved, no hepatic metastases are appreciated Cycle 51 5-FU/Avastin 09/11/2022 Cycle 52 5-FU/Avastin 10/02/2022 Cycle 53 5-FU/Avastin 10/23/2022 Cycle 54 5-FU/Avastin 11/20/2021     Atrial fibrillation with rapid ventricular response 05/26/2020 Mild neutropenia following cycle 3 FOLFOXIRI, Udenyca added with cycle 4 Hypokalemia secondary to diarrhea-potassium supplementation starting 09/13/2020 Oxaliplatin neuropathy-moderate loss of vibratory sense on exam 11/22/2020, oxaliplatin held  with cycle 10, cycle 11, 12, 13 chemotherapy, improved      Disposition: [name] appears stable.  She continues to tolerate the 5-FU/Avastin well.  She will complete another cycle today.  She will return for an office visit and chemotherapy in 3 weeks.  She will then be switched to an every 4-week chemotherapy schedule.  She will follow-up with her dentist regarding the tooth pain.  I asked her to have her dentist contact me if tooth extractions or other surgery is planned.  [name], MD  11/20/2022  11:29 AM

## 2022-11-20 NOTE — Progress Notes (Signed)
Patient seen by Dr. Benay Spice today  Vitals are within treatment parameters.No intervention needed for BP 157/72   Labs reviewed by Dr. Benay Spice and are within treatment parameters.  Per physician team, patient is ready for treatment and there are NO modifications to the treatment plan.

## 2022-11-20 NOTE — Patient Instructions (Addendum)
Kathy Robinson   Discharge Instructions: Thank you for choosing [facility] to provide your oncology and hematology care.   If you have a lab appointment with the [facility], please go directly to the [facility] and check in at the registration area.   Wear comfortable clothing and clothing appropriate for easy access to any Portacath or PICC line.   We strive to give you quality time with your provider. You may need to reschedule your appointment if you arrive late (15 or more minutes).  Arriving late affects you and other patients whose appointments are after yours.  Also, if you miss three or more appointments without notifying the office, you may be dismissed from the clinic at the provider's discretion.      For prescription refill requests, have your pharmacy contact our office and allow 72 hours for refills to be completed.    Today you received the following chemotherapy and/or immunotherapy agents Bevacizumab-bvzr (ZIRABEV) & Flourouracil (ADRUCIL).      To help prevent nausea and vomiting after your treatment, we encourage you to take your nausea medication as directed.  BELOW ARE SYMPTOMS THAT SHOULD BE REPORTED IMMEDIATELY: *FEVER GREATER THAN 100.4 F (38 C) OR HIGHER *CHILLS OR SWEATING *NAUSEA AND VOMITING THAT IS NOT CONTROLLED WITH YOUR NAUSEA MEDICATION *UNUSUAL SHORTNESS OF BREATH *UNUSUAL BRUISING OR BLEEDING *URINARY PROBLEMS (pain or burning when urinating, or frequent urination) *BOWEL PROBLEMS (unusual diarrhea, constipation, pain near the anus) TENDERNESS IN MOUTH AND THROAT WITH OR WITHOUT PRESENCE OF ULCERS (sore throat, sores in mouth, or a toothache) UNUSUAL RASH, SWELLING OR PAIN  UNUSUAL VAGINAL DISCHARGE OR ITCHING   Items with * indicate a potential emergency and should be followed up as soon as possible or go to the Emergency Department if any problems should occur.  Please show the CHEMOTHERAPY ALERT CARD or  IMMUNOTHERAPY ALERT CARD at check-in to the Emergency Department and triage nurse.  Should you have questions after your visit or need to cancel or reschedule your appointment, please contact [facility]  Dept: [phone]  and follow the prompts.  Office hours are 8:00 a.m. to 4:30 p.m. Monday - Friday. Please note that voicemails left after 4:00 p.m. may not be returned until the following business day.  We are closed weekends and major holidays. You have access to a nurse at all times for urgent questions. Please call the main number to the clinic Dept: [phone] and follow the prompts.   For any non-urgent questions, you may also contact your provider using MyChart. We now offer e-Visits for anyone 64 and older to request care online for non-urgent symptoms. For details visit [email or web address].   Also download the MyChart app! Go to the app store, search "MyChart", open the app, select [facility], and log in with your MyChart username and password.  Masks are optional in the cancer centers. If you would like for your care team to wear a mask while they are taking care of you, please let them know. You may have one support person who is at least 81 years old accompany you for your appointments.  Bevacizumab Injection What is this medication? BEVACIZUMAB (be va SIZ yoo mab) treats some types of cancer. It works by blocking a protein that causes cancer cells to grow and multiply. This helps to slow or stop the spread of cancer cells. It is a monoclonal antibody. This medicine may be used for other purposes; ask  your health care provider or pharmacist if you have questions. COMMON BRAND NAME(S): Alymsys, Avastin, MVASI, [name] What should I tell my care team before I take this medication? They need to know if you have any of these conditions: Blood clots Coughing up blood Having or recent surgery Heart failure High blood pressure History of a connection  between 2 or more body parts that do not usually connect (fistula) History of a tear in your stomach or intestines Protein in your urine An unusual or allergic reaction to bevacizumab, other medications, foods, dyes, or preservatives Pregnant or trying to get pregnant Breast-feeding How should I use this medication? This medication is injected into a vein. It is given by your care team in a hospital or clinic setting. Talk to your care team the use of this medication in children. Special care may be needed. Overdosage: If you think you have taken too much of this medicine contact a poison control center or emergency room at once. NOTE: This medicine is only for you. Do not share this medicine with others. What if I miss a dose? Keep appointments for follow-up doses. It is important not to miss your dose. Call your care team if you are unable to keep an appointment. What may interact with this medication? Interactions are not expected. This list may not describe all possible interactions. Give your health care provider a list of all the medicines, herbs, non-prescription drugs, or dietary supplements you use. Also tell them if you smoke, drink alcohol, or use illegal drugs. Some items may interact with your medicine. What should I watch for while using this medication? Your condition will be monitored carefully while you are receiving this medication. You may need blood work while taking this medication. This medication may make you feel generally unwell. This is not uncommon as chemotherapy can affect healthy cells as well as cancer cells. Report any side effects. Continue your course of treatment even though you feel ill unless your care team tells you to stop. This medication may increase your risk to bruise or bleed. Call your care team if you notice any unusual bleeding. Before having surgery, talk to your care team to make sure it is ok. This medication can increase the risk of poor healing  of your surgical site or wound. You will need to stop this medication for 28 days before surgery. After surgery, wait at least 28 days before restarting this medication. Make sure the surgical site or wound is healed enough before restarting this medication. Talk to your care team if questions. Talk to your care team if you may be pregnant. Serious birth defects can occur if you take this medication during pregnancy and for 6 months after the last dose. Contraception is recommended while taking this medication and for 6 months after the last dose. Your care team can help you find the option that works for you. Do not breastfeed while taking this medication and for 6 months after the last dose. This medication can cause infertility. Talk to your care team if you are concerned about your fertility. What side effects may I notice from receiving this medication? Side effects that you should report to your care team as soon as possible: Allergic reactions--skin rash, itching, hives, swelling of the face, lips, tongue, or throat Bleeding--bloody or black, tar-like stools, vomiting blood or brown material that looks like coffee grounds, red or dark brown urine, small red or purple spots on skin, unusual bruising or bleeding Blood clot--pain, swelling,  or warmth in the leg, shortness of breath, chest pain Heart attack--pain or tightness in the chest, shoulders, arms, or jaw, nausea, shortness of breath, cold or clammy skin, feeling faint or lightheaded Heart failure--shortness of breath, swelling of the ankles, feet, or hands, sudden weight gain, unusual weakness or fatigue Increase in blood pressure Infection--fever, chills, cough, sore throat, wounds that don't heal, pain or trouble when passing urine, general feeling of discomfort or being unwell Infusion reactions--chest pain, shortness of breath or trouble breathing, feeling faint or lightheaded Kidney injury--decrease in the amount of urine, swelling of  the ankles, hands, or feet Stomach pain that is severe, does not go away, or gets worse Stroke--sudden numbness or weakness of the face, arm, or leg, trouble speaking, confusion, trouble walking, loss of balance or coordination, dizziness, severe headache, change in vision Sudden and severe headache, confusion, change in vision, seizures, which may be signs of posterior reversible encephalopathy syndrome (PRES) Side effects that usually do not require medical attention (report to your care team if they continue or are bothersome): Back pain Change in taste Diarrhea Dry skin Increased tears Nosebleed This list may not describe all possible side effects. Call your doctor for medical advice about side effects. You may report side effects to FDA at 1-800-FDA-1088. Where should I keep my medication? This medication is given in a hospital or clinic. It will not be stored at home. NOTE: This sheet is a summary. It may not cover all possible information. If you have questions about this medicine, talk to your doctor, pharmacist, or health care provider.  2023 Elsevier/Gold Standard (2022-03-09 00:00:00)  Fluorouracil Injection What is this medication? FLUOROURACIL (flure oh YOOR a sil) treats some types of cancer. It works by slowing down the growth of cancer cells. This medicine may be used for other purposes; ask your health care provider or pharmacist if you have questions. COMMON BRAND NAME(S): Adrucil What should I tell my care team before I take this medication? They need to know if you have any of these conditions: Blood disorders Dihydropyrimidine dehydrogenase (DPD) deficiency Infection, such as chickenpox, cold sores, herpes Kidney disease Liver disease Poor nutrition Recent or ongoing radiation therapy An unusual or allergic reaction to fluorouracil, other medications, foods, dyes, or preservatives If you or your partner are pregnant or trying to get pregnant Breast-feeding How  should I use this medication? This medication is injected into a vein. It is administered by your care team in a hospital or clinic setting. Talk to your care team about the use of this medication in children. Special care may be needed. Overdosage: If you think you have taken too much of this medicine contact a poison control center or emergency room at once. NOTE: This medicine is only for you. Do not share this medicine with others. What if I miss a dose? Keep appointments for follow-up doses. It is important not to miss your dose. Call your care team if you are unable to keep an appointment. What may interact with this medication? Do not take this medication with any of the following: Live virus vaccines This medication may also interact with the following: Medications that treat or prevent blood clots, such as warfarin, enoxaparin, dalteparin This list may not describe all possible interactions. Give your health care provider a list of all the medicines, herbs, non-prescription drugs, or dietary supplements you use. Also tell them if you smoke, drink alcohol, or use illegal drugs. Some items may interact with your medicine. What should  I watch for while using this medication? Your condition will be monitored carefully while you are receiving this medication. This medication may make you feel generally unwell. This is not uncommon as chemotherapy can affect healthy cells as well as cancer cells. Report any side effects. Continue your course of treatment even though you feel ill unless your care team tells you to stop. In some cases, you may be given additional medications to help with side effects. Follow all directions for their use. This medication may increase your risk of getting an infection. Call your care team for advice if you get a fever, chills, sore throat, or other symptoms of a cold or flu. Do not treat yourself. Try to avoid being around people who are sick. This medication may  increase your risk to bruise or bleed. Call your care team if you notice any unusual bleeding. Be careful brushing or flossing your teeth or using a toothpick because you may get an infection or bleed more easily. If you have any dental work done, tell your dentist you are receiving this medication. Avoid taking medications that contain aspirin, acetaminophen, ibuprofen, naproxen, or ketoprofen unless instructed by your care team. These medications may hide a fever. Do not treat diarrhea with over the counter products. Contact your care team if you have diarrhea that lasts more than 2 days or if it is severe and watery. This medication can make you more sensitive to the sun. Keep out of the sun. If you cannot avoid being in the sun, wear protective clothing and sunscreen. Do not use sun lamps, tanning beds, or tanning booths. Talk to your care team if you or your partner wish to become pregnant or think you might be pregnant. This medication can cause serious birth defects if taken during pregnancy and for 3 months after the last dose. A reliable form of contraception is recommended while taking this medication and for 3 months after the last dose. Talk to your care team about effective forms of contraception. Do not father a child while taking this medication and for 3 months after the last dose. Use a condom while having sex during this time period. Do not breastfeed while taking this medication. This medication may cause infertility. Talk to your care team if you are concerned about your fertility. What side effects may I notice from receiving this medication? Side effects that you should report to your care team as soon as possible: Allergic reactions--skin rash, itching, hives, swelling of the face, lips, tongue, or throat Heart attack--pain or tightness in the chest, shoulders, arms, or jaw, nausea, shortness of breath, cold or clammy skin, feeling faint or lightheaded Heart failure--shortness of  breath, swelling of the ankles, feet, or hands, sudden weight gain, unusual weakness or fatigue Heart rhythm changes--fast or irregular heartbeat, dizziness, feeling faint or lightheaded, chest pain, trouble breathing High ammonia level--unusual weakness or fatigue, confusion, loss of appetite, nausea, vomiting, seizures Infection--fever, chills, cough, sore throat, wounds that don't heal, pain or trouble when passing urine, general feeling of discomfort or being unwell Low red blood cell level--unusual weakness or fatigue, dizziness, headache, trouble breathing Pain, tingling, or numbness in the hands or feet, muscle weakness, change in vision, confusion or trouble speaking, loss of balance or coordination, trouble walking, seizures Redness, swelling, and blistering of the skin over hands and feet Severe or prolonged diarrhea Unusual bruising or bleeding Side effects that usually do not require medical attention (report to your care team if they continue or  are bothersome): Dry skin Headache Increased tears Nausea Pain, redness, or swelling with sores inside the mouth or throat Sensitivity to light Vomiting This list may not describe all possible side effects. Call your doctor for medical advice about side effects. You may report side effects to FDA at 1-800-FDA-1088. Where should I keep my medication? This medication is given in a hospital or clinic. It will not be stored at home. NOTE: This sheet is a summary. It may not cover all possible information. If you have questions about this medicine, talk to your doctor, pharmacist, or health care provider.  2023 Elsevier/Gold Standard (2022-03-06 00:00:00)  The chemotherapy medication bag should finish at 46 hours, 96 hours, or 7 days. For example, if your pump is scheduled for 46 hours and it was put on at 4:00 p.m., it should finish at 2:00 p.m. the day it is scheduled to come off regardless of your appointment time.     Estimated time to  finish at 11 a.m. on Thursday 11/22/22.   If the display on your pump reads "Low Volume" and it is beeping, take the batteries out of the pump and come to the cancer center for it to be taken off.   If the pump alarms go off prior to the pump reading "Low Volume" then call [phone] and someone can assist you.  If the plunger comes out and the chemotherapy medication is leaking out, please use your home chemo spill kit to clean up the spill. Do NOT use paper towels or other household products.  If you have problems or questions regarding your pump, please call either [phone] (24 hours a day) or the cancer center Monday-Friday 8:00 a.m.- 4:30 p.m. at the clinic number and we will assist you. If you are unable to get assistance, then go to the nearest Emergency Department and ask the staff to contact the IV team for assistance.

## 2022-11-22 ENCOUNTER — Inpatient Hospital Stay: Payer: Medicare Other

## 2022-11-22 VITALS — BP 138/80 | HR 89 | Temp 98.0°F | Resp 18

## 2022-11-22 DIAGNOSIS — Z5111 Encounter for antineoplastic chemotherapy: Secondary | ICD-10-CM | POA: Diagnosis not present

## 2022-11-22 DIAGNOSIS — Z5189 Encounter for other specified aftercare: Secondary | ICD-10-CM | POA: Diagnosis not present

## 2022-11-22 DIAGNOSIS — I4891 Unspecified atrial fibrillation: Secondary | ICD-10-CM | POA: Diagnosis not present

## 2022-11-22 DIAGNOSIS — R197 Diarrhea, unspecified: Secondary | ICD-10-CM | POA: Diagnosis not present

## 2022-11-22 DIAGNOSIS — C787 Secondary malignant neoplasm of liver and intrahepatic bile duct: Secondary | ICD-10-CM | POA: Diagnosis not present

## 2022-11-22 DIAGNOSIS — C778 Secondary and unspecified malignant neoplasm of lymph nodes of multiple regions: Secondary | ICD-10-CM | POA: Diagnosis not present

## 2022-11-22 DIAGNOSIS — E876 Hypokalemia: Secondary | ICD-10-CM | POA: Diagnosis not present

## 2022-11-22 DIAGNOSIS — C186 Malignant neoplasm of descending colon: Secondary | ICD-10-CM | POA: Diagnosis not present

## 2022-11-22 DIAGNOSIS — C185 Malignant neoplasm of splenic flexure: Secondary | ICD-10-CM

## 2022-11-22 MED ORDER — HEPARIN SOD (PORK) LOCK FLUSH 100 UNIT/ML IV SOLN
500.0000 [IU] | Freq: Once | INTRAVENOUS | Status: AC | PRN
  Administered 2022-11-22: 500 [IU]

## 2022-11-22 MED ORDER — SODIUM CHLORIDE 0.9% FLUSH
10.0000 mL | INTRAVENOUS | Status: DC | PRN
  Administered 2022-11-22: 10 mL

## 2022-11-22 NOTE — Patient Instructions (Signed)

## 2022-11-23 ENCOUNTER — Other Ambulatory Visit: Payer: Self-pay

## 2022-12-09 ENCOUNTER — Other Ambulatory Visit: Payer: Self-pay | Admitting: Oncology

## 2022-12-11 ENCOUNTER — Inpatient Hospital Stay: Payer: Medicare Other

## 2022-12-11 ENCOUNTER — Inpatient Hospital Stay (HOSPITAL_BASED_OUTPATIENT_CLINIC_OR_DEPARTMENT_OTHER): Payer: Medicare Other | Admitting: Oncology

## 2022-12-11 VITALS — BP 162/78 | HR 60 | Resp 18

## 2022-12-11 VITALS — BP 153/73 | HR 69 | Temp 98.1°F | Resp 20 | Ht 66.0 in | Wt 156.6 lb

## 2022-12-11 DIAGNOSIS — C185 Malignant neoplasm of splenic flexure: Secondary | ICD-10-CM

## 2022-12-11 DIAGNOSIS — C778 Secondary and unspecified malignant neoplasm of lymph nodes of multiple regions: Secondary | ICD-10-CM | POA: Diagnosis not present

## 2022-12-11 DIAGNOSIS — Z5189 Encounter for other specified aftercare: Secondary | ICD-10-CM | POA: Diagnosis not present

## 2022-12-11 DIAGNOSIS — E876 Hypokalemia: Secondary | ICD-10-CM | POA: Diagnosis not present

## 2022-12-11 DIAGNOSIS — Z5111 Encounter for antineoplastic chemotherapy: Secondary | ICD-10-CM | POA: Diagnosis not present

## 2022-12-11 DIAGNOSIS — C186 Malignant neoplasm of descending colon: Secondary | ICD-10-CM | POA: Diagnosis not present

## 2022-12-11 DIAGNOSIS — C787 Secondary malignant neoplasm of liver and intrahepatic bile duct: Secondary | ICD-10-CM | POA: Diagnosis not present

## 2022-12-11 DIAGNOSIS — I4891 Unspecified atrial fibrillation: Secondary | ICD-10-CM | POA: Diagnosis not present

## 2022-12-11 DIAGNOSIS — R197 Diarrhea, unspecified: Secondary | ICD-10-CM | POA: Diagnosis not present

## 2022-12-11 LAB — CBC WITH DIFFERENTIAL (CANCER CENTER ONLY)
Abs Immature Granulocytes: 0 10*3/uL (ref 0.00–0.07)
Basophils Absolute: 0 10*3/uL (ref 0.0–0.1)
Basophils Relative: 1 %
Eosinophils Absolute: 0.2 10*3/uL (ref 0.0–0.5)
Eosinophils Relative: 6 %
HCT: 38.5 % (ref 36.0–46.0)
Hemoglobin: 12.8 g/dL (ref 12.0–15.0)
Immature Granulocytes: 0 %
Lymphocytes Relative: 38 %
Lymphs Abs: 1.6 10*3/uL (ref 0.7–4.0)
MCH: 35.4 pg — ABNORMAL HIGH (ref 26.0–34.0)
MCHC: 33.2 g/dL (ref 30.0–36.0)
MCV: 106.4 fL — ABNORMAL HIGH (ref 80.0–100.0)
Monocytes Absolute: 0.4 10*3/uL (ref 0.1–1.0)
Monocytes Relative: 10 %
Neutro Abs: 1.9 10*3/uL (ref 1.7–7.7)
Neutrophils Relative %: 45 %
Platelet Count: 186 10*3/uL (ref 150–400)
RBC: 3.62 MIL/uL — ABNORMAL LOW (ref 3.87–5.11)
RDW: 13.5 % (ref 11.5–15.5)
WBC Count: 4.2 10*3/uL (ref 4.0–10.5)
nRBC: 0 % (ref 0.0–0.2)

## 2022-12-11 LAB — CMP (CANCER CENTER ONLY)
ALT: 14 U/L (ref 0–44)
AST: 22 U/L (ref 15–41)
Albumin: 3.9 g/dL (ref 3.5–5.0)
Alkaline Phosphatase: 44 U/L (ref 38–126)
Anion gap: 6 (ref 5–15)
BUN: 24 mg/dL — ABNORMAL HIGH (ref 8–23)
CO2: 28 mmol/L (ref 22–32)
Calcium: 9.6 mg/dL (ref 8.9–10.3)
Chloride: 102 mmol/L (ref 98–111)
Creatinine: 1.07 mg/dL — ABNORMAL HIGH (ref 0.44–1.00)
GFR, Estimated: 53 mL/min — ABNORMAL LOW (ref >60)
Glucose, Bld: 84 mg/dL (ref 70–99)
Potassium: 4.4 mmol/L (ref 3.5–5.1)
Sodium: 136 mmol/L (ref 135–145)
Total Bilirubin: 0.5 mg/dL (ref 0.3–1.2)
Total Protein: 6.6 g/dL (ref 6.5–8.1)

## 2022-12-11 LAB — TOTAL PROTEIN, URINE DIPSTICK

## 2022-12-11 LAB — CEA (ACCESS): CEA (CHCC): 1.22 ng/mL (ref 0.00–5.00)

## 2022-12-11 MED ORDER — SODIUM CHLORIDE 0.9 % IV SOLN
7.5000 mg/kg | Freq: Once | INTRAVENOUS | Status: AC
  Administered 2022-12-11: 500 mg via INTRAVENOUS
  Filled 2022-12-11: qty 16

## 2022-12-11 MED ORDER — SODIUM CHLORIDE 0.9 % IV SOLN
1600.0000 mg/m2 | INTRAVENOUS | Status: DC
  Administered 2022-12-11: 2900 mg via INTRAVENOUS
  Filled 2022-12-11: qty 58

## 2022-12-11 MED ORDER — SODIUM CHLORIDE 0.9 % IV SOLN: Freq: Once | INTRAVENOUS | Status: AC

## 2022-12-11 NOTE — Progress Notes (Deleted)
[facility] OFFICE PROGRESS NOTE   Diagnosis:   INTERVAL HISTORY:   ***  Objective:  Vital signs in last 24 hours:  Blood pressure (!) 153/73, pulse 69, temperature 98.1 F (36.7 C), temperature source Oral, resp. rate 20, height '5\' 6"'$  (1.676 m), weight 156 lb 9.6 oz (71 kg), last menstrual period 11/19/1996, SpO2 100 %.    HEENT: *** Lymphatics: *** Resp: *** Cardio: *** GI: *** Vascular: *** Neuro:***  Skin:***   Portacath/PICC-without erythema  Lab Results:  Lab Results  Component Value Date   WBC 4.2 12/11/2022   HGB 12.8 12/11/2022   HCT 38.5 12/11/2022   MCV 106.4 (H) 12/11/2022   PLT 186 12/11/2022   NEUTROABS 1.9 12/11/2022    CMP  Lab Results  Component Value Date   NA 136 11/20/2022   K 4.5 11/20/2022   CL 105 11/20/2022   CO2 26 11/20/2022   GLUCOSE 79 11/20/2022   BUN 25 (H) 11/20/2022   CREATININE 0.91 11/20/2022   CALCIUM 9.2 11/20/2022   PROT 6.6 11/20/2022   ALBUMIN 4.0 11/20/2022   AST 23 11/20/2022   ALT 16 11/20/2022   ALKPHOS 58 11/20/2022   BILITOT 0.4 11/20/2022   GFRNONAA >60 11/20/2022   GFRAA >60 08/16/2020    Lab Results  Component Value Date   CEA1 1.16 03/29/2021   CEA 1.57 10/23/2022    Lab Results  Component Value Date   INR 1.1 05/30/2020   LABPROT 13.5 05/30/2020    Imaging:  No results found.  Medications: I have reviewed the patient's current medications.   Assessment/Plan: Adenocarcinoma the left colon, stage IIIc (pT4b,pN2a), status post a left colectomy 05/30/2020, MSS, TMB 13, BRAF V600E Tumor invades the visceral peritoneum, lymphovascular and perineural invasion present, for tumor deposits, 7/13 lymph nodes, negative resection margins, no loss of mismatch repair protein expression CT abdomen/pelvis 05/26/2020-long segment of masslike thickening of the descending colon with associated high-grade colonic obstruction, small colonic wall defect with evidence of a focally contained  microperforation, retroperitoneal adenopathy, indeterminate small hypodense liver lesions Elevated preoperative CEA PET 06/22/2020-hypermetabolic liver metastases, periportal and periaortic adenopathy, hypermetabolic mediastinal and left supraclavicular nodes Cycle 1 FOLFOX 07/05/20 Cycle 2 FOLFOXIRI (dose-reduced 5FU, no bolus) and Avastin 07/19/20  Cycle 3 FOLFOXIRI (Dose reduced 5-FU, no bolus)/Avastin 08/02/2020 Cycle 4 FOLFOXIRI/Avastin 08/16/2020 (Udenyca added) Cycle 5 FOLFOXIRI/Avastin 08/30/2020 CTs 09/09/2020-diminished size of lymph nodes in the chest, abdomen, and pelvis.  Decreased hepatic metastases.  No new evidence of disease progression, fat density lesion surrounding the left hemicolectomy Cycle 6 FOLFOXIRI/Avastin 09/13/2020 Cycle 7 FOLFOX/Avastin 09/27/2020 (irinotecan held) Cycle 8 FOLFOXIRI/Avastin 10/18/2020  Cycle 9 FOLFOXIRI/Avastin 11/01/2020 Cycle 10 FOLFOXIRI/Avastin 11/22/2020 (oxaliplatin held, irinotecan and 5-FU dose reduced) Cycle 11 FOLFOXIRI/Avastin 12/12/2020 (oxaliplatin held) CTs 12/19/2020-decrease in size of liver metastases, no evidence of disease progression Cycle 12 FOLFOXIRI/Avastin 01/04/2021 (oxaliplatin held) Cycle 13 FOLFOXIRI/Avastin 01/24/2021 (oxaliplatin held) Cycle 14 FOLFOXIRI/Avastin 02/14/2021 (oxaliplatin held) Cycle 15 FOLFOXIRI/Avastin 03/07/2021 (oxaliplatin held) Cycle 16 FOLFOXIRI/Avastin 03/29/2021 (oxaliplatin held) CTs 04/14/2021- decreased size of liver metastases.  No new or progressive findings. Cycle 17 FOLFOXIRI/Avastin 04/19/2021 (oxaliplatin held) Cycle 18 FOLFOXIRI/Avastin 05/09/2021 (oxaliplatin held) Cycle 19 FOLFOXIRI/Avastin 05/30/2021 (oxaliplatin held) Cycle 20 FOLFOXIRI/Avastin 06/20/2021 (oxaliplatin held) Cycle 21 FOLFOXIRI/Avastin 07/11/2021 (oxaliplatin held) CTs 07/28/2021-unchanged subcentimeter liver lesions, no evidence of new metastatic disease Cycle 22 5-FU/ Avastin 08/01/2021 Cycle 23 5-FU/Avastin 08/22/2021 Cycle 24  5-FU/Avastin 09/12/2021 Cycle 25 5-FU/Avastin 10/03/2021 Cycle 26 5-FU/Avastin 10/24/2021 Cycle 27 5-FU/Avastin 11/15/2021 Cycle 28 5-FU/Avastin 12/05/2021 CTs 12/22/2021-grossly  similar subcentimeter low-attenuation lesions in the liver compatible with treated metastasis.  No evidence for new metastatic disease in the chest, abdomen or pelvis. Cycle 29 5-FU/Avastin 12/26/2021 Cycle 30 5-FU/Avastin 01/16/2022 Cycle 41 5-FU/Avastin 02/06/2022 Cycle 42 5-FU/Avastin 02/27/2022 Cycle 43 5-FU/Avastin 03/20/2022 Cycle 44 5-FU/Avastin 04/10/2022 CT 04/28/2022-interval development of a 3 mm nodule within the left lower lobe and 3 mm nodule within the right lower lobe, nonspecific.  Unchanged subcentimeter low-attenuation lesions in the liver compatible with treated metastasis. Cycle 45 5-FU/Avastin 05/01/2022 Cycle 46 5-FU/Avastin 05/29/2022 Cycle 47 5-FU/Avastin 06/19/2022 Cycle 48 5-FU/Avastin 07/10/2022 Cycle 49 5-FU/Avastin 07/31/2022 Cycle 50 5-FU/Avastin 08/21/2022 CTs 09/07/2022-no evidence of metastatic disease, tiny lung nodules described as new on previous exam have resolved, no hepatic metastases are appreciated Cycle 51 5-FU/Avastin 09/11/2022 Cycle 52 5-FU/Avastin 10/02/2022 Cycle 53 5-FU/Avastin 10/23/2022 Cycle 54 5-FU/Avastin 11/20/2021     Atrial fibrillation with rapid ventricular response 05/26/2020 Mild neutropenia following cycle 3 FOLFOXIRI, Udenyca added with cycle 4 Hypokalemia secondary to diarrhea-potassium supplementation starting 09/13/2020 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

## 2022-12-11 NOTE — Patient Instructions (Signed)
[facility]   Discharge Instructions: Thank you for choosing [facility] to provide your oncology and hematology care.   If you have a lab appointment with the [facility], please go directly to the [facility] and check in at the registration area.   Wear comfortable clothing and clothing appropriate for easy access to any Portacath or PICC line.   We strive to give you quality time with your provider. You may need to reschedule your appointment if you arrive late (15 or more minutes).  Arriving late affects you and other patients whose appointments are after yours.  Also, if you miss three or more appointments without notifying the office, you may be dismissed from the clinic at the provider's discretion.      For prescription refill requests, have your pharmacy contact our office and allow 72 hours for refills to be completed.    Today you received the following chemotherapy and/or immunotherapy agents Bevacizumab-bvzr (ZIRABEV) & Flourouracil (ADRUCIL).      To help prevent nausea and vomiting after your treatment, we encourage you to take your nausea medication as directed.  BELOW ARE SYMPTOMS THAT SHOULD BE REPORTED IMMEDIATELY: *FEVER GREATER THAN 100.4 F (38 C) OR HIGHER *CHILLS OR SWEATING *NAUSEA AND VOMITING THAT IS NOT CONTROLLED WITH YOUR NAUSEA MEDICATION *UNUSUAL SHORTNESS OF BREATH *UNUSUAL BRUISING OR BLEEDING *URINARY PROBLEMS (pain or burning when urinating, or frequent urination) *BOWEL PROBLEMS (unusual diarrhea, constipation, pain near the anus) TENDERNESS IN MOUTH AND THROAT WITH OR WITHOUT PRESENCE OF ULCERS (sore throat, sores in mouth, or a toothache) UNUSUAL RASH, SWELLING OR PAIN  UNUSUAL VAGINAL DISCHARGE OR ITCHING   Items with * indicate a potential emergency and should be followed up as soon as possible or go to the Emergency Department if any problems should occur.  Please show the CHEMOTHERAPY ALERT  CARD or IMMUNOTHERAPY ALERT CARD at check-in to the Emergency Department and triage nurse.  Should you have questions after your visit or need to cancel or reschedule your appointment, please contact [facility]  Dept: [phone]  and follow the prompts.  Office hours are 8:00 a.m. to 4:30 p.m. Monday - Friday. Please note that voicemails left after 4:00 p.m. may not be returned until the following business day.  We are closed weekends and major holidays. You have access to a nurse at all times for urgent questions. Please call the main number to the clinic Dept: [phone] and follow the prompts.   For any non-urgent questions, you may also contact your provider using MyChart. We now offer e-Visits for anyone 74 and older to request care online for non-urgent symptoms. For details visit [email or web address].   Also download the MyChart app! Go to the app store, search "MyChart", open the app, select [facility], and log in with your MyChart username and password.  Bevacizumab Injection What is this medication? BEVACIZUMAB (be va SIZ yoo mab) treats some types of cancer. It works by blocking a protein that causes cancer cells to grow and multiply. This helps to slow or stop the spread of cancer cells. It is a monoclonal antibody. This medicine may be used for other purposes; ask your health care provider or pharmacist if you have questions. COMMON BRAND NAME(S): Alymsys, Avastin, MVASI, [name] What should I tell my care team before I take this medication? They need to know if you have any of these conditions: Blood clots Coughing up blood Having or  recent surgery Heart failure High blood pressure History of a connection between 2 or more body parts that do not usually connect (fistula) History of a tear in your stomach or intestines Protein in your urine An unusual or allergic reaction to bevacizumab, other medications, foods, dyes, or  preservatives Pregnant or trying to get pregnant Breast-feeding How should I use this medication? This medication is injected into a vein. It is given by your care team in a hospital or clinic setting. Talk to your care team the use of this medication in children. Special care may be needed. Overdosage: If you think you have taken too much of this medicine contact a poison control center or emergency room at once. NOTE: This medicine is only for you. Do not share this medicine with others. What if I miss a dose? Keep appointments for follow-up doses. It is important not to miss your dose. Call your care team if you are unable to keep an appointment. What may interact with this medication? Interactions are not expected. This list may not describe all possible interactions. Give your health care provider a list of all the medicines, herbs, non-prescription drugs, or dietary supplements you use. Also tell them if you smoke, drink alcohol, or use illegal drugs. Some items may interact with your medicine. What should I watch for while using this medication? Your condition will be monitored carefully while you are receiving this medication. You may need blood work while taking this medication. This medication may make you feel generally unwell. This is not uncommon as chemotherapy can affect healthy cells as well as cancer cells. Report any side effects. Continue your course of treatment even though you feel ill unless your care team tells you to stop. This medication may increase your risk to bruise or bleed. Call your care team if you notice any unusual bleeding. Before having surgery, talk to your care team to make sure it is ok. This medication can increase the risk of poor healing of your surgical site or wound. You will need to stop this medication for 28 days before surgery. After surgery, wait at least 28 days before restarting this medication. Make sure the surgical site or wound is healed enough  before restarting this medication. Talk to your care team if questions. Talk to your care team if you may be pregnant. Serious birth defects can occur if you take this medication during pregnancy and for 6 months after the last dose. Contraception is recommended while taking this medication and for 6 months after the last dose. Your care team can help you find the option that works for you. Do not breastfeed while taking this medication and for 6 months after the last dose. This medication can cause infertility. Talk to your care team if you are concerned about your fertility. What side effects may I notice from receiving this medication? Side effects that you should report to your care team as soon as possible: Allergic reactions--skin rash, itching, hives, swelling of the face, lips, tongue, or throat Bleeding--bloody or black, tar-like stools, vomiting blood or brown material that looks like coffee grounds, red or dark brown urine, small red or purple spots on skin, unusual bruising or bleeding Blood clot--pain, swelling, or warmth in the leg, shortness of breath, chest pain Heart attack--pain or tightness in the chest, shoulders, arms, or jaw, nausea, shortness of breath, cold or clammy skin, feeling faint or lightheaded Heart failure--shortness of breath, swelling of the ankles, feet, or hands, sudden weight gain,  unusual weakness or fatigue Increase in blood pressure Infection--fever, chills, cough, sore throat, wounds that don't heal, pain or trouble when passing urine, general feeling of discomfort or being unwell Infusion reactions--chest pain, shortness of breath or trouble breathing, feeling faint or lightheaded Kidney injury--decrease in the amount of urine, swelling of the ankles, hands, or feet Stomach pain that is severe, does not go away, or gets worse Stroke--sudden numbness or weakness of the face, arm, or leg, trouble speaking, confusion, trouble walking, loss of balance or  coordination, dizziness, severe headache, change in vision Sudden and severe headache, confusion, change in vision, seizures, which may be signs of posterior reversible encephalopathy syndrome (PRES) Side effects that usually do not require medical attention (report to your care team if they continue or are bothersome): Back pain Change in taste Diarrhea Dry skin Increased tears Nosebleed This list may not describe all possible side effects. Call your doctor for medical advice about side effects. You may report side effects to FDA at 1-800-FDA-1088. Where should I keep my medication? This medication is given in a hospital or clinic. It will not be stored at home. NOTE: This sheet is a summary. It may not cover all possible information. If you have questions about this medicine, talk to your doctor, pharmacist, or health care provider.  2023 Elsevier/Gold Standard (2022-03-09 00:00:00)  Fluorouracil Injection What is this medication? FLUOROURACIL (flure oh YOOR a sil) treats some types of cancer. It works by slowing down the growth of cancer cells. This medicine may be used for other purposes; ask your health care provider or pharmacist if you have questions. COMMON BRAND NAME(S): Adrucil What should I tell my care team before I take this medication? They need to know if you have any of these conditions: Blood disorders Dihydropyrimidine dehydrogenase (DPD) deficiency Infection, such as chickenpox, cold sores, herpes Kidney disease Liver disease Poor nutrition Recent or ongoing radiation therapy An unusual or allergic reaction to fluorouracil, other medications, foods, dyes, or preservatives If you or your partner are pregnant or trying to get pregnant Breast-feeding How should I use this medication? This medication is injected into a vein. It is administered by your care team in a hospital or clinic setting. Talk to your care team about the use of this medication in children.  Special care may be needed. Overdosage: If you think you have taken too much of this medicine contact a poison control center or emergency room at once. NOTE: This medicine is only for you. Do not share this medicine with others. What if I miss a dose? Keep appointments for follow-up doses. It is important not to miss your dose. Call your care team if you are unable to keep an appointment. What may interact with this medication? Do not take this medication with any of the following: Live virus vaccines This medication may also interact with the following: Medications that treat or prevent blood clots, such as warfarin, enoxaparin, dalteparin This list may not describe all possible interactions. Give your health care provider a list of all the medicines, herbs, non-prescription drugs, or dietary supplements you use. Also tell them if you smoke, drink alcohol, or use illegal drugs. Some items may interact with your medicine. What should I watch for while using this medication? Your condition will be monitored carefully while you are receiving this medication. This medication may make you feel generally unwell. This is not uncommon as chemotherapy can affect healthy cells as well as cancer cells. Report any side effects. Continue  your course of treatment even though you feel ill unless your care team tells you to stop. In some cases, you may be given additional medications to help with side effects. Follow all directions for their use. This medication may increase your risk of getting an infection. Call your care team for advice if you get a fever, chills, sore throat, or other symptoms of a cold or flu. Do not treat yourself. Try to avoid being around people who are sick. This medication may increase your risk to bruise or bleed. Call your care team if you notice any unusual bleeding. Be careful brushing or flossing your teeth or using a toothpick because you may get an infection or bleed more easily.  If you have any dental work done, tell your dentist you are receiving this medication. Avoid taking medications that contain aspirin, acetaminophen, ibuprofen, naproxen, or ketoprofen unless instructed by your care team. These medications may hide a fever. Do not treat diarrhea with over the counter products. Contact your care team if you have diarrhea that lasts more than 2 days or if it is severe and watery. This medication can make you more sensitive to the sun. Keep out of the sun. If you cannot avoid being in the sun, wear protective clothing and sunscreen. Do not use sun lamps, tanning beds, or tanning booths. Talk to your care team if you or your partner wish to become pregnant or think you might be pregnant. This medication can cause serious birth defects if taken during pregnancy and for 3 months after the last dose. A reliable form of contraception is recommended while taking this medication and for 3 months after the last dose. Talk to your care team about effective forms of contraception. Do not father a child while taking this medication and for 3 months after the last dose. Use a condom while having sex during this time period. Do not breastfeed while taking this medication. This medication may cause infertility. Talk to your care team if you are concerned about your fertility. What side effects may I notice from receiving this medication? Side effects that you should report to your care team as soon as possible: Allergic reactions--skin rash, itching, hives, swelling of the face, lips, tongue, or throat Heart attack--pain or tightness in the chest, shoulders, arms, or jaw, nausea, shortness of breath, cold or clammy skin, feeling faint or lightheaded Heart failure--shortness of breath, swelling of the ankles, feet, or hands, sudden weight gain, unusual weakness or fatigue Heart rhythm changes--fast or irregular heartbeat, dizziness, feeling faint or lightheaded, chest pain, trouble  breathing High ammonia level--unusual weakness or fatigue, confusion, loss of appetite, nausea, vomiting, seizures Infection--fever, chills, cough, sore throat, wounds that don't heal, pain or trouble when passing urine, general feeling of discomfort or being unwell Low red blood cell level--unusual weakness or fatigue, dizziness, headache, trouble breathing Pain, tingling, or numbness in the hands or feet, muscle weakness, change in vision, confusion or trouble speaking, loss of balance or coordination, trouble walking, seizures Redness, swelling, and blistering of the skin over hands and feet Severe or prolonged diarrhea Unusual bruising or bleeding Side effects that usually do not require medical attention (report to your care team if they continue or are bothersome): Dry skin Headache Increased tears Nausea Pain, redness, or swelling with sores inside the mouth or throat Sensitivity to light Vomiting This list may not describe all possible side effects. Call your doctor for medical advice about side effects. You may report side effects to  FDA at 1-800-FDA-1088. Where should I keep my medication? This medication is given in a hospital or clinic. It will not be stored at home. NOTE: This sheet is a summary. It may not cover all possible information. If you have questions about this medicine, talk to your doctor, pharmacist, or health care provider.  2023 Elsevier/Gold Standard (2022-03-06 00:00:00)  The chemotherapy medication bag should finish at 46 hours, 96 hours, or 7 days. For example, if your pump is scheduled for 46 hours and it was put on at 4:00 p.m., it should finish at 2:00 p.m. the day it is scheduled to come off regardless of your appointment time.     Estimated time to finish at 1:00 p.m. on Thursday 12/13/2022.   If the display on your pump reads "Low Volume" and it is beeping, take the batteries out of the pump and come to the cancer center for it to be taken off.   If  the pump alarms go off prior to the pump reading "Low Volume" then call [phone] and someone can assist you.  If the plunger comes out and the chemotherapy medication is leaking out, please use your home chemo spill kit to clean up the spill. Do NOT use paper towels or other household products.  If you have problems or questions regarding your pump, please call either [phone] (24 hours a day) or the cancer center Monday-Friday 8:00 a.m.- 4:30 p.m. at the clinic number and we will assist you. If you are unable to get assistance, then go to the nearest Emergency Department and ask the staff to contact the IV team for assistance.

## 2022-12-11 NOTE — Progress Notes (Signed)
Patient seen by Dr. Benay Spice today  Vitals are within treatment parameters.  Labs reviewed by Dr. Benay Spice CBC diff reviewed and within treatment parameters, CMP pending. Per MD Benay Spice, wait for CMP results today prior to proceeding with tx.   Per physician team, patient is ready for treatment and there are NO modifications to the treatment plan.

## 2022-12-11 NOTE — Progress Notes (Signed)
[place] OFFICE PROGRESS NOTE   Diagnosis: Colon cancer  INTERVAL HISTORY:   [name] returns as scheduled.  She completed on cycle of 5-FU/Avastin on 11/20/2022.  No nausea, diarrhea, bleeding, or symptom of thrombosis.  Stable neuropathy symptoms.  Objective:  Vital signs in last 24 hours:  Blood pressure (!) 153/73, pulse 69, temperature 98.1 F (36.7 C), temperature source Oral, resp. rate 20, height '5\' 6"'$  (1.676 m), weight 156 lb 9.6 oz (71 kg), last menstrual period 11/19/1996, SpO2 100 %.    HEENT: No thrush or ulcers Resp: Lungs clear bilaterally Cardio: Regular rate and rhythm GI: No hepatosplenomegaly, no mass, nontender Vascular: No leg edema  Skin: Palms without erythema  Portacath/PICC-without erythema  Lab Results:  Lab Results  Component Value Date   WBC 4.2 12/11/2022   HGB 12.8 12/11/2022   HCT 38.5 12/11/2022   MCV 106.4 (H) 12/11/2022   PLT 186 12/11/2022   NEUTROABS 1.9 12/11/2022    CMP  Lab Results  Component Value Date   NA 136 11/20/2022   K 4.5 11/20/2022   CL 105 11/20/2022   CO2 26 11/20/2022   GLUCOSE 79 11/20/2022   BUN 25 (H) 11/20/2022   CREATININE 0.91 11/20/2022   CALCIUM 9.2 11/20/2022   PROT 6.6 11/20/2022   ALBUMIN 4.0 11/20/2022   AST 23 11/20/2022   ALT 16 11/20/2022   ALKPHOS 58 11/20/2022   BILITOT 0.4 11/20/2022   GFRNONAA >60 11/20/2022   GFRAA >60 08/16/2020    Lab Results  Component Value Date   CEA1 1.16 03/29/2021   CEA 1.57 10/23/2022    Lab Results  Component Value Date   INR 1.1 05/30/2020   LABPROT 13.5 05/30/2020    Imaging:  No results found.  Medications: I have reviewed the patient's current medications.   Assessment/Plan: Adenocarcinoma the left colon, stage IIIc (pT4b,pN2a), status post a left colectomy 05/30/2020, MSS, TMB 13, BRAF V600E Tumor invades the visceral peritoneum, lymphovascular and perineural invasion present, for tumor deposits, 7/13 lymph nodes,  negative resection margins, no loss of mismatch repair protein expression CT abdomen/pelvis 05/26/2020-long segment of masslike thickening of the descending colon with associated high-grade colonic obstruction, small colonic wall defect with evidence of a focally contained microperforation, retroperitoneal adenopathy, indeterminate small hypodense liver lesions Elevated preoperative CEA PET 06/22/2020-hypermetabolic liver metastases, periportal and periaortic adenopathy, hypermetabolic mediastinal and left supraclavicular nodes Cycle 1 FOLFOX 07/05/20 Cycle 2 FOLFOXIRI (dose-reduced 5FU, no bolus) and Avastin 07/19/20  Cycle 3 FOLFOXIRI (Dose reduced 5-FU, no bolus)/Avastin 08/02/2020 Cycle 4 FOLFOXIRI/Avastin 08/16/2020 (Udenyca added) Cycle 5 FOLFOXIRI/Avastin 08/30/2020 CTs 09/09/2020-diminished size of lymph nodes in the chest, abdomen, and pelvis.  Decreased hepatic metastases.  No new evidence of disease progression, fat density lesion surrounding the left hemicolectomy Cycle 6 FOLFOXIRI/Avastin 09/13/2020 Cycle 7 FOLFOX/Avastin 09/27/2020 (irinotecan held) Cycle 8 FOLFOXIRI/Avastin 10/18/2020  Cycle 9 FOLFOXIRI/Avastin 11/01/2020 Cycle 10 FOLFOXIRI/Avastin 11/22/2020 (oxaliplatin held, irinotecan and 5-FU dose reduced) Cycle 11 FOLFOXIRI/Avastin 12/12/2020 (oxaliplatin held) CTs 12/19/2020-decrease in size of liver metastases, no evidence of disease progression Cycle 12 FOLFOXIRI/Avastin 01/04/2021 (oxaliplatin held) Cycle 13 FOLFOXIRI/Avastin 01/24/2021 (oxaliplatin held) Cycle 14 FOLFOXIRI/Avastin 02/14/2021 (oxaliplatin held) Cycle 15 FOLFOXIRI/Avastin 03/07/2021 (oxaliplatin held) Cycle 16 FOLFOXIRI/Avastin 03/29/2021 (oxaliplatin held) CTs 04/14/2021- decreased size of liver metastases.  No new or progressive findings. Cycle 17 FOLFOXIRI/Avastin 04/19/2021 (oxaliplatin held) Cycle 18 FOLFOXIRI/Avastin 05/09/2021 (oxaliplatin held) Cycle 19 FOLFOXIRI/Avastin 05/30/2021 (oxaliplatin held) Cycle 20  FOLFOXIRI/Avastin 06/20/2021 (oxaliplatin held) Cycle 21 FOLFOXIRI/Avastin 07/11/2021 (oxaliplatin held) CTs 07/28/2021-unchanged  subcentimeter liver lesions, no evidence of new metastatic disease Cycle 22 5-FU/ Avastin 08/01/2021 Cycle 23 5-FU/Avastin 08/22/2021 Cycle 24 5-FU/Avastin 09/12/2021 Cycle 25 5-FU/Avastin 10/03/2021 Cycle 26 5-FU/Avastin 10/24/2021 Cycle 27 5-FU/Avastin 11/15/2021 Cycle 28 5-FU/Avastin 12/05/2021 CTs 12/22/2021-grossly similar subcentimeter low-attenuation lesions in the liver compatible with treated metastasis.  No evidence for new metastatic disease in the chest, abdomen or pelvis. Cycle 29 5-FU/Avastin 12/26/2021 Cycle 30 5-FU/Avastin 01/16/2022 Cycle 41 5-FU/Avastin 02/06/2022 Cycle 42 5-FU/Avastin 02/27/2022 Cycle 43 5-FU/Avastin 03/20/2022 Cycle 44 5-FU/Avastin 04/10/2022 CT 04/28/2022-interval development of a 3 mm nodule within the left lower lobe and 3 mm nodule within the right lower lobe, nonspecific.  Unchanged subcentimeter low-attenuation lesions in the liver compatible with treated metastasis. Cycle 45 5-FU/Avastin 05/01/2022 Cycle 46 5-FU/Avastin 05/29/2022 Cycle 47 5-FU/Avastin 06/19/2022 Cycle 48 5-FU/Avastin 07/10/2022 Cycle 49 5-FU/Avastin 07/31/2022 Cycle 50 5-FU/Avastin 08/21/2022 CTs 09/07/2022-no evidence of metastatic disease, tiny lung nodules described as new on previous exam have resolved, no hepatic metastases are appreciated Cycle 51 5-FU/Avastin 09/11/2022 Cycle 52 5-FU/Avastin 10/02/2022 Cycle 53 5-FU/Avastin 10/23/2022 Cycle 54 5-FU/Avastin 11/20/2021 Cycle 55 5-FU/Avastin 12/11/2022     Atrial fibrillation with rapid ventricular response 05/26/2020 Mild neutropenia following cycle 3 FOLFOXIRI, Udenyca added with cycle 4 Hypokalemia secondary to diarrhea-potassium supplementation starting 09/13/2020 Oxaliplatin neuropathy-moderate loss of vibratory sense on exam 11/22/2020, oxaliplatin held with cycle 10, cycle 11, 12, 13 chemotherapy, improved        Disposition: Kathy Robinson appears stable.  She continues to tolerate the 5-FU/Avastin well.  She will complete another cycle today.  Chemotherapy will then be switched to an every 4-week schedule.  We will plan for restaging CTs in April.  [name], MD  12/11/2022  10:56 AM

## 2022-12-12 ENCOUNTER — Other Ambulatory Visit: Payer: Self-pay

## 2022-12-13 ENCOUNTER — Inpatient Hospital Stay: Payer: Medicare Other

## 2022-12-13 VITALS — BP 139/88 | HR 89 | Temp 97.9°F | Resp 18

## 2022-12-13 DIAGNOSIS — Z5111 Encounter for antineoplastic chemotherapy: Secondary | ICD-10-CM | POA: Diagnosis not present

## 2022-12-13 DIAGNOSIS — Z5189 Encounter for other specified aftercare: Secondary | ICD-10-CM | POA: Diagnosis not present

## 2022-12-13 DIAGNOSIS — C778 Secondary and unspecified malignant neoplasm of lymph nodes of multiple regions: Secondary | ICD-10-CM | POA: Diagnosis not present

## 2022-12-13 DIAGNOSIS — C787 Secondary malignant neoplasm of liver and intrahepatic bile duct: Secondary | ICD-10-CM | POA: Diagnosis not present

## 2022-12-13 DIAGNOSIS — C186 Malignant neoplasm of descending colon: Secondary | ICD-10-CM | POA: Diagnosis not present

## 2022-12-13 DIAGNOSIS — R197 Diarrhea, unspecified: Secondary | ICD-10-CM | POA: Diagnosis not present

## 2022-12-13 DIAGNOSIS — I4891 Unspecified atrial fibrillation: Secondary | ICD-10-CM | POA: Diagnosis not present

## 2022-12-13 DIAGNOSIS — E876 Hypokalemia: Secondary | ICD-10-CM | POA: Diagnosis not present

## 2022-12-13 DIAGNOSIS — C185 Malignant neoplasm of splenic flexure: Secondary | ICD-10-CM

## 2022-12-13 MED ORDER — SODIUM CHLORIDE 0.9% FLUSH
10.0000 mL | INTRAVENOUS | Status: DC | PRN
  Administered 2022-12-13: 10 mL

## 2022-12-13 MED ORDER — HEPARIN SOD (PORK) LOCK FLUSH 100 UNIT/ML IV SOLN
500.0000 [IU] | Freq: Once | INTRAVENOUS | Status: AC | PRN
  Administered 2022-12-13: 500 [IU]

## 2022-12-13 NOTE — Patient Instructions (Signed)

## 2023-01-06 ENCOUNTER — Other Ambulatory Visit: Payer: Self-pay | Admitting: Oncology

## 2023-01-08 ENCOUNTER — Encounter: Payer: Self-pay | Admitting: *Deleted

## 2023-01-08 ENCOUNTER — Inpatient Hospital Stay: Payer: Medicare Other

## 2023-01-08 ENCOUNTER — Inpatient Hospital Stay: Payer: Medicare Other | Admitting: Oncology

## 2023-01-08 ENCOUNTER — Inpatient Hospital Stay: Payer: Medicare Other | Attending: Oncology

## 2023-01-08 VITALS — BP 145/69 | HR 73 | Temp 98.2°F | Resp 18 | Ht 66.0 in | Wt 157.0 lb

## 2023-01-08 VITALS — BP 158/64 | HR 60

## 2023-01-08 DIAGNOSIS — C787 Secondary malignant neoplasm of liver and intrahepatic bile duct: Secondary | ICD-10-CM | POA: Diagnosis not present

## 2023-01-08 DIAGNOSIS — C186 Malignant neoplasm of descending colon: Secondary | ICD-10-CM | POA: Insufficient documentation

## 2023-01-08 DIAGNOSIS — E876 Hypokalemia: Secondary | ICD-10-CM | POA: Insufficient documentation

## 2023-01-08 DIAGNOSIS — C185 Malignant neoplasm of splenic flexure: Secondary | ICD-10-CM

## 2023-01-08 DIAGNOSIS — R599 Enlarged lymph nodes, unspecified: Secondary | ICD-10-CM | POA: Diagnosis not present

## 2023-01-08 DIAGNOSIS — R197 Diarrhea, unspecified: Secondary | ICD-10-CM | POA: Insufficient documentation

## 2023-01-08 DIAGNOSIS — Z5111 Encounter for antineoplastic chemotherapy: Secondary | ICD-10-CM | POA: Diagnosis not present

## 2023-01-08 DIAGNOSIS — I4891 Unspecified atrial fibrillation: Secondary | ICD-10-CM | POA: Diagnosis not present

## 2023-01-08 LAB — CBC WITH DIFFERENTIAL (CANCER CENTER ONLY)
Abs Immature Granulocytes: 0.01 10*3/uL (ref 0.00–0.07)
Basophils Absolute: 0 10*3/uL (ref 0.0–0.1)
Basophils Relative: 1 %
Eosinophils Absolute: 0.3 10*3/uL (ref 0.0–0.5)
Eosinophils Relative: 6 %
HCT: 38.3 % (ref 36.0–46.0)
Hemoglobin: 12.9 g/dL (ref 12.0–15.0)
Immature Granulocytes: 0 %
Lymphocytes Relative: 32 %
Lymphs Abs: 1.7 10*3/uL (ref 0.7–4.0)
MCH: 35.6 pg — ABNORMAL HIGH (ref 26.0–34.0)
MCHC: 33.7 g/dL (ref 30.0–36.0)
MCV: 105.8 fL — ABNORMAL HIGH (ref 80.0–100.0)
Monocytes Absolute: 0.4 10*3/uL (ref 0.1–1.0)
Monocytes Relative: 8 %
Neutro Abs: 2.8 10*3/uL (ref 1.7–7.7)
Neutrophils Relative %: 53 %
Platelet Count: 178 10*3/uL (ref 150–400)
RBC: 3.62 MIL/uL — ABNORMAL LOW (ref 3.87–5.11)
RDW: 13.3 % (ref 11.5–15.5)
WBC Count: 5.3 10*3/uL (ref 4.0–10.5)
nRBC: 0 % (ref 0.0–0.2)

## 2023-01-08 LAB — CMP (CANCER CENTER ONLY)
ALT: 23 U/L (ref 0–44)
AST: 28 U/L (ref 15–41)
Albumin: 4 g/dL (ref 3.5–5.0)
Alkaline Phosphatase: 60 U/L (ref 38–126)
Anion gap: 5 (ref 5–15)
BUN: 31 mg/dL — ABNORMAL HIGH (ref 8–23)
CO2: 27 mmol/L (ref 22–32)
Calcium: 9.5 mg/dL (ref 8.9–10.3)
Chloride: 104 mmol/L (ref 98–111)
Creatinine: 0.99 mg/dL (ref 0.44–1.00)
GFR, Estimated: 58 mL/min — ABNORMAL LOW (ref >60)
Glucose, Bld: 75 mg/dL (ref 70–99)
Potassium: 4.6 mmol/L (ref 3.5–5.1)
Sodium: 136 mmol/L (ref 135–145)
Total Bilirubin: 0.4 mg/dL (ref 0.3–1.2)
Total Protein: 6.3 g/dL — ABNORMAL LOW (ref 6.5–8.1)

## 2023-01-08 MED ORDER — SODIUM CHLORIDE 0.9 % IV SOLN
7.5000 mg/kg | Freq: Once | INTRAVENOUS | Status: AC
  Administered 2023-01-08: 500 mg via INTRAVENOUS
  Filled 2023-01-08: qty 16

## 2023-01-08 MED ORDER — SODIUM CHLORIDE 0.9 % IV SOLN
1600.0000 mg/m2 | INTRAVENOUS | Status: DC
  Administered 2023-01-08: 2900 mg via INTRAVENOUS
  Filled 2023-01-08: qty 58

## 2023-01-08 MED ORDER — SODIUM CHLORIDE 0.9 % IV SOLN: Freq: Once | INTRAVENOUS | Status: AC

## 2023-01-08 MED ORDER — SODIUM CHLORIDE 0.9% FLUSH
10.0000 mL | INTRAVENOUS | Status: DC | PRN
  Administered 2023-01-08: 10 mL

## 2023-01-08 NOTE — Patient Instructions (Signed)

## 2023-01-08 NOTE — Progress Notes (Signed)
[place] OFFICE PROGRESS NOTE   Diagnosis: Colon cancer  INTERVAL HISTORY:   Kathy Robinson completed on cycle of 5-FU/bevacizumab beginning 12/11/2022.  No mouth sores, nausea, or diarrhea.  She reports pain in the valve has resolved.  Objective:  Vital signs in last 24 hours:  Blood pressure (!) 145/69, pulse 73, temperature 98.2 F (36.8 C), temperature source Oral, resp. rate 18, height 5' 6"$  (1.676 m), weight 157 lb (71.2 kg), last menstrual period 11/19/1996, SpO2 98 %.    HEENT: No thrush or ulcers Resp: Lungs clear bilaterally Cardio: Regular rate and rhythm GI: No hepatosplenomegaly Vascular: No leg edema  Portacath/PICC-without erythema  Lab Results:  Lab Results  Component Value Date   WBC 5.3 01/08/2023   HGB 12.9 01/08/2023   HCT 38.3 01/08/2023   MCV 105.8 (H) 01/08/2023   PLT 178 01/08/2023   NEUTROABS 2.8 01/08/2023    CMP  Lab Results  Component Value Date   NA 136 12/11/2022   K 4.4 12/11/2022   CL 102 12/11/2022   CO2 28 12/11/2022   GLUCOSE 84 12/11/2022   BUN 24 (H) 12/11/2022   CREATININE 1.07 (H) 12/11/2022   CALCIUM 9.6 12/11/2022   PROT 6.6 12/11/2022   ALBUMIN 3.9 12/11/2022   AST 22 12/11/2022   ALT 14 12/11/2022   ALKPHOS 44 12/11/2022   BILITOT 0.5 12/11/2022   GFRNONAA 53 (L) 12/11/2022   GFRAA >60 08/16/2020    Lab Results  Component Value Date   CEA1 1.16 03/29/2021   CEA 1.22 12/11/2022     Medications: I have reviewed the patient's current medications.   Assessment/Plan: Adenocarcinoma the left colon, stage IIIc (pT4b,pN2a), status post a left colectomy 05/30/2020, MSS, TMB 13, BRAF V600E Tumor invades the visceral peritoneum, lymphovascular and perineural invasion present, for tumor deposits, 7/13 lymph nodes, negative resection margins, no loss of mismatch repair protein expression CT abdomen/pelvis 05/26/2020-long segment of masslike thickening of the descending colon with associated high-grade  colonic obstruction, small colonic wall defect with evidence of a focally contained microperforation, retroperitoneal adenopathy, indeterminate small hypodense liver lesions Elevated preoperative CEA PET 06/22/2020-hypermetabolic liver metastases, periportal and periaortic adenopathy, hypermetabolic mediastinal and left supraclavicular nodes Cycle 1 FOLFOX 07/05/20 Cycle 2 FOLFOXIRI (dose-reduced 5FU, no bolus) and Avastin 07/19/20  Cycle 3 FOLFOXIRI (Dose reduced 5-FU, no bolus)/Avastin 08/02/2020 Cycle 4 FOLFOXIRI/Avastin 08/16/2020 (Udenyca added) Cycle 5 FOLFOXIRI/Avastin 08/30/2020 CTs 09/09/2020-diminished size of lymph nodes in the chest, abdomen, and pelvis.  Decreased hepatic metastases.  No new evidence of disease progression, fat density lesion surrounding the left hemicolectomy Cycle 6 FOLFOXIRI/Avastin 09/13/2020 Cycle 7 FOLFOX/Avastin 09/27/2020 (irinotecan held) Cycle 8 FOLFOXIRI/Avastin 10/18/2020  Cycle 9 FOLFOXIRI/Avastin 11/01/2020 Cycle 10 FOLFOXIRI/Avastin 11/22/2020 (oxaliplatin held, irinotecan and 5-FU dose reduced) Cycle 11 FOLFOXIRI/Avastin 12/12/2020 (oxaliplatin held) CTs 12/19/2020-decrease in size of liver metastases, no evidence of disease progression Cycle 12 FOLFOXIRI/Avastin 01/04/2021 (oxaliplatin held) Cycle 13 FOLFOXIRI/Avastin 01/24/2021 (oxaliplatin held) Cycle 14 FOLFOXIRI/Avastin 02/14/2021 (oxaliplatin held) Cycle 15 FOLFOXIRI/Avastin 03/07/2021 (oxaliplatin held) Cycle 16 FOLFOXIRI/Avastin 03/29/2021 (oxaliplatin held) CTs 04/14/2021- decreased size of liver metastases.  No new or progressive findings. Cycle 17 FOLFOXIRI/Avastin 04/19/2021 (oxaliplatin held) Cycle 18 FOLFOXIRI/Avastin 05/09/2021 (oxaliplatin held) Cycle 19 FOLFOXIRI/Avastin 05/30/2021 (oxaliplatin held) Cycle 20 FOLFOXIRI/Avastin 06/20/2021 (oxaliplatin held) Cycle 21 FOLFOXIRI/Avastin 07/11/2021 (oxaliplatin held) CTs 07/28/2021-unchanged subcentimeter liver lesions, no evidence of new metastatic  disease Cycle 22 5-FU/ Avastin 08/01/2021 Cycle 23 5-FU/Avastin 08/22/2021 Cycle 24 5-FU/Avastin 09/12/2021 Cycle 25 5-FU/Avastin 10/03/2021 Cycle 26 5-FU/Avastin 10/24/2021 Cycle 27 5-FU/Avastin 11/15/2021  Cycle 28 5-FU/Avastin 12/05/2021 CTs 12/22/2021-grossly similar subcentimeter low-attenuation lesions in the liver compatible with treated metastasis.  No evidence for new metastatic disease in the chest, abdomen or pelvis. Cycle 29 5-FU/Avastin 12/26/2021 Cycle 30 5-FU/Avastin 01/16/2022 Cycle 41 5-FU/Avastin 02/06/2022 Cycle 42 5-FU/Avastin 02/27/2022 Cycle 43 5-FU/Avastin 03/20/2022 Cycle 44 5-FU/Avastin 04/10/2022 CT 04/28/2022-interval development of a 3 mm nodule within the left lower lobe and 3 mm nodule within the right lower lobe, nonspecific.  Unchanged subcentimeter low-attenuation lesions in the liver compatible with treated metastasis. Cycle 45 5-FU/Avastin 05/01/2022 Cycle 46 5-FU/Avastin 05/29/2022 Cycle 47 5-FU/Avastin 06/19/2022 Cycle 48 5-FU/Avastin 07/10/2022 Cycle 49 5-FU/Avastin 07/31/2022 Cycle 50 5-FU/Avastin 08/21/2022 CTs 09/07/2022-no evidence of metastatic disease, tiny lung nodules described as new on previous exam have resolved, no hepatic metastases are appreciated Cycle 51 5-FU/Avastin 09/11/2022 Cycle 52 5-FU/Avastin 10/02/2022 Cycle 53 5-FU/Avastin 10/23/2022 Cycle 54 5-FU/Avastin 11/20/2021 Cycle 55 5-FU/Avastin 12/11/2022 Cycle 56 5-FU/Avastin 01/08/2023     Atrial fibrillation with rapid ventricular response 05/26/2020 Mild neutropenia following cycle 3 FOLFOXIRI, Udenyca added with cycle 4 Hypokalemia secondary to diarrhea-potassium supplementation starting 09/13/2020 Oxaliplatin neuropathy-moderate loss of vibratory sense on exam 11/22/2020, oxaliplatin held with cycle 10, cycle 11, 12, 13 chemotherapy, improved        Disposition: Kathy Robinson appears stable.  She continues to tolerate the 5-FU and Avastin well.  There is no clinical evidence for progression of  the metastatic colon cancer.  She will complete another cycle of 5-FU/Avastin today.  Kathy Robinson will return for an office visit and 5-FU/Avastin in 1 month.  She will be scheduled for restaging CTs in April.  [name], MD  01/08/2023  9:56 AM

## 2023-01-08 NOTE — Progress Notes (Signed)
Patient seen by Dr. Benay Spice today  Vitals are within treatment parameters.  Labs reviewed by Dr. Benay Spice and are within treatment parameters.UA protein not needed today.  Per physician team, patient is ready for treatment and there are NO modifications to the treatment plan.

## 2023-01-08 NOTE — Patient Instructions (Addendum)
[facility]   Discharge Instructions: Thank you for choosing [facility] to provide your oncology and hematology care.   If you have a lab appointment with the [facility], please go directly to the [facility] and check in at the registration area.   Wear comfortable clothing and clothing appropriate for easy access to any Portacath or PICC line.   We strive to give you quality time with your provider. You may need to reschedule your appointment if you arrive late (15 or more minutes).  Arriving late affects you and other patients whose appointments are after yours.  Also, if you miss three or more appointments without notifying the office, you may be dismissed from the clinic at the provider's discretion.      For prescription refill requests, have your pharmacy contact our office and allow 72 hours for refills to be completed.    Today you received the following chemotherapy and/or immunotherapy agents Bevacizumab-bvzr (ZIRABEV) & Flourouracil (ADRUCIL).      To help prevent nausea and vomiting after your treatment, we encourage you to take your nausea medication as directed.  BELOW ARE SYMPTOMS THAT SHOULD BE REPORTED IMMEDIATELY: *FEVER GREATER THAN 100.4 F (38 C) OR HIGHER *CHILLS OR SWEATING *NAUSEA AND VOMITING THAT IS NOT CONTROLLED WITH YOUR NAUSEA MEDICATION *UNUSUAL SHORTNESS OF BREATH *UNUSUAL BRUISING OR BLEEDING *URINARY PROBLEMS (pain or burning when urinating, or frequent urination) *BOWEL PROBLEMS (unusual diarrhea, constipation, pain near the anus) TENDERNESS IN MOUTH AND THROAT WITH OR WITHOUT PRESENCE OF ULCERS (sore throat, sores in mouth, or a toothache) UNUSUAL RASH, SWELLING OR PAIN  UNUSUAL VAGINAL DISCHARGE OR ITCHING   Items with * indicate a potential emergency and should be followed up as soon as possible or go to the Emergency Department if any problems should occur.  Please show the CHEMOTHERAPY ALERT  CARD or IMMUNOTHERAPY ALERT CARD at check-in to the Emergency Department and triage nurse.  Should you have questions after your visit or need to cancel or reschedule your appointment, please contact [facility]  Dept: [phone]  and follow the prompts.  Office hours are 8:00 a.m. to 4:30 p.m. Monday - Friday. Please note that voicemails left after 4:00 p.m. may not be returned until the following business day.  We are closed weekends and major holidays. You have access to a nurse at all times for urgent questions. Please call the main number to the clinic Dept: [phone] and follow the prompts.   For any non-urgent questions, you may also contact your provider using MyChart. We now offer e-Visits for anyone 74 and older to request care online for non-urgent symptoms. For details visit [email or web address].   Also download the MyChart app! Go to the app store, search "MyChart", open the app, select [facility], and log in with your MyChart username and password.  Bevacizumab Injection What is this medication? BEVACIZUMAB (be va SIZ yoo mab) treats some types of cancer. It works by blocking a protein that causes cancer cells to grow and multiply. This helps to slow or stop the spread of cancer cells. It is a monoclonal antibody. This medicine may be used for other purposes; ask your health care provider or pharmacist if you have questions. COMMON BRAND NAME(S): Alymsys, Avastin, MVASI, [name] What should I tell my care team before I take this medication? They need to know if you have any of these conditions: Blood clots Coughing up blood Having or  recent surgery Heart failure High blood pressure History of a connection between 2 or more body parts that do not usually connect (fistula) History of a tear in your stomach or intestines Protein in your urine An unusual or allergic reaction to bevacizumab, other medications, foods, dyes, or  preservatives Pregnant or trying to get pregnant Breast-feeding How should I use this medication? This medication is injected into a vein. It is given by your care team in a hospital or clinic setting. Talk to your care team the use of this medication in children. Special care may be needed. Overdosage: If you think you have taken too much of this medicine contact a poison control center or emergency room at once. NOTE: This medicine is only for you. Do not share this medicine with others. What if I miss a dose? Keep appointments for follow-up doses. It is important not to miss your dose. Call your care team if you are unable to keep an appointment. What may interact with this medication? Interactions are not expected. This list may not describe all possible interactions. Give your health care provider a list of all the medicines, herbs, non-prescription drugs, or dietary supplements you use. Also tell them if you smoke, drink alcohol, or use illegal drugs. Some items may interact with your medicine. What should I watch for while using this medication? Your condition will be monitored carefully while you are receiving this medication. You may need blood work while taking this medication. This medication may make you feel generally unwell. This is not uncommon as chemotherapy can affect healthy cells as well as cancer cells. Report any side effects. Continue your course of treatment even though you feel ill unless your care team tells you to stop. This medication may increase your risk to bruise or bleed. Call your care team if you notice any unusual bleeding. Before having surgery, talk to your care team to make sure it is ok. This medication can increase the risk of poor healing of your surgical site or wound. You will need to stop this medication for 28 days before surgery. After surgery, wait at least 28 days before restarting this medication. Make sure the surgical site or wound is healed enough  before restarting this medication. Talk to your care team if questions. Talk to your care team if you may be pregnant. Serious birth defects can occur if you take this medication during pregnancy and for 6 months after the last dose. Contraception is recommended while taking this medication and for 6 months after the last dose. Your care team can help you find the option that works for you. Do not breastfeed while taking this medication and for 6 months after the last dose. This medication can cause infertility. Talk to your care team if you are concerned about your fertility. What side effects may I notice from receiving this medication? Side effects that you should report to your care team as soon as possible: Allergic reactions--skin rash, itching, hives, swelling of the face, lips, tongue, or throat Bleeding--bloody or black, tar-like stools, vomiting blood or brown material that looks like coffee grounds, red or dark brown urine, small red or purple spots on skin, unusual bruising or bleeding Blood clot--pain, swelling, or warmth in the leg, shortness of breath, chest pain Heart attack--pain or tightness in the chest, shoulders, arms, or jaw, nausea, shortness of breath, cold or clammy skin, feeling faint or lightheaded Heart failure--shortness of breath, swelling of the ankles, feet, or hands, sudden weight gain,  unusual weakness or fatigue Increase in blood pressure Infection--fever, chills, cough, sore throat, wounds that don't heal, pain or trouble when passing urine, general feeling of discomfort or being unwell Infusion reactions--chest pain, shortness of breath or trouble breathing, feeling faint or lightheaded Kidney injury--decrease in the amount of urine, swelling of the ankles, hands, or feet Stomach pain that is severe, does not go away, or gets worse Stroke--sudden numbness or weakness of the face, arm, or leg, trouble speaking, confusion, trouble walking, loss of balance or  coordination, dizziness, severe headache, change in vision Sudden and severe headache, confusion, change in vision, seizures, which may be signs of posterior reversible encephalopathy syndrome (PRES) Side effects that usually do not require medical attention (report to your care team if they continue or are bothersome): Back pain Change in taste Diarrhea Dry skin Increased tears Nosebleed This list may not describe all possible side effects. Call your doctor for medical advice about side effects. You may report side effects to FDA at 1-800-FDA-1088. Where should I keep my medication? This medication is given in a hospital or clinic. It will not be stored at home. NOTE: This sheet is a summary. It may not cover all possible information. If you have questions about this medicine, talk to your doctor, pharmacist, or health care provider.  2023 Elsevier/Gold Standard (2022-03-09 00:00:00)  Fluorouracil Injection What is this medication? FLUOROURACIL (flure oh YOOR a sil) treats some types of cancer. It works by slowing down the growth of cancer cells. This medicine may be used for other purposes; ask your health care provider or pharmacist if you have questions. COMMON BRAND NAME(S): Adrucil What should I tell my care team before I take this medication? They need to know if you have any of these conditions: Blood disorders Dihydropyrimidine dehydrogenase (DPD) deficiency Infection, such as chickenpox, cold sores, herpes Kidney disease Liver disease Poor nutrition Recent or ongoing radiation therapy An unusual or allergic reaction to fluorouracil, other medications, foods, dyes, or preservatives If you or your partner are pregnant or trying to get pregnant Breast-feeding How should I use this medication? This medication is injected into a vein. It is administered by your care team in a hospital or clinic setting. Talk to your care team about the use of this medication in children.  Special care may be needed. Overdosage: If you think you have taken too much of this medicine contact a poison control center or emergency room at once. NOTE: This medicine is only for you. Do not share this medicine with others. What if I miss a dose? Keep appointments for follow-up doses. It is important not to miss your dose. Call your care team if you are unable to keep an appointment. What may interact with this medication? Do not take this medication with any of the following: Live virus vaccines This medication may also interact with the following: Medications that treat or prevent blood clots, such as warfarin, enoxaparin, dalteparin This list may not describe all possible interactions. Give your health care provider a list of all the medicines, herbs, non-prescription drugs, or dietary supplements you use. Also tell them if you smoke, drink alcohol, or use illegal drugs. Some items may interact with your medicine. What should I watch for while using this medication? Your condition will be monitored carefully while you are receiving this medication. This medication may make you feel generally unwell. This is not uncommon as chemotherapy can affect healthy cells as well as cancer cells. Report any side effects. Continue  your course of treatment even though you feel ill unless your care team tells you to stop. In some cases, you may be given additional medications to help with side effects. Follow all directions for their use. This medication may increase your risk of getting an infection. Call your care team for advice if you get a fever, chills, sore throat, or other symptoms of a cold or flu. Do not treat yourself. Try to avoid being around people who are sick. This medication may increase your risk to bruise or bleed. Call your care team if you notice any unusual bleeding. Be careful brushing or flossing your teeth or using a toothpick because you may get an infection or bleed more easily.  If you have any dental work done, tell your dentist you are receiving this medication. Avoid taking medications that contain aspirin, acetaminophen, ibuprofen, naproxen, or ketoprofen unless instructed by your care team. These medications may hide a fever. Do not treat diarrhea with over the counter products. Contact your care team if you have diarrhea that lasts more than 2 days or if it is severe and watery. This medication can make you more sensitive to the sun. Keep out of the sun. If you cannot avoid being in the sun, wear protective clothing and sunscreen. Do not use sun lamps, tanning beds, or tanning booths. Talk to your care team if you or your partner wish to become pregnant or think you might be pregnant. This medication can cause serious birth defects if taken during pregnancy and for 3 months after the last dose. A reliable form of contraception is recommended while taking this medication and for 3 months after the last dose. Talk to your care team about effective forms of contraception. Do not father a child while taking this medication and for 3 months after the last dose. Use a condom while having sex during this time period. Do not breastfeed while taking this medication. This medication may cause infertility. Talk to your care team if you are concerned about your fertility. What side effects may I notice from receiving this medication? Side effects that you should report to your care team as soon as possible: Allergic reactions--skin rash, itching, hives, swelling of the face, lips, tongue, or throat Heart attack--pain or tightness in the chest, shoulders, arms, or jaw, nausea, shortness of breath, cold or clammy skin, feeling faint or lightheaded Heart failure--shortness of breath, swelling of the ankles, feet, or hands, sudden weight gain, unusual weakness or fatigue Heart rhythm changes--fast or irregular heartbeat, dizziness, feeling faint or lightheaded, chest pain, trouble  breathing High ammonia level--unusual weakness or fatigue, confusion, loss of appetite, nausea, vomiting, seizures Infection--fever, chills, cough, sore throat, wounds that don't heal, pain or trouble when passing urine, general feeling of discomfort or being unwell Low red blood cell level--unusual weakness or fatigue, dizziness, headache, trouble breathing Pain, tingling, or numbness in the hands or feet, muscle weakness, change in vision, confusion or trouble speaking, loss of balance or coordination, trouble walking, seizures Redness, swelling, and blistering of the skin over hands and feet Severe or prolonged diarrhea Unusual bruising or bleeding Side effects that usually do not require medical attention (report to your care team if they continue or are bothersome): Dry skin Headache Increased tears Nausea Pain, redness, or swelling with sores inside the mouth or throat Sensitivity to light Vomiting This list may not describe all possible side effects. Call your doctor for medical advice about side effects. You may report side effects to  FDA at 1-800-FDA-1088. Where should I keep my medication? This medication is given in a hospital or clinic. It will not be stored at home. NOTE: This sheet is a summary. It may not cover all possible information. If you have questions about this medicine, talk to your doctor, pharmacist, or health care provider.  2023 Elsevier/Gold Standard (2022-03-06 00:00:00)  The chemotherapy medication bag should finish at 46 hours, 96 hours, or 7 days. For example, if your pump is scheduled for 46 hours and it was put on at 4:00 p.m., it should finish at 2:00 p.m. the day it is scheduled to come off regardless of your appointment time.     Estimated time to finish at 9:30am on Thursday 01/10/2023.   If the display on your pump reads "Low Volume" and it is beeping, take the batteries out of the pump and come to the cancer center for it to be taken off.   If the  pump alarms go off prior to the pump reading "Low Volume" then call [phone] and someone can assist you.  If the plunger comes out and the chemotherapy medication is leaking out, please use your home chemo spill kit to clean up the spill. Do NOT use paper towels or other household products.  If you have problems or questions regarding your pump, please call either [phone] (24 hours a day) or the cancer center Monday-Friday 8:00 a.m.- 4:30 p.m. at the clinic number and we will assist you. If you are unable to get assistance, then go to the nearest Emergency Department and ask the staff to contact the IV team for assistance.

## 2023-01-09 ENCOUNTER — Other Ambulatory Visit: Payer: Self-pay

## 2023-01-10 ENCOUNTER — Inpatient Hospital Stay: Payer: Medicare Other

## 2023-01-10 VITALS — BP 132/76 | HR 85 | Temp 96.9°F | Resp 18

## 2023-01-10 DIAGNOSIS — E876 Hypokalemia: Secondary | ICD-10-CM | POA: Diagnosis not present

## 2023-01-10 DIAGNOSIS — C186 Malignant neoplasm of descending colon: Secondary | ICD-10-CM | POA: Diagnosis not present

## 2023-01-10 DIAGNOSIS — I4891 Unspecified atrial fibrillation: Secondary | ICD-10-CM | POA: Diagnosis not present

## 2023-01-10 DIAGNOSIS — C185 Malignant neoplasm of splenic flexure: Secondary | ICD-10-CM

## 2023-01-10 DIAGNOSIS — R197 Diarrhea, unspecified: Secondary | ICD-10-CM | POA: Diagnosis not present

## 2023-01-10 DIAGNOSIS — C787 Secondary malignant neoplasm of liver and intrahepatic bile duct: Secondary | ICD-10-CM | POA: Diagnosis not present

## 2023-01-10 DIAGNOSIS — Z5111 Encounter for antineoplastic chemotherapy: Secondary | ICD-10-CM | POA: Diagnosis not present

## 2023-01-10 DIAGNOSIS — R599 Enlarged lymph nodes, unspecified: Secondary | ICD-10-CM | POA: Diagnosis not present

## 2023-01-10 MED ORDER — SODIUM CHLORIDE 0.9% FLUSH
10.0000 mL | INTRAVENOUS | Status: DC | PRN
  Administered 2023-01-10: 10 mL

## 2023-01-10 MED ORDER — HEPARIN SOD (PORK) LOCK FLUSH 100 UNIT/ML IV SOLN
500.0000 [IU] | Freq: Once | INTRAVENOUS | Status: AC | PRN
  Administered 2023-01-10: 500 [IU]

## 2023-01-10 NOTE — Patient Instructions (Signed)

## 2023-01-21 DIAGNOSIS — Z961 Presence of intraocular lens: Secondary | ICD-10-CM | POA: Diagnosis not present

## 2023-02-05 ENCOUNTER — Inpatient Hospital Stay: Payer: Medicare Other | Attending: Oncology

## 2023-02-05 ENCOUNTER — Inpatient Hospital Stay: Payer: Medicare Other

## 2023-02-05 ENCOUNTER — Inpatient Hospital Stay: Payer: Medicare Other | Admitting: Nurse Practitioner

## 2023-02-05 ENCOUNTER — Other Ambulatory Visit (HOSPITAL_COMMUNITY): Payer: Self-pay

## 2023-02-05 ENCOUNTER — Encounter: Payer: Self-pay | Admitting: Nurse Practitioner

## 2023-02-05 VITALS — BP 142/72 | HR 80 | Temp 98.2°F | Resp 18 | Ht 66.0 in | Wt 157.0 lb

## 2023-02-05 VITALS — BP 149/81 | HR 59 | Temp 97.9°F

## 2023-02-05 DIAGNOSIS — D709 Neutropenia, unspecified: Secondary | ICD-10-CM | POA: Insufficient documentation

## 2023-02-05 DIAGNOSIS — C186 Malignant neoplasm of descending colon: Secondary | ICD-10-CM | POA: Insufficient documentation

## 2023-02-05 DIAGNOSIS — I4891 Unspecified atrial fibrillation: Secondary | ICD-10-CM | POA: Diagnosis not present

## 2023-02-05 DIAGNOSIS — G629 Polyneuropathy, unspecified: Secondary | ICD-10-CM | POA: Diagnosis not present

## 2023-02-05 DIAGNOSIS — E876 Hypokalemia: Secondary | ICD-10-CM | POA: Diagnosis not present

## 2023-02-05 DIAGNOSIS — C185 Malignant neoplasm of splenic flexure: Secondary | ICD-10-CM

## 2023-02-05 DIAGNOSIS — C787 Secondary malignant neoplasm of liver and intrahepatic bile duct: Secondary | ICD-10-CM | POA: Insufficient documentation

## 2023-02-05 DIAGNOSIS — Z5111 Encounter for antineoplastic chemotherapy: Secondary | ICD-10-CM | POA: Insufficient documentation

## 2023-02-05 LAB — CBC WITH DIFFERENTIAL (CANCER CENTER ONLY)
Abs Immature Granulocytes: 0.01 10*3/uL (ref 0.00–0.07)
Basophils Absolute: 0.1 10*3/uL (ref 0.0–0.1)
Basophils Relative: 1 %
Eosinophils Absolute: 0.2 10*3/uL (ref 0.0–0.5)
Eosinophils Relative: 4 %
HCT: 39.3 % (ref 36.0–46.0)
Hemoglobin: 13.2 g/dL (ref 12.0–15.0)
Immature Granulocytes: 0 %
Lymphocytes Relative: 34 %
Lymphs Abs: 1.8 10*3/uL (ref 0.7–4.0)
MCH: 35.3 pg — ABNORMAL HIGH (ref 26.0–34.0)
MCHC: 33.6 g/dL (ref 30.0–36.0)
MCV: 105.1 fL — ABNORMAL HIGH (ref 80.0–100.0)
Monocytes Absolute: 0.5 10*3/uL (ref 0.1–1.0)
Monocytes Relative: 8 %
Neutro Abs: 2.8 10*3/uL (ref 1.7–7.7)
Neutrophils Relative %: 53 %
Platelet Count: 219 10*3/uL (ref 150–400)
RBC: 3.74 MIL/uL — ABNORMAL LOW (ref 3.87–5.11)
RDW: 13.2 % (ref 11.5–15.5)
WBC Count: 5.4 10*3/uL (ref 4.0–10.5)
nRBC: 0 % (ref 0.0–0.2)

## 2023-02-05 LAB — CMP (CANCER CENTER ONLY)
ALT: 20 U/L (ref 0–44)
AST: 26 U/L (ref 15–41)
Albumin: 4 g/dL (ref 3.5–5.0)
Alkaline Phosphatase: 61 U/L (ref 38–126)
Anion gap: 5 (ref 5–15)
BUN: 30 mg/dL — ABNORMAL HIGH (ref 8–23)
CO2: 28 mmol/L (ref 22–32)
Calcium: 9.7 mg/dL (ref 8.9–10.3)
Chloride: 105 mmol/L (ref 98–111)
Creatinine: 1.06 mg/dL — ABNORMAL HIGH (ref 0.44–1.00)
GFR, Estimated: 53 mL/min — ABNORMAL LOW
Glucose, Bld: 70 mg/dL (ref 70–99)
Potassium: 4.4 mmol/L (ref 3.5–5.1)
Sodium: 138 mmol/L (ref 135–145)
Total Bilirubin: 0.4 mg/dL (ref 0.3–1.2)
Total Protein: 6.9 g/dL (ref 6.5–8.1)

## 2023-02-05 LAB — TOTAL PROTEIN, URINE DIPSTICK: Protein, ur: 30 mg/dL — AB

## 2023-02-05 LAB — CEA (ACCESS): CEA (CHCC): 1.21 ng/mL (ref 0.00–5.00)

## 2023-02-05 MED ORDER — SODIUM CHLORIDE 0.9 % IV SOLN
7.5000 mg/kg | Freq: Once | INTRAVENOUS | Status: AC
  Administered 2023-02-05: 500 mg via INTRAVENOUS
  Filled 2023-02-05: qty 16

## 2023-02-05 MED ORDER — SODIUM CHLORIDE 0.9 % IV SOLN: Freq: Once | INTRAVENOUS | Status: AC

## 2023-02-05 MED ORDER — SODIUM CHLORIDE 0.9 % IV SOLN
1600.0000 mg/m2 | INTRAVENOUS | Status: DC
  Administered 2023-02-05: 2900 mg via INTRAVENOUS
  Filled 2023-02-05: qty 8

## 2023-02-05 MED ORDER — POTASSIUM CHLORIDE ER 10 MEQ PO CPCR
10.0000 meq | ORAL_CAPSULE | Freq: Two times a day (BID) | ORAL | 2 refills | Status: DC
  Filled 2023-02-05: qty 60, fill #0
  Filled 2023-02-28: qty 60, 30d supply, fill #0

## 2023-02-05 NOTE — Patient Instructions (Addendum)
[place]   The chemotherapy medication bag should finish at 46 hours, 96 hours, or 7 days. For example, if your pump is scheduled for 46 hours and it was put on at 4:00 p.m., it should finish at 2:00 p.m. the day it is scheduled to come off regardless of your appointment time.     Estimated time to finish at 10:00 Thursday, February 07, 2023.   If the display on your pump reads "Low Volume" and it is beeping, take the batteries out of the pump and come to the cancer center for it to be taken off.   If the pump alarms go off prior to the pump reading "Low Volume" then call [phone] and someone can assist you.  If the plunger comes out and the chemotherapy medication is leaking out, please use your home chemo spill kit to clean up the spill. Do NOT use paper towels or other household products.  If you have problems or questions regarding your pump, please call either [phone] (24 hours a day) or the cancer center Monday-Friday 8:00 a.m.- 4:30 p.m. at the clinic number and we will assist you. If you are unable to get assistance, then go to the nearest Emergency Department and ask the staff to contact the IV team for assistance.  Discharge Instructions: Thank you for choosing [facility] to provide your oncology and hematology care.   If you have a lab appointment with the [facility], please go directly to the [facility] and check in at the registration area.   Wear comfortable clothing and clothing appropriate for easy access to any Portacath or PICC line.   We strive to give you quality time with your provider. You may need to reschedule your appointment if you arrive late (15 or more minutes).  Arriving late affects you and other patients whose appointments are after yours.  Also, if you miss three or more appointments without notifying the office, you may be dismissed from the clinic at the provider's discretion.      For  prescription refill requests, have your pharmacy contact our office and allow 72 hours for refills to be completed.    Today you received the following chemotherapy and/or immunotherapy agents Bevacizumab, Fluorouracil.      To help prevent nausea and vomiting after your treatment, we encourage you to take your nausea medication as directed.  BELOW ARE SYMPTOMS THAT SHOULD BE REPORTED IMMEDIATELY: *FEVER GREATER THAN 100.4 F (38 C) OR HIGHER *CHILLS OR SWEATING *NAUSEA AND VOMITING THAT IS NOT CONTROLLED WITH YOUR NAUSEA MEDICATION *UNUSUAL SHORTNESS OF BREATH *UNUSUAL BRUISING OR BLEEDING *URINARY PROBLEMS (pain or burning when urinating, or frequent urination) *BOWEL PROBLEMS (unusual diarrhea, constipation, pain near the anus) TENDERNESS IN MOUTH AND THROAT WITH OR WITHOUT PRESENCE OF ULCERS (sore throat, sores in mouth, or a toothache) UNUSUAL RASH, SWELLING OR PAIN  UNUSUAL VAGINAL DISCHARGE OR ITCHING   Items with * indicate a potential emergency and should be followed up as soon as possible or go to the Emergency Department if any problems should occur.  Please show the CHEMOTHERAPY ALERT CARD or IMMUNOTHERAPY ALERT CARD at check-in to the Emergency Department and triage nurse.  Should you have questions after your visit or need to cancel or reschedule your appointment, please contact [facility]  Dept: [phone]  and follow the prompts.  Office hours are 8:00 a.m. to 4:30 p.m. Monday - Friday. Please note that  voicemails left after 4:00 p.m. may not be returned until the following business day.  We are closed weekends and major holidays. You have access to a nurse at all times for urgent questions. Please call the main number to the clinic Dept: [phone] and follow the prompts.   For any non-urgent questions, you may also contact your provider using MyChart. We now offer e-Visits for anyone 71 and older to request care online for non-urgent  symptoms. For details visit [email or web address].   Also download the MyChart app! Go to the app store, search "MyChart", open the app, select [facility], and log in with your MyChart username and password.  Bevacizumab Injection What is this medication? BEVACIZUMAB (be va SIZ yoo mab) treats some types of cancer. It works by blocking a protein that causes cancer cells to grow and multiply. This helps to slow or stop the spread of cancer cells. It is a monoclonal antibody. This medicine may be used for other purposes; ask your health care provider or pharmacist if you have questions. COMMON BRAND NAME(S): Alymsys, Avastin, MVASI, [name] What should I tell my care team before I take this medication? They need to know if you have any of these conditions: Blood clots Coughing up blood Having or recent surgery Heart failure High blood pressure History of a connection between 2 or more body parts that do not usually connect (fistula) History of a tear in your stomach or intestines Protein in your urine An unusual or allergic reaction to bevacizumab, other medications, foods, dyes, or preservatives Pregnant or trying to get pregnant Breast-feeding How should I use this medication? This medication is injected into a vein. It is given by your care team in a hospital or clinic setting. Talk to your care team the use of this medication in children. Special care may be needed. Overdosage: If you think you have taken too much of this medicine contact a poison control center or emergency room at once. NOTE: This medicine is only for you. Do not share this medicine with others. What if I miss a dose? Keep appointments for follow-up doses. It is important not to miss your dose. Call your care team if you are unable to keep an appointment. What may interact with this medication? Interactions are not expected. This list may not describe all possible interactions. Give your health care provider a  list of all the medicines, herbs, non-prescription drugs, or dietary supplements you use. Also tell them if you smoke, drink alcohol, or use illegal drugs. Some items may interact with your medicine. What should I watch for while using this medication? Your condition will be monitored carefully while you are receiving this medication. You may need blood work while taking this medication. This medication may make you feel generally unwell. This is not uncommon as chemotherapy can affect healthy cells as well as cancer cells. Report any side effects. Continue your course of treatment even though you feel ill unless your care team tells you to stop. This medication may increase your risk to bruise or bleed. Call your care team if you notice any unusual bleeding. Before having surgery, talk to your care team to make sure it is ok. This medication can increase the risk of poor healing of your surgical site or wound. You will need to stop this medication for 28 days before surgery. After surgery, wait at least 28 days before restarting this medication. Make sure the surgical site or wound is healed enough before restarting this medication.  Talk to your care team if questions. Talk to your care team if you may be pregnant. Serious birth defects can occur if you take this medication during pregnancy and for 6 months after the last dose. Contraception is recommended while taking this medication and for 6 months after the last dose. Your care team can help you find the option that works for you. Do not breastfeed while taking this medication and for 6 months after the last dose. This medication can cause infertility. Talk to your care team if you are concerned about your fertility. What side effects may I notice from receiving this medication? Side effects that you should report to your care team as soon as possible: Allergic reactions--skin rash, itching, hives, swelling of the face, lips, tongue, or  throat Bleeding--bloody or black, tar-like stools, vomiting blood or brown material that looks like coffee grounds, red or dark brown urine, small red or purple spots on skin, unusual bruising or bleeding Blood clot--pain, swelling, or warmth in the leg, shortness of breath, chest pain Heart attack--pain or tightness in the chest, shoulders, arms, or jaw, nausea, shortness of breath, cold or clammy skin, feeling faint or lightheaded Heart failure--shortness of breath, swelling of the ankles, feet, or hands, sudden weight gain, unusual weakness or fatigue Increase in blood pressure Infection--fever, chills, cough, sore throat, wounds that don't heal, pain or trouble when passing urine, general feeling of discomfort or being unwell Infusion reactions--chest pain, shortness of breath or trouble breathing, feeling faint or lightheaded Kidney injury--decrease in the amount of urine, swelling of the ankles, hands, or feet Stomach pain that is severe, does not go away, or gets worse Stroke--sudden numbness or weakness of the face, arm, or leg, trouble speaking, confusion, trouble walking, loss of balance or coordination, dizziness, severe headache, change in vision Sudden and severe headache, confusion, change in vision, seizures, which may be signs of posterior reversible encephalopathy syndrome (PRES) Side effects that usually do not require medical attention (report to your care team if they continue or are bothersome): Back pain Change in taste Diarrhea Dry skin Increased tears Nosebleed This list may not describe all possible side effects. Call your doctor for medical advice about side effects. You may report side effects to FDA at 1-800-FDA-1088. Where should I keep my medication? This medication is given in a hospital or clinic. It will not be stored at home. NOTE: This sheet is a summary. It may not cover all possible information. If you have questions about this medicine, talk to your doctor,  pharmacist, or health care provider.  2023 Elsevier/Gold Standard (2022-03-09 00:00:00)  Fluorouracil Injection What is this medication? FLUOROURACIL (flure oh YOOR a sil) treats some types of cancer. It works by slowing down the growth of cancer cells. This medicine may be used for other purposes; ask your health care provider or pharmacist if you have questions. COMMON BRAND NAME(S): Adrucil What should I tell my care team before I take this medication? They need to know if you have any of these conditions: Blood disorders Dihydropyrimidine dehydrogenase (DPD) deficiency Infection, such as chickenpox, cold sores, herpes Kidney disease Liver disease Poor nutrition Recent or ongoing radiation therapy An unusual or allergic reaction to fluorouracil, other medications, foods, dyes, or preservatives If you or your partner are pregnant or trying to get pregnant Breast-feeding How should I use this medication? This medication is injected into a vein. It is administered by your care team in a hospital or clinic setting. Talk to your care  team about the use of this medication in children. Special care may be needed. Overdosage: If you think you have taken too much of this medicine contact a poison control center or emergency room at once. NOTE: This medicine is only for you. Do not share this medicine with others. What if I miss a dose? Keep appointments for follow-up doses. It is important not to miss your dose. Call your care team if you are unable to keep an appointment. What may interact with this medication? Do not take this medication with any of the following: Live virus vaccines This medication may also interact with the following: Medications that treat or prevent blood clots, such as warfarin, enoxaparin, dalteparin This list may not describe all possible interactions. Give your health care provider a list of all the medicines, herbs, non-prescription drugs, or dietary supplements  you use. Also tell them if you smoke, drink alcohol, or use illegal drugs. Some items may interact with your medicine. What should I watch for while using this medication? Your condition will be monitored carefully while you are receiving this medication. This medication may make you feel generally unwell. This is not uncommon as chemotherapy can affect healthy cells as well as cancer cells. Report any side effects. Continue your course of treatment even though you feel ill unless your care team tells you to stop. In some cases, you may be given additional medications to help with side effects. Follow all directions for their use. This medication may increase your risk of getting an infection. Call your care team for advice if you get a fever, chills, sore throat, or other symptoms of a cold or flu. Do not treat yourself. Try to avoid being around people who are sick. This medication may increase your risk to bruise or bleed. Call your care team if you notice any unusual bleeding. Be careful brushing or flossing your teeth or using a toothpick because you may get an infection or bleed more easily. If you have any dental work done, tell your dentist you are receiving this medication. Avoid taking medications that contain aspirin, acetaminophen, ibuprofen, naproxen, or ketoprofen unless instructed by your care team. These medications may hide a fever. Do not treat diarrhea with over the counter products. Contact your care team if you have diarrhea that lasts more than 2 days or if it is severe and watery. This medication can make you more sensitive to the sun. Keep out of the sun. If you cannot avoid being in the sun, wear protective clothing and sunscreen. Do not use sun lamps, tanning beds, or tanning booths. Talk to your care team if you or your partner wish to become pregnant or think you might be pregnant. This medication can cause serious birth defects if taken during pregnancy and for 3 months after  the last dose. A reliable form of contraception is recommended while taking this medication and for 3 months after the last dose. Talk to your care team about effective forms of contraception. Do not father a child while taking this medication and for 3 months after the last dose. Use a condom while having sex during this time period. Do not breastfeed while taking this medication. This medication may cause infertility. Talk to your care team if you are concerned about your fertility. What side effects may I notice from receiving this medication? Side effects that you should report to your care team as soon as possible: Allergic reactions--skin rash, itching, hives, swelling of the face, lips, tongue,  or throat Heart attack--pain or tightness in the chest, shoulders, arms, or jaw, nausea, shortness of breath, cold or clammy skin, feeling faint or lightheaded Heart failure--shortness of breath, swelling of the ankles, feet, or hands, sudden weight gain, unusual weakness or fatigue Heart rhythm changes--fast or irregular heartbeat, dizziness, feeling faint or lightheaded, chest pain, trouble breathing High ammonia level--unusual weakness or fatigue, confusion, loss of appetite, nausea, vomiting, seizures Infection--fever, chills, cough, sore throat, wounds that don't heal, pain or trouble when passing urine, general feeling of discomfort or being unwell Low red blood cell level--unusual weakness or fatigue, dizziness, headache, trouble breathing Pain, tingling, or numbness in the hands or feet, muscle weakness, change in vision, confusion or trouble speaking, loss of balance or coordination, trouble walking, seizures Redness, swelling, and blistering of the skin over hands and feet Severe or prolonged diarrhea Unusual bruising or bleeding Side effects that usually do not require medical attention (report to your care team if they continue or are bothersome): Dry skin Headache Increased  tears Nausea Pain, redness, or swelling with sores inside the mouth or throat Sensitivity to light Vomiting This list may not describe all possible side effects. Call your doctor for medical advice about side effects. You may report side effects to FDA at 1-800-FDA-1088. Where should I keep my medication? This medication is given in a hospital or clinic. It will not be stored at home. NOTE: This sheet is a summary. It may not cover all possible information. If you have questions about this medicine, talk to your doctor, pharmacist, or health care provider.  2023 Elsevier/Gold Standard (2022-03-06 00:00:00)

## 2023-02-05 NOTE — Progress Notes (Signed)
[facility] OFFICE PROGRESS NOTE   Diagnosis: Colon cancer  INTERVAL HISTORY:   Kathy Robinson returns as scheduled.  She completed a cycle of 5-FU/bevacizumab 01/08/2023.  She feels well.  No nausea or vomiting.  No mouth sores.  No diarrhea.  No bleeding.  She denies abdominal pain.  Hands with very mild numbness/tingling in the fingertips.  Neuropathy symptoms involving feet unchanged.  Since her last visit she had a "bad cold".  Symptoms have completely resolved.  She reports testing negative for COVID.  Objective:  Vital signs in last 24 hours:  Blood pressure (!) 142/72, pulse 80, temperature 98.2 F (36.8 C), temperature source Oral, resp. rate 18, height 5\' 6"  (1.676 m), weight 157 lb (71.2 kg), last menstrual period 11/19/1996, SpO2 99 %.    HEENT: No thrush or ulcers. Resp: Lungs clear bilaterally. Cardio: Regular rate and rhythm. GI: Abdomen soft and nontender.  No hepatosplenomegaly. Vascular: No leg edema. Port-A-Cath without erythema.  Lab Results:  Lab Results  Component Value Date   WBC 5.4 02/05/2023   HGB 13.2 02/05/2023   HCT 39.3 02/05/2023   MCV 105.1 (H) 02/05/2023   PLT 219 02/05/2023   NEUTROABS 2.8 02/05/2023    Imaging:  No results found.  Medications: I have reviewed the patient's current medications.  Assessment/Plan: Adenocarcinoma the left colon, stage IIIc (pT4b,pN2a), status post a left colectomy 05/30/2020, MSS, TMB 13, BRAF V600E Tumor invades the visceral peritoneum, lymphovascular and perineural invasion present, for tumor deposits, 7/13 lymph nodes, negative resection margins, no loss of mismatch repair protein expression CT abdomen/pelvis 05/26/2020-long segment of masslike thickening of the descending colon with associated high-grade colonic obstruction, small colonic wall defect with evidence of a focally contained microperforation, retroperitoneal adenopathy, indeterminate small hypodense liver lesions Elevated  preoperative CEA PET 06/22/2020-hypermetabolic liver metastases, periportal and periaortic adenopathy, hypermetabolic mediastinal and left supraclavicular nodes Cycle 1 FOLFOX 07/05/20 Cycle 2 FOLFOXIRI (dose-reduced 5FU, no bolus) and Avastin 07/19/20  Cycle 3 FOLFOXIRI (Dose reduced 5-FU, no bolus)/Avastin 08/02/2020 Cycle 4 FOLFOXIRI/Avastin 08/16/2020 (Udenyca added) Cycle 5 FOLFOXIRI/Avastin 08/30/2020 CTs 09/09/2020-diminished size of lymph nodes in the chest, abdomen, and pelvis.  Decreased hepatic metastases.  No new evidence of disease progression, fat density lesion surrounding the left hemicolectomy Cycle 6 FOLFOXIRI/Avastin 09/13/2020 Cycle 7 FOLFOX/Avastin 09/27/2020 (irinotecan held) Cycle 8 FOLFOXIRI/Avastin 10/18/2020  Cycle 9 FOLFOXIRI/Avastin 11/01/2020 Cycle 10 FOLFOXIRI/Avastin 11/22/2020 (oxaliplatin held, irinotecan and 5-FU dose reduced) Cycle 11 FOLFOXIRI/Avastin 12/12/2020 (oxaliplatin held) CTs 12/19/2020-decrease in size of liver metastases, no evidence of disease progression Cycle 12 FOLFOXIRI/Avastin 01/04/2021 (oxaliplatin held) Cycle 13 FOLFOXIRI/Avastin 01/24/2021 (oxaliplatin held) Cycle 14 FOLFOXIRI/Avastin 02/14/2021 (oxaliplatin held) Cycle 15 FOLFOXIRI/Avastin 03/07/2021 (oxaliplatin held) Cycle 16 FOLFOXIRI/Avastin 03/29/2021 (oxaliplatin held) CTs 04/14/2021- decreased size of liver metastases.  No new or progressive findings. Cycle 17 FOLFOXIRI/Avastin 04/19/2021 (oxaliplatin held) Cycle 18 FOLFOXIRI/Avastin 05/09/2021 (oxaliplatin held) Cycle 19 FOLFOXIRI/Avastin 05/30/2021 (oxaliplatin held) Cycle 20 FOLFOXIRI/Avastin 06/20/2021 (oxaliplatin held) Cycle 21 FOLFOXIRI/Avastin 07/11/2021 (oxaliplatin held) CTs 07/28/2021-unchanged subcentimeter liver lesions, no evidence of new metastatic disease Cycle 22 5-FU/ Avastin 08/01/2021 Cycle 23 5-FU/Avastin 08/22/2021 Cycle 24 5-FU/Avastin 09/12/2021 Cycle 25 5-FU/Avastin 10/03/2021 Cycle 26 5-FU/Avastin 10/24/2021 Cycle 27  5-FU/Avastin 11/15/2021 Cycle 28 5-FU/Avastin 12/05/2021 CTs 12/22/2021-grossly similar subcentimeter low-attenuation lesions in the liver compatible with treated metastasis.  No evidence for new metastatic disease in the chest, abdomen or pelvis. Cycle 29 5-FU/Avastin 12/26/2021 Cycle 30 5-FU/Avastin 01/16/2022 Cycle 41 5-FU/Avastin 02/06/2022 Cycle 42 5-FU/Avastin 02/27/2022 Cycle 43 5-FU/Avastin 03/20/2022 Cycle 44 5-FU/Avastin 04/10/2022 CT  04/28/2022-interval development of a 3 mm nodule within the left lower lobe and 3 mm nodule within the right lower lobe, nonspecific.  Unchanged subcentimeter low-attenuation lesions in the liver compatible with treated metastasis. Cycle 45 5-FU/Avastin 05/01/2022 Cycle 46 5-FU/Avastin 05/29/2022 Cycle 47 5-FU/Avastin 06/19/2022 Cycle 48 5-FU/Avastin 07/10/2022 Cycle 49 5-FU/Avastin 07/31/2022 Cycle 50 5-FU/Avastin 08/21/2022 CTs 09/07/2022-no evidence of metastatic disease, tiny lung nodules described as new on previous exam have resolved, no hepatic metastases are appreciated Cycle 51 5-FU/Avastin 09/11/2022 Cycle 52 5-FU/Avastin 10/02/2022 Cycle 53 5-FU/Avastin 10/23/2022 Cycle 54 5-FU/Avastin 11/20/2021 Cycle 55 5-FU/Avastin 12/11/2022 Cycle 56 5-FU/Avastin 01/08/2023 Cycle 57 5-FU/Avastin 02/05/2023     Atrial fibrillation with rapid ventricular response 05/26/2020 Mild neutropenia following cycle 3 FOLFOXIRI, Udenyca added with cycle 4 Hypokalemia secondary to diarrhea-potassium supplementation starting 09/13/2020 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 appears stable.  She is on active treatment with 5-FU/Avastin every 4 weeks.  There is no clinical evidence of disease progression.  CEA is stable in normal range.  Plan to continue the same, treatment today.  Restaging CTs prior to next office visit.  CBC and chemistry panel  reviewed.  Labs adequate to proceed as above.  She will return for follow-up in 4 weeks.  We are available to see her sooner if needed.  Ned Card ANP/GNP-BC   02/05/2023  10:34 AM

## 2023-02-07 ENCOUNTER — Inpatient Hospital Stay: Payer: Medicare Other

## 2023-02-07 VITALS — BP 147/82 | HR 84 | Temp 97.3°F | Resp 18

## 2023-02-07 DIAGNOSIS — D709 Neutropenia, unspecified: Secondary | ICD-10-CM | POA: Diagnosis not present

## 2023-02-07 DIAGNOSIS — I4891 Unspecified atrial fibrillation: Secondary | ICD-10-CM | POA: Diagnosis not present

## 2023-02-07 DIAGNOSIS — E876 Hypokalemia: Secondary | ICD-10-CM | POA: Diagnosis not present

## 2023-02-07 DIAGNOSIS — C787 Secondary malignant neoplasm of liver and intrahepatic bile duct: Secondary | ICD-10-CM | POA: Diagnosis not present

## 2023-02-07 DIAGNOSIS — G629 Polyneuropathy, unspecified: Secondary | ICD-10-CM | POA: Diagnosis not present

## 2023-02-07 DIAGNOSIS — Z5111 Encounter for antineoplastic chemotherapy: Secondary | ICD-10-CM | POA: Diagnosis not present

## 2023-02-07 DIAGNOSIS — C186 Malignant neoplasm of descending colon: Secondary | ICD-10-CM | POA: Diagnosis not present

## 2023-02-07 DIAGNOSIS — C185 Malignant neoplasm of splenic flexure: Secondary | ICD-10-CM

## 2023-02-07 MED ORDER — SODIUM CHLORIDE 0.9% FLUSH
10.0000 mL | INTRAVENOUS | Status: DC | PRN
  Administered 2023-02-07: 10 mL

## 2023-02-07 MED ORDER — HEPARIN SOD (PORK) LOCK FLUSH 100 UNIT/ML IV SOLN
500.0000 [IU] | Freq: Once | INTRAVENOUS | Status: AC | PRN
  Administered 2023-02-07: 500 [IU]

## 2023-02-19 ENCOUNTER — Other Ambulatory Visit: Payer: Self-pay

## 2023-02-28 ENCOUNTER — Other Ambulatory Visit (HOSPITAL_COMMUNITY): Payer: Self-pay

## 2023-02-28 ENCOUNTER — Ambulatory Visit (HOSPITAL_COMMUNITY)
Admission: RE | Admit: 2023-02-28 | Discharge: 2023-02-28 | Disposition: A | Discharge status: Home / Self Care | Payer: Medicare Other | Source: Ambulatory Visit | Attending: Nurse Practitioner | Admitting: Nurse Practitioner

## 2023-02-28 DIAGNOSIS — C185 Malignant neoplasm of splenic flexure: Secondary | ICD-10-CM | POA: Diagnosis not present

## 2023-02-28 DIAGNOSIS — C189 Malignant neoplasm of colon, unspecified: Secondary | ICD-10-CM | POA: Diagnosis not present

## 2023-02-28 MED ORDER — IOHEXOL 300 MG/ML  SOLN
100.0000 mL | Freq: Once | INTRAMUSCULAR | Status: AC | PRN
  Administered 2023-02-28: 100 mL via INTRAVENOUS

## 2023-03-02 ENCOUNTER — Other Ambulatory Visit (HOSPITAL_COMMUNITY): Payer: Self-pay

## 2023-03-03 ENCOUNTER — Other Ambulatory Visit: Payer: Self-pay | Admitting: Oncology

## 2023-03-05 ENCOUNTER — Inpatient Hospital Stay: Payer: Medicare Other

## 2023-03-05 ENCOUNTER — Encounter: Payer: Self-pay | Admitting: *Deleted

## 2023-03-05 ENCOUNTER — Inpatient Hospital Stay: Payer: Medicare Other | Attending: Oncology | Admitting: Oncology

## 2023-03-05 VITALS — BP 144/82 | HR 64

## 2023-03-05 VITALS — BP 140/70 | HR 82 | Temp 98.2°F | Resp 18 | Ht 66.0 in | Wt 157.6 lb

## 2023-03-05 DIAGNOSIS — T451X5A Adverse effect of antineoplastic and immunosuppressive drugs, initial encounter: Secondary | ICD-10-CM | POA: Diagnosis not present

## 2023-03-05 DIAGNOSIS — R197 Diarrhea, unspecified: Secondary | ICD-10-CM | POA: Diagnosis not present

## 2023-03-05 DIAGNOSIS — C787 Secondary malignant neoplasm of liver and intrahepatic bile duct: Secondary | ICD-10-CM | POA: Diagnosis not present

## 2023-03-05 DIAGNOSIS — C186 Malignant neoplasm of descending colon: Secondary | ICD-10-CM | POA: Diagnosis not present

## 2023-03-05 DIAGNOSIS — G620 Drug-induced polyneuropathy: Secondary | ICD-10-CM | POA: Diagnosis not present

## 2023-03-05 DIAGNOSIS — Z5112 Encounter for antineoplastic immunotherapy: Secondary | ICD-10-CM | POA: Insufficient documentation

## 2023-03-05 DIAGNOSIS — G629 Polyneuropathy, unspecified: Secondary | ICD-10-CM | POA: Insufficient documentation

## 2023-03-05 DIAGNOSIS — D709 Neutropenia, unspecified: Secondary | ICD-10-CM | POA: Diagnosis not present

## 2023-03-05 DIAGNOSIS — Z5111 Encounter for antineoplastic chemotherapy: Secondary | ICD-10-CM | POA: Insufficient documentation

## 2023-03-05 DIAGNOSIS — E876 Hypokalemia: Secondary | ICD-10-CM | POA: Insufficient documentation

## 2023-03-05 DIAGNOSIS — C185 Malignant neoplasm of splenic flexure: Secondary | ICD-10-CM | POA: Diagnosis not present

## 2023-03-05 DIAGNOSIS — R599 Enlarged lymph nodes, unspecified: Secondary | ICD-10-CM | POA: Insufficient documentation

## 2023-03-05 DIAGNOSIS — I4891 Unspecified atrial fibrillation: Secondary | ICD-10-CM | POA: Insufficient documentation

## 2023-03-05 LAB — CBC WITH DIFFERENTIAL (CANCER CENTER ONLY)
Abs Immature Granulocytes: 0.01 10*3/uL (ref 0.00–0.07)
Basophils Absolute: 0 10*3/uL (ref 0.0–0.1)
Basophils Relative: 1 %
Eosinophils Absolute: 0.2 10*3/uL (ref 0.0–0.5)
Eosinophils Relative: 5 %
HCT: 38.9 % (ref 36.0–46.0)
Hemoglobin: 13.3 g/dL (ref 12.0–15.0)
Immature Granulocytes: 0 %
Lymphocytes Relative: 30 %
Lymphs Abs: 1.6 10*3/uL (ref 0.7–4.0)
MCH: 35.9 pg — ABNORMAL HIGH (ref 26.0–34.0)
MCHC: 34.2 g/dL (ref 30.0–36.0)
MCV: 105.1 fL — ABNORMAL HIGH (ref 80.0–100.0)
Monocytes Absolute: 0.4 10*3/uL (ref 0.1–1.0)
Monocytes Relative: 8 %
Neutro Abs: 3.1 10*3/uL (ref 1.7–7.7)
Neutrophils Relative %: 56 %
Platelet Count: 205 10*3/uL (ref 150–400)
RBC: 3.7 MIL/uL — ABNORMAL LOW (ref 3.87–5.11)
RDW: 13.5 % (ref 11.5–15.5)
WBC Count: 5.4 10*3/uL (ref 4.0–10.5)
nRBC: 0 % (ref 0.0–0.2)

## 2023-03-05 LAB — CMP (CANCER CENTER ONLY)
ALT: 17 U/L (ref 0–44)
AST: 26 U/L (ref 15–41)
Albumin: 4 g/dL (ref 3.5–5.0)
Alkaline Phosphatase: 49 U/L (ref 38–126)
Anion gap: 7 (ref 5–15)
BUN: 26 mg/dL — ABNORMAL HIGH (ref 8–23)
CO2: 26 mmol/L (ref 22–32)
Calcium: 9.5 mg/dL (ref 8.9–10.3)
Chloride: 103 mmol/L (ref 98–111)
Creatinine: 0.99 mg/dL (ref 0.44–1.00)
GFR, Estimated: 58 mL/min — ABNORMAL LOW (ref >60)
Glucose, Bld: 98 mg/dL (ref 70–99)
Potassium: 4.6 mmol/L (ref 3.5–5.1)
Sodium: 136 mmol/L (ref 135–145)
Total Bilirubin: 0.5 mg/dL (ref 0.3–1.2)
Total Protein: 6.2 g/dL — ABNORMAL LOW (ref 6.5–8.1)

## 2023-03-05 LAB — CEA (ACCESS): CEA (CHCC): 1.12 ng/mL (ref 0.00–5.00)

## 2023-03-05 MED ORDER — SODIUM CHLORIDE 0.9 % IV SOLN: Freq: Once | INTRAVENOUS | Status: AC

## 2023-03-05 MED ORDER — SODIUM CHLORIDE 0.9% FLUSH
10.0000 mL | INTRAVENOUS | Status: DC | PRN
  Administered 2023-03-05: 10 mL

## 2023-03-05 MED ORDER — SODIUM CHLORIDE 0.9 % IV SOLN
7.5000 mg/kg | Freq: Once | INTRAVENOUS | Status: AC
  Administered 2023-03-05: 500 mg via INTRAVENOUS
  Filled 2023-03-05: qty 16

## 2023-03-05 MED ORDER — SODIUM CHLORIDE 0.9 % IV SOLN
1600.0000 mg/m2 | INTRAVENOUS | Status: DC
  Administered 2023-03-05: 2900 mg via INTRAVENOUS
  Filled 2023-03-05: qty 58

## 2023-03-05 NOTE — Patient Instructions (Addendum)
[facility]   The chemotherapy medication bag should finish at 46 hours, 96 hours, or 7 days. For example, if your pump is scheduled for 46 hours and it was put on at 4:00 p.m., it should finish at 2:00 p.m. the day it is scheduled to come off regardless of your appointment time.     Estimated time to finish at 10:00 Thursday, March 07, 2023.   If the display on your pump reads "Low Volume" and it is beeping, take the batteries out of the pump and come to the cancer center for it to be taken off.   If the pump alarms go off prior to the pump reading "Low Volume" then call [phone] and someone can assist you.  If the plunger comes out and the chemotherapy medication is leaking out, please use your home chemo spill kit to clean up the spill. Do NOT use paper towels or other household products.  If you have problems or questions regarding your pump, please call either [phone] (24 hours a day) or the cancer center Monday-Friday 8:00 a.m.- 4:30 p.m. at the clinic number and we will assist you. If you are unable to get assistance, then go to the nearest Emergency Department and ask the staff to contact the IV team for assistance.  Discharge Instructions: Thank you for choosing [facility] to provide your oncology and hematology care.   If you have a lab appointment with the Cancer Center, please go directly to the Cancer Center and check in at the registration area.   Wear comfortable clothing and clothing appropriate for easy access to any Portacath or PICC line.   We strive to give you quality time with your provider. You may need to reschedule your appointment if you arrive late (15 or more minutes).  Arriving late affects you and other patients whose appointments are after yours.  Also, if you miss three or more appointments without notifying the office, you may be dismissed from the clinic at the provider's discretion.      For  prescription refill requests, have your pharmacy contact our office and allow 72 hours for refills to be completed.    Today you received the following chemotherapy and/or immunotherapy agents Bevacizumab, Fluorouracil.      To help prevent nausea and vomiting after your treatment, we encourage you to take your nausea medication as directed.  BELOW ARE SYMPTOMS THAT SHOULD BE REPORTED IMMEDIATELY: *FEVER GREATER THAN 100.4 F (38 C) OR HIGHER *CHILLS OR SWEATING *NAUSEA AND VOMITING THAT IS NOT CONTROLLED WITH YOUR NAUSEA MEDICATION *UNUSUAL SHORTNESS OF BREATH *UNUSUAL BRUISING OR BLEEDING *URINARY PROBLEMS (pain or burning when urinating, or frequent urination) *BOWEL PROBLEMS (unusual diarrhea, constipation, pain near the anus) TENDERNESS IN MOUTH AND THROAT WITH OR WITHOUT PRESENCE OF ULCERS (sore throat, sores in mouth, or a toothache) UNUSUAL RASH, SWELLING OR PAIN  UNUSUAL VAGINAL DISCHARGE OR ITCHING   Items with * indicate a potential emergency and should be followed up as soon as possible or go to the Emergency Department if any problems should occur.  Please show the CHEMOTHERAPY ALERT CARD or IMMUNOTHERAPY ALERT CARD at check-in to the Emergency Department and triage nurse.  Should you have questions after your visit or need to cancel or reschedule your appointment, please contact [facility]  Dept: [phone]  and follow the prompts.  Office hours are 8:00 a.m. to 4:30 p.m. Monday - Friday. Please note that  voicemails left after 4:00 p.m. may not be returned until the following business day.  We are closed weekends and major holidays. You have access to a nurse at all times for urgent questions. Please call the main number to the clinic Dept: [phone] and follow the prompts.   For any non-urgent questions, you may also contact your provider using MyChart. We now offer e-Visits for anyone 71 and older to request care online for non-urgent  symptoms. For details visit [email or web address].   Also download the MyChart app! Go to the app store, search "MyChart", open the app, select [facility], and log in with your MyChart username and password.  Bevacizumab Injection What is this medication? BEVACIZUMAB (be va SIZ yoo mab) treats some types of cancer. It works by blocking a protein that causes cancer cells to grow and multiply. This helps to slow or stop the spread of cancer cells. It is a monoclonal antibody. This medicine may be used for other purposes; ask your health care provider or pharmacist if you have questions. COMMON BRAND NAME(S): Alymsys, Avastin, MVASI, [name] What should I tell my care team before I take this medication? They need to know if you have any of these conditions: Blood clots Coughing up blood Having or recent surgery Heart failure High blood pressure History of a connection between 2 or more body parts that do not usually connect (fistula) History of a tear in your stomach or intestines Protein in your urine An unusual or allergic reaction to bevacizumab, other medications, foods, dyes, or preservatives Pregnant or trying to get pregnant Breast-feeding How should I use this medication? This medication is injected into a vein. It is given by your care team in a hospital or clinic setting. Talk to your care team the use of this medication in children. Special care may be needed. Overdosage: If you think you have taken too much of this medicine contact a poison control center or emergency room at once. NOTE: This medicine is only for you. Do not share this medicine with others. What if I miss a dose? Keep appointments for follow-up doses. It is important not to miss your dose. Call your care team if you are unable to keep an appointment. What may interact with this medication? Interactions are not expected. This list may not describe all possible interactions. Give your health care provider a  list of all the medicines, herbs, non-prescription drugs, or dietary supplements you use. Also tell them if you smoke, drink alcohol, or use illegal drugs. Some items may interact with your medicine. What should I watch for while using this medication? Your condition will be monitored carefully while you are receiving this medication. You may need blood work while taking this medication. This medication may make you feel generally unwell. This is not uncommon as chemotherapy can affect healthy cells as well as cancer cells. Report any side effects. Continue your course of treatment even though you feel ill unless your care team tells you to stop. This medication may increase your risk to bruise or bleed. Call your care team if you notice any unusual bleeding. Before having surgery, talk to your care team to make sure it is ok. This medication can increase the risk of poor healing of your surgical site or wound. You will need to stop this medication for 28 days before surgery. After surgery, wait at least 28 days before restarting this medication. Make sure the surgical site or wound is healed enough before restarting this medication.  Talk to your care team if questions. Talk to your care team if you may be pregnant. Serious birth defects can occur if you take this medication during pregnancy and for 6 months after the last dose. Contraception is recommended while taking this medication and for 6 months after the last dose. Your care team can help you find the option that works for you. Do not breastfeed while taking this medication and for 6 months after the last dose. This medication can cause infertility. Talk to your care team if you are concerned about your fertility. What side effects may I notice from receiving this medication? Side effects that you should report to your care team as soon as possible: Allergic reactions--skin rash, itching, hives, swelling of the face, lips, tongue, or  throat Bleeding--bloody or black, tar-like stools, vomiting blood or brown material that looks like coffee grounds, red or dark brown urine, small red or purple spots on skin, unusual bruising or bleeding Blood clot--pain, swelling, or warmth in the leg, shortness of breath, chest pain Heart attack--pain or tightness in the chest, shoulders, arms, or jaw, nausea, shortness of breath, cold or clammy skin, feeling faint or lightheaded Heart failure--shortness of breath, swelling of the ankles, feet, or hands, sudden weight gain, unusual weakness or fatigue Increase in blood pressure Infection--fever, chills, cough, sore throat, wounds that don't heal, pain or trouble when passing urine, general feeling of discomfort or being unwell Infusion reactions--chest pain, shortness of breath or trouble breathing, feeling faint or lightheaded Kidney injury--decrease in the amount of urine, swelling of the ankles, hands, or feet Stomach pain that is severe, does not go away, or gets worse Stroke--sudden numbness or weakness of the face, arm, or leg, trouble speaking, confusion, trouble walking, loss of balance or coordination, dizziness, severe headache, change in vision Sudden and severe headache, confusion, change in vision, seizures, which may be signs of posterior reversible encephalopathy syndrome (PRES) Side effects that usually do not require medical attention (report to your care team if they continue or are bothersome): Back pain Change in taste Diarrhea Dry skin Increased tears Nosebleed This list may not describe all possible side effects. Call your doctor for medical advice about side effects. You may report side effects to FDA at 1-800-FDA-1088. Where should I keep my medication? This medication is given in a hospital or clinic. It will not be stored at home. NOTE: This sheet is a summary. It may not cover all possible information. If you have questions about this medicine, talk to your doctor,  pharmacist, or health care provider.  2023 Elsevier/Gold Standard (2022-03-09 00:00:00)  Fluorouracil Injection What is this medication? FLUOROURACIL (flure oh YOOR a sil) treats some types of cancer. It works by slowing down the growth of cancer cells. This medicine may be used for other purposes; ask your health care provider or pharmacist if you have questions. COMMON BRAND NAME(S): Adrucil What should I tell my care team before I take this medication? They need to know if you have any of these conditions: Blood disorders Dihydropyrimidine dehydrogenase (DPD) deficiency Infection, such as chickenpox, cold sores, herpes Kidney disease Liver disease Poor nutrition Recent or ongoing radiation therapy An unusual or allergic reaction to fluorouracil, other medications, foods, dyes, or preservatives If you or your partner are pregnant or trying to get pregnant Breast-feeding How should I use this medication? This medication is injected into a vein. It is administered by your care team in a hospital or clinic setting. Talk to your care  team about the use of this medication in children. Special care may be needed. Overdosage: If you think you have taken too much of this medicine contact a poison control center or emergency room at once. NOTE: This medicine is only for you. Do not share this medicine with others. What if I miss a dose? Keep appointments for follow-up doses. It is important not to miss your dose. Call your care team if you are unable to keep an appointment. What may interact with this medication? Do not take this medication with any of the following: Live virus vaccines This medication may also interact with the following: Medications that treat or prevent blood clots, such as warfarin, enoxaparin, dalteparin This list may not describe all possible interactions. Give your health care provider a list of all the medicines, herbs, non-prescription drugs, or dietary supplements  you use. Also tell them if you smoke, drink alcohol, or use illegal drugs. Some items may interact with your medicine. What should I watch for while using this medication? Your condition will be monitored carefully while you are receiving this medication. This medication may make you feel generally unwell. This is not uncommon as chemotherapy can affect healthy cells as well as cancer cells. Report any side effects. Continue your course of treatment even though you feel ill unless your care team tells you to stop. In some cases, you may be given additional medications to help with side effects. Follow all directions for their use. This medication may increase your risk of getting an infection. Call your care team for advice if you get a fever, chills, sore throat, or other symptoms of a cold or flu. Do not treat yourself. Try to avoid being around people who are sick. This medication may increase your risk to bruise or bleed. Call your care team if you notice any unusual bleeding. Be careful brushing or flossing your teeth or using a toothpick because you may get an infection or bleed more easily. If you have any dental work done, tell your dentist you are receiving this medication. Avoid taking medications that contain aspirin, acetaminophen, ibuprofen, naproxen, or ketoprofen unless instructed by your care team. These medications may hide a fever. Do not treat diarrhea with over the counter products. Contact your care team if you have diarrhea that lasts more than 2 days or if it is severe and watery. This medication can make you more sensitive to the sun. Keep out of the sun. If you cannot avoid being in the sun, wear protective clothing and sunscreen. Do not use sun lamps, tanning beds, or tanning booths. Talk to your care team if you or your partner wish to become pregnant or think you might be pregnant. This medication can cause serious birth defects if taken during pregnancy and for 3 months after  the last dose. A reliable form of contraception is recommended while taking this medication and for 3 months after the last dose. Talk to your care team about effective forms of contraception. Do not father a child while taking this medication and for 3 months after the last dose. Use a condom while having sex during this time period. Do not breastfeed while taking this medication. This medication may cause infertility. Talk to your care team if you are concerned about your fertility. What side effects may I notice from receiving this medication? Side effects that you should report to your care team as soon as possible: Allergic reactions--skin rash, itching, hives, swelling of the face, lips, tongue,  or throat Heart attack--pain or tightness in the chest, shoulders, arms, or jaw, nausea, shortness of breath, cold or clammy skin, feeling faint or lightheaded Heart failure--shortness of breath, swelling of the ankles, feet, or hands, sudden weight gain, unusual weakness or fatigue Heart rhythm changes--fast or irregular heartbeat, dizziness, feeling faint or lightheaded, chest pain, trouble breathing High ammonia level--unusual weakness or fatigue, confusion, loss of appetite, nausea, vomiting, seizures Infection--fever, chills, cough, sore throat, wounds that don't heal, pain or trouble when passing urine, general feeling of discomfort or being unwell Low red blood cell level--unusual weakness or fatigue, dizziness, headache, trouble breathing Pain, tingling, or numbness in the hands or feet, muscle weakness, change in vision, confusion or trouble speaking, loss of balance or coordination, trouble walking, seizures Redness, swelling, and blistering of the skin over hands and feet Severe or prolonged diarrhea Unusual bruising or bleeding Side effects that usually do not require medical attention (report to your care team if they continue or are bothersome): Dry skin Headache Increased  tears Nausea Pain, redness, or swelling with sores inside the mouth or throat Sensitivity to light Vomiting This list may not describe all possible side effects. Call your doctor for medical advice about side effects. You may report side effects to FDA at 1-800-FDA-1088. Where should I keep my medication? This medication is given in a hospital or clinic. It will not be stored at home. NOTE: This sheet is a summary. It may not cover all possible information. If you have questions about this medicine, talk to your doctor, pharmacist, or health care provider.  2023 Elsevier/Gold Standard (2022-03-06 00:00:00)

## 2023-03-05 NOTE — Patient Instructions (Signed)

## 2023-03-05 NOTE — Progress Notes (Signed)
[facility] OFFICE PROGRESS NOTE   Diagnosis: Colon cancer  INTERVAL HISTORY:   [name] returns as scheduled.  She completed another cycle of 5-FU/Avastin on 02/05/2023.  She reports mild diarrhea and increased neuropathy symptoms following this cycle of treatment.  No other complaint.  Objective:  Vital signs in last 24 hours:  Blood pressure (!) 140/70, pulse 82, temperature 98.2 F (36.8 C), temperature source Oral, resp. rate 18, height  (1.676 m), weight 157 lb 9.6 oz (71.5 kg), last menstrual period 11/19/1996, SpO2 100 %.    HEENT: No thrush or ulcers Resp: Lungs clear bilaterally Cardio: Regular rate and rhythm GI: No hepatosplenomegaly, no mass, nontender Vascular: No leg edema  Portacath/PICC-without erythema  Lab Results:  Lab Results  Component Value Date   WBC 5.4 03/05/2023   HGB 13.3 03/05/2023   HCT 38.9 03/05/2023   MCV 105.1 (H) 03/05/2023   PLT 205 03/05/2023   NEUTROABS 3.1 03/05/2023    CMP  Lab Results  Component Value Date   NA 138 02/05/2023   K 4.4 02/05/2023   CL 105 02/05/2023   CO2 28 02/05/2023   GLUCOSE 70 02/05/2023   BUN 30 (H) 02/05/2023   CREATININE 1.06 (H) 02/05/2023   CALCIUM 9.7 02/05/2023   PROT 6.9 02/05/2023   ALBUMIN 4.0 02/05/2023   AST 26 02/05/2023   ALT 20 02/05/2023   ALKPHOS 61 02/05/2023   BILITOT 0.4 02/05/2023   GFRNONAA 53 (L) 02/05/2023   GFRAA >60 08/16/2020    Lab Results  Component Value Date   CEA1 1.16 03/29/2021   CEA 1.12 03/05/2023     Medications: I have reviewed the patient's current medications.   Assessment/Plan: Adenocarcinoma the left colon, stage IIIc (pT4b,pN2a), status post a left colectomy 05/30/2020, MSS, TMB 13, BRAF V600E Tumor invades the visceral peritoneum, lymphovascular and perineural invasion present, for tumor deposits, 7/13 lymph nodes, negative resection margins, no loss of mismatch repair protein expression CT abdomen/pelvis 05/26/2020-long  segment of masslike thickening of the descending colon with associated high-grade colonic obstruction, small colonic wall defect with evidence of a focally contained microperforation, retroperitoneal adenopathy, indeterminate small hypodense liver lesions Elevated preoperative CEA PET 06/22/2020-hypermetabolic liver metastases, periportal and periaortic adenopathy, hypermetabolic mediastinal and left supraclavicular nodes Cycle 1 FOLFOX 07/05/20 Cycle 2 FOLFOXIRI (dose-reduced 5FU, no bolus) and Avastin 07/19/20  Cycle 3 FOLFOXIRI (Dose reduced 5-FU, no bolus)/Avastin 08/02/2020 Cycle 4 FOLFOXIRI/Avastin 08/16/2020 (Udenyca added) Cycle 5 FOLFOXIRI/Avastin 08/30/2020 CTs 09/09/2020-diminished size of lymph nodes in the chest, abdomen, and pelvis.  Decreased hepatic metastases.  No new evidence of disease progression, fat density lesion surrounding the left hemicolectomy Cycle 6 FOLFOXIRI/Avastin 09/13/2020 Cycle 7 FOLFOX/Avastin 09/27/2020 (irinotecan held) Cycle 8 FOLFOXIRI/Avastin 10/18/2020  Cycle 9 FOLFOXIRI/Avastin 11/01/2020 Cycle 10 FOLFOXIRI/Avastin 11/22/2020 (oxaliplatin held, irinotecan and 5-FU dose reduced) Cycle 11 FOLFOXIRI/Avastin 12/12/2020 (oxaliplatin held) CTs 12/19/2020-decrease in size of liver metastases, no evidence of disease progression Cycle 12 FOLFOXIRI/Avastin 01/04/2021 (oxaliplatin held) Cycle 13 FOLFOXIRI/Avastin 01/24/2021 (oxaliplatin held) Cycle 14 FOLFOXIRI/Avastin 02/14/2021 (oxaliplatin held) Cycle 15 FOLFOXIRI/Avastin 03/07/2021 (oxaliplatin held) Cycle 16 FOLFOXIRI/Avastin 03/29/2021 (oxaliplatin held) CTs 04/14/2021- decreased size of liver metastases.  No new or progressive findings. Cycle 17 FOLFOXIRI/Avastin 04/19/2021 (oxaliplatin held) Cycle 18 FOLFOXIRI/Avastin 05/09/2021 (oxaliplatin held) Cycle 19 FOLFOXIRI/Avastin 05/30/2021 (oxaliplatin held) Cycle 20 FOLFOXIRI/Avastin 06/20/2021 (oxaliplatin held) Cycle 21 FOLFOXIRI/Avastin 07/11/2021 (oxaliplatin held) CTs  07/28/2021-unchanged subcentimeter liver lesions, no evidence of new metastatic disease Cycle 22 5-FU/ Avastin 08/01/2021 Cycle 23 5-FU/Avastin 08/22/2021 Cycle 24 5-FU/Avastin 09/12/2021  Cycle 25 5-FU/Avastin 10/03/2021 Cycle 26 5-FU/Avastin 10/24/2021 Cycle 27 5-FU/Avastin 11/15/2021 Cycle 28 5-FU/Avastin 12/05/2021 CTs 12/22/2021-grossly similar subcentimeter low-attenuation lesions in the liver compatible with treated metastasis.  No evidence for new metastatic disease in the chest, abdomen or pelvis. Cycle 29 5-FU/Avastin 12/26/2021 Cycle 30 5-FU/Avastin 01/16/2022 Cycle 41 5-FU/Avastin 02/06/2022 Cycle 42 5-FU/Avastin 02/27/2022 Cycle 43 5-FU/Avastin 03/20/2022 Cycle 44 5-FU/Avastin 04/10/2022 CT 04/28/2022-interval development of a 3 mm nodule within the left lower lobe and 3 mm nodule within the right lower lobe, nonspecific.  Unchanged subcentimeter low-attenuation lesions in the liver compatible with treated metastasis. Cycle 45 5-FU/Avastin 05/01/2022 Cycle 46 5-FU/Avastin 05/29/2022 Cycle 47 5-FU/Avastin 06/19/2022 Cycle 48 5-FU/Avastin 07/10/2022 Cycle 49 5-FU/Avastin 07/31/2022 Cycle 50 5-FU/Avastin 08/21/2022 CTs 09/07/2022-no evidence of metastatic disease, tiny lung nodules described as new on previous exam have resolved, no hepatic metastases are appreciated Cycle 51 5-FU/Avastin 09/11/2022 Cycle 52 5-FU/Avastin 10/02/2022 Cycle 53 5-FU/Avastin 10/23/2022 Cycle 54 5-FU/Avastin 11/20/2021 Cycle 55 5-FU/Avastin 12/11/2022 Cycle 56 5-FU/Avastin 01/08/2023 Cycle 57 5-FU/Avastin 02/05/2023 CTs 02/28/2023-no evidence of recurrent or metastatic disease, stable left paracolic gutter interstitial thickening Cycle 58 5-FU/Avastin 03/05/2023     Atrial fibrillation with rapid ventricular response 05/26/2020 Mild neutropenia following cycle 3 FOLFOXIRI, Udenyca added with cycle 4 Hypokalemia secondary to diarrhea-potassium supplementation starting 09/13/2020 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 remains in clinical remission from colon cancer.  The restaging CTs revealed no evidence of progressive disease.  She has been maintained on systemic therapy since August 2021.  We discussed treatment options including a treatment break and continuing the current therapy.  I recommend continuing the 5-FU/Avastin.  She is tolerating the treatment well.  I reviewed the restaging CT findings and images with Ms. Jarold Motto.  She will complete another treatment with 5-FU/Avastin today.  She will return for an office visit and chemotherapy in 4 weeks.  Thornton Papas, MD  03/05/2023  10:35 AM

## 2023-03-05 NOTE — Progress Notes (Signed)
Patient seen by Dr. Sherrill today ? ?Vitals are within treatment parameters. ? ?Labs reviewed by Dr. Sherrill and are within treatment parameters. ? ?Per physician team, patient is ready for treatment and there are NO modifications to the treatment plan.  ?

## 2023-03-06 ENCOUNTER — Other Ambulatory Visit: Payer: Self-pay

## 2023-03-07 ENCOUNTER — Inpatient Hospital Stay: Payer: Medicare Other

## 2023-03-07 VITALS — BP 141/79 | HR 79 | Temp 98.0°F | Resp 20

## 2023-03-07 DIAGNOSIS — Z5111 Encounter for antineoplastic chemotherapy: Secondary | ICD-10-CM | POA: Diagnosis not present

## 2023-03-07 DIAGNOSIS — E876 Hypokalemia: Secondary | ICD-10-CM | POA: Diagnosis not present

## 2023-03-07 DIAGNOSIS — C186 Malignant neoplasm of descending colon: Secondary | ICD-10-CM | POA: Diagnosis not present

## 2023-03-07 DIAGNOSIS — R599 Enlarged lymph nodes, unspecified: Secondary | ICD-10-CM | POA: Diagnosis not present

## 2023-03-07 DIAGNOSIS — G620 Drug-induced polyneuropathy: Secondary | ICD-10-CM | POA: Diagnosis not present

## 2023-03-07 DIAGNOSIS — I4891 Unspecified atrial fibrillation: Secondary | ICD-10-CM | POA: Diagnosis not present

## 2023-03-07 DIAGNOSIS — G629 Polyneuropathy, unspecified: Secondary | ICD-10-CM | POA: Diagnosis not present

## 2023-03-07 DIAGNOSIS — C787 Secondary malignant neoplasm of liver and intrahepatic bile duct: Secondary | ICD-10-CM | POA: Diagnosis not present

## 2023-03-07 DIAGNOSIS — T451X5A Adverse effect of antineoplastic and immunosuppressive drugs, initial encounter: Secondary | ICD-10-CM | POA: Diagnosis not present

## 2023-03-07 DIAGNOSIS — D709 Neutropenia, unspecified: Secondary | ICD-10-CM | POA: Diagnosis not present

## 2023-03-07 DIAGNOSIS — R197 Diarrhea, unspecified: Secondary | ICD-10-CM | POA: Diagnosis not present

## 2023-03-07 DIAGNOSIS — C185 Malignant neoplasm of splenic flexure: Secondary | ICD-10-CM

## 2023-03-07 DIAGNOSIS — Z5112 Encounter for antineoplastic immunotherapy: Secondary | ICD-10-CM | POA: Diagnosis not present

## 2023-03-07 MED ORDER — HEPARIN SOD (PORK) LOCK FLUSH 100 UNIT/ML IV SOLN
500.0000 [IU] | Freq: Once | INTRAVENOUS | Status: AC | PRN
  Administered 2023-03-07: 500 [IU]

## 2023-03-07 MED ORDER — SODIUM CHLORIDE 0.9% FLUSH
10.0000 mL | INTRAVENOUS | Status: DC | PRN
  Administered 2023-03-07: 10 mL

## 2023-03-07 NOTE — Patient Instructions (Signed)

## 2023-04-02 ENCOUNTER — Inpatient Hospital Stay: Payer: Medicare Other | Admitting: Nurse Practitioner

## 2023-04-02 ENCOUNTER — Inpatient Hospital Stay: Payer: Medicare Other | Attending: Oncology

## 2023-04-02 ENCOUNTER — Inpatient Hospital Stay: Payer: Medicare Other

## 2023-04-02 ENCOUNTER — Other Ambulatory Visit (HOSPITAL_COMMUNITY): Payer: Self-pay

## 2023-04-02 ENCOUNTER — Encounter: Payer: Self-pay | Admitting: Nurse Practitioner

## 2023-04-02 VITALS — BP 145/69 | HR 70 | Temp 98.1°F | Resp 18 | Ht 66.0 in | Wt 160.0 lb

## 2023-04-02 VITALS — BP 155/69 | HR 60 | Resp 18

## 2023-04-02 DIAGNOSIS — R599 Enlarged lymph nodes, unspecified: Secondary | ICD-10-CM | POA: Diagnosis not present

## 2023-04-02 DIAGNOSIS — E876 Hypokalemia: Secondary | ICD-10-CM | POA: Diagnosis not present

## 2023-04-02 DIAGNOSIS — C185 Malignant neoplasm of splenic flexure: Secondary | ICD-10-CM

## 2023-04-02 DIAGNOSIS — C787 Secondary malignant neoplasm of liver and intrahepatic bile duct: Secondary | ICD-10-CM | POA: Insufficient documentation

## 2023-04-02 DIAGNOSIS — Z5111 Encounter for antineoplastic chemotherapy: Secondary | ICD-10-CM | POA: Insufficient documentation

## 2023-04-02 DIAGNOSIS — I4891 Unspecified atrial fibrillation: Secondary | ICD-10-CM | POA: Diagnosis not present

## 2023-04-02 DIAGNOSIS — G629 Polyneuropathy, unspecified: Secondary | ICD-10-CM | POA: Diagnosis not present

## 2023-04-02 DIAGNOSIS — C186 Malignant neoplasm of descending colon: Secondary | ICD-10-CM | POA: Diagnosis not present

## 2023-04-02 DIAGNOSIS — R197 Diarrhea, unspecified: Secondary | ICD-10-CM | POA: Insufficient documentation

## 2023-04-02 LAB — CBC WITH DIFFERENTIAL (CANCER CENTER ONLY)
Abs Immature Granulocytes: 0.01 10*3/uL (ref 0.00–0.07)
Basophils Absolute: 0 10*3/uL (ref 0.0–0.1)
Basophils Relative: 1 %
Eosinophils Absolute: 0.3 10*3/uL (ref 0.0–0.5)
Eosinophils Relative: 5 %
HCT: 37.6 % (ref 36.0–46.0)
Hemoglobin: 12.6 g/dL (ref 12.0–15.0)
Immature Granulocytes: 0 %
Lymphocytes Relative: 35 %
Lymphs Abs: 1.8 10*3/uL (ref 0.7–4.0)
MCH: 35.7 pg — ABNORMAL HIGH (ref 26.0–34.0)
MCHC: 33.5 g/dL (ref 30.0–36.0)
MCV: 106.5 fL — ABNORMAL HIGH (ref 80.0–100.0)
Monocytes Absolute: 0.4 10*3/uL (ref 0.1–1.0)
Monocytes Relative: 9 %
Neutro Abs: 2.5 10*3/uL (ref 1.7–7.7)
Neutrophils Relative %: 50 %
Platelet Count: 184 10*3/uL (ref 150–400)
RBC: 3.53 MIL/uL — ABNORMAL LOW (ref 3.87–5.11)
RDW: 13.6 % (ref 11.5–15.5)
WBC Count: 5 10*3/uL (ref 4.0–10.5)
nRBC: 0 % (ref 0.0–0.2)

## 2023-04-02 LAB — CEA (ACCESS): CEA (CHCC): 1.28 ng/mL (ref 0.00–5.00)

## 2023-04-02 LAB — CMP (CANCER CENTER ONLY)
ALT: 14 U/L (ref 0–44)
AST: 24 U/L (ref 15–41)
Albumin: 3.8 g/dL (ref 3.5–5.0)
Alkaline Phosphatase: 56 U/L (ref 38–126)
Anion gap: 6 (ref 5–15)
BUN: 24 mg/dL — ABNORMAL HIGH (ref 8–23)
CO2: 26 mmol/L (ref 22–32)
Calcium: 9.2 mg/dL (ref 8.9–10.3)
Chloride: 106 mmol/L (ref 98–111)
Creatinine: 0.93 mg/dL (ref 0.44–1.00)
GFR, Estimated: >60 mL/min (ref >60)
Glucose, Bld: 96 mg/dL (ref 70–99)
Potassium: 4.5 mmol/L (ref 3.5–5.1)
Sodium: 138 mmol/L (ref 135–145)
Total Bilirubin: 0.4 mg/dL (ref 0.3–1.2)
Total Protein: 6.3 g/dL — ABNORMAL LOW (ref 6.5–8.1)

## 2023-04-02 LAB — TOTAL PROTEIN, URINE DIPSTICK: Protein, ur: 30 mg/dL — AB

## 2023-04-02 MED ORDER — SODIUM CHLORIDE 0.9 % IV SOLN
7.5000 mg/kg | Freq: Once | INTRAVENOUS | Status: AC
  Administered 2023-04-02: 500 mg via INTRAVENOUS
  Filled 2023-04-02: qty 16

## 2023-04-02 MED ORDER — SODIUM CHLORIDE 0.9 % IV SOLN
1600.0000 mg/m2 | INTRAVENOUS | Status: DC
  Administered 2023-04-02: 2900 mg via INTRAVENOUS
  Filled 2023-04-02: qty 58

## 2023-04-02 MED ORDER — SODIUM CHLORIDE 0.9 % IV SOLN: Freq: Once | INTRAVENOUS | Status: AC

## 2023-04-02 MED ORDER — POTASSIUM CHLORIDE ER 10 MEQ PO CPCR
10.0000 meq | ORAL_CAPSULE | Freq: Two times a day (BID) | ORAL | 2 refills | Status: DC
Start: 2023-04-02 — End: 2024-02-06
  Filled 2023-04-02: qty 60, 30d supply, fill #0
  Filled 2023-04-22 – 2023-04-25 (×2): qty 60, 30d supply, fill #1
  Filled 2023-06-06: qty 60, 30d supply, fill #2

## 2023-04-02 NOTE — Progress Notes (Signed)
Patient seen by Lisa Thomas NP today  Vitals are within treatment parameters.  Labs reviewed by Lisa Thomas NP and are within treatment parameters.  Per physician team, patient is ready for treatment and there are NO modifications to the treatment plan.     

## 2023-04-02 NOTE — Progress Notes (Signed)
[facility] OFFICE PROGRESS NOTE   Diagnosis: Colon cancer  INTERVAL HISTORY:   Kathy Robinson returns as scheduled.  She completed another cycle of 5-FU/Avastin 03/05/2023.  She feels well.  Neuropathy symptoms are stable, feet greater than hands.  No nausea or vomiting.  No mouth sores.  No diarrhea.  No bleeding.  Objective:  Vital signs in last 24 hours:  Blood pressure (!) 145/69, pulse 70, temperature 98.1 F (36.7 C), temperature source Oral, resp. rate 18, height 5\' 6"  (1.676 m), weight 160 lb (72.6 kg), last menstrual period 11/19/1996, SpO2 100 %.    HEENT: No thrush or ulcers. Resp: Lungs clear bilaterally. Cardio: Regular rate and rhythm. GI: No hepatosplenomegaly. Vascular: Trace bilateral ankle edema. Neuro: Alert and oriented. Skin: Palms without erythema. Port-A-Cath without erythema.  Lab Results:  Lab Results  Component Value Date   WBC 5.0 04/02/2023   HGB 12.6 04/02/2023   HCT 37.6 04/02/2023   MCV 106.5 (H) 04/02/2023   PLT 184 04/02/2023   NEUTROABS 2.5 04/02/2023    Imaging:  No results found.  Medications: I have reviewed the patient's current medications.  Assessment/Plan: Adenocarcinoma the left colon, stage IIIc (pT4b,pN2a), status post a left colectomy 05/30/2020, MSS, TMB 13, BRAF V600E Tumor invades the visceral peritoneum, lymphovascular and perineural invasion present, for tumor deposits, 7/13 lymph nodes, negative resection margins, no loss of mismatch repair protein expression CT abdomen/pelvis 05/26/2020-long segment of masslike thickening of the descending colon with associated high-grade colonic obstruction, small colonic wall defect with evidence of a focally contained microperforation, retroperitoneal adenopathy, indeterminate small hypodense liver lesions Elevated preoperative CEA PET 06/22/2020-hypermetabolic liver metastases, periportal and periaortic adenopathy, hypermetabolic mediastinal and left supraclavicular  nodes Cycle 1 FOLFOX 07/05/20 Cycle 2 FOLFOXIRI (dose-reduced 5FU, no bolus) and Avastin 07/19/20  Cycle 3 FOLFOXIRI (Dose reduced 5-FU, no bolus)/Avastin 08/02/2020 Cycle 4 FOLFOXIRI/Avastin 08/16/2020 (Udenyca added) Cycle 5 FOLFOXIRI/Avastin 08/30/2020 CTs 09/09/2020-diminished size of lymph nodes in the chest, abdomen, and pelvis.  Decreased hepatic metastases.  No new evidence of disease progression, fat density lesion surrounding the left hemicolectomy Cycle 6 FOLFOXIRI/Avastin 09/13/2020 Cycle 7 FOLFOX/Avastin 09/27/2020 (irinotecan held) Cycle 8 FOLFOXIRI/Avastin 10/18/2020  Cycle 9 FOLFOXIRI/Avastin 11/01/2020 Cycle 10 FOLFOXIRI/Avastin 11/22/2020 (oxaliplatin held, irinotecan and 5-FU dose reduced) Cycle 11 FOLFOXIRI/Avastin 12/12/2020 (oxaliplatin held) CTs 12/19/2020-decrease in size of liver metastases, no evidence of disease progression Cycle 12 FOLFOXIRI/Avastin 01/04/2021 (oxaliplatin held) Cycle 13 FOLFOXIRI/Avastin 01/24/2021 (oxaliplatin held) Cycle 14 FOLFOXIRI/Avastin 02/14/2021 (oxaliplatin held) Cycle 15 FOLFOXIRI/Avastin 03/07/2021 (oxaliplatin held) Cycle 16 FOLFOXIRI/Avastin 03/29/2021 (oxaliplatin held) CTs 04/14/2021- decreased size of liver metastases.  No new or progressive findings. Cycle 17 FOLFOXIRI/Avastin 04/19/2021 (oxaliplatin held) Cycle 18 FOLFOXIRI/Avastin 05/09/2021 (oxaliplatin held) Cycle 19 FOLFOXIRI/Avastin 05/30/2021 (oxaliplatin held) Cycle 20 FOLFOXIRI/Avastin 06/20/2021 (oxaliplatin held) Cycle 21 FOLFOXIRI/Avastin 07/11/2021 (oxaliplatin held) CTs 07/28/2021-unchanged subcentimeter liver lesions, no evidence of new metastatic disease Cycle 22 5-FU/ Avastin 08/01/2021 Cycle 23 5-FU/Avastin 08/22/2021 Cycle 24 5-FU/Avastin 09/12/2021 Cycle 25 5-FU/Avastin 10/03/2021 Cycle 26 5-FU/Avastin 10/24/2021 Cycle 27 5-FU/Avastin 11/15/2021 Cycle 28 5-FU/Avastin 12/05/2021 CTs 12/22/2021-grossly similar subcentimeter low-attenuation lesions in the liver compatible with treated  metastasis.  No evidence for new metastatic disease in the chest, abdomen or pelvis. Cycle 29 5-FU/Avastin 12/26/2021 Cycle 30 5-FU/Avastin 01/16/2022 Cycle 41 5-FU/Avastin 02/06/2022 Cycle 42 5-FU/Avastin 02/27/2022 Cycle 43 5-FU/Avastin 03/20/2022 Cycle 44 5-FU/Avastin 04/10/2022 CT 04/28/2022-interval development of a 3 mm nodule within the left lower lobe and 3 mm nodule within the right lower lobe, nonspecific.  Unchanged subcentimeter low-attenuation lesions in the  liver compatible with treated metastasis. Cycle 45 5-FU/Avastin 05/01/2022 Cycle 46 5-FU/Avastin 05/29/2022 Cycle 47 5-FU/Avastin 06/19/2022 Cycle 48 5-FU/Avastin 07/10/2022 Cycle 49 5-FU/Avastin 07/31/2022 Cycle 50 5-FU/Avastin 08/21/2022 CTs 09/07/2022-no evidence of metastatic disease, tiny lung nodules described as new on previous exam have resolved, no hepatic metastases are appreciated Cycle 51 5-FU/Avastin 09/11/2022 Cycle 52 5-FU/Avastin 10/02/2022 Cycle 53 5-FU/Avastin 10/23/2022 Cycle 54 5-FU/Avastin 11/20/2021 Cycle 55 5-FU/Avastin 12/11/2022 Cycle 56 5-FU/Avastin 01/08/2023 Cycle 57 5-FU/Avastin 02/05/2023 CTs 02/28/2023-no evidence of recurrent or metastatic disease, stable left paracolic gutter interstitial thickening Cycle 58 5-FU/Avastin 03/05/2023 Cycle 59 5-FU/Avastin 04/02/2023     Atrial fibrillation with rapid ventricular response 05/26/2020 Mild neutropenia following cycle 3 FOLFOXIRI, Udenyca added with cycle 4 Hypokalemia secondary to diarrhea-potassium supplementation starting 09/13/2020 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  She maintenance 5-FU/Avastin on a 4-week schedule.  There is no clinical evidence of disease progression.  Plan to proceed with treatment today as scheduled.  CBC reviewed.  Counts adequate to proceed as above.  She will return for lab,  follow-up, 5-FU/Avastin in 4 weeks.  She will contact the office in the interim with any problems.    Lonna Cobb ANP/GNP-BC   04/02/2023  9:54 AM

## 2023-04-02 NOTE — Patient Instructions (Signed)
[facility]   Discharge Instructions: Thank you for choosing [facility] to provide your oncology and hematology care.   If you have a lab appointment with the [facility], please go directly to the [facility] and check in at the registration area.   Wear comfortable clothing and clothing appropriate for easy access to any Portacath or PICC line.   We strive to give you quality time with your provider. You may need to reschedule your appointment if you arrive late (15 or more minutes).  Arriving late affects you and other patients whose appointments are after yours.  Also, if you miss three or more appointments without notifying the office, you may be dismissed from the clinic at the provider's discretion.      For prescription refill requests, have your pharmacy contact our office and allow 72 hours for refills to be completed.    Today you received the following chemotherapy and/or immunotherapy agents Bevacizumab-bvzr (ZIRABEV) & Flourouracil (ADRUCIL).      To help prevent nausea and vomiting after your treatment, we encourage you to take your nausea medication as directed.  BELOW ARE SYMPTOMS THAT SHOULD BE REPORTED IMMEDIATELY: *FEVER GREATER THAN 100.4 F (38 C) OR HIGHER *CHILLS OR SWEATING *NAUSEA AND VOMITING THAT IS NOT CONTROLLED WITH YOUR NAUSEA MEDICATION *UNUSUAL SHORTNESS OF BREATH *UNUSUAL BRUISING OR BLEEDING *URINARY PROBLEMS (pain or burning when urinating, or frequent urination) *BOWEL PROBLEMS (unusual diarrhea, constipation, pain near the anus) TENDERNESS IN MOUTH AND THROAT WITH OR WITHOUT PRESENCE OF ULCERS (sore throat, sores in mouth, or a toothache) UNUSUAL RASH, SWELLING OR PAIN  UNUSUAL VAGINAL DISCHARGE OR ITCHING   Items with * indicate a potential emergency and should be followed up as soon as possible or go to the Emergency Department if any problems should occur.  Please show the CHEMOTHERAPY ALERT  CARD or IMMUNOTHERAPY ALERT CARD at check-in to the Emergency Department and triage nurse.  Should you have questions after your visit or need to cancel or reschedule your appointment, please contact [facility]  Dept: [phone]  and follow the prompts.  Office hours are 8:00 a.m. to 4:30 p.m. Monday - Friday. Please note that voicemails left after 4:00 p.m. may not be returned until the following business day.  We are closed weekends and major holidays. You have access to a nurse at all times for urgent questions. Please call the main number to the clinic Dept: [phone] and follow the prompts.   For any non-urgent questions, you may also contact your provider using MyChart. We now offer e-Visits for anyone 74 and older to request care online for non-urgent symptoms. For details visit [email or web address].   Also download the MyChart app! Go to the app store, search "MyChart", open the app, select [facility], and log in with your MyChart username and password.  Bevacizumab Injection What is this medication? BEVACIZUMAB (be va SIZ yoo mab) treats some types of cancer. It works by blocking a protein that causes cancer cells to grow and multiply. This helps to slow or stop the spread of cancer cells. It is a monoclonal antibody. This medicine may be used for other purposes; ask your health care provider or pharmacist if you have questions. COMMON BRAND NAME(S): Alymsys, Avastin, MVASI, [name] What should I tell my care team before I take this medication? They need to know if you have any of these conditions: Blood clots Coughing up blood Having or  recent surgery Heart failure High blood pressure History of a connection between 2 or more body parts that do not usually connect (fistula) History of a tear in your stomach or intestines Protein in your urine An unusual or allergic reaction to bevacizumab, other medications, foods, dyes, or  preservatives Pregnant or trying to get pregnant Breast-feeding How should I use this medication? This medication is injected into a vein. It is given by your care team in a hospital or clinic setting. Talk to your care team the use of this medication in children. Special care may be needed. Overdosage: If you think you have taken too much of this medicine contact a poison control center or emergency room at once. NOTE: This medicine is only for you. Do not share this medicine with others. What if I miss a dose? Keep appointments for follow-up doses. It is important not to miss your dose. Call your care team if you are unable to keep an appointment. What may interact with this medication? Interactions are not expected. This list may not describe all possible interactions. Give your health care provider a list of all the medicines, herbs, non-prescription drugs, or dietary supplements you use. Also tell them if you smoke, drink alcohol, or use illegal drugs. Some items may interact with your medicine. What should I watch for while using this medication? Your condition will be monitored carefully while you are receiving this medication. You may need blood work while taking this medication. This medication may make you feel generally unwell. This is not uncommon as chemotherapy can affect healthy cells as well as cancer cells. Report any side effects. Continue your course of treatment even though you feel ill unless your care team tells you to stop. This medication may increase your risk to bruise or bleed. Call your care team if you notice any unusual bleeding. Before having surgery, talk to your care team to make sure it is ok. This medication can increase the risk of poor healing of your surgical site or wound. You will need to stop this medication for 28 days before surgery. After surgery, wait at least 28 days before restarting this medication. Make sure the surgical site or wound is healed enough  before restarting this medication. Talk to your care team if questions. Talk to your care team if you may be pregnant. Serious birth defects can occur if you take this medication during pregnancy and for 6 months after the last dose. Contraception is recommended while taking this medication and for 6 months after the last dose. Your care team can help you find the option that works for you. Do not breastfeed while taking this medication and for 6 months after the last dose. This medication can cause infertility. Talk to your care team if you are concerned about your fertility. What side effects may I notice from receiving this medication? Side effects that you should report to your care team as soon as possible: Allergic reactions--skin rash, itching, hives, swelling of the face, lips, tongue, or throat Bleeding--bloody or black, tar-like stools, vomiting blood or brown material that looks like coffee grounds, red or dark brown urine, small red or purple spots on skin, unusual bruising or bleeding Blood clot--pain, swelling, or warmth in the leg, shortness of breath, chest pain Heart attack--pain or tightness in the chest, shoulders, arms, or jaw, nausea, shortness of breath, cold or clammy skin, feeling faint or lightheaded Heart failure--shortness of breath, swelling of the ankles, feet, or hands, sudden weight gain,  unusual weakness or fatigue Increase in blood pressure Infection--fever, chills, cough, sore throat, wounds that don't heal, pain or trouble when passing urine, general feeling of discomfort or being unwell Infusion reactions--chest pain, shortness of breath or trouble breathing, feeling faint or lightheaded Kidney injury--decrease in the amount of urine, swelling of the ankles, hands, or feet Stomach pain that is severe, does not go away, or gets worse Stroke--sudden numbness or weakness of the face, arm, or leg, trouble speaking, confusion, trouble walking, loss of balance or  coordination, dizziness, severe headache, change in vision Sudden and severe headache, confusion, change in vision, seizures, which may be signs of posterior reversible encephalopathy syndrome (PRES) Side effects that usually do not require medical attention (report to your care team if they continue or are bothersome): Back pain Change in taste Diarrhea Dry skin Increased tears Nosebleed This list may not describe all possible side effects. Call your doctor for medical advice about side effects. You may report side effects to FDA at 1-800-FDA-1088. Where should I keep my medication? This medication is given in a hospital or clinic. It will not be stored at home. NOTE: This sheet is a summary. It may not cover all possible information. If you have questions about this medicine, talk to your doctor, pharmacist, or health care provider.  2023 Elsevier/Gold Standard (2022-03-09 00:00:00)  Fluorouracil Injection What is this medication? FLUOROURACIL (flure oh YOOR a sil) treats some types of cancer. It works by slowing down the growth of cancer cells. This medicine may be used for other purposes; ask your health care provider or pharmacist if you have questions. COMMON BRAND NAME(S): Adrucil What should I tell my care team before I take this medication? They need to know if you have any of these conditions: Blood disorders Dihydropyrimidine dehydrogenase (DPD) deficiency Infection, such as chickenpox, cold sores, herpes Kidney disease Liver disease Poor nutrition Recent or ongoing radiation therapy An unusual or allergic reaction to fluorouracil, other medications, foods, dyes, or preservatives If you or your partner are pregnant or trying to get pregnant Breast-feeding How should I use this medication? This medication is injected into a vein. It is administered by your care team in a hospital or clinic setting. Talk to your care team about the use of this medication in children.  Special care may be needed. Overdosage: If you think you have taken too much of this medicine contact a poison control center or emergency room at once. NOTE: This medicine is only for you. Do not share this medicine with others. What if I miss a dose? Keep appointments for follow-up doses. It is important not to miss your dose. Call your care team if you are unable to keep an appointment. What may interact with this medication? Do not take this medication with any of the following: Live virus vaccines This medication may also interact with the following: Medications that treat or prevent blood clots, such as warfarin, enoxaparin, dalteparin This list may not describe all possible interactions. Give your health care provider a list of all the medicines, herbs, non-prescription drugs, or dietary supplements you use. Also tell them if you smoke, drink alcohol, or use illegal drugs. Some items may interact with your medicine. What should I watch for while using this medication? Your condition will be monitored carefully while you are receiving this medication. This medication may make you feel generally unwell. This is not uncommon as chemotherapy can affect healthy cells as well as cancer cells. Report any side effects. Continue  your course of treatment even though you feel ill unless your care team tells you to stop. In some cases, you may be given additional medications to help with side effects. Follow all directions for their use. This medication may increase your risk of getting an infection. Call your care team for advice if you get a fever, chills, sore throat, or other symptoms of a cold or flu. Do not treat yourself. Try to avoid being around people who are sick. This medication may increase your risk to bruise or bleed. Call your care team if you notice any unusual bleeding. Be careful brushing or flossing your teeth or using a toothpick because you may get an infection or bleed more easily.  If you have any dental work done, tell your dentist you are receiving this medication. Avoid taking medications that contain aspirin, acetaminophen, ibuprofen, naproxen, or ketoprofen unless instructed by your care team. These medications may hide a fever. Do not treat diarrhea with over the counter products. Contact your care team if you have diarrhea that lasts more than 2 days or if it is severe and watery. This medication can make you more sensitive to the sun. Keep out of the sun. If you cannot avoid being in the sun, wear protective clothing and sunscreen. Do not use sun lamps, tanning beds, or tanning booths. Talk to your care team if you or your partner wish to become pregnant or think you might be pregnant. This medication can cause serious birth defects if taken during pregnancy and for 3 months after the last dose. A reliable form of contraception is recommended while taking this medication and for 3 months after the last dose. Talk to your care team about effective forms of contraception. Do not father a child while taking this medication and for 3 months after the last dose. Use a condom while having sex during this time period. Do not breastfeed while taking this medication. This medication may cause infertility. Talk to your care team if you are concerned about your fertility. What side effects may I notice from receiving this medication? Side effects that you should report to your care team as soon as possible: Allergic reactions--skin rash, itching, hives, swelling of the face, lips, tongue, or throat Heart attack--pain or tightness in the chest, shoulders, arms, or jaw, nausea, shortness of breath, cold or clammy skin, feeling faint or lightheaded Heart failure--shortness of breath, swelling of the ankles, feet, or hands, sudden weight gain, unusual weakness or fatigue Heart rhythm changes--fast or irregular heartbeat, dizziness, feeling faint or lightheaded, chest pain, trouble  breathing High ammonia level--unusual weakness or fatigue, confusion, loss of appetite, nausea, vomiting, seizures Infection--fever, chills, cough, sore throat, wounds that don't heal, pain or trouble when passing urine, general feeling of discomfort or being unwell Low red blood cell level--unusual weakness or fatigue, dizziness, headache, trouble breathing Pain, tingling, or numbness in the hands or feet, muscle weakness, change in vision, confusion or trouble speaking, loss of balance or coordination, trouble walking, seizures Redness, swelling, and blistering of the skin over hands and feet Severe or prolonged diarrhea Unusual bruising or bleeding Side effects that usually do not require medical attention (report to your care team if they continue or are bothersome): Dry skin Headache Increased tears Nausea Pain, redness, or swelling with sores inside the mouth or throat Sensitivity to light Vomiting This list may not describe all possible side effects. Call your doctor for medical advice about side effects. You may report side effects to  FDA at 1-800-FDA-1088. Where should I keep my medication? This medication is given in a hospital or clinic. It will not be stored at home. NOTE: This sheet is a summary. It may not cover all possible information. If you have questions about this medicine, talk to your doctor, pharmacist, or health care provider.  2023 Elsevier/Gold Standard (2022-03-06 00:00:00)  The chemotherapy medication bag should finish at 46 hours, 96 hours, or 7 days. For example, if your pump is scheduled for 46 hours and it was put on at 4:00 p.m., it should finish at 2:00 p.m. the day it is scheduled to come off regardless of your appointment time.     Estimated time to finish at 09:30 on Thursday 04/04/2023.   If the display on your pump reads "Low Volume" and it is beeping, take the batteries out of the pump and come to the cancer center for it to be taken off.   If the  pump alarms go off prior to the pump reading "Low Volume" then call [phone] and someone can assist you.  If the plunger comes out and the chemotherapy medication is leaking out, please use your home chemo spill kit to clean up the spill. Do NOT use paper towels or other household products.  If you have problems or questions regarding your pump, please call either [phone] (24 hours a day) or the cancer center Monday-Friday 8:00 a.m.- 4:30 p.m. at the clinic number and we will assist you. If you are unable to get assistance, then go to the nearest Emergency Department and ask the staff to contact the IV team for assistance.

## 2023-04-03 ENCOUNTER — Other Ambulatory Visit: Payer: Self-pay

## 2023-04-04 ENCOUNTER — Inpatient Hospital Stay: Payer: Medicare Other

## 2023-04-04 VITALS — BP 150/79 | HR 74 | Temp 98.4°F | Resp 18

## 2023-04-04 DIAGNOSIS — R599 Enlarged lymph nodes, unspecified: Secondary | ICD-10-CM | POA: Diagnosis not present

## 2023-04-04 DIAGNOSIS — Z5111 Encounter for antineoplastic chemotherapy: Secondary | ICD-10-CM | POA: Diagnosis not present

## 2023-04-04 DIAGNOSIS — I4891 Unspecified atrial fibrillation: Secondary | ICD-10-CM | POA: Diagnosis not present

## 2023-04-04 DIAGNOSIS — E876 Hypokalemia: Secondary | ICD-10-CM | POA: Diagnosis not present

## 2023-04-04 DIAGNOSIS — G629 Polyneuropathy, unspecified: Secondary | ICD-10-CM | POA: Diagnosis not present

## 2023-04-04 DIAGNOSIS — C185 Malignant neoplasm of splenic flexure: Secondary | ICD-10-CM

## 2023-04-04 DIAGNOSIS — C186 Malignant neoplasm of descending colon: Secondary | ICD-10-CM | POA: Diagnosis not present

## 2023-04-04 DIAGNOSIS — C787 Secondary malignant neoplasm of liver and intrahepatic bile duct: Secondary | ICD-10-CM | POA: Diagnosis not present

## 2023-04-04 DIAGNOSIS — R197 Diarrhea, unspecified: Secondary | ICD-10-CM | POA: Diagnosis not present

## 2023-04-04 MED ORDER — HEPARIN SOD (PORK) LOCK FLUSH 100 UNIT/ML IV SOLN
500.0000 [IU] | Freq: Once | INTRAVENOUS | Status: AC | PRN
  Administered 2023-04-04: 500 [IU]

## 2023-04-04 MED ORDER — SODIUM CHLORIDE 0.9% FLUSH
10.0000 mL | INTRAVENOUS | Status: DC | PRN
  Administered 2023-04-04: 10 mL

## 2023-04-04 NOTE — Patient Instructions (Signed)

## 2023-04-23 ENCOUNTER — Other Ambulatory Visit: Payer: Self-pay

## 2023-04-24 ENCOUNTER — Other Ambulatory Visit (HOSPITAL_COMMUNITY): Payer: Self-pay

## 2023-04-25 ENCOUNTER — Other Ambulatory Visit (HOSPITAL_COMMUNITY): Payer: Self-pay

## 2023-04-28 ENCOUNTER — Other Ambulatory Visit: Payer: Self-pay | Admitting: Oncology

## 2023-04-30 ENCOUNTER — Inpatient Hospital Stay: Payer: Medicare Other | Attending: Oncology

## 2023-04-30 ENCOUNTER — Inpatient Hospital Stay: Payer: Medicare Other | Admitting: Oncology

## 2023-04-30 ENCOUNTER — Inpatient Hospital Stay: Payer: Medicare Other

## 2023-04-30 ENCOUNTER — Encounter: Payer: Self-pay | Admitting: *Deleted

## 2023-04-30 VITALS — BP 151/74 | HR 60

## 2023-04-30 VITALS — BP 150/80 | HR 78 | Temp 98.2°F | Resp 18 | Ht 66.0 in | Wt 158.7 lb

## 2023-04-30 DIAGNOSIS — G620 Drug-induced polyneuropathy: Secondary | ICD-10-CM | POA: Diagnosis not present

## 2023-04-30 DIAGNOSIS — C185 Malignant neoplasm of splenic flexure: Secondary | ICD-10-CM

## 2023-04-30 DIAGNOSIS — R918 Other nonspecific abnormal finding of lung field: Secondary | ICD-10-CM | POA: Diagnosis not present

## 2023-04-30 DIAGNOSIS — E876 Hypokalemia: Secondary | ICD-10-CM | POA: Diagnosis not present

## 2023-04-30 DIAGNOSIS — C787 Secondary malignant neoplasm of liver and intrahepatic bile duct: Secondary | ICD-10-CM | POA: Diagnosis not present

## 2023-04-30 DIAGNOSIS — C186 Malignant neoplasm of descending colon: Secondary | ICD-10-CM | POA: Diagnosis not present

## 2023-04-30 DIAGNOSIS — Z5111 Encounter for antineoplastic chemotherapy: Secondary | ICD-10-CM | POA: Diagnosis not present

## 2023-04-30 LAB — CBC WITH DIFFERENTIAL (CANCER CENTER ONLY)
Abs Immature Granulocytes: 0.02 10*3/uL (ref 0.00–0.07)
Basophils Absolute: 0 10*3/uL (ref 0.0–0.1)
Basophils Relative: 1 %
Eosinophils Absolute: 0.2 10*3/uL (ref 0.0–0.5)
Eosinophils Relative: 5 %
HCT: 39.5 % (ref 36.0–46.0)
Hemoglobin: 13.2 g/dL (ref 12.0–15.0)
Immature Granulocytes: 0 %
Lymphocytes Relative: 32 %
Lymphs Abs: 1.6 10*3/uL (ref 0.7–4.0)
MCH: 35.3 pg — ABNORMAL HIGH (ref 26.0–34.0)
MCHC: 33.4 g/dL (ref 30.0–36.0)
MCV: 105.6 fL — ABNORMAL HIGH (ref 80.0–100.0)
Monocytes Absolute: 0.4 10*3/uL (ref 0.1–1.0)
Monocytes Relative: 8 %
Neutro Abs: 2.8 10*3/uL (ref 1.7–7.7)
Neutrophils Relative %: 54 %
Platelet Count: 198 10*3/uL (ref 150–400)
RBC: 3.74 MIL/uL — ABNORMAL LOW (ref 3.87–5.11)
RDW: 13.3 % (ref 11.5–15.5)
WBC Count: 5.1 10*3/uL (ref 4.0–10.5)
nRBC: 0 % (ref 0.0–0.2)

## 2023-04-30 LAB — CMP (CANCER CENTER ONLY)
ALT: 15 U/L (ref 0–44)
AST: 23 U/L (ref 15–41)
Albumin: 3.9 g/dL (ref 3.5–5.0)
Alkaline Phosphatase: 54 U/L (ref 38–126)
Anion gap: 5 (ref 5–15)
BUN: 30 mg/dL — ABNORMAL HIGH (ref 8–23)
CO2: 27 mmol/L (ref 22–32)
Calcium: 9.2 mg/dL (ref 8.9–10.3)
Chloride: 104 mmol/L (ref 98–111)
Creatinine: 1.08 mg/dL — ABNORMAL HIGH (ref 0.44–1.00)
GFR, Estimated: 52 mL/min — ABNORMAL LOW (ref >60)
Glucose, Bld: 86 mg/dL (ref 70–99)
Potassium: 4.6 mmol/L (ref 3.5–5.1)
Sodium: 136 mmol/L (ref 135–145)
Total Bilirubin: 0.4 mg/dL (ref 0.3–1.2)
Total Protein: 6.5 g/dL (ref 6.5–8.1)

## 2023-04-30 LAB — CEA (ACCESS): CEA (CHCC): 1.3 ng/mL (ref 0.00–5.00)

## 2023-04-30 MED ORDER — SODIUM CHLORIDE 0.9 % IV SOLN
1600.0000 mg/m2 | INTRAVENOUS | Status: DC
  Administered 2023-04-30: 2900 mg via INTRAVENOUS
  Filled 2023-04-30: qty 58

## 2023-04-30 MED ORDER — SODIUM CHLORIDE 0.9 % IV SOLN
7.5000 mg/kg | Freq: Once | INTRAVENOUS | Status: AC
  Administered 2023-04-30: 500 mg via INTRAVENOUS
  Filled 2023-04-30: qty 16

## 2023-04-30 MED ORDER — SODIUM CHLORIDE 0.9 % IV SOLN: Freq: Once | INTRAVENOUS | Status: AC

## 2023-04-30 MED ORDER — SODIUM CHLORIDE 0.9% FLUSH
10.0000 mL | INTRAVENOUS | Status: DC | PRN
  Administered 2023-04-30: 10 mL

## 2023-04-30 NOTE — Progress Notes (Signed)
[facility] OFFICE PROGRESS NOTE   Diagnosis: Colon cancer  INTERVAL HISTORY:   Ms. Crutcher completed another cycle of 5-FU/bevacizumab on 04/02/2023.  No nausea, mouth sores, or diarrhea.  She has persistent peripheral neuropathy symptoms. On day 2 she had a 30-minute episode of palpitations/tachycardia.  No associated symptoms.  The tachycardia spontaneously resolved and has not recurred.  Objective:  Vital signs in last 24 hours:  Blood pressure (!) 150/80, pulse 78, temperature 98.2 F (36.8 C), resp. rate 18, height 5\' 6"  (1.676 m), weight 158 lb 11.2 oz (72 kg), last menstrual period 11/19/1996, SpO2 100 %.    HEENT: No thrush or ulcers Resp: Lungs clear bilaterally Cardio: Regular rate and rhythm GI: No hepatosplenomegaly Vascular: No leg edema  Skin: Palms without erythema  Portacath/PICC-without erythema  Lab Results:  Lab Results  Component Value Date   WBC 5.1 04/30/2023   HGB 13.2 04/30/2023   HCT 39.5 04/30/2023   MCV 105.6 (H) 04/30/2023   PLT 198 04/30/2023   NEUTROABS 2.8 04/30/2023    CMP  Lab Results  Component Value Date   NA 136 04/30/2023   K 4.6 04/30/2023   CL 104 04/30/2023   CO2 27 04/30/2023   GLUCOSE 86 04/30/2023   BUN 30 (H) 04/30/2023   CREATININE 1.08 (H) 04/30/2023   CALCIUM 9.2 04/30/2023   PROT 6.5 04/30/2023   ALBUMIN 3.9 04/30/2023   AST 23 04/30/2023   ALT 15 04/30/2023   ALKPHOS 54 04/30/2023   BILITOT 0.4 04/30/2023   GFRNONAA 52 (L) 04/30/2023   GFRAA >60 08/16/2020    Lab Results  Component Value Date   CEA1 1.16 03/29/2021   CEA 1.28 04/02/2023    Medications: I have reviewed the patient's current medications.   Assessment/Plan: Adenocarcinoma the left colon, stage IIIc (pT4b,pN2a), status post a left colectomy 05/30/2020, MSS, TMB 13, BRAF V600E Tumor invades the visceral peritoneum, lymphovascular and perineural invasion present, for tumor deposits, 7/13 lymph nodes, negative resection  margins, no loss of mismatch repair protein expression CT abdomen/pelvis 05/26/2020-long segment of masslike thickening of the descending colon with associated high-grade colonic obstruction, small colonic wall defect with evidence of a focally contained microperforation, retroperitoneal adenopathy, indeterminate small hypodense liver lesions Elevated preoperative CEA PET 06/22/2020-hypermetabolic liver metastases, periportal and periaortic adenopathy, hypermetabolic mediastinal and left supraclavicular nodes Cycle 1 FOLFOX 07/05/20 Cycle 2 FOLFOXIRI (dose-reduced 5FU, no bolus) and Avastin 07/19/20  Cycle 3 FOLFOXIRI (Dose reduced 5-FU, no bolus)/Avastin 08/02/2020 Cycle 4 FOLFOXIRI/Avastin 08/16/2020 (Udenyca added) Cycle 5 FOLFOXIRI/Avastin 08/30/2020 CTs 09/09/2020-diminished size of lymph nodes in the chest, abdomen, and pelvis.  Decreased hepatic metastases.  No new evidence of disease progression, fat density lesion surrounding the left hemicolectomy Cycle 6 FOLFOXIRI/Avastin 09/13/2020 Cycle 7 FOLFOX/Avastin 09/27/2020 (irinotecan held) Cycle 8 FOLFOXIRI/Avastin 10/18/2020  Cycle 9 FOLFOXIRI/Avastin 11/01/2020 Cycle 10 FOLFOXIRI/Avastin 11/22/2020 (oxaliplatin held, irinotecan and 5-FU dose reduced) Cycle 11 FOLFOXIRI/Avastin 12/12/2020 (oxaliplatin held) CTs 12/19/2020-decrease in size of liver metastases, no evidence of disease progression Cycle 12 FOLFOXIRI/Avastin 01/04/2021 (oxaliplatin held) Cycle 13 FOLFOXIRI/Avastin 01/24/2021 (oxaliplatin held) Cycle 14 FOLFOXIRI/Avastin 02/14/2021 (oxaliplatin held) Cycle 15 FOLFOXIRI/Avastin 03/07/2021 (oxaliplatin held) Cycle 16 FOLFOXIRI/Avastin 03/29/2021 (oxaliplatin held) CTs 04/14/2021- decreased size of liver metastases.  No new or progressive findings. Cycle 17 FOLFOXIRI/Avastin 04/19/2021 (oxaliplatin held) Cycle 18 FOLFOXIRI/Avastin 05/09/2021 (oxaliplatin held) Cycle 19 FOLFOXIRI/Avastin 05/30/2021 (oxaliplatin held) Cycle 20 FOLFOXIRI/Avastin  06/20/2021 (oxaliplatin held) Cycle 21 FOLFOXIRI/Avastin 07/11/2021 (oxaliplatin held) CTs 07/28/2021-unchanged subcentimeter liver lesions, no evidence of new metastatic disease Cycle  22 5-FU/ Avastin 08/01/2021 Cycle 23 5-FU/Avastin 08/22/2021 Cycle 24 5-FU/Avastin 09/12/2021 Cycle 25 5-FU/Avastin 10/03/2021 Cycle 26 5-FU/Avastin 10/24/2021 Cycle 27 5-FU/Avastin 11/15/2021 Cycle 28 5-FU/Avastin 12/05/2021 CTs 12/22/2021-grossly similar subcentimeter low-attenuation lesions in the liver compatible with treated metastasis.  No evidence for new metastatic disease in the chest, abdomen or pelvis. Cycle 29 5-FU/Avastin 12/26/2021 Cycle 30 5-FU/Avastin 01/16/2022 Cycle 41 5-FU/Avastin 02/06/2022 Cycle 42 5-FU/Avastin 02/27/2022 Cycle 43 5-FU/Avastin 03/20/2022 Cycle 44 5-FU/Avastin 04/10/2022 CT 04/28/2022-interval development of a 3 mm nodule within the left lower lobe and 3 mm nodule within the right lower lobe, nonspecific.  Unchanged subcentimeter low-attenuation lesions in the liver compatible with treated metastasis. Cycle 45 5-FU/Avastin 05/01/2022 Cycle 46 5-FU/Avastin 05/29/2022 Cycle 47 5-FU/Avastin 06/19/2022 Cycle 48 5-FU/Avastin 07/10/2022 Cycle 49 5-FU/Avastin 07/31/2022 Cycle 50 5-FU/Avastin 08/21/2022 CTs 09/07/2022-no evidence of metastatic disease, tiny lung nodules described as new on previous exam have resolved, no hepatic metastases are appreciated Cycle 51 5-FU/Avastin 09/11/2022 Cycle 52 5-FU/Avastin 10/02/2022 Cycle 53 5-FU/Avastin 10/23/2022 Cycle 54 5-FU/Avastin 11/20/2021 Cycle 55 5-FU/Avastin 12/11/2022 Cycle 56 5-FU/Avastin 01/08/2023 Cycle 57 5-FU/Avastin 02/05/2023 CTs 02/28/2023-no evidence of recurrent or metastatic disease, stable left paracolic gutter interstitial thickening Cycle 58 5-FU/Avastin 03/05/2023 Cycle 59 5-FU/Avastin 04/02/2023 Cycle 60 5-FU/Avastin 04/30/2023     Atrial fibrillation with rapid ventricular response 05/26/2020 Mild neutropenia following cycle 3 FOLFOXIRI,  Udenyca added with cycle 4 Hypokalemia secondary to diarrhea-potassium supplementation starting 09/13/2020 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 Donath appears stable.  She is in clinical remission from colon cancer.  She has been maintained on systemic therapy since August 2021.  She is interested in a treatment break.  The plan is to continue 5-FU/bevacizumab for 2 more cycles and then consider a treatment break.  She understands the high likelihood of developing progressive colon cancer in the future.  She had an episode of tachycardia during the last cycle of 5-fluorouracil.  There were no associated symptoms.  It is unlikely the tachycardia was related to the 5-fluorouracil or bevacizumab, but possible.  She had an episode of atrial fibrillation while in the hospital in 2021.  She will call for recurrent tachycardia.  Ms. Brist will return for an office visit and 5-FU/bevacizumab in 1 month.    Thornton Papas, MD  04/30/2023  11:36 AM

## 2023-04-30 NOTE — Patient Instructions (Signed)

## 2023-04-30 NOTE — Progress Notes (Signed)
Patient seen by Dr. Truett Perna today  Vitals are within treatment parameters.No intervention for BP 150/80  Labs reviewed by Dr. Truett Perna and are within treatment parameters.  Per physician team, patient is ready for treatment and there are NO modifications to the treatment plan.

## 2023-04-30 NOTE — Patient Instructions (Addendum)
[facility]   Discharge Instructions: Thank you for choosing [facility] to provide your oncology and hematology care.   If you have a lab appointment with the [facility], please go directly to the [facility] and check in at the registration area.   Wear comfortable clothing and clothing appropriate for easy access to any Portacath or PICC line.   We strive to give you quality time with your provider. You may need to reschedule your appointment if you arrive late (15 or more minutes).  Arriving late affects you and other patients whose appointments are after yours.  Also, if you miss three or more appointments without notifying the office, you may be dismissed from the clinic at the provider's discretion.      For prescription refill requests, have your pharmacy contact our office and allow 72 hours for refills to be completed.    Today you received the following chemotherapy and/or immunotherapy agents Bevacizumab-bvzr (ZIRABEV) & Flourouracil (ADRUCIL).      To help prevent nausea and vomiting after your treatment, we encourage you to take your nausea medication as directed.  BELOW ARE SYMPTOMS THAT SHOULD BE REPORTED IMMEDIATELY: *FEVER GREATER THAN 100.4 F (38 C) OR HIGHER *CHILLS OR SWEATING *NAUSEA AND VOMITING THAT IS NOT CONTROLLED WITH YOUR NAUSEA MEDICATION *UNUSUAL SHORTNESS OF BREATH *UNUSUAL BRUISING OR BLEEDING *URINARY PROBLEMS (pain or burning when urinating, or frequent urination) *BOWEL PROBLEMS (unusual diarrhea, constipation, pain near the anus) TENDERNESS IN MOUTH AND THROAT WITH OR WITHOUT PRESENCE OF ULCERS (sore throat, sores in mouth, or a toothache) UNUSUAL RASH, SWELLING OR PAIN  UNUSUAL VAGINAL DISCHARGE OR ITCHING   Items with * indicate a potential emergency and should be followed up as soon as possible or go to the Emergency Department if any problems should occur.  Please show the CHEMOTHERAPY ALERT  CARD or IMMUNOTHERAPY ALERT CARD at check-in to the Emergency Department and triage nurse.  Should you have questions after your visit or need to cancel or reschedule your appointment, please contact [facility]  Dept: [phone]  and follow the prompts.  Office hours are 8:00 a.m. to 4:30 p.m. Monday - Friday. Please note that voicemails left after 4:00 p.m. may not be returned until the following business day.  We are closed weekends and major holidays. You have access to a nurse at all times for urgent questions. Please call the main number to the clinic Dept: [phone] and follow the prompts.   For any non-urgent questions, you may also contact your provider using MyChart. We now offer e-Visits for anyone 74 and older to request care online for non-urgent symptoms. For details visit [email or web address].   Also download the MyChart app! Go to the app store, search "MyChart", open the app, select [facility], and log in with your MyChart username and password.  Bevacizumab Injection What is this medication? BEVACIZUMAB (be va SIZ yoo mab) treats some types of cancer. It works by blocking a protein that causes cancer cells to grow and multiply. This helps to slow or stop the spread of cancer cells. It is a monoclonal antibody. This medicine may be used for other purposes; ask your health care provider or pharmacist if you have questions. COMMON BRAND NAME(S): Alymsys, Avastin, MVASI, [name] What should I tell my care team before I take this medication? They need to know if you have any of these conditions: Blood clots Coughing up blood Having or  recent surgery Heart failure High blood pressure History of a connection between 2 or more body parts that do not usually connect (fistula) History of a tear in your stomach or intestines Protein in your urine An unusual or allergic reaction to bevacizumab, other medications, foods, dyes, or  preservatives Pregnant or trying to get pregnant Breast-feeding How should I use this medication? This medication is injected into a vein. It is given by your care team in a hospital or clinic setting. Talk to your care team the use of this medication in children. Special care may be needed. Overdosage: If you think you have taken too much of this medicine contact a poison control center or emergency room at once. NOTE: This medicine is only for you. Do not share this medicine with others. What if I miss a dose? Keep appointments for follow-up doses. It is important not to miss your dose. Call your care team if you are unable to keep an appointment. What may interact with this medication? Interactions are not expected. This list may not describe all possible interactions. Give your health care provider a list of all the medicines, herbs, non-prescription drugs, or dietary supplements you use. Also tell them if you smoke, drink alcohol, or use illegal drugs. Some items may interact with your medicine. What should I watch for while using this medication? Your condition will be monitored carefully while you are receiving this medication. You may need blood work while taking this medication. This medication may make you feel generally unwell. This is not uncommon as chemotherapy can affect healthy cells as well as cancer cells. Report any side effects. Continue your course of treatment even though you feel ill unless your care team tells you to stop. This medication may increase your risk to bruise or bleed. Call your care team if you notice any unusual bleeding. Before having surgery, talk to your care team to make sure it is ok. This medication can increase the risk of poor healing of your surgical site or wound. You will need to stop this medication for 28 days before surgery. After surgery, wait at least 28 days before restarting this medication. Make sure the surgical site or wound is healed enough  before restarting this medication. Talk to your care team if questions. Talk to your care team if you may be pregnant. Serious birth defects can occur if you take this medication during pregnancy and for 6 months after the last dose. Contraception is recommended while taking this medication and for 6 months after the last dose. Your care team can help you find the option that works for you. Do not breastfeed while taking this medication and for 6 months after the last dose. This medication can cause infertility. Talk to your care team if you are concerned about your fertility. What side effects may I notice from receiving this medication? Side effects that you should report to your care team as soon as possible: Allergic reactions--skin rash, itching, hives, swelling of the face, lips, tongue, or throat Bleeding--bloody or black, tar-like stools, vomiting blood or brown material that looks like coffee grounds, red or dark brown urine, small red or purple spots on skin, unusual bruising or bleeding Blood clot--pain, swelling, or warmth in the leg, shortness of breath, chest pain Heart attack--pain or tightness in the chest, shoulders, arms, or jaw, nausea, shortness of breath, cold or clammy skin, feeling faint or lightheaded Heart failure--shortness of breath, swelling of the ankles, feet, or hands, sudden weight gain,  unusual weakness or fatigue Increase in blood pressure Infection--fever, chills, cough, sore throat, wounds that don't heal, pain or trouble when passing urine, general feeling of discomfort or being unwell Infusion reactions--chest pain, shortness of breath or trouble breathing, feeling faint or lightheaded Kidney injury--decrease in the amount of urine, swelling of the ankles, hands, or feet Stomach pain that is severe, does not go away, or gets worse Stroke--sudden numbness or weakness of the face, arm, or leg, trouble speaking, confusion, trouble walking, loss of balance or  coordination, dizziness, severe headache, change in vision Sudden and severe headache, confusion, change in vision, seizures, which may be signs of posterior reversible encephalopathy syndrome (PRES) Side effects that usually do not require medical attention (report to your care team if they continue or are bothersome): Back pain Change in taste Diarrhea Dry skin Increased tears Nosebleed This list may not describe all possible side effects. Call your doctor for medical advice about side effects. You may report side effects to FDA at 1-800-FDA-1088. Where should I keep my medication? This medication is given in a hospital or clinic. It will not be stored at home. NOTE: This sheet is a summary. It may not cover all possible information. If you have questions about this medicine, talk to your doctor, pharmacist, or health care provider.  2023 Elsevier/Gold Standard (2022-03-09 00:00:00)  Fluorouracil Injection What is this medication? FLUOROURACIL (flure oh YOOR a sil) treats some types of cancer. It works by slowing down the growth of cancer cells. This medicine may be used for other purposes; ask your health care provider or pharmacist if you have questions. COMMON BRAND NAME(S): Adrucil What should I tell my care team before I take this medication? They need to know if you have any of these conditions: Blood disorders Dihydropyrimidine dehydrogenase (DPD) deficiency Infection, such as chickenpox, cold sores, herpes Kidney disease Liver disease Poor nutrition Recent or ongoing radiation therapy An unusual or allergic reaction to fluorouracil, other medications, foods, dyes, or preservatives If you or your partner are pregnant or trying to get pregnant Breast-feeding How should I use this medication? This medication is injected into a vein. It is administered by your care team in a hospital or clinic setting. Talk to your care team about the use of this medication in children.  Special care may be needed. Overdosage: If you think you have taken too much of this medicine contact a poison control center or emergency room at once. NOTE: This medicine is only for you. Do not share this medicine with others. What if I miss a dose? Keep appointments for follow-up doses. It is important not to miss your dose. Call your care team if you are unable to keep an appointment. What may interact with this medication? Do not take this medication with any of the following: Live virus vaccines This medication may also interact with the following: Medications that treat or prevent blood clots, such as warfarin, enoxaparin, dalteparin This list may not describe all possible interactions. Give your health care provider a list of all the medicines, herbs, non-prescription drugs, or dietary supplements you use. Also tell them if you smoke, drink alcohol, or use illegal drugs. Some items may interact with your medicine. What should I watch for while using this medication? Your condition will be monitored carefully while you are receiving this medication. This medication may make you feel generally unwell. This is not uncommon as chemotherapy can affect healthy cells as well as cancer cells. Report any side effects. Continue  your course of treatment even though you feel ill unless your care team tells you to stop. In some cases, you may be given additional medications to help with side effects. Follow all directions for their use. This medication may increase your risk of getting an infection. Call your care team for advice if you get a fever, chills, sore throat, or other symptoms of a cold or flu. Do not treat yourself. Try to avoid being around people who are sick. This medication may increase your risk to bruise or bleed. Call your care team if you notice any unusual bleeding. Be careful brushing or flossing your teeth or using a toothpick because you may get an infection or bleed more easily.  If you have any dental work done, tell your dentist you are receiving this medication. Avoid taking medications that contain aspirin, acetaminophen, ibuprofen, naproxen, or ketoprofen unless instructed by your care team. These medications may hide a fever. Do not treat diarrhea with over the counter products. Contact your care team if you have diarrhea that lasts more than 2 days or if it is severe and watery. This medication can make you more sensitive to the sun. Keep out of the sun. If you cannot avoid being in the sun, wear protective clothing and sunscreen. Do not use sun lamps, tanning beds, or tanning booths. Talk to your care team if you or your partner wish to become pregnant or think you might be pregnant. This medication can cause serious birth defects if taken during pregnancy and for 3 months after the last dose. A reliable form of contraception is recommended while taking this medication and for 3 months after the last dose. Talk to your care team about effective forms of contraception. Do not father a child while taking this medication and for 3 months after the last dose. Use a condom while having sex during this time period. Do not breastfeed while taking this medication. This medication may cause infertility. Talk to your care team if you are concerned about your fertility. What side effects may I notice from receiving this medication? Side effects that you should report to your care team as soon as possible: Allergic reactions--skin rash, itching, hives, swelling of the face, lips, tongue, or throat Heart attack--pain or tightness in the chest, shoulders, arms, or jaw, nausea, shortness of breath, cold or clammy skin, feeling faint or lightheaded Heart failure--shortness of breath, swelling of the ankles, feet, or hands, sudden weight gain, unusual weakness or fatigue Heart rhythm changes--fast or irregular heartbeat, dizziness, feeling faint or lightheaded, chest pain, trouble  breathing High ammonia level--unusual weakness or fatigue, confusion, loss of appetite, nausea, vomiting, seizures Infection--fever, chills, cough, sore throat, wounds that don't heal, pain or trouble when passing urine, general feeling of discomfort or being unwell Low red blood cell level--unusual weakness or fatigue, dizziness, headache, trouble breathing Pain, tingling, or numbness in the hands or feet, muscle weakness, change in vision, confusion or trouble speaking, loss of balance or coordination, trouble walking, seizures Redness, swelling, and blistering of the skin over hands and feet Severe or prolonged diarrhea Unusual bruising or bleeding Side effects that usually do not require medical attention (report to your care team if they continue or are bothersome): Dry skin Headache Increased tears Nausea Pain, redness, or swelling with sores inside the mouth or throat Sensitivity to light Vomiting This list may not describe all possible side effects. Call your doctor for medical advice about side effects. You may report side effects to  FDA at 1-800-FDA-1088. Where should I keep my medication? This medication is given in a hospital or clinic. It will not be stored at home. NOTE: This sheet is a summary. It may not cover all possible information. If you have questions about this medicine, talk to your doctor, pharmacist, or health care provider.  2023 Elsevier/Gold Standard (2022-03-06 00:00:00)  The chemotherapy medication bag should finish at 46 hours, 96 hours, or 7 days. For example, if your pump is scheduled for 46 hours and it was put on at 4:00 p.m., it should finish at 2:00 p.m. the day it is scheduled to come off regardless of your appointment time.     Estimated time to finish at 11am on Thursday 05/02/2023.   If the display on your pump reads "Low Volume" and it is beeping, take the batteries out of the pump and come to the cancer center for it to be taken off.   If the  pump alarms go off prior to the pump reading "Low Volume" then call [phone] and someone can assist you.  If the plunger comes out and the chemotherapy medication is leaking out, please use your home chemo spill kit to clean up the spill. Do NOT use paper towels or other household products.  If you have problems or questions regarding your pump, please call either [phone] (24 hours a day) or the cancer center Monday-Friday 8:00 a.m.- 4:30 p.m. at the clinic number and we will assist you. If you are unable to get assistance, then go to the nearest Emergency Department and ask the staff to contact the IV team for assistance.

## 2023-05-01 ENCOUNTER — Other Ambulatory Visit: Payer: Self-pay

## 2023-05-02 ENCOUNTER — Inpatient Hospital Stay: Payer: Medicare Other

## 2023-05-02 VITALS — BP 141/76 | HR 73 | Temp 98.1°F | Resp 18

## 2023-05-02 DIAGNOSIS — G620 Drug-induced polyneuropathy: Secondary | ICD-10-CM | POA: Diagnosis not present

## 2023-05-02 DIAGNOSIS — C787 Secondary malignant neoplasm of liver and intrahepatic bile duct: Secondary | ICD-10-CM | POA: Diagnosis not present

## 2023-05-02 DIAGNOSIS — Z5111 Encounter for antineoplastic chemotherapy: Secondary | ICD-10-CM | POA: Diagnosis not present

## 2023-05-02 DIAGNOSIS — C186 Malignant neoplasm of descending colon: Secondary | ICD-10-CM | POA: Diagnosis not present

## 2023-05-02 DIAGNOSIS — E876 Hypokalemia: Secondary | ICD-10-CM | POA: Diagnosis not present

## 2023-05-02 DIAGNOSIS — C185 Malignant neoplasm of splenic flexure: Secondary | ICD-10-CM

## 2023-05-02 DIAGNOSIS — R918 Other nonspecific abnormal finding of lung field: Secondary | ICD-10-CM | POA: Diagnosis not present

## 2023-05-02 MED ORDER — SODIUM CHLORIDE 0.9% FLUSH
10.0000 mL | INTRAVENOUS | Status: DC | PRN
  Administered 2023-05-02: 10 mL

## 2023-05-02 MED ORDER — HEPARIN SOD (PORK) LOCK FLUSH 100 UNIT/ML IV SOLN
500.0000 [IU] | Freq: Once | INTRAVENOUS | Status: AC | PRN
  Administered 2023-05-02: 500 [IU]

## 2023-05-02 NOTE — Patient Instructions (Signed)

## 2023-05-28 ENCOUNTER — Inpatient Hospital Stay: Payer: Medicare Other

## 2023-05-28 ENCOUNTER — Inpatient Hospital Stay: Payer: Medicare Other | Attending: Oncology

## 2023-05-28 ENCOUNTER — Inpatient Hospital Stay: Payer: Medicare Other | Admitting: Nurse Practitioner

## 2023-05-28 ENCOUNTER — Encounter: Payer: Self-pay | Admitting: Nurse Practitioner

## 2023-05-28 VITALS — BP 152/74 | HR 76 | Temp 98.1°F | Resp 18 | Ht 66.0 in | Wt 159.8 lb

## 2023-05-28 VITALS — BP 145/68 | HR 58 | Temp 98.2°F | Resp 18

## 2023-05-28 DIAGNOSIS — C787 Secondary malignant neoplasm of liver and intrahepatic bile duct: Secondary | ICD-10-CM | POA: Diagnosis not present

## 2023-05-28 DIAGNOSIS — R599 Enlarged lymph nodes, unspecified: Secondary | ICD-10-CM | POA: Diagnosis not present

## 2023-05-28 DIAGNOSIS — R918 Other nonspecific abnormal finding of lung field: Secondary | ICD-10-CM | POA: Insufficient documentation

## 2023-05-28 DIAGNOSIS — I4891 Unspecified atrial fibrillation: Secondary | ICD-10-CM | POA: Diagnosis not present

## 2023-05-28 DIAGNOSIS — R197 Diarrhea, unspecified: Secondary | ICD-10-CM | POA: Insufficient documentation

## 2023-05-28 DIAGNOSIS — Z5111 Encounter for antineoplastic chemotherapy: Secondary | ICD-10-CM | POA: Diagnosis not present

## 2023-05-28 DIAGNOSIS — C185 Malignant neoplasm of splenic flexure: Secondary | ICD-10-CM | POA: Diagnosis not present

## 2023-05-28 DIAGNOSIS — G629 Polyneuropathy, unspecified: Secondary | ICD-10-CM | POA: Insufficient documentation

## 2023-05-28 DIAGNOSIS — C182 Malignant neoplasm of ascending colon: Secondary | ICD-10-CM | POA: Diagnosis not present

## 2023-05-28 DIAGNOSIS — D709 Neutropenia, unspecified: Secondary | ICD-10-CM | POA: Diagnosis not present

## 2023-05-28 DIAGNOSIS — E876 Hypokalemia: Secondary | ICD-10-CM | POA: Insufficient documentation

## 2023-05-28 LAB — CMP (CANCER CENTER ONLY)
ALT: 14 U/L (ref 0–44)
AST: 21 U/L (ref 15–41)
Albumin: 4 g/dL (ref 3.5–5.0)
Alkaline Phosphatase: 47 U/L (ref 38–126)
Anion gap: 6 (ref 5–15)
BUN: 32 mg/dL — ABNORMAL HIGH (ref 8–23)
CO2: 27 mmol/L (ref 22–32)
Calcium: 9.3 mg/dL (ref 8.9–10.3)
Chloride: 104 mmol/L (ref 98–111)
Creatinine: 1.08 mg/dL — ABNORMAL HIGH (ref 0.44–1.00)
GFR, Estimated: 52 mL/min — ABNORMAL LOW (ref >60)
Glucose, Bld: 88 mg/dL (ref 70–99)
Potassium: 4.6 mmol/L (ref 3.5–5.1)
Sodium: 137 mmol/L (ref 135–145)
Total Bilirubin: 0.4 mg/dL (ref 0.3–1.2)
Total Protein: 6.5 g/dL (ref 6.5–8.1)

## 2023-05-28 LAB — CBC WITH DIFFERENTIAL (CANCER CENTER ONLY)
Abs Immature Granulocytes: 0.01 10*3/uL (ref 0.00–0.07)
Basophils Absolute: 0 10*3/uL (ref 0.0–0.1)
Basophils Relative: 1 %
Eosinophils Absolute: 0.5 10*3/uL (ref 0.0–0.5)
Eosinophils Relative: 8 %
HCT: 39.6 % (ref 36.0–46.0)
Hemoglobin: 13.3 g/dL (ref 12.0–15.0)
Immature Granulocytes: 0 %
Lymphocytes Relative: 30 %
Lymphs Abs: 1.8 10*3/uL (ref 0.7–4.0)
MCH: 35.6 pg — ABNORMAL HIGH (ref 26.0–34.0)
MCHC: 33.6 g/dL (ref 30.0–36.0)
MCV: 105.9 fL — ABNORMAL HIGH (ref 80.0–100.0)
Monocytes Absolute: 0.4 10*3/uL (ref 0.1–1.0)
Monocytes Relative: 7 %
Neutro Abs: 3.2 10*3/uL (ref 1.7–7.7)
Neutrophils Relative %: 54 %
Platelet Count: 199 10*3/uL (ref 150–400)
RBC: 3.74 MIL/uL — ABNORMAL LOW (ref 3.87–5.11)
RDW: 13.2 % (ref 11.5–15.5)
WBC Count: 5.8 10*3/uL (ref 4.0–10.5)
nRBC: 0 % (ref 0.0–0.2)

## 2023-05-28 LAB — CEA (ACCESS): CEA (CHCC): 1.13 ng/mL (ref 0.00–5.00)

## 2023-05-28 LAB — TOTAL PROTEIN, URINE DIPSTICK

## 2023-05-28 MED ORDER — SODIUM CHLORIDE 0.9 % IV SOLN: Freq: Once | INTRAVENOUS | Status: AC

## 2023-05-28 MED ORDER — SODIUM CHLORIDE 0.9% FLUSH
10.0000 mL | INTRAVENOUS | Status: DC | PRN
  Administered 2023-05-28: 10 mL

## 2023-05-28 MED ORDER — SODIUM CHLORIDE 0.9 % IV SOLN
7.5000 mg/kg | Freq: Once | INTRAVENOUS | Status: AC
  Administered 2023-05-28: 500 mg via INTRAVENOUS
  Filled 2023-05-28: qty 16

## 2023-05-28 MED ORDER — SODIUM CHLORIDE 0.9 % IV SOLN
1600.0000 mg/m2 | INTRAVENOUS | Status: DC
  Administered 2023-05-28: 2900 mg via INTRAVENOUS
  Filled 2023-05-28: qty 58

## 2023-05-28 NOTE — Patient Instructions (Addendum)
[facility] AT [facility]   The chemotherapy medication bag should finish at 46 hours, 96 hours, or 7 days. For example, if your pump is scheduled for 46 hours and it was put on at 4:00 p.m., it should finish at 2:00 p.m. the day it is scheduled to come off regardless of your appointment time.     Estimated time to finish at 11:00 Thursday, May 30, 2023.   If the display on your pump reads "Low Volume" and it is beeping, take the batteries out of the pump and come to the cancer center for it to be taken off.   If the pump alarms go off prior to the pump reading "Low Volume" then call [phone] and someone can assist you.  If the plunger comes out and the chemotherapy medication is leaking out, please use your home chemo spill kit to clean up the spill. Do NOT use paper towels or other household products.  If you have problems or questions regarding your pump, please call either [phone] (24 hours a day) or the cancer center Monday-Friday 8:00 a.m.- 4:30 p.m. at the clinic number and we will assist you. If you are unable to get assistance, then go to the nearest Emergency Department and ask the staff to contact the IV team for assistance.  Discharge Instructions: Thank you for choosing [facility] to provide your oncology and hematology care.   If you have a lab appointment with the Cancer Center, please go directly to the Cancer Center and check in at the registration area.   Wear comfortable clothing and clothing appropriate for easy access to any Portacath or PICC line.   We strive to give you quality time with your provider. You may need to reschedule your appointment if you arrive late (15 or more minutes).  Arriving late affects you and other patients whose appointments are after yours.  Also, if you miss three or more appointments without notifying the office, you may be dismissed from the clinic at the provider's discretion.      For  prescription refill requests, have your pharmacy contact our office and allow 72 hours for refills to be completed.    Today you received the following chemotherapy and/or immunotherapy agents Avastin, Fluorouracil.      To help prevent nausea and vomiting after your treatment, we encourage you to take your nausea medication as directed.  BELOW ARE SYMPTOMS THAT SHOULD BE REPORTED IMMEDIATELY: *FEVER GREATER THAN 100.4 F (38 C) OR HIGHER *CHILLS OR SWEATING *NAUSEA AND VOMITING THAT IS NOT CONTROLLED WITH YOUR NAUSEA MEDICATION *UNUSUAL SHORTNESS OF BREATH *UNUSUAL BRUISING OR BLEEDING *URINARY PROBLEMS (pain or burning when urinating, or frequent urination) *BOWEL PROBLEMS (unusual diarrhea, constipation, pain near the anus) TENDERNESS IN MOUTH AND THROAT WITH OR WITHOUT PRESENCE OF ULCERS (sore throat, sores in mouth, or a toothache) UNUSUAL RASH, SWELLING OR PAIN  UNUSUAL VAGINAL DISCHARGE OR ITCHING   Items with * indicate a potential emergency and should be followed up as soon as possible or go to the Emergency Department if any problems should occur.  Please show the CHEMOTHERAPY ALERT CARD or IMMUNOTHERAPY ALERT CARD at check-in to the Emergency Department and triage nurse.  Should you have questions after your visit or need to cancel or reschedule your appointment, please contact [facility]  Dept: [phone]  and follow the prompts.  Office hours are 8:00 a.m. to 4:30 p.m. Monday - Friday. Please note that  voicemails left after 4:00 p.m. may not be returned until the following business day.  We are closed weekends and major holidays. You have access to a nurse at all times for urgent questions. Please call the main number to the clinic Dept: [phone] and follow the prompts.   For any non-urgent questions, you may also contact your provider using MyChart. We now offer e-Visits for anyone 81 and older to request care online for non-urgent  symptoms. For details visit [email or web address].   Also download the MyChart app! Go to the app store, search "MyChart", open the app, select , and log in with your MyChart username and password.  Bevacizumab Injection What is this medication? BEVACIZUMAB (be va SIZ yoo mab) treats some types of cancer. It works by blocking a protein that causes cancer cells to grow and multiply. This helps to slow or stop the spread of cancer cells. It is a monoclonal antibody. This medicine may be used for other purposes; ask your health care provider or pharmacist if you have questions. COMMON BRAND NAME(S): Alymsys, Avastin, MVASI, Omer Jack What should I tell my care team before I take this medication? They need to know if you have any of these conditions: Blood clots Coughing up blood Having or recent surgery Heart failure High blood pressure History of a connection between 2 or more body parts that do not usually connect (fistula) History of a tear in your stomach or intestines Protein in your urine An unusual or allergic reaction to bevacizumab, other medications, foods, dyes, or preservatives Pregnant or trying to get pregnant Breast-feeding How should I use this medication? This medication is injected into a vein. It is given by your care team in a hospital or clinic setting. Talk to your care team the use of this medication in children. Special care may be needed. Overdosage: If you think you have taken too much of this medicine contact a poison control center or emergency room at once. NOTE: This medicine is only for you. Do not share this medicine with others. What if I miss a dose? Keep appointments for follow-up doses. It is important not to miss your dose. Call your care team if you are unable to keep an appointment. What may interact with this medication? Interactions are not expected. This list may not describe all possible interactions. Give your health care provider a  list of all the medicines, herbs, non-prescription drugs, or dietary supplements you use. Also tell them if you smoke, drink alcohol, or use illegal drugs. Some items may interact with your medicine. What should I watch for while using this medication? Your condition will be monitored carefully while you are receiving this medication. You may need blood work while taking this medication. This medication may make you feel generally unwell. This is not uncommon as chemotherapy can affect healthy cells as well as cancer cells. Report any side effects. Continue your course of treatment even though you feel ill unless your care team tells you to stop. This medication may increase your risk to bruise or bleed. Call your care team if you notice any unusual bleeding. Before having surgery, talk to your care team to make sure it is ok. This medication can increase the risk of poor healing of your surgical site or wound. You will need to stop this medication for 28 days before surgery. After surgery, wait at least 28 days before restarting this medication. Make sure the surgical site or wound is healed enough before restarting this medication.  Talk to your care team if questions. Talk to your care team if you may be pregnant. Serious birth defects can occur if you take this medication during pregnancy and for 6 months after the last dose. Contraception is recommended while taking this medication and for 6 months after the last dose. Your care team can help you find the option that works for you. Do not breastfeed while taking this medication and for 6 months after the last dose. This medication can cause infertility. Talk to your care team if you are concerned about your fertility. What side effects may I notice from receiving this medication? Side effects that you should report to your care team as soon as possible: Allergic reactions--skin rash, itching, hives, swelling of the face, lips, tongue, or  throat Bleeding--bloody or black, tar-like stools, vomiting blood or brown material that looks like coffee grounds, red or dark brown urine, small red or purple spots on skin, unusual bruising or bleeding Blood clot--pain, swelling, or warmth in the leg, shortness of breath, chest pain Heart attack--pain or tightness in the chest, shoulders, arms, or jaw, nausea, shortness of breath, cold or clammy skin, feeling faint or lightheaded Heart failure--shortness of breath, swelling of the ankles, feet, or hands, sudden weight gain, unusual weakness or fatigue Increase in blood pressure Infection--fever, chills, cough, sore throat, wounds that don't heal, pain or trouble when passing urine, general feeling of discomfort or being unwell Infusion reactions--chest pain, shortness of breath or trouble breathing, feeling faint or lightheaded Kidney injury--decrease in the amount of urine, swelling of the ankles, hands, or feet Stomach pain that is severe, does not go away, or gets worse Stroke--sudden numbness or weakness of the face, arm, or leg, trouble speaking, confusion, trouble walking, loss of balance or coordination, dizziness, severe headache, change in vision Sudden and severe headache, confusion, change in vision, seizures, which may be signs of posterior reversible encephalopathy syndrome (PRES) Side effects that usually do not require medical attention (report to your care team if they continue or are bothersome): Back pain Change in taste Diarrhea Dry skin Increased tears Nosebleed This list may not describe all possible side effects. Call your doctor for medical advice about side effects. You may report side effects to FDA at 1-800-FDA-1088. Where should I keep my medication? This medication is given in a hospital or clinic. It will not be stored at home. NOTE: This sheet is a summary. It may not cover all possible information. If you have questions about this medicine, talk to your doctor,  pharmacist, or health care provider.  2024 Elsevier/Gold Standard (2022-03-23 00:00:00) Fluorouracil Injection What is this medication? FLUOROURACIL (flure oh YOOR a sil) treats some types of cancer. It works by slowing down the growth of cancer cells. This medicine may be used for other purposes; ask your health care provider or pharmacist if you have questions. COMMON BRAND NAME(S): Adrucil What should I tell my care team before I take this medication? They need to know if you have any of these conditions: Blood disorders Dihydropyrimidine dehydrogenase (DPD) deficiency Infection, such as chickenpox, cold sores, herpes Kidney disease Liver disease Poor nutrition Recent or ongoing radiation therapy An unusual or allergic reaction to fluorouracil, other medications, foods, dyes, or preservatives If you or your partner are pregnant or trying to get pregnant Breast-feeding How should I use this medication? This medication is injected into a vein. It is administered by your care team in a hospital or clinic setting. Talk to your care team  about the use of this medication in children. Special care may be needed. Overdosage: If you think you have taken too much of this medicine contact a poison control center or emergency room at once. NOTE: This medicine is only for you. Do not share this medicine with others. What if I miss a dose? Keep appointments for follow-up doses. It is important not to miss your dose. Call your care team if you are unable to keep an appointment. What may interact with this medication? Do not take this medication with any of the following: Live virus vaccines This medication may also interact with the following: Medications that treat or prevent blood clots, such as warfarin, enoxaparin, dalteparin This list may not describe all possible interactions. Give your health care provider a list of all the medicines, herbs, non-prescription drugs, or dietary supplements  you use. Also tell them if you smoke, drink alcohol, or use illegal drugs. Some items may interact with your medicine. What should I watch for while using this medication? Your condition will be monitored carefully while you are receiving this medication. This medication may make you feel generally unwell. This is not uncommon as chemotherapy can affect healthy cells as well as cancer cells. Report any side effects. Continue your course of treatment even though you feel ill unless your care team tells you to stop. In some cases, you may be given additional medications to help with side effects. Follow all directions for their use. This medication may increase your risk of getting an infection. Call your care team for advice if you get a fever, chills, sore throat, or other symptoms of a cold or flu. Do not treat yourself. Try to avoid being around people who are sick. This medication may increase your risk to bruise or bleed. Call your care team if you notice any unusual bleeding. Be careful brushing or flossing your teeth or using a toothpick because you may get an infection or bleed more easily. If you have any dental work done, tell your dentist you are receiving this medication. Avoid taking medications that contain aspirin, acetaminophen, ibuprofen, naproxen, or ketoprofen unless instructed by your care team. These medications may hide a fever. Do not treat diarrhea with over the counter products. Contact your care team if you have diarrhea that lasts more than 2 days or if it is severe and watery. This medication can make you more sensitive to the sun. Keep out of the sun. If you cannot avoid being in the sun, wear protective clothing and sunscreen. Do not use sun lamps, tanning beds, or tanning booths. Talk to your care team if you or your partner wish to become pregnant or think you might be pregnant. This medication can cause serious birth defects if taken during pregnancy and for 3 months after  the last dose. A reliable form of contraception is recommended while taking this medication and for 3 months after the last dose. Talk to your care team about effective forms of contraception. Do not father a child while taking this medication and for 3 months after the last dose. Use a condom while having sex during this time period. Do not breastfeed while taking this medication. This medication may cause infertility. Talk to your care team if you are concerned about your fertility. What side effects may I notice from receiving this medication? Side effects that you should report to your care team as soon as possible: Allergic reactions--skin rash, itching, hives, swelling of the face, lips, tongue, or  throat Heart attack--pain or tightness in the chest, shoulders, arms, or jaw, nausea, shortness of breath, cold or clammy skin, feeling faint or lightheaded Heart failure--shortness of breath, swelling of the ankles, feet, or hands, sudden weight gain, unusual weakness or fatigue Heart rhythm changes--fast or irregular heartbeat, dizziness, feeling faint or lightheaded, chest pain, trouble breathing High ammonia level--unusual weakness or fatigue, confusion, loss of appetite, nausea, vomiting, seizures Infection--fever, chills, cough, sore throat, wounds that don't heal, pain or trouble when passing urine, general feeling of discomfort or being unwell Low red blood cell level--unusual weakness or fatigue, dizziness, headache, trouble breathing Pain, tingling, or numbness in the hands or feet, muscle weakness, change in vision, confusion or trouble speaking, loss of balance or coordination, trouble walking, seizures Redness, swelling, and blistering of the skin over hands and feet Severe or prolonged diarrhea Unusual bruising or bleeding Side effects that usually do not require medical attention (report to your care team if they continue or are bothersome): Dry skin Headache Increased  tears Nausea Pain, redness, or swelling with sores inside the mouth or throat Sensitivity to light Vomiting This list may not describe all possible side effects. Call your doctor for medical advice about side effects. You may report side effects to FDA at 1-800-FDA-1088. Where should I keep my medication? This medication is given in a hospital or clinic. It will not be stored at home. NOTE: This sheet is a summary. It may not cover all possible information. If you have questions about this medicine, talk to your doctor, pharmacist, or health care provider.  2024 Elsevier/Gold Standard (2022-03-13 00:00:00)

## 2023-05-28 NOTE — Progress Notes (Signed)
Patient seen by Lisa Thomas NP today  Vitals are within treatment parameters.  Labs reviewed by Lisa Thomas NP and are within treatment parameters.  Per physician team, patient is ready for treatment and there are NO modifications to the treatment plan.     

## 2023-05-28 NOTE — Patient Instructions (Signed)

## 2023-05-28 NOTE — Progress Notes (Signed)
[facility] OFFICE PROGRESS NOTE   Diagnosis: Colon cancer  INTERVAL HISTORY:   [name] returns as scheduled.  She completed another cycle of 5-FU/bevacizumab 04/30/2023.  She denies nausea/Feng.  No mouth sores.  No diarrhea.  No bleeding.  Stable neuropathy symptoms.  No more episodes of tachycardia.  Objective:  Vital signs in last 24 hours:  Blood pressure (!) 152/74, pulse 76, temperature 98.1 F (36.7 C), temperature source Oral, resp. rate 18, height 5\' 6"  (1.676 m), weight 159 lb 12.8 oz (72.5 kg), last menstrual period 11/19/1996, SpO2 98 %.    HEENT: No thrush or ulcers. Resp: Lungs clear bilaterally. Cardio: Regular rate and rhythm. GI: No hepatosplenomegaly. Vascular: No leg edema. Neuro: Alert and oriented. Skin: Palms without erythema. Port-A-Cath without erythema.  Lab Results:  Lab Results  Component Value Date   WBC 5.8 05/28/2023   HGB 13.3 05/28/2023   HCT 39.6 05/28/2023   MCV 105.9 (H) 05/28/2023   PLT 199 05/28/2023   NEUTROABS 3.2 05/28/2023    Imaging:  No results found.  Medications: I have reviewed the patient's current medications.  Assessment/Plan: Adenocarcinoma the left colon, stage IIIc (pT4b,pN2a), status post a left colectomy 05/30/2020, MSS, TMB 13, BRAF V600E Tumor invades the visceral peritoneum, lymphovascular and perineural invasion present, for tumor deposits, 7/13 lymph nodes, negative resection margins, no loss of mismatch repair protein expression CT abdomen/pelvis 05/26/2020-long segment of masslike thickening of the descending colon with associated high-grade colonic obstruction, small colonic wall defect with evidence of a focally contained microperforation, retroperitoneal adenopathy, indeterminate small hypodense liver lesions Elevated preoperative CEA PET 06/22/2020-hypermetabolic liver metastases, periportal and periaortic adenopathy, hypermetabolic mediastinal and left supraclavicular nodes Cycle 1  FOLFOX 07/05/20 Cycle 2 FOLFOXIRI (dose-reduced 5FU, no bolus) and Avastin 07/19/20  Cycle 3 FOLFOXIRI (Dose reduced 5-FU, no bolus)/Avastin 08/02/2020 Cycle 4 FOLFOXIRI/Avastin 08/16/2020 (Udenyca added) Cycle 5 FOLFOXIRI/Avastin 08/30/2020 CTs 09/09/2020-diminished size of lymph nodes in the chest, abdomen, and pelvis.  Decreased hepatic metastases.  No new evidence of disease progression, fat density lesion surrounding the left hemicolectomy Cycle 6 FOLFOXIRI/Avastin 09/13/2020 Cycle 7 FOLFOX/Avastin 09/27/2020 (irinotecan held) Cycle 8 FOLFOXIRI/Avastin 10/18/2020  Cycle 9 FOLFOXIRI/Avastin 11/01/2020 Cycle 10 FOLFOXIRI/Avastin 11/22/2020 (oxaliplatin held, irinotecan and 5-FU dose reduced) Cycle 11 FOLFOXIRI/Avastin 12/12/2020 (oxaliplatin held) CTs 12/19/2020-decrease in size of liver metastases, no evidence of disease progression Cycle 12 FOLFOXIRI/Avastin 01/04/2021 (oxaliplatin held) Cycle 13 FOLFOXIRI/Avastin 01/24/2021 (oxaliplatin held) Cycle 14 FOLFOXIRI/Avastin 02/14/2021 (oxaliplatin held) Cycle 15 FOLFOXIRI/Avastin 03/07/2021 (oxaliplatin held) Cycle 16 FOLFOXIRI/Avastin 03/29/2021 (oxaliplatin held) CTs 04/14/2021- decreased size of liver metastases.  No new or progressive findings. Cycle 17 FOLFOXIRI/Avastin 04/19/2021 (oxaliplatin held) Cycle 18 FOLFOXIRI/Avastin 05/09/2021 (oxaliplatin held) Cycle 19 FOLFOXIRI/Avastin 05/30/2021 (oxaliplatin held) Cycle 20 FOLFOXIRI/Avastin 06/20/2021 (oxaliplatin held) Cycle 21 FOLFOXIRI/Avastin 07/11/2021 (oxaliplatin held) CTs 07/28/2021-unchanged subcentimeter liver lesions, no evidence of new metastatic disease Cycle 22 5-FU/ Avastin 08/01/2021 Cycle 23 5-FU/Avastin 08/22/2021 Cycle 24 5-FU/Avastin 09/12/2021 Cycle 25 5-FU/Avastin 10/03/2021 Cycle 26 5-FU/Avastin 10/24/2021 Cycle 27 5-FU/Avastin 11/15/2021 Cycle 28 5-FU/Avastin 12/05/2021 CTs 12/22/2021-grossly similar subcentimeter low-attenuation lesions in the liver compatible with treated metastasis.   No evidence for new metastatic disease in the chest, abdomen or pelvis. Cycle 29 5-FU/Avastin 12/26/2021 Cycle 30 5-FU/Avastin 01/16/2022 Cycle 41 5-FU/Avastin 02/06/2022 Cycle 42 5-FU/Avastin 02/27/2022 Cycle 43 5-FU/Avastin 03/20/2022 Cycle 44 5-FU/Avastin 04/10/2022 CT 04/28/2022-interval development of a 3 mm nodule within the left lower lobe and 3 mm nodule within the right lower lobe, nonspecific.  Unchanged subcentimeter low-attenuation lesions in the liver compatible with  treated metastasis. Cycle 45 5-FU/Avastin 05/01/2022 Cycle 46 5-FU/Avastin 05/29/2022 Cycle 47 5-FU/Avastin 06/19/2022 Cycle 48 5-FU/Avastin 07/10/2022 Cycle 49 5-FU/Avastin 07/31/2022 Cycle 50 5-FU/Avastin 08/21/2022 CTs 09/07/2022-no evidence of metastatic disease, tiny lung nodules described as new on previous exam have resolved, no hepatic metastases are appreciated Cycle 51 5-FU/Avastin 09/11/2022 Cycle 52 5-FU/Avastin 10/02/2022 Cycle 53 5-FU/Avastin 10/23/2022 Cycle 54 5-FU/Avastin 11/20/2021 Cycle 55 5-FU/Avastin 12/11/2022 Cycle 56 5-FU/Avastin 01/08/2023 Cycle 57 5-FU/Avastin 02/05/2023 CTs 02/28/2023-no evidence of recurrent or metastatic disease, stable left paracolic gutter interstitial thickening Cycle 58 5-FU/Avastin 03/05/2023 Cycle 59 5-FU/Avastin 04/02/2023 Cycle 60 5-FU/Avastin 04/30/2023 Cycle 61 5-FU/Avastin 05/28/2023     Atrial fibrillation with rapid ventricular response 05/26/2020 Mild neutropenia following cycle 3 FOLFOXIRI, Udenyca added with cycle 4 Hypokalemia secondary to diarrhea-potassium supplementation starting 09/13/2020 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  She is on active treatment with 5-FU/Avastin every 4 weeks.  She continues to tolerate well.  No clinical evidence of disease progression.  Plan to proceed with 5-FU/Avastin today as  scheduled.  Restaging CTs prior to next office visit.  CBC and chemistry panel reviewed.  Labs adequate to proceed as above.  She will return for follow-up in 1 month.  She will contact the office in the interim with any problems.  Lonna Cobb ANP/GNP-BC   05/28/2023  11:02 AM

## 2023-05-30 ENCOUNTER — Inpatient Hospital Stay: Payer: Medicare Other

## 2023-05-30 VITALS — BP 130/77 | HR 74 | Temp 97.4°F | Resp 18

## 2023-05-30 DIAGNOSIS — Z5111 Encounter for antineoplastic chemotherapy: Secondary | ICD-10-CM | POA: Diagnosis not present

## 2023-05-30 DIAGNOSIS — G629 Polyneuropathy, unspecified: Secondary | ICD-10-CM | POA: Diagnosis not present

## 2023-05-30 DIAGNOSIS — D709 Neutropenia, unspecified: Secondary | ICD-10-CM | POA: Diagnosis not present

## 2023-05-30 DIAGNOSIS — C185 Malignant neoplasm of splenic flexure: Secondary | ICD-10-CM

## 2023-05-30 DIAGNOSIS — R599 Enlarged lymph nodes, unspecified: Secondary | ICD-10-CM | POA: Diagnosis not present

## 2023-05-30 DIAGNOSIS — C182 Malignant neoplasm of ascending colon: Secondary | ICD-10-CM | POA: Diagnosis not present

## 2023-05-30 DIAGNOSIS — E876 Hypokalemia: Secondary | ICD-10-CM | POA: Diagnosis not present

## 2023-05-30 DIAGNOSIS — R918 Other nonspecific abnormal finding of lung field: Secondary | ICD-10-CM | POA: Diagnosis not present

## 2023-05-30 DIAGNOSIS — C787 Secondary malignant neoplasm of liver and intrahepatic bile duct: Secondary | ICD-10-CM | POA: Diagnosis not present

## 2023-05-30 DIAGNOSIS — R197 Diarrhea, unspecified: Secondary | ICD-10-CM | POA: Diagnosis not present

## 2023-05-30 DIAGNOSIS — I4891 Unspecified atrial fibrillation: Secondary | ICD-10-CM | POA: Diagnosis not present

## 2023-05-30 MED ORDER — SODIUM CHLORIDE 0.9% FLUSH
10.0000 mL | INTRAVENOUS | Status: DC | PRN
  Administered 2023-05-30: 10 mL

## 2023-05-30 MED ORDER — HEPARIN SOD (PORK) LOCK FLUSH 100 UNIT/ML IV SOLN
500.0000 [IU] | Freq: Once | INTRAVENOUS | Status: AC | PRN
  Administered 2023-05-30: 500 [IU]

## 2023-05-30 NOTE — Patient Instructions (Signed)

## 2023-06-18 ENCOUNTER — Ambulatory Visit (HOSPITAL_COMMUNITY)
Admission: RE | Admit: 2023-06-18 | Discharge: 2023-06-18 | Disposition: A | Discharge status: Home / Self Care | Payer: Medicare Other | Source: Ambulatory Visit | Attending: Nurse Practitioner | Admitting: Nurse Practitioner

## 2023-06-18 ENCOUNTER — Encounter (HOSPITAL_COMMUNITY): Payer: Self-pay

## 2023-06-18 DIAGNOSIS — E041 Nontoxic single thyroid nodule: Secondary | ICD-10-CM | POA: Diagnosis not present

## 2023-06-18 DIAGNOSIS — C185 Malignant neoplasm of splenic flexure: Secondary | ICD-10-CM | POA: Insufficient documentation

## 2023-06-18 DIAGNOSIS — I700 Atherosclerosis of aorta: Secondary | ICD-10-CM | POA: Diagnosis not present

## 2023-06-18 DIAGNOSIS — C189 Malignant neoplasm of colon, unspecified: Secondary | ICD-10-CM | POA: Diagnosis not present

## 2023-06-18 MED ORDER — IOHEXOL 300 MG/ML  SOLN
100.0000 mL | Freq: Once | INTRAMUSCULAR | Status: AC | PRN
  Administered 2023-06-18: 100 mL via INTRAVENOUS

## 2023-06-18 MED ORDER — IOHEXOL 9 MG/ML PO SOLN
ORAL | Status: AC
  Filled 2023-06-18: qty 1000

## 2023-06-18 MED ORDER — IOHEXOL 9 MG/ML PO SOLN
500.0000 mL | ORAL | Status: AC
  Administered 2023-06-18 (×2): 500 mL via ORAL

## 2023-06-23 ENCOUNTER — Other Ambulatory Visit: Payer: Self-pay | Admitting: Oncology

## 2023-06-25 ENCOUNTER — Inpatient Hospital Stay: Payer: Medicare Other

## 2023-06-25 ENCOUNTER — Telehealth: Payer: Self-pay

## 2023-06-25 ENCOUNTER — Inpatient Hospital Stay: Payer: Medicare Other | Attending: Oncology | Admitting: Oncology

## 2023-06-25 DIAGNOSIS — I4891 Unspecified atrial fibrillation: Secondary | ICD-10-CM | POA: Diagnosis not present

## 2023-06-25 DIAGNOSIS — R599 Enlarged lymph nodes, unspecified: Secondary | ICD-10-CM | POA: Diagnosis not present

## 2023-06-25 DIAGNOSIS — Z85038 Personal history of other malignant neoplasm of large intestine: Secondary | ICD-10-CM | POA: Diagnosis not present

## 2023-06-25 DIAGNOSIS — C185 Malignant neoplasm of splenic flexure: Secondary | ICD-10-CM

## 2023-06-25 DIAGNOSIS — R197 Diarrhea, unspecified: Secondary | ICD-10-CM | POA: Insufficient documentation

## 2023-06-25 DIAGNOSIS — D709 Neutropenia, unspecified: Secondary | ICD-10-CM | POA: Insufficient documentation

## 2023-06-25 DIAGNOSIS — G620 Drug-induced polyneuropathy: Secondary | ICD-10-CM | POA: Insufficient documentation

## 2023-06-25 DIAGNOSIS — E876 Hypokalemia: Secondary | ICD-10-CM | POA: Insufficient documentation

## 2023-06-25 DIAGNOSIS — T451X5A Adverse effect of antineoplastic and immunosuppressive drugs, initial encounter: Secondary | ICD-10-CM | POA: Diagnosis not present

## 2023-06-25 LAB — CMP (CANCER CENTER ONLY)
ALT: 16 U/L (ref 0–44)
AST: 24 U/L (ref 15–41)
Albumin: 3.9 g/dL (ref 3.5–5.0)
Alkaline Phosphatase: 52 U/L (ref 38–126)
Anion gap: 6 (ref 5–15)
BUN: 28 mg/dL — ABNORMAL HIGH (ref 8–23)
CO2: 27 mmol/L (ref 22–32)
Calcium: 9.6 mg/dL (ref 8.9–10.3)
Chloride: 104 mmol/L (ref 98–111)
Creatinine: 1.01 mg/dL — ABNORMAL HIGH (ref 0.44–1.00)
GFR, Estimated: 56 mL/min — ABNORMAL LOW (ref >60)
Glucose, Bld: 92 mg/dL (ref 70–99)
Potassium: 4.7 mmol/L (ref 3.5–5.1)
Sodium: 137 mmol/L (ref 135–145)
Total Bilirubin: 0.4 mg/dL (ref 0.3–1.2)
Total Protein: 6.6 g/dL (ref 6.5–8.1)

## 2023-06-25 LAB — CBC WITH DIFFERENTIAL (CANCER CENTER ONLY)
Abs Immature Granulocytes: 0.01 10*3/uL (ref 0.00–0.07)
Basophils Absolute: 0 10*3/uL (ref 0.0–0.1)
Basophils Relative: 1 %
Eosinophils Absolute: 0.3 10*3/uL (ref 0.0–0.5)
Eosinophils Relative: 6 %
HCT: 39.3 % (ref 36.0–46.0)
Hemoglobin: 13.3 g/dL (ref 12.0–15.0)
Immature Granulocytes: 0 %
Lymphocytes Relative: 35 %
Lymphs Abs: 1.7 10*3/uL (ref 0.7–4.0)
MCH: 35.4 pg — ABNORMAL HIGH (ref 26.0–34.0)
MCHC: 33.8 g/dL (ref 30.0–36.0)
MCV: 104.5 fL — ABNORMAL HIGH (ref 80.0–100.0)
Monocytes Absolute: 0.4 10*3/uL (ref 0.1–1.0)
Monocytes Relative: 9 %
Neutro Abs: 2.4 10*3/uL (ref 1.7–7.7)
Neutrophils Relative %: 49 %
Platelet Count: 207 10*3/uL (ref 150–400)
RBC: 3.76 MIL/uL — ABNORMAL LOW (ref 3.87–5.11)
RDW: 13.2 % (ref 11.5–15.5)
WBC Count: 4.9 10*3/uL (ref 4.0–10.5)
nRBC: 0 % (ref 0.0–0.2)

## 2023-06-25 LAB — CEA (ACCESS): CEA (CHCC): 1.24 ng/mL (ref 0.00–5.00)

## 2023-06-25 MED ORDER — SODIUM CHLORIDE 0.9% FLUSH
10.0000 mL | INTRAVENOUS | Status: DC | PRN
  Administered 2023-06-25: 10 mL via INTRAVENOUS

## 2023-06-25 MED ORDER — HEPARIN SOD (PORK) LOCK FLUSH 100 UNIT/ML IV SOLN
500.0000 [IU] | Freq: Once | INTRAVENOUS | Status: AC
  Administered 2023-06-25: 500 [IU] via INTRAVENOUS

## 2023-06-25 NOTE — Progress Notes (Signed)
[facility] OFF[facility] PROGRESS NOTE   Diagnosis: Colon cancer  INTERVAL HISTORY:   Kathy Robinson completed another cycle of 5-FU/bevacizumab 05/28/2023.  No mouth sores, nausea, diarrhea, or symptom of thrombosis.  She continues to have neuropathy symptoms.  Objective:  Vital signs in last 24 hours:  Blood pressure 138/70, pulse 72, temperature 98.1 F (36.7 C), temperature source Oral, resp. rate 18, height 5\' 6"  (1.676 m), weight 157 lb 9.6 oz (71.5 kg), last menstrual period 11/19/1996, SpO2 100%.    HEENT: No thrush or ulcers Lymphatics: Cervical, supraclavicular, axillary, or inguinal nodes Resp: Clear bilaterally Cardio: Regular rate and rhythm GI: No hepatosplenomegaly, no mass, nontender Vascular: No leg edema   Portacath/PICC-without erythema  Lab Results:  Lab Results  Component Value Date   WBC 4.9 06/25/2023   HGB 13.3 06/25/2023   HCT 39.3 06/25/2023   MCV 104.5 (H) 06/25/2023   PLT 207 06/25/2023   NEUTROABS 2.4 06/25/2023    CMP  Lab Results  Component Value Date   NA 137 05/28/2023   K 4.6 05/28/2023   CL 104 05/28/2023   CO2 27 05/28/2023   GLUCOSE 88 05/28/2023   BUN 32 (H) 05/28/2023   CREATININE 1.08 (H) 05/28/2023   CALCIUM 9.3 05/28/2023   PROT 6.5 05/28/2023   ALBUMIN 4.0 05/28/2023   AST 21 05/28/2023   ALT 14 05/28/2023   ALKPHOS 47 05/28/2023   BILITOT 0.4 05/28/2023   GFRNONAA 52 (L) 05/28/2023   GFRAA >60 08/16/2020    Lab Results  Component Value Date   CEA1 1.16 03/29/2021   CEA 1.13 05/28/2023     Medications: I have reviewed the patient's current medications.   Assessment/Plan: Adenocarcinoma the left colon, stage IIIc (pT4b,pN2a), status post a left colectomy 05/30/2020, MSS, TMB 13, BRAF V600E Tumor invades the visceral peritoneum, lymphovascular and perineural invasion present, for tumor deposits, 7/13 lymph nodes, negative resection margins, no loss of mismatch repair protein expression CT  abdomen/pelvis 05/26/2020-long segment of masslike thickening of the descending colon with associated high-grade colonic obstruction, small colonic wall defect with evidence of a focally contained microperforation, retroperitoneal adenopathy, indeterminate small hypodense liver lesions Elevated preoperative CEA PET 06/22/2020-hypermetabolic liver metastases, periportal and periaortic adenopathy, hypermetabolic mediastinal and left supraclavicular nodes Cycle 1 FOLFOX 07/05/20 Cycle 2 FOLFOXIRI (dose-reduced 5FU, no bolus) and Avastin 07/19/20  Cycle 3 FOLFOXIRI (Dose reduced 5-FU, no bolus)/Avastin 08/02/2020 Cycle 4 FOLFOXIRI/Avastin 08/16/2020 (Udenyca added) Cycle 5 FOLFOXIRI/Avastin 08/30/2020 CTs 09/09/2020-diminished size of lymph nodes in the chest, abdomen, and pelvis.  Decreased hepatic metastases.  No new evidence of disease progression, fat density lesion surrounding the left hemicolectomy Cycle 6 FOLFOXIRI/Avastin 09/13/2020 Cycle 7 FOLFOX/Avastin 09/27/2020 (irinotecan held) Cycle 8 FOLFOXIRI/Avastin 10/18/2020  Cycle 9 FOLFOXIRI/Avastin 11/01/2020 Cycle 10 FOLFOXIRI/Avastin 11/22/2020 (oxaliplatin held, irinotecan and 5-FU dose reduced) Cycle 11 FOLFOXIRI/Avastin 12/12/2020 (oxaliplatin held) CTs 12/19/2020-decrease in size of liver metastases, no evidence of disease progression Cycle 12 FOLFOXIRI/Avastin 01/04/2021 (oxaliplatin held) Cycle 13 FOLFOXIRI/Avastin 01/24/2021 (oxaliplatin held) Cycle 14 FOLFOXIRI/Avastin 02/14/2021 (oxaliplatin held) Cycle 15 FOLFOXIRI/Avastin 03/07/2021 (oxaliplatin held) Cycle 16 FOLFOXIRI/Avastin 03/29/2021 (oxaliplatin held) CTs 04/14/2021- decreased size of liver metastases.  No new or progressive findings. Cycle 17 FOLFOXIRI/Avastin 04/19/2021 (oxaliplatin held) Cycle 18 FOLFOXIRI/Avastin 05/09/2021 (oxaliplatin held) Cycle 19 FOLFOXIRI/Avastin 05/30/2021 (oxaliplatin held) Cycle 20 FOLFOXIRI/Avastin 06/20/2021 (oxaliplatin held) Cycle 21 FOLFOXIRI/Avastin 07/11/2021  (oxaliplatin held) CTs 07/28/2021-unchanged subcentimeter liver lesions, no evidence of new metastatic disease Cycle 22 5-FU/ Avastin 08/01/2021 Cycle 23 5-FU/Avastin 08/22/2021 Cycle 24 5-FU/Avastin 09/12/2021 Cycle 25  5-FU/Avastin 10/03/2021 Cycle 26 5-FU/Avastin 10/24/2021 Cycle 27 5-FU/Avastin 11/15/2021 Cycle 28 5-FU/Avastin 12/05/2021 CTs 12/22/2021-grossly similar subcentimeter low-attenuation lesions in the liver compatible with treated metastasis.  No evidence for new metastatic disease in the chest, abdomen or pelvis. Cycle 29 5-FU/Avastin 12/26/2021 Cycle 30 5-FU/Avastin 01/16/2022 Cycle 41 5-FU/Avastin 02/06/2022 Cycle 42 5-FU/Avastin 02/27/2022 Cycle 43 5-FU/Avastin 03/20/2022 Cycle 44 5-FU/Avastin 04/10/2022 CT 04/28/2022-interval development of a 3 mm nodule within the left lower lobe and 3 mm nodule within the right lower lobe, nonspecific.  Unchanged subcentimeter low-attenuation lesions in the liver compatible with treated metastasis. Cycle 45 5-FU/Avastin 05/01/2022 Cycle 46 5-FU/Avastin 05/29/2022 Cycle 47 5-FU/Avastin 06/19/2022 Cycle 48 5-FU/Avastin 07/10/2022 Cycle 49 5-FU/Avastin 07/31/2022 Cycle 50 5-FU/Avastin 08/21/2022 CTs 09/07/2022-no evidence of metastatic disease, tiny lung nodules described as new on previous exam have resolved, no hepatic metastases are appreciated Cycle 51 5-FU/Avastin 09/11/2022 Cycle 52 5-FU/Avastin 10/02/2022 Cycle 53 5-FU/Avastin 10/23/2022 Cycle 54 5-FU/Avastin 11/20/2021 Cycle 55 5-FU/Avastin 12/11/2022 Cycle 56 5-FU/Avastin 01/08/2023 Cycle 57 5-FU/Avastin 02/05/2023 CTs 02/28/2023-no evidence of recurrent or metastatic disease, stable left paracolic gutter interstitial thickening Cycle 58 5-FU/Avastin 03/05/2023 Cycle 59 5-FU/Avastin 04/02/2023 Cycle 60 5-FU/Avastin 04/30/2023 Cycle 61 5-FU/Avastin 05/28/2023 CT 06/18/2023-no evidence of metastatic disease Treatment break starting 06/25/2023     Atrial fibrillation with rapid ventricular response  05/26/2020 Mild neutropenia following cycle 3 FOLFOXIRI, Udenyca added with cycle 4 Hypokalemia secondary to diarrhea-potassium supplementation starting 09/13/2020 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  She is in clinical remission from metastatic colon cancer.  She has been maintained on systemic therapy for the past 3 years.  We discussed treatment options.  We discussed a treatment break and continuing 5-FU/bevacizumab.  She prefers a treatment break.  She understands the likelihood of developing recurrent colon cancer over the next months and few years.  Kathy Robinson will keep the Port-A-Cath in place.  She will return for a Port-A-Cath flush in 6 weeks and an office visit/CEA in 12 weeks.  Thornton Papas, MD  06/25/2023  11:00 AM

## 2023-06-25 NOTE — Progress Notes (Signed)
Kathy Robinson had declined tx today due to incoming weather. Concerned she will not be able to come for pump d/c on 06/27/23

## 2023-06-25 NOTE — Telephone Encounter (Signed)
The patient called to inquire about the possibility of discontinuing her potassium supplement, as she is not undergoing chemotherapy. According to Dr. Truett Perna, it is acceptable for her to stop taking potassium.

## 2023-06-26 ENCOUNTER — Other Ambulatory Visit: Payer: Self-pay

## 2023-06-27 ENCOUNTER — Inpatient Hospital Stay: Payer: Medicare Other

## 2023-07-09 ENCOUNTER — Other Ambulatory Visit: Payer: Self-pay

## 2023-07-22 ENCOUNTER — Other Ambulatory Visit: Payer: Self-pay

## 2023-08-06 ENCOUNTER — Inpatient Hospital Stay: Payer: Medicare Other | Attending: Oncology

## 2023-08-06 ENCOUNTER — Other Ambulatory Visit (HOSPITAL_BASED_OUTPATIENT_CLINIC_OR_DEPARTMENT_OTHER): Payer: Self-pay

## 2023-08-06 DIAGNOSIS — Z85038 Personal history of other malignant neoplasm of large intestine: Secondary | ICD-10-CM | POA: Insufficient documentation

## 2023-08-06 MED ORDER — INFLUENZA VAC A&B SURF ANT ADJ 0.5 ML IM SUSY
0.5000 mL | PREFILLED_SYRINGE | Freq: Once | INTRAMUSCULAR | 0 refills | Status: AC
  Filled 2023-08-06: qty 0.5, 1d supply, fill #0

## 2023-08-06 NOTE — Patient Instructions (Signed)

## 2023-09-10 DIAGNOSIS — E781 Pure hyperglyceridemia: Secondary | ICD-10-CM | POA: Diagnosis not present

## 2023-09-10 DIAGNOSIS — N1831 Chronic kidney disease, stage 3a: Secondary | ICD-10-CM | POA: Diagnosis not present

## 2023-09-10 DIAGNOSIS — E559 Vitamin D deficiency, unspecified: Secondary | ICD-10-CM | POA: Diagnosis not present

## 2023-09-10 DIAGNOSIS — Z79899 Other long term (current) drug therapy: Secondary | ICD-10-CM | POA: Diagnosis not present

## 2023-09-10 DIAGNOSIS — Z0000 Encounter for general adult medical examination without abnormal findings: Secondary | ICD-10-CM | POA: Diagnosis not present

## 2023-09-10 DIAGNOSIS — G620 Drug-induced polyneuropathy: Secondary | ICD-10-CM | POA: Diagnosis not present

## 2023-09-10 DIAGNOSIS — I700 Atherosclerosis of aorta: Secondary | ICD-10-CM | POA: Diagnosis not present

## 2023-09-10 DIAGNOSIS — Z85038 Personal history of other malignant neoplasm of large intestine: Secondary | ICD-10-CM | POA: Diagnosis not present

## 2023-09-17 ENCOUNTER — Inpatient Hospital Stay: Payer: Medicare Other

## 2023-09-17 ENCOUNTER — Inpatient Hospital Stay: Payer: Medicare Other | Attending: Oncology | Admitting: Oncology

## 2023-09-17 VITALS — BP 132/76 | HR 72 | Temp 98.1°F | Resp 18 | Ht 66.0 in | Wt 157.0 lb

## 2023-09-17 DIAGNOSIS — Z452 Encounter for adjustment and management of vascular access device: Secondary | ICD-10-CM | POA: Insufficient documentation

## 2023-09-17 DIAGNOSIS — C185 Malignant neoplasm of splenic flexure: Secondary | ICD-10-CM

## 2023-09-17 DIAGNOSIS — Z79899 Other long term (current) drug therapy: Secondary | ICD-10-CM | POA: Diagnosis not present

## 2023-09-17 DIAGNOSIS — Z85038 Personal history of other malignant neoplasm of large intestine: Secondary | ICD-10-CM | POA: Insufficient documentation

## 2023-09-17 DIAGNOSIS — E781 Pure hyperglyceridemia: Secondary | ICD-10-CM | POA: Diagnosis not present

## 2023-09-17 DIAGNOSIS — E559 Vitamin D deficiency, unspecified: Secondary | ICD-10-CM | POA: Diagnosis not present

## 2023-09-17 LAB — CEA (ACCESS): CEA (CHCC): 1.09 ng/mL (ref 0.00–5.00)

## 2023-09-17 NOTE — Progress Notes (Signed)
[facility] OFFICE PROGRESS NOTE   Diagnosis: Colon cancer  INTERVAL HISTORY:   Kathy Robinson returns as scheduled.  She is currently maintained off of specific therapy for colon cancer.  She feels well.  Stable neuropathy symptoms.  Objective:  Vital signs in last 24 hours:  Blood pressure 132/76, pulse 72, temperature 98.1 F (36.7 C), temperature source Temporal, resp. rate 18, height 5\' 6"  (1.676 m), weight 157 lb (71.2 kg), last menstrual period 11/19/1996, SpO2 99%.    Lymphatics: No cervical, supraclavicular, axillary, or inguinal nodes Resp: Lungs clear bilaterally Cardio: Regular rate and rhythm GI: No hepatosplenomegaly, no apparent ascites, nontender, no mass Vascular: No leg edema    Portacath/PICC-without erythema  Lab Results:  Lab Results  Component Value Date   WBC 4.9 06/25/2023   HGB 13.3 06/25/2023   HCT 39.3 06/25/2023   MCV 104.5 (H) 06/25/2023   PLT 207 06/25/2023   NEUTROABS 2.4 06/25/2023    CMP  Lab Results  Component Value Date   NA 137 06/25/2023   K 4.7 06/25/2023   CL 104 06/25/2023   CO2 27 06/25/2023   GLUCOSE 92 06/25/2023   BUN 28 (H) 06/25/2023   CREATININE 1.01 (H) 06/25/2023   CALCIUM 9.6 06/25/2023   PROT 6.6 06/25/2023   ALBUMIN 3.9 06/25/2023   AST 24 06/25/2023   ALT 16 06/25/2023   ALKPHOS 52 06/25/2023   BILITOT 0.4 06/25/2023   GFRNONAA 56 (L) 06/25/2023   GFRAA >60 08/16/2020    Lab Results  Component Value Date   CEA1 1.16 03/29/2021   CEA 1.09 09/17/2023    Medications: I have reviewed the patient's current medications.   Assessment/Plan: Adenocarcinoma the left colon, stage IIIc (pT4b,pN2a), status post a left colectomy 05/30/2020, MSS, TMB 13, BRAF V600E Tumor invades the visceral peritoneum, lymphovascular and perineural invasion present, for tumor deposits, 7/13 lymph nodes, negative resection margins, no loss of mismatch repair protein expression CT abdomen/pelvis 05/26/2020-long  segment of masslike thickening of the descending colon with associated high-grade colonic obstruction, small colonic wall defect with evidence of a focally contained microperforation, retroperitoneal adenopathy, indeterminate small hypodense liver lesions Elevated preoperative CEA PET 06/22/2020-hypermetabolic liver metastases, periportal and periaortic adenopathy, hypermetabolic mediastinal and left supraclavicular nodes Cycle 1 FOLFOX 07/05/20 Cycle 2 FOLFOXIRI (dose-reduced 5FU, no bolus) and Avastin 07/19/20  Cycle 3 FOLFOXIRI (Dose reduced 5-FU, no bolus)/Avastin 08/02/2020 Cycle 4 FOLFOXIRI/Avastin 08/16/2020 (Udenyca added) Cycle 5 FOLFOXIRI/Avastin 08/30/2020 CTs 09/09/2020-diminished size of lymph nodes in the chest, abdomen, and pelvis.  Decreased hepatic metastases.  No new evidence of disease progression, fat density lesion surrounding the left hemicolectomy Cycle 6 FOLFOXIRI/Avastin 09/13/2020 Cycle 7 FOLFOX/Avastin 09/27/2020 (irinotecan held) Cycle 8 FOLFOXIRI/Avastin 10/18/2020  Cycle 9 FOLFOXIRI/Avastin 11/01/2020 Cycle 10 FOLFOXIRI/Avastin 11/22/2020 (oxaliplatin held, irinotecan and 5-FU dose reduced) Cycle 11 FOLFOXIRI/Avastin 12/12/2020 (oxaliplatin held) CTs 12/19/2020-decrease in size of liver metastases, no evidence of disease progression Cycle 12 FOLFOXIRI/Avastin 01/04/2021 (oxaliplatin held) Cycle 13 FOLFOXIRI/Avastin 01/24/2021 (oxaliplatin held) Cycle 14 FOLFOXIRI/Avastin 02/14/2021 (oxaliplatin held) Cycle 15 FOLFOXIRI/Avastin 03/07/2021 (oxaliplatin held) Cycle 16 FOLFOXIRI/Avastin 03/29/2021 (oxaliplatin held) CTs 04/14/2021- decreased size of liver metastases.  No new or progressive findings. Cycle 17 FOLFOXIRI/Avastin 04/19/2021 (oxaliplatin held) Cycle 18 FOLFOXIRI/Avastin 05/09/2021 (oxaliplatin held) Cycle 19 FOLFOXIRI/Avastin 05/30/2021 (oxaliplatin held) Cycle 20 FOLFOXIRI/Avastin 06/20/2021 (oxaliplatin held) Cycle 21 FOLFOXIRI/Avastin 07/11/2021 (oxaliplatin held) CTs  07/28/2021-unchanged subcentimeter liver lesions, no evidence of new metastatic disease Cycle 22 5-FU/ Avastin 08/01/2021 Cycle 23 5-FU/Avastin 08/22/2021 Cycle 24 5-FU/Avastin 09/12/2021 Cycle 25 5-FU/Avastin 10/03/2021  Cycle 26 5-FU/Avastin 10/24/2021 Cycle 27 5-FU/Avastin 11/15/2021 Cycle 28 5-FU/Avastin 12/05/2021 CTs 12/22/2021-grossly similar subcentimeter low-attenuation lesions in the liver compatible with treated metastasis.  No evidence for new metastatic disease in the chest, abdomen or pelvis. Cycle 29 5-FU/Avastin 12/26/2021 Cycle 30 5-FU/Avastin 01/16/2022 Cycle 41 5-FU/Avastin 02/06/2022 Cycle 42 5-FU/Avastin 02/27/2022 Cycle 43 5-FU/Avastin 03/20/2022 Cycle 44 5-FU/Avastin 04/10/2022 CT 04/28/2022-interval development of a 3 mm nodule within the left lower lobe and 3 mm nodule within the right lower lobe, nonspecific.  Unchanged subcentimeter low-attenuation lesions in the liver compatible with treated metastasis. Cycle 45 5-FU/Avastin 05/01/2022 Cycle 46 5-FU/Avastin 05/29/2022 Cycle 47 5-FU/Avastin 06/19/2022 Cycle 48 5-FU/Avastin 07/10/2022 Cycle 49 5-FU/Avastin 07/31/2022 Cycle 50 5-FU/Avastin 08/21/2022 CTs 09/07/2022-no evidence of metastatic disease, tiny lung nodules described as new on previous exam have resolved, no hepatic metastases are appreciated Cycle 51 5-FU/Avastin 09/11/2022 Cycle 52 5-FU/Avastin 10/02/2022 Cycle 53 5-FU/Avastin 10/23/2022 Cycle 54 5-FU/Avastin 11/20/2021 Cycle 55 5-FU/Avastin 12/11/2022 Cycle 56 5-FU/Avastin 01/08/2023 Cycle 57 5-FU/Avastin 02/05/2023 CTs 02/28/2023-no evidence of recurrent or metastatic disease, stable left paracolic gutter interstitial thickening Cycle 58 5-FU/Avastin 03/05/2023 Cycle 59 5-FU/Avastin 04/02/2023 Cycle 60 5-FU/Avastin 04/30/2023 Cycle 61 5-FU/Avastin 05/28/2023 CT 06/18/2023-no evidence of metastatic disease Treatment break starting 06/25/2023     Atrial fibrillation with rapid ventricular response 05/26/2020 Mild neutropenia  following cycle 3 FOLFOXIRI, Udenyca added with cycle 4 Hypokalemia secondary to diarrhea-potassium supplementation starting 09/13/2020 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  She has been off of treatment since July.  She will return for a Port-A-Cath flush in 6 weeks.  She will be scheduled for an office visit and surveillance imaging in approximately 12 weeks.  Thornton Papas, MD  09/17/2023  12:26 PM

## 2023-09-19 ENCOUNTER — Other Ambulatory Visit: Payer: Self-pay

## 2023-09-30 ENCOUNTER — Other Ambulatory Visit (HOSPITAL_COMMUNITY): Payer: Self-pay

## 2023-09-30 ENCOUNTER — Telehealth: Payer: Self-pay | Admitting: *Deleted

## 2023-09-30 MED ORDER — ROSUVASTATIN CALCIUM 5 MG PO TABS
5.0000 mg | ORAL_TABLET | Freq: Every day | ORAL | 3 refills | Status: DC
  Filled 2023-09-30: qty 90, 90d supply, fill #0
  Filled 2023-12-30: qty 90, 90d supply, fill #1

## 2023-09-30 NOTE — Telephone Encounter (Addendum)
Kathy Robinson reports her PCP wants her to start rosuvastatin and take COQ10. Asking if that is OK with her current therapy of Bevacizumab and 5FU pump?

## 2023-10-01 ENCOUNTER — Other Ambulatory Visit (HOSPITAL_COMMUNITY): Payer: Self-pay

## 2023-10-01 ENCOUNTER — Telehealth: Payer: Self-pay | Admitting: *Deleted

## 2023-10-01 NOTE — Telephone Encounter (Signed)
Informed patient that Dr. Truett Perna said OK to take rosuvastatin and COQ10

## 2023-10-02 ENCOUNTER — Telehealth: Payer: Self-pay | Admitting: *Deleted

## 2023-10-02 NOTE — Telephone Encounter (Signed)
Scheduled CT for 1/20 at [facility] per patient request. She will have her labs/CT using peripheral access per her request. Made aware of prep and when to arrive.

## 2023-10-09 ENCOUNTER — Encounter: Payer: Self-pay | Admitting: Oncology

## 2023-10-09 NOTE — Telephone Encounter (Signed)
Telephone call  

## 2023-10-29 ENCOUNTER — Inpatient Hospital Stay: Payer: Medicare Other | Attending: Oncology

## 2023-10-29 ENCOUNTER — Other Ambulatory Visit (HOSPITAL_COMMUNITY): Payer: Medicare Other

## 2023-10-29 VITALS — BP 129/56 | HR 70 | Temp 97.9°F | Resp 18 | Ht 66.0 in | Wt 160.2 lb

## 2023-10-29 DIAGNOSIS — Z95828 Presence of other vascular implants and grafts: Secondary | ICD-10-CM

## 2023-10-29 DIAGNOSIS — Z452 Encounter for adjustment and management of vascular access device: Secondary | ICD-10-CM | POA: Diagnosis not present

## 2023-10-29 DIAGNOSIS — Z85038 Personal history of other malignant neoplasm of large intestine: Secondary | ICD-10-CM | POA: Diagnosis not present

## 2023-10-29 MED ORDER — HEPARIN SOD (PORK) LOCK FLUSH 100 UNIT/ML IV SOLN
500.0000 [IU] | Freq: Once | INTRAVENOUS | Status: AC
  Administered 2023-10-29: 500 [IU]

## 2023-10-29 MED ORDER — SODIUM CHLORIDE 0.9% FLUSH
10.0000 mL | Freq: Once | INTRAVENOUS | Status: AC
  Administered 2023-10-29: 10 mL

## 2023-10-29 NOTE — Patient Instructions (Signed)

## 2023-11-26 DIAGNOSIS — E7800 Pure hypercholesterolemia, unspecified: Secondary | ICD-10-CM | POA: Diagnosis not present

## 2023-12-02 ENCOUNTER — Encounter: Payer: Self-pay | Admitting: Oncology

## 2023-12-04 ENCOUNTER — Other Ambulatory Visit (HOSPITAL_COMMUNITY): Payer: Self-pay

## 2023-12-04 ENCOUNTER — Encounter: Payer: Self-pay | Admitting: Oncology

## 2023-12-04 DIAGNOSIS — L578 Other skin changes due to chronic exposure to nonionizing radiation: Secondary | ICD-10-CM | POA: Diagnosis not present

## 2023-12-04 DIAGNOSIS — L821 Other seborrheic keratosis: Secondary | ICD-10-CM | POA: Diagnosis not present

## 2023-12-04 DIAGNOSIS — C44599 Other specified malignant neoplasm of skin of other part of trunk: Secondary | ICD-10-CM | POA: Diagnosis not present

## 2023-12-04 DIAGNOSIS — C44509 Unspecified malignant neoplasm of skin of other part of trunk: Secondary | ICD-10-CM | POA: Diagnosis not present

## 2023-12-04 DIAGNOSIS — D0471 Carcinoma in situ of skin of right lower limb, including hip: Secondary | ICD-10-CM | POA: Diagnosis not present

## 2023-12-04 DIAGNOSIS — D485 Neoplasm of uncertain behavior of skin: Secondary | ICD-10-CM | POA: Diagnosis not present

## 2023-12-04 DIAGNOSIS — L309 Dermatitis, unspecified: Secondary | ICD-10-CM | POA: Diagnosis not present

## 2023-12-04 DIAGNOSIS — L570 Actinic keratosis: Secondary | ICD-10-CM | POA: Diagnosis not present

## 2023-12-04 DIAGNOSIS — D229 Melanocytic nevi, unspecified: Secondary | ICD-10-CM | POA: Diagnosis not present

## 2023-12-04 MED ORDER — TRIAMCINOLONE ACETONIDE 0.1 % EX CREA
1.0000 | TOPICAL_CREAM | Freq: Two times a day (BID) | CUTANEOUS | 0 refills | Status: AC
  Filled 2023-12-04: qty 454, 30d supply, fill #0

## 2023-12-09 ENCOUNTER — Other Ambulatory Visit (HOSPITAL_COMMUNITY): Payer: Self-pay

## 2023-12-09 ENCOUNTER — Inpatient Hospital Stay: Payer: Medicare Other

## 2023-12-09 ENCOUNTER — Ambulatory Visit (HOSPITAL_BASED_OUTPATIENT_CLINIC_OR_DEPARTMENT_OTHER)
Admission: RE | Admit: 2023-12-09 | Discharge: 2023-12-09 | Disposition: A | Discharge status: Home / Self Care | Payer: Medicare Other | Source: Ambulatory Visit | Attending: Oncology | Admitting: Oncology

## 2023-12-09 ENCOUNTER — Other Ambulatory Visit: Payer: Medicare Other

## 2023-12-09 ENCOUNTER — Inpatient Hospital Stay: Payer: Medicare Other | Attending: Oncology

## 2023-12-09 DIAGNOSIS — E041 Nontoxic single thyroid nodule: Secondary | ICD-10-CM | POA: Diagnosis not present

## 2023-12-09 DIAGNOSIS — Z9221 Personal history of antineoplastic chemotherapy: Secondary | ICD-10-CM | POA: Insufficient documentation

## 2023-12-09 DIAGNOSIS — E876 Hypokalemia: Secondary | ICD-10-CM | POA: Diagnosis not present

## 2023-12-09 DIAGNOSIS — I700 Atherosclerosis of aorta: Secondary | ICD-10-CM | POA: Diagnosis not present

## 2023-12-09 DIAGNOSIS — C792 Secondary malignant neoplasm of skin: Secondary | ICD-10-CM | POA: Diagnosis not present

## 2023-12-09 DIAGNOSIS — G620 Drug-induced polyneuropathy: Secondary | ICD-10-CM | POA: Insufficient documentation

## 2023-12-09 DIAGNOSIS — C185 Malignant neoplasm of splenic flexure: Secondary | ICD-10-CM | POA: Insufficient documentation

## 2023-12-09 DIAGNOSIS — Z85038 Personal history of other malignant neoplasm of large intestine: Secondary | ICD-10-CM | POA: Diagnosis not present

## 2023-12-09 DIAGNOSIS — C186 Malignant neoplasm of descending colon: Secondary | ICD-10-CM | POA: Diagnosis not present

## 2023-12-09 DIAGNOSIS — I4891 Unspecified atrial fibrillation: Secondary | ICD-10-CM | POA: Insufficient documentation

## 2023-12-09 LAB — BASIC METABOLIC PANEL - CANCER CENTER ONLY
Anion gap: 6 (ref 5–15)
BUN: 21 mg/dL (ref 8–23)
CO2: 27 mmol/L (ref 22–32)
Calcium: 9.1 mg/dL (ref 8.9–10.3)
Chloride: 107 mmol/L (ref 98–111)
Creatinine: 0.92 mg/dL (ref 0.44–1.00)
GFR, Estimated: >60 mL/min (ref >60)
Glucose, Bld: 88 mg/dL (ref 70–99)
Potassium: 4.2 mmol/L (ref 3.5–5.1)
Sodium: 140 mmol/L (ref 135–145)

## 2023-12-09 LAB — CEA (ACCESS): CEA (CHCC): <1 ng/mL (ref 0.00–5.00)

## 2023-12-09 MED ORDER — IOHEXOL 300 MG/ML  SOLN
100.0000 mL | Freq: Once | INTRAMUSCULAR | Status: AC | PRN
  Administered 2023-12-09: 100 mL via INTRAVENOUS

## 2023-12-09 MED ORDER — HEPARIN SOD (PORK) LOCK FLUSH 100 UNIT/ML IV SOLN
500.0000 [IU] | Freq: Once | INTRAVENOUS | Status: AC
  Administered 2023-12-09: 500 [IU] via INTRAVENOUS

## 2023-12-09 NOTE — Patient Instructions (Signed)

## 2023-12-11 ENCOUNTER — Telehealth: Payer: Self-pay | Admitting: *Deleted

## 2023-12-11 NOTE — Telephone Encounter (Signed)
Dermatology group called to report recent biopsy of flank area and results are abnormal and requesting urgent referral. Tissue has been sent out for further testing. Informed group that she is already an established patient with Dr. Truett Perna and is being seen on 1/28 to review recent CT scan. Office will fax all the current information they have.

## 2023-12-17 ENCOUNTER — Inpatient Hospital Stay: Payer: Medicare Other | Admitting: Oncology

## 2023-12-17 VITALS — BP 138/69 | HR 86 | Temp 98.1°F | Resp 18 | Ht 66.0 in | Wt 159.3 lb

## 2023-12-17 DIAGNOSIS — G620 Drug-induced polyneuropathy: Secondary | ICD-10-CM | POA: Diagnosis not present

## 2023-12-17 DIAGNOSIS — Z9221 Personal history of antineoplastic chemotherapy: Secondary | ICD-10-CM | POA: Diagnosis not present

## 2023-12-17 DIAGNOSIS — C185 Malignant neoplasm of splenic flexure: Secondary | ICD-10-CM | POA: Diagnosis not present

## 2023-12-17 DIAGNOSIS — C186 Malignant neoplasm of descending colon: Secondary | ICD-10-CM | POA: Diagnosis not present

## 2023-12-17 DIAGNOSIS — I700 Atherosclerosis of aorta: Secondary | ICD-10-CM | POA: Diagnosis not present

## 2023-12-17 DIAGNOSIS — C792 Secondary malignant neoplasm of skin: Secondary | ICD-10-CM | POA: Diagnosis not present

## 2023-12-17 DIAGNOSIS — E876 Hypokalemia: Secondary | ICD-10-CM | POA: Diagnosis not present

## 2023-12-17 DIAGNOSIS — I4891 Unspecified atrial fibrillation: Secondary | ICD-10-CM | POA: Diagnosis not present

## 2023-12-17 NOTE — Progress Notes (Signed)
[facility] OFFICE PROGRESS NOTE   Diagnosis: Colon cancer  INTERVAL HISTORY:   Ms. Blish returns as scheduled.  She feels well.  She continues to have neuropathy symptoms in the feet.  No new complaint. She noticed a nodule at the left flank skin approximately 1 month ago.  She saw dermatology on 12/04/2023.  Lesions were biopsied at the right anterior thigh and left flank.  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  The biopsy from the left flank returned as metastatic adenocarcinoma, CDX2 and CK20 positive.  Objective:  Vital signs in last 24 hours:  Blood pressure 138/69, pulse 86, temperature 98.1 F (36.7 C), temperature source Temporal, resp. rate 18, height 5\' 6"  (1.676 m), weight 159 lb 4.8 oz (72.3 kg), last menstrual period 11/19/1996, SpO2 100%.    Lymphatics: No cervical, supraclavicular, axillary, or inguinal nodes Resp: Lungs clear bilaterally Cardio: Regular rate and rhythm GI: No hepatosplenomegaly, no mass, nontender Vascular: No leg edema  Skin: Healing biopsy site at the right thigh, 1-1.5 cm erythematous raised lesion at the left flank with a biopsy site eschar  Portacath/PICC-without erythema  Lab Results:  Lab Results  Component Value Date   WBC 4.9 06/25/2023   HGB 13.3 06/25/2023   HCT 39.3 06/25/2023   MCV 104.5 (H) 06/25/2023   PLT 207 06/25/2023   NEUTROABS 2.4 06/25/2023    CMP  Lab Results  Component Value Date   NA 140 12/09/2023   K 4.2 12/09/2023   CL 107 12/09/2023   CO2 27 12/09/2023   GLUCOSE 88 12/09/2023   BUN 21 12/09/2023   CREATININE 0.92 12/09/2023   CALCIUM 9.1 12/09/2023   PROT 6.6 06/25/2023   ALBUMIN 3.9 06/25/2023   AST 24 06/25/2023   ALT 16 06/25/2023   ALKPHOS 52 06/25/2023   BILITOT 0.4 06/25/2023   GFRNONAA >60 12/09/2023   GFRAA >60 08/16/2020    Lab Results  Component Value Date   CEA1 1.16 03/29/2021   CEA <1.00 12/09/2023   Medications: I have  reviewed the patient's current medications.   Assessment/Plan: Adenocarcinoma the left colon, stage IIIc (pT4b,pN2a), status post a left colectomy 05/30/2020, MSS, TMB 13, BRAF V600E Tumor invades the visceral peritoneum, lymphovascular and perineural invasion present, for tumor deposits, 7/13 lymph nodes, negative resection margins, no loss of mismatch repair protein expression CT abdomen/pelvis 05/26/2020-long segment of masslike thickening of the descending colon with associated high-grade colonic obstruction, small colonic wall defect with evidence of a focally contained microperforation, retroperitoneal adenopathy, indeterminate small hypodense liver lesions Elevated preoperative CEA PET 06/22/2020-hypermetabolic liver metastases, periportal and periaortic adenopathy, hypermetabolic mediastinal and left supraclavicular nodes Cycle 1 FOLFOX 07/05/20 Cycle 2 FOLFOXIRI (dose-reduced 5FU, no bolus) and Avastin 07/19/20  Cycle 3 FOLFOXIRI (Dose reduced 5-FU, no bolus)/Avastin 08/02/2020 Cycle 4 FOLFOXIRI/Avastin 08/16/2020 (Udenyca added) Cycle 5 FOLFOXIRI/Avastin 08/30/2020 CTs 09/09/2020-diminished size of lymph nodes in the chest, abdomen, and pelvis.  Decreased hepatic metastases.  No new evidence of disease progression, fat density lesion surrounding the left hemicolectomy Cycle 6 FOLFOXIRI/Avastin 09/13/2020 Cycle 7 FOLFOX/Avastin 09/27/2020 (irinotecan held) Cycle 8 FOLFOXIRI/Avastin 10/18/2020  Cycle 9 FOLFOXIRI/Avastin 11/01/2020 Cycle 10 FOLFOXIRI/Avastin 11/22/2020 (oxaliplatin held, irinotecan and 5-FU dose reduced) Cycle 11 FOLFOXIRI/Avastin 12/12/2020 (oxaliplatin held) CTs 12/19/2020-decrease in size of liver metastases, no evidence of disease progression Cycle 12 FOLFOXIRI/Avastin 01/04/2021 (oxaliplatin held) Cycle 13 FOLFOXIRI/Avastin 01/24/2021 (oxaliplatin held) Cycle 14 FOLFOXIRI/Avastin 02/14/2021 (oxaliplatin held) Cycle 15 FOLFOXIRI/Avastin 03/07/2021 (oxaliplatin held) Cycle 16  FOLFOXIRI/Avastin 03/29/2021 (oxaliplatin  held) CTs 04/14/2021- decreased size of liver metastases.  No new or progressive findings. Cycle 17 FOLFOXIRI/Avastin 04/19/2021 (oxaliplatin held) Cycle 18 FOLFOXIRI/Avastin 05/09/2021 (oxaliplatin held) Cycle 19 FOLFOXIRI/Avastin 05/30/2021 (oxaliplatin held) Cycle 20 FOLFOXIRI/Avastin 06/20/2021 (oxaliplatin held) Cycle 21 FOLFOXIRI/Avastin 07/11/2021 (oxaliplatin held) CTs 07/28/2021-unchanged subcentimeter liver lesions, no evidence of new metastatic disease Cycle 22 5-FU/ Avastin 08/01/2021 Cycle 23 5-FU/Avastin 08/22/2021 Cycle 24 5-FU/Avastin 09/12/2021 Cycle 25 5-FU/Avastin 10/03/2021 Cycle 26 5-FU/Avastin 10/24/2021 Cycle 27 5-FU/Avastin 11/15/2021 Cycle 28 5-FU/Avastin 12/05/2021 CTs 12/22/2021-grossly similar subcentimeter low-attenuation lesions in the liver compatible with treated metastasis.  No evidence for new metastatic disease in the chest, abdomen or pelvis. Cycle 29 5-FU/Avastin 12/26/2021 Cycle 30 5-FU/Avastin 01/16/2022 Cycle 41 5-FU/Avastin 02/06/2022 Cycle 42 5-FU/Avastin 02/27/2022 Cycle 43 5-FU/Avastin 03/20/2022 Cycle 44 5-FU/Avastin 04/10/2022 CT 04/28/2022-interval development of a 3 mm nodule within the left lower lobe and 3 mm nodule within the right lower lobe, nonspecific.  Unchanged subcentimeter low-attenuation lesions in the liver compatible with treated metastasis. Cycle 45 5-FU/Avastin 05/01/2022 Cycle 46 5-FU/Avastin 05/29/2022 Cycle 47 5-FU/Avastin 06/19/2022 Cycle 48 5-FU/Avastin 07/10/2022 Cycle 49 5-FU/Avastin 07/31/2022 Cycle 50 5-FU/Avastin 08/21/2022 CTs 09/07/2022-no evidence of metastatic disease, tiny lung nodules described as new on previous exam have resolved, no hepatic metastases are appreciated Cycle 51 5-FU/Avastin 09/11/2022 Cycle 52 5-FU/Avastin 10/02/2022 Cycle 53 5-FU/Avastin 10/23/2022 Cycle 54 5-FU/Avastin 11/20/2021 Cycle 55 5-FU/Avastin 12/11/2022 Cycle 56 5-FU/Avastin 01/08/2023 Cycle 57 5-FU/Avastin  02/05/2023 CTs 02/28/2023-no evidence of recurrent or metastatic disease, stable left paracolic gutter interstitial thickening Cycle 58 5-FU/Avastin 03/05/2023 Cycle 59 5-FU/Avastin 04/02/2023 Cycle 60 5-FU/Avastin 04/30/2023 Cycle 61 5-FU/Avastin 05/28/2023 CT 06/18/2023-no evidence of metastatic disease Treatment break starting 06/25/2023 CTs 12/09/2023: No evidence of recurrent disease, stable minimal residual fascial thickening of the right paracolic gutter and mild nodular fascial thickening at the left paracolic gutter, (left flank lesion visible and increased compared to a CT 06/18/2023 ( Biopsy of left flank skin lesion 12/04/2023: Metastatic adenocarcinoma, CDX2 and CK20 positive     Atrial fibrillation with rapid ventricular response 05/26/2020 Mild neutropenia following cycle 3 FOLFOXIRI, Udenyca added with cycle 4 Hypokalemia secondary to diarrhea-potassium supplementation starting 09/13/2020 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  She was diagnosed with metastatic colon cancer in July 2021.  She entered complete clinical remission following treatment with chemotherapy and bevacizumab.  The restaging CTs this month revealed no evidence of recurrent disease.  I reviewed the CT images with Ms. Jarold Motto.  I can see the left flank lesion on the January 2025 CT and the lesion appears to have been barely visible on the July 2024 CT.  She has been diagnosed with metastatic colon cancer involving a left flank skin lesion.  We discussed treatment options.  I recommend surgical excision.  We will make referral to Dr. Sheliah Hatch.  Ms. Preslar will return for an office visit and Port-A-Cath flush 01/27/2024.  Thornton Papas, MD  12/17/2023  11:41 AM

## 2023-12-18 ENCOUNTER — Other Ambulatory Visit: Payer: Self-pay

## 2023-12-23 DIAGNOSIS — K080 Exfoliation of teeth due to systemic causes: Secondary | ICD-10-CM | POA: Diagnosis not present

## 2023-12-24 ENCOUNTER — Encounter: Payer: Self-pay | Admitting: Oncology

## 2023-12-24 ENCOUNTER — Other Ambulatory Visit (HOSPITAL_COMMUNITY): Payer: Self-pay

## 2023-12-24 MED ORDER — FLUOROURACIL 5 % EX CREA
TOPICAL_CREAM | CUTANEOUS | 0 refills | Status: DC
  Filled 2023-12-24: qty 40, 20d supply, fill #0

## 2023-12-25 DIAGNOSIS — C189 Malignant neoplasm of colon, unspecified: Secondary | ICD-10-CM | POA: Diagnosis not present

## 2023-12-25 DIAGNOSIS — C792 Secondary malignant neoplasm of skin: Secondary | ICD-10-CM | POA: Diagnosis not present

## 2023-12-26 ENCOUNTER — Encounter: Payer: Self-pay | Admitting: Oncology

## 2023-12-26 ENCOUNTER — Other Ambulatory Visit: Payer: Self-pay | Admitting: Oncology

## 2023-12-26 ENCOUNTER — Ambulatory Visit: Payer: Self-pay | Admitting: General Surgery

## 2023-12-26 ENCOUNTER — Other Ambulatory Visit (HOSPITAL_COMMUNITY): Payer: Self-pay

## 2023-12-26 MED ORDER — LIDOCAINE-PRILOCAINE 2.5-2.5 % EX CREA
1.0000 | TOPICAL_CREAM | CUTANEOUS | 2 refills | Status: DC | PRN
  Filled 2023-12-26: qty 30, 15d supply, fill #0

## 2023-12-26 NOTE — Progress Notes (Signed)
 Sent message, via epic in basket, requesting orders in epic from [facility].

## 2023-12-28 NOTE — Progress Notes (Addendum)
 COVID Vaccine received:  []  No [x]  Yes Date of any COVID positive Test in last 90 days:  None  PCP -Jerrie Morales MD at [facility]  Cardiologist - Luana Rumple, MD   Chest x-ray - CT Chest w/ contrast 04-14-2021  Epic EKG - 06-23-2020   Will repeat   Stress Test -  ECHO - 05-27-2020  Epic Cardiac Cath -  Zio Monitor- 07-04-2020  Epic  PCR screen: []  Ordered & Completed []   No Order but Needs PROFEND     [x]   N/A for this surgery  Surgery Plan:  [x]  Ambulatory   []  Outpatient in bed  []  Admit Anesthesia:    []  General  []  Spinal  []   Choice [x]   MAC  Pacemaker / ICD device [x]  No []  Yes   Spinal Cord Stimulator:[x]  No []  Yes       History of Sleep Apnea? [x]  No []  Yes   CPAP used?- [x]  No []  Yes    Does the patient monitor blood sugar?   [x]  N/A   []  No []  Yes  Patient has: [x]  NO Hx DM   []  Pre-DM   []  DM1  []   DM2  Blood Thinner / Instructions:  none Aspirin Instructions:  none  ERAS Protocol Ordered: []  No  [x]  Yes PRE-SURGERY [x]  ENSURE  []  G2  Patient is to be NPO after: 1045  Dental hx: []  Dentures:  [x]  N/A      []  Bridge or Partial:                   []  Loose or Damaged teeth:   Activity level: Patient is able to climb a flight of stairs without difficulty; [x]  No CP but would have SOB  Patient can perform ADLs without assistance.   Anesthesia review: hx A.fib w/ RVR, has port-a-cath (right upper chest),  HTN  Patient denies shortness of breath, fever, cough and chest pain at PAT appointment.  Patient verbalized understanding and agreement to the Pre-Surgical Instructions that were given to them at this PAT appointment. Patient was also educated of the need to review these PAT instructions again prior to her surgery.I reviewed the appropriate phone numbers to call if they have any and questions or concerns.

## 2023-12-28 NOTE — Patient Instructions (Signed)
 SURGICAL WAITING ROOM VISITATION Patients having surgery or a procedure may have no more than 2 support people in the waiting area - these visitors may rotate in the visitor waiting room.   Due to an increase in RSV and influenza rates and associated hospitalizations, children ages 52 and under may not visit patients in [facility] hospitals. If the patient needs to stay at the hospital during part of their recovery, the visitor guidelines for inpatient rooms apply.  PRE-OP VISITATION  Pre-op nurse will coordinate an appropriate time for 1 support person to accompany the patient in pre-op.  This support person may not rotate.  This visitor will be contacted when the time is appropriate for the visitor to come back in the pre-op area.  Please refer to the [facility] website for the visitor guidelines for Inpatients (after your surgery is over and you are in a regular room).  You are not required to quarantine at this time prior to your surgery. However, you must do this: Hand Hygiene often Do NOT share personal items Notify your provider if you are in close contact with someone who has COVID or you develop fever 100.4 or greater, new onset of sneezing, cough, sore throat, shortness of breath or body aches.  If you test positive for Covid or have been in contact with anyone that has tested positive in the last 10 days please notify you surgeon.    Your procedure is scheduled on:  Monday  January 06, 2024  Report to [facility] Main Entrance: [address] entrance where the [employer] is available.   Report to admitting at:  11:30   AM  Call this number if you have any questions or problems the morning of surgery [phone]  Do not eat food after Midnight the night prior to your surgery/procedure.  After Midnight you may have the following liquids until   10:45 AM  DAY OF SURGERY  Clear Liquid Diet Water  Black Coffee (sugar ok, NO MILK/CREAM OR CREAMERS)  Tea (sugar ok, NO  MILK/CREAM OR CREAMERS) regular and decaf                             Plain Jell-O  with no fruit (NO RED)                                           Fruit ices (not with fruit pulp, NO RED)                                     Popsicles (NO RED)                                                                  Juice: NO CITRUS JUICES: only apple, WHITE grape, WHITE cranberry Sports drinks like Gatorade or Powerade (NO RED)                   The day of surgery:  Drink ONE (1) Pre-Surgery Clear Ensure  at  10:45 AM the morning  of surgery. Drink in one sitting. Do not sip.  This drink was given to you during your hospital pre-op appointment visit. Nothing else to drink after completing the Pre-Surgery Clear Ensure : No candy, chewing gum or throat lozenges.    FOLLOW ANY ADDITIONAL PRE OP INSTRUCTIONS YOU RECEIVED FROM YOUR SURGEON'S OFFICE!!!   Oral Hygiene is also important to reduce your risk of infection.        Remember - BRUSH YOUR TEETH THE MORNING OF SURGERY WITH YOUR REGULAR TOOTHPASTE  Do NOT smoke after Midnight the night before surgery.  STOP TAKING all Vitamins, Herbs and supplements 1 week before your surgery.   Take ONLY these medicines the morning of surgery with A SIP OF WATER : none  You may not have any metal on your body including hair pins, jewelry, and body piercing  Do not wear make-up, lotions, powders, perfumes or deodorant  Do not wear nail polish including gel and S&S, artificial / acrylic nails, or any other type of covering on natural nails including finger and toenails. If you have artificial nails, gel coating, etc., that needs to be removed by a nail salon, Please have this removed prior to surgery. Not doing so may mean that your surgery could be cancelled or delayed if the Surgeon or anesthesia staff feels like they are unable to monitor you safely.   Do not shave 48 hours prior to surgery to avoid nicks in your skin which may contribute to postoperative  infections.    Contacts, Hearing Aids, dentures or bridgework may not be worn into surgery. DENTURES WILL BE REMOVED PRIOR TO SURGERY PLEASE DO NOT APPLY "Poly grip" OR ADHESIVES!!!  Patients discharged on the day of surgery will not be allowed to drive home.  Someone NEEDS to stay with you for the first 24 hours after anesthesia.  Do not bring your home medications to the hospital. The Pharmacy will dispense medications listed on your medication list to you during your admission in the Hospital.  Please read over the following fact sheets you were given: IF YOU HAVE QUESTIONS ABOUT YOUR PRE-OP INSTRUCTIONS, PLEASE CALL [phone]   [facility] - Preparing for Surgery Before surgery, you can play an important role.  Because skin is not sterile, your skin needs to be as free of germs as possible.  You can reduce the number of germs on your skin by washing with CHG (chlorahexidine gluconate) soap before surgery.  CHG is an antiseptic cleaner which kills germs and bonds with the skin to continue killing germs even after washing. Please DO NOT use if you have an allergy to CHG or antibacterial soaps.  If your skin becomes reddened/irritated stop using the CHG and inform your nurse when you arrive at Short Stay. Do not shave (including legs and underarms) for at least 48 hours prior to the first CHG shower.  You may shave your face/neck.  Please follow these instructions carefully:  1.  Shower with CHG Soap the night before surgery and the  morning of surgery.  2.  If you choose to wash your hair, wash your hair first as usual with your normal  shampoo.  3.  After you shampoo, rinse your hair and body thoroughly to remove the shampoo.                             4.  Use CHG as you would any other liquid soap.  You can apply  chg directly to the skin and wash.  Gently with a scrungie or clean washcloth.  5.  Apply the CHG Soap to your body ONLY FROM THE NECK DOWN.   Do not use on face/ open                            Wound or open sores. Avoid contact with eyes, ears mouth and genitals (private parts).                       Wash face,  Genitals (private parts) with your normal soap.             6.  Wash thoroughly, paying special attention to the area where your  surgery  will be performed.  7.  Thoroughly rinse your body with warm water  from the neck down.  8.  DO NOT shower/wash with your normal soap after using and rinsing off the CHG Soap.            9.  Pat yourself dry with a clean towel.            10.  Wear clean pajamas.            11.  Place clean sheets on your bed the night of your first shower and do not  sleep with pets.  ON THE DAY OF SURGERY : Do not apply any lotions/deodorants the morning of surgery.  Please wear clean clothes to the hospital/surgery center.     FAILURE TO FOLLOW THESE INSTRUCTIONS MAY RESULT IN THE CANCELLATION OF YOUR SURGERY  PATIENT SIGNATURE_________________________________  NURSE SIGNATURE__________________________________  ________________________________________________________________________

## 2023-12-30 ENCOUNTER — Other Ambulatory Visit: Payer: Self-pay

## 2023-12-30 ENCOUNTER — Encounter (HOSPITAL_COMMUNITY)
Admission: RE | Admit: 2023-12-30 | Discharge: 2023-12-30 | Disposition: A | Discharge status: Home / Self Care | Payer: Medicare Other | Source: Ambulatory Visit | Attending: General Surgery | Admitting: General Surgery

## 2023-12-30 ENCOUNTER — Encounter (HOSPITAL_COMMUNITY): Payer: Self-pay

## 2023-12-30 ENCOUNTER — Other Ambulatory Visit (HOSPITAL_COMMUNITY): Payer: Self-pay

## 2023-12-30 VITALS — BP 134/68 | HR 84 | Temp 98.3°F | Resp 16 | Ht 66.0 in | Wt 158.0 lb

## 2023-12-30 DIAGNOSIS — I4891 Unspecified atrial fibrillation: Secondary | ICD-10-CM | POA: Diagnosis not present

## 2023-12-30 DIAGNOSIS — Z87891 Personal history of nicotine dependence: Secondary | ICD-10-CM | POA: Diagnosis not present

## 2023-12-30 DIAGNOSIS — Z01818 Encounter for other preprocedural examination: Secondary | ICD-10-CM | POA: Diagnosis not present

## 2023-12-30 DIAGNOSIS — C189 Malignant neoplasm of colon, unspecified: Secondary | ICD-10-CM | POA: Diagnosis not present

## 2023-12-30 DIAGNOSIS — C186 Malignant neoplasm of descending colon: Secondary | ICD-10-CM

## 2023-12-30 HISTORY — DX: Essential (primary) hypertension: I10

## 2023-12-30 HISTORY — DX: Myoneural disorder, unspecified: G70.9

## 2023-12-30 HISTORY — DX: Cardiac arrhythmia, unspecified: I49.9

## 2023-12-30 LAB — BASIC METABOLIC PANEL
Anion gap: 9 (ref 5–15)
BUN: 19 mg/dL (ref 8–23)
CO2: 24 mmol/L (ref 22–32)
Calcium: 9 mg/dL (ref 8.9–10.3)
Chloride: 105 mmol/L (ref 98–111)
Creatinine, Ser: 0.89 mg/dL (ref 0.44–1.00)
GFR, Estimated: >60 mL/min (ref >60)
Glucose, Bld: 97 mg/dL (ref 70–99)
Potassium: 4.4 mmol/L (ref 3.5–5.1)
Sodium: 138 mmol/L (ref 135–145)

## 2023-12-30 LAB — CBC
HCT: 40.8 % (ref 36.0–46.0)
Hemoglobin: 12.9 g/dL (ref 12.0–15.0)
MCH: 32.8 pg (ref 26.0–34.0)
MCHC: 31.6 g/dL (ref 30.0–36.0)
MCV: 103.8 fL — ABNORMAL HIGH (ref 80.0–100.0)
Platelets: 228 10*3/uL (ref 150–400)
RBC: 3.93 MIL/uL (ref 3.87–5.11)
RDW: 12.7 % (ref 11.5–15.5)
WBC: 6 10*3/uL (ref 4.0–10.5)
nRBC: 0 % (ref 0.0–0.2)

## 2023-12-31 NOTE — Progress Notes (Signed)
Anesthesia Chart Review   Case: [id number] Date/Time: 01/06/24 1330   Procedure: EXCISION OF METASTATIC COLON CANCER LEFT ABDOMINAL WALL (Left)   Anesthesia type: Monitor Anesthesia Care   Pre-op diagnosis: METASTATIC COLON CANCER   Location: [facility] 06 / [facility]   Surgeons: Rodman Pickle, MD       DISCUSSION:82 y.o. former smoker with h/o a-fib, metastatic colon cancer scheduled for above procedure 01/06/2024 with Dr. Feliciana Rossetti.   Pt with brief episode of a-fib with rvr during hospitalization in 2021.  Seen by cardiology after hospitalization. Heart rate monitor with no recurrence of a-fib. Anticoagulant not started.   Echocardiogram 05/27/2020  1. Left ventricular ejection fraction, by estimation, is 55 to 60%. The  left ventricle has normal function. The left ventricle has no regional  wall motion abnormalities. Left ventricular diastolic parameters were  normal.   2. Right ventricular systolic function is normal. The right ventricular  size is mildly enlarged.   3. Right atrial size was mildly dilated.   4. The mitral valve is normal in structure. No evidence of mitral valve  regurgitation.   5. The aortic valve is grossly normal. Aortic valve regurgitation is not  visualized.  VS: BP 134/68 Comment: right arm sitting  Pulse 84   Temp 36.8 C (Oral)   Resp 16   Ht 5\' 6"  (1.676 m)   Wt 71.7 kg   LMP 11/19/1996   SpO2 100%   BMI 25.50 kg/m   PROVIDERS: Philip Aspen, Limmie Patricia, MD   LABS: Labs reviewed: Acceptable for surgery. 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 Arlyce Dice   EYE SURGERY Bilateral    cataract extraction   PORTACATH PLACEMENT Right 06/20/2020   Procedure: INSERTION PORT-A-CATH WITH ULTRASOUND;  Surgeon: Rodman Pickle, MD;  Location: [facility];  Service: General;  Laterality: Right;   TONSILLECTOMY      MEDICATIONS:  acetaminophen (TYLENOL) 500 MG tablet   Calcium Carbonate (CALCIUM 500 PO)   fluorouracil (EFUDEX) 5 % cream   lidocaine-prilocaine (EMLA) cream   magnesium oxide (MAG-OX) 400 (241.3 Mg) MG tablet   Polyethylene Glycol 3350 (MIRALAX PO)   potassium chloride (MICRO-K) 10 MEQ CR capsule   prochlorperazine (COMPAZINE) 10 MG tablet   rosuvastatin (CRESTOR) 5 MG tablet   triamcinolone cream (KENALOG) 0.1 %   No current facility-administered medications for this encounter.    Jodell Cipro Ward, PA-C [facility] Pre-Surgical Testing [phone]

## 2024-01-06 ENCOUNTER — Encounter (HOSPITAL_COMMUNITY)
Admission: RE | Disposition: A | Discharge status: Home / Self Care | Payer: Self-pay | Source: Home / Self Care | Attending: General Surgery

## 2024-01-06 ENCOUNTER — Ambulatory Visit (HOSPITAL_COMMUNITY)
Admission: RE | Admit: 2024-01-06 | Discharge: 2024-01-06 | Disposition: A | Discharge status: Home / Self Care | Payer: Medicare Other | Attending: General Surgery | Admitting: General Surgery

## 2024-01-06 ENCOUNTER — Ambulatory Visit (HOSPITAL_BASED_OUTPATIENT_CLINIC_OR_DEPARTMENT_OTHER): Payer: Medicare Other | Admitting: Certified Registered"

## 2024-01-06 ENCOUNTER — Ambulatory Visit (HOSPITAL_COMMUNITY): Payer: Medicare Other | Admitting: Physician Assistant

## 2024-01-06 ENCOUNTER — Other Ambulatory Visit (HOSPITAL_COMMUNITY): Payer: Self-pay

## 2024-01-06 ENCOUNTER — Encounter (HOSPITAL_COMMUNITY): Payer: Self-pay | Admitting: General Surgery

## 2024-01-06 DIAGNOSIS — I4891 Unspecified atrial fibrillation: Secondary | ICD-10-CM

## 2024-01-06 DIAGNOSIS — C792 Secondary malignant neoplasm of skin: Secondary | ICD-10-CM | POA: Insufficient documentation

## 2024-01-06 DIAGNOSIS — I10 Essential (primary) hypertension: Secondary | ICD-10-CM

## 2024-01-06 DIAGNOSIS — C189 Malignant neoplasm of colon, unspecified: Secondary | ICD-10-CM | POA: Diagnosis not present

## 2024-01-06 DIAGNOSIS — Z01818 Encounter for other preprocedural examination: Secondary | ICD-10-CM

## 2024-01-06 DIAGNOSIS — C785 Secondary malignant neoplasm of large intestine and rectum: Secondary | ICD-10-CM | POA: Diagnosis not present

## 2024-01-06 DIAGNOSIS — Z87891 Personal history of nicotine dependence: Secondary | ICD-10-CM | POA: Diagnosis not present

## 2024-01-06 DIAGNOSIS — C7989 Secondary malignant neoplasm of other specified sites: Secondary | ICD-10-CM | POA: Diagnosis not present

## 2024-01-06 DIAGNOSIS — C801 Malignant (primary) neoplasm, unspecified: Secondary | ICD-10-CM | POA: Diagnosis not present

## 2024-01-06 DIAGNOSIS — G709 Myoneural disorder, unspecified: Secondary | ICD-10-CM | POA: Diagnosis not present

## 2024-01-06 HISTORY — PX: EXCISION OF ABDOMINAL WALL TUMOR: SHX6687

## 2024-01-06 SURGERY — EXCISION, NEOPLASM, ABDOMINAL WALL
Anesthesia: Monitor Anesthesia Care | Laterality: Left

## 2024-01-06 MED ORDER — LIDOCAINE 2% (20 MG/ML) 5 ML SYRINGE
INTRAMUSCULAR | Status: DC | PRN
  Administered 2024-01-06: 50 mg via INTRAVENOUS

## 2024-01-06 MED ORDER — CEFAZOLIN SODIUM-DEXTROSE 2-4 GM/100ML-% IV SOLN
2.0000 g | INTRAVENOUS | Status: AC
  Administered 2024-01-06: 2 g via INTRAVENOUS
  Filled 2024-01-06: qty 100

## 2024-01-06 MED ORDER — FENTANYL CITRATE PF 50 MCG/ML IJ SOSY: 25.0000 ug | PREFILLED_SYRINGE | INTRAMUSCULAR | Status: DC | PRN

## 2024-01-06 MED ORDER — EPHEDRINE SULFATE-NACL 50-0.9 MG/10ML-% IV SOSY
PREFILLED_SYRINGE | INTRAVENOUS | Status: DC | PRN
  Administered 2024-01-06: 10 mg via INTRAVENOUS
  Administered 2024-01-06 (×2): 5 mg via INTRAVENOUS

## 2024-01-06 MED ORDER — ACETAMINOPHEN 500 MG PO TABS
1000.0000 mg | ORAL_TABLET | ORAL | Status: AC
  Administered 2024-01-06: 1000 mg via ORAL
  Filled 2024-01-06: qty 2

## 2024-01-06 MED ORDER — PROPOFOL 10 MG/ML IV BOLUS
INTRAVENOUS | Status: DC | PRN
  Administered 2024-01-06: 30 mg via INTRAVENOUS

## 2024-01-06 MED ORDER — CHLORHEXIDINE GLUCONATE CLOTH 2 % EX PADS
6.0000 | MEDICATED_PAD | Freq: Once | CUTANEOUS | Status: DC
Start: 2024-01-06 — End: 2024-01-06

## 2024-01-06 MED ORDER — STERILE WATER FOR IRRIGATION IR SOLN
Status: DC | PRN
  Administered 2024-01-06: 1000 mL

## 2024-01-06 MED ORDER — ONDANSETRON HCL 4 MG/2ML IJ SOLN
INTRAMUSCULAR | Status: DC | PRN
  Administered 2024-01-06: 4 mg via INTRAVENOUS

## 2024-01-06 MED ORDER — 0.9 % SODIUM CHLORIDE (POUR BTL) OPTIME
TOPICAL | Status: DC | PRN
  Administered 2024-01-06: 1000 mL

## 2024-01-06 MED ORDER — FENTANYL CITRATE (PF) 100 MCG/2ML IJ SOLN
INTRAMUSCULAR | Status: AC
  Filled 2024-01-06: qty 2

## 2024-01-06 MED ORDER — TRAMADOL HCL 50 MG PO TABS
50.0000 mg | ORAL_TABLET | Freq: Three times a day (TID) | ORAL | 0 refills | Status: AC | PRN
  Filled 2024-01-06: qty 6, 2d supply, fill #0

## 2024-01-06 MED ORDER — FENTANYL CITRATE (PF) 100 MCG/2ML IJ SOLN
INTRAMUSCULAR | Status: DC | PRN
Start: 2024-01-06 — End: 2024-01-06
  Administered 2024-01-06: 50 ug via INTRAVENOUS

## 2024-01-06 MED ORDER — DEXAMETHASONE SODIUM PHOSPHATE 10 MG/ML IJ SOLN
INTRAMUSCULAR | Status: DC | PRN
  Administered 2024-01-06: 8 mg via INTRAVENOUS

## 2024-01-06 MED ORDER — ORAL CARE MOUTH RINSE: 15.0000 mL | Freq: Once | OROMUCOSAL | Status: AC

## 2024-01-06 MED ORDER — ONDANSETRON HCL 4 MG/2ML IJ SOLN: 4.0000 mg | Freq: Once | INTRAMUSCULAR | Status: DC | PRN

## 2024-01-06 MED ORDER — BUPIVACAINE-EPINEPHRINE 0.25% -1:200000 IJ SOLN
INTRAMUSCULAR | Status: AC
  Filled 2024-01-06: qty 1

## 2024-01-06 MED ORDER — ENSURE PRE-SURGERY PO LIQD: 296.0000 mL | Freq: Once | ORAL | Status: DC

## 2024-01-06 MED ORDER — PROPOFOL 500 MG/50ML IV EMUL
INTRAVENOUS | Status: DC | PRN
  Administered 2024-01-06: 100 ug/kg/min via INTRAVENOUS

## 2024-01-06 MED ORDER — DEXAMETHASONE SODIUM PHOSPHATE 10 MG/ML IJ SOLN
INTRAMUSCULAR | Status: AC
  Filled 2024-01-06: qty 1

## 2024-01-06 MED ORDER — LACTATED RINGERS IV SOLN: INTRAVENOUS | Status: DC

## 2024-01-06 MED ORDER — BUPIVACAINE-EPINEPHRINE 0.25% -1:200000 IJ SOLN
INTRAMUSCULAR | Status: DC | PRN
  Administered 2024-01-06: 20 mL

## 2024-01-06 MED ORDER — CHLORHEXIDINE GLUCONATE CLOTH 2 % EX PADS: 6.0000 | MEDICATED_PAD | Freq: Once | CUTANEOUS | Status: DC

## 2024-01-06 MED ORDER — LIDOCAINE HCL (PF) 2 % IJ SOLN
INTRAMUSCULAR | Status: AC
  Filled 2024-01-06: qty 5

## 2024-01-06 MED ORDER — ONDANSETRON HCL 4 MG/2ML IJ SOLN
INTRAMUSCULAR | Status: AC
  Filled 2024-01-06: qty 2

## 2024-01-06 MED ORDER — PROPOFOL 500 MG/50ML IV EMUL
INTRAVENOUS | Status: AC
  Filled 2024-01-06: qty 50

## 2024-01-06 MED ORDER — CHLORHEXIDINE GLUCONATE 0.12 % MT SOLN
15.0000 mL | Freq: Once | OROMUCOSAL | Status: AC
  Administered 2024-01-06: 15 mL via OROMUCOSAL

## 2024-01-06 SURGICAL SUPPLY — 25 items
BAG COUNTER SPONGE SURGICOUNT (BAG) IMPLANT
BENZOIN TINCTURE PRP APPL 2/3 (GAUZE/BANDAGES/DRESSINGS) IMPLANT
BLADE CLIPPER SURG (BLADE) IMPLANT
CHLORAPREP W/TINT 26 (MISCELLANEOUS) IMPLANT
COVER SURGICAL LIGHT HANDLE (MISCELLANEOUS) ×1 IMPLANT
DERMABOND ADVANCED .7 DNX6 (GAUZE/BANDAGES/DRESSINGS) IMPLANT
DRAPE LAPAROTOMY T 98X78 PEDS (DRAPES) ×1 IMPLANT
DRSG TEGADERM 4X4.75 (GAUZE/BANDAGES/DRESSINGS) IMPLANT
ELECT REM PT RETURN 15FT ADLT (MISCELLANEOUS) ×1 IMPLANT
GAUZE SPONGE 4X4 12PLY STRL (GAUZE/BANDAGES/DRESSINGS) IMPLANT
GLOVE BIOGEL PI IND STRL 7.5 (GLOVE) ×1 IMPLANT
GLOVE SURG SS PI 7.0 STRL IVOR (GLOVE) ×1 IMPLANT
GOWN STRL REUS W/ TWL LRG LVL3 (GOWN DISPOSABLE) ×1 IMPLANT
GOWN STRL REUS W/ TWL XL LVL3 (GOWN DISPOSABLE) IMPLANT
KIT BASIN OR (CUSTOM PROCEDURE TRAY) ×1 IMPLANT
KIT TURNOVER KIT A (KITS) IMPLANT
NDL HYPO 25X1 1.5 SAFETY (NEEDLE) ×1 IMPLANT
NEEDLE HYPO 25X1 1.5 SAFETY (NEEDLE) ×1
PACK GENERAL/GYN (CUSTOM PROCEDURE TRAY) ×1 IMPLANT
STRIP CLOSURE SKIN 1/2X4 (GAUZE/BANDAGES/DRESSINGS) IMPLANT
SUT MNCRL AB 4-0 PS2 18 (SUTURE) ×1 IMPLANT
SUT VIC AB 2-0 SH 27X BRD (SUTURE) ×1 IMPLANT
SUT VICRYL 3-0 CR8 SH (SUTURE) ×1 IMPLANT
SYR CONTROL 10ML LL (SYRINGE) ×1 IMPLANT
TOWEL OR 17X26 10 PK STRL BLUE (TOWEL DISPOSABLE) ×1 IMPLANT

## 2024-01-06 NOTE — Op Note (Signed)
Preoperative diagnosis: metastatic colon cancer  Postoperative diagnosis: same   Procedure: wide local excision of left abdominal wall metastatic colon cancer  Surgeon: Feliciana Rossetti, M.D.  Asst: none  Anesthesia: MAC  Indications for procedure: Kathy Robinson is a 83 y.o. year old female with recurrence of colon cancer as a satellite lesion on the left abdominal wall. She presents for excision.  Description of procedure: The patient was brought into the operative suite. Anesthesia was administered with Monitored Local Anesthesia with Sedation. WHO checklist was applied. The patient was then placed in lateral position. The area was prepped and draped in the usual sterile fashion.  An elliptical incision was made 1 cm from the edge of discoloration. Cautery was used to dissect the subcutaneous tissue creating at least a 1 cm margin from the desmoplastic changes. Suture was used to mark the posterior edge of the skin. Cautery was used for hemostasis. Deep space was closed with interrupted 3-0 vicryl. Deep dermal layer was closed with interrupted 3-0 vicryl. Skin was closed with 4-0 monocryl in running fashion. Dermabond was put in place for dressing.  Findings: good margin from discoloration and desmoplastic changes. No tract going into the abdominal wall  Specimen: left abdominal wall mass, suture marks posterior  Implant: none   Blood loss: 5 ml  Local anesthesia:  20 ml marcaine   Complications: none  Feliciana Rossetti, M.D. General, Bariatric, & Minimally Invasive Surgery [facility]

## 2024-01-06 NOTE — Anesthesia Procedure Notes (Signed)
Date/Time: 01/06/2024 1:00 PM  Performed by: Maybree Riling D, CRNAOxygen Delivery Method: Simple face mask

## 2024-01-06 NOTE — H&P (Signed)
  Chief Complaint  Patient presents with  New Consultation  discuss surgical removal of L flank skin lesion   Subjective  Kathy Robinson is a 83 y.o. female patient in today for: New Consultation (discuss surgical removal of L flank skin lesion)  She had a colon cancer surgery in 2021. She underwent chemotherapy. Her dermatologist find a lesion on her left flank and it has been consistent with metastatic colon cancer.  Social Drivers of Health with Concerns   Tobacco Use: Medium Risk (12/25/2023)  Patient History  Smoking Tobacco Use: Former  Smokeless Tobacco Use: Never  Housing Stability: Unknown (12/25/2023)  Housing Stability Vital Sign  Homeless in the Last Year: No    No data to display      Outpatient Medications Prior to Visit  Medication Sig Dispense Refill  rosuvastatin (CRESTOR) 5 MG tablet Take 5 mg by mouth once daily   No facility-administered medications prior to visit.   Review of Systems  Constitutional: Negative.  HENT: Negative.  Eyes: Negative.  Respiratory: Negative.  Cardiovascular: Negative.  Gastrointestinal: Negative.  Genitourinary: Negative.  Musculoskeletal: Negative.  Skin: Negative.  Neurological: Negative.  Endo/Heme/Allergies: Negative.  Psychiatric/Behavioral: Negative.    Objective   Vitals:  12/25/23 1019  BP: (!) 153/81  Pulse: 86  Temp: 36.7 C (98 F)  SpO2: 98%  Weight: 72.1 kg (159 lb)  Height: 167.6 cm (5\' 6" )   Body mass index is 25.66 kg/m.  Physical Exam Constitutional:  Appearance: Normal appearance.  HENT:  Head: Normocephalic and atraumatic.  Pulmonary:  Effort: Pulmonary effort is normal.  Musculoskeletal:  General: Normal range of motion.  Cervical back: Normal range of motion.  Skin: Comments: 1 cm red lesion left flank, mobile  Neurological:  General: No focal deficit present.  Mental Status: She is alert and oriented to person, place, and time. Mental status is at baseline.  Psychiatric:   Mood and Affect: Mood normal.  Behavior: Behavior normal.  Thought Content: Thought content normal.     I reviewed Kathy Robinson' notes, I reviewed pathology showing metastatic colon cancer   Assessment/Plan:   Diagnoses and all orders for this visit:  Colon cancer metastasized to skin (CMS/HHS-HCC)  The pathophysiology of skin & subcutaneous masses was discussed. Natural history risks without surgery were discussed. I recommended surgery to remove the mass. I explained the technique of removal with use of local anesthesia & possible need for more aggressive sedation/anesthesia for patient comfort.   Risks such as bleeding, infection, wound breakdown, heart attack, death, and other risks were discussed. I noted a good likelihood this will help address the problem. Possibility that this will not correct all symptoms was explained. Possibility of regrowth/recurrence of the mass was discussed. We will work to minimize complications. Questions were answered. The patient expresses understanding & wishes to proceed with surgery.

## 2024-01-06 NOTE — Anesthesia Postprocedure Evaluation (Signed)
Anesthesia Post Note  Patient: Kathy Robinson  Procedure(s) Performed: EXCISION OF METASTATIC COLON CANCER LEFT ABDOMINAL WALL (Left)     Patient location during evaluation: PACU Anesthesia Type: MAC Level of consciousness: awake and alert Pain management: pain level controlled Vital Signs Assessment: post-procedure vital signs reviewed and stable Respiratory status: spontaneous breathing, nonlabored ventilation and respiratory function stable Cardiovascular status: stable and blood pressure returned to baseline Postop Assessment: no apparent nausea or vomiting Anesthetic complications: no   No notable events documented.  Last Vitals:  Vitals:   01/06/24 1337 01/06/24 1345  BP: (!) 131/55 119/61  Pulse: 87 79  Resp: 13 17  Temp: 36.4 C   SpO2: 100% 97%    Last Pain:  Vitals:   01/06/24 1345  TempSrc:   PainSc: 0-No pain                 Collene Schlichter

## 2024-01-06 NOTE — Transfer of Care (Signed)
Immediate Anesthesia Transfer of Care Note  Patient: Kathy Robinson  Procedure(s) Performed: EXCISION OF METASTATIC COLON CANCER LEFT ABDOMINAL WALL (Left)  Patient Location: PACU  Anesthesia Type:MAC  Level of Consciousness: awake, alert , and oriented  Airway & Oxygen Therapy: Patient Spontanous Breathing and Patient connected to face mask oxygen  Post-op Assessment: Report given to RN and Post -op Vital signs reviewed and stable  Post vital signs: Reviewed and stable  Last Vitals:  Vitals Value Taken Time  BP 131/55 01/06/24 1337  Temp    Pulse 86 01/06/24 1338  Resp 13 01/06/24 1338  SpO2 100 % 01/06/24 1338  Vitals shown include unfiled device data.  Last Pain:  Vitals:   01/06/24 1205  TempSrc:   PainSc: 0-No pain         Complications: No notable events documented.

## 2024-01-06 NOTE — Anesthesia Preprocedure Evaluation (Addendum)
Anesthesia Evaluation  Patient identified by MRN, date of birth, ID band Patient awake    Reviewed: Allergy & Precautions, NPO status , Patient's Chart, lab work & pertinent test results  History of Anesthesia Complications Negative for: history of anesthetic complications  Airway Mallampati: II  TM Distance: <3 FB Neck ROM: Full    Dental  (+) Teeth Intact, Dental Advisory Given, Chipped,    Pulmonary former smoker   Pulmonary exam normal breath sounds clear to auscultation       Cardiovascular hypertension, Normal cardiovascular exam+ dysrhythmias Atrial Fibrillation  Rhythm:Regular Rate:Normal     Neuro/Psych  Neuromuscular disease  negative psych ROS   GI/Hepatic Neg liver ROS,,,METASTATIC COLON CANCER   Endo/Other  negative endocrine ROS    Renal/GU negative Renal ROS     Musculoskeletal negative musculoskeletal ROS (+)    Abdominal   Peds  Hematology negative hematology ROS (+)   Anesthesia Other Findings Day of surgery medications reviewed with the patient.  Reproductive/Obstetrics                             Anesthesia Physical Anesthesia Plan  ASA: 3  Anesthesia Plan: MAC   Post-op Pain Management: Tylenol PO (pre-op)*   Induction: Intravenous  PONV Risk Score and Plan: 2 and TIVA and Treatment may vary due to age or medical condition  Airway Management Planned: Natural Airway and Simple Face Mask  Additional Equipment:   Intra-op Plan:   Post-operative Plan:   Informed Consent: I have reviewed the patients History and Physical, chart, labs and discussed the procedure including the risks, benefits and alternatives for the proposed anesthesia with the patient or authorized representative who has indicated his/her understanding and acceptance.     Dental advisory given  Plan Discussed with: CRNA and Anesthesiologist  Anesthesia Plan Comments:          Anesthesia Quick Evaluation

## 2024-01-07 ENCOUNTER — Encounter (HOSPITAL_COMMUNITY): Payer: Self-pay | Admitting: General Surgery

## 2024-01-08 LAB — SURGICAL PATHOLOGY

## 2024-01-27 ENCOUNTER — Inpatient Hospital Stay: Payer: Medicare Other | Admitting: Oncology

## 2024-01-27 ENCOUNTER — Inpatient Hospital Stay: Payer: Medicare Other | Attending: Oncology

## 2024-01-27 ENCOUNTER — Inpatient Hospital Stay: Payer: Medicare Other

## 2024-01-27 VITALS — BP 132/70 | HR 88 | Temp 98.1°F | Resp 18 | Ht 66.0 in | Wt 158.0 lb

## 2024-01-27 DIAGNOSIS — I4891 Unspecified atrial fibrillation: Secondary | ICD-10-CM | POA: Diagnosis not present

## 2024-01-27 DIAGNOSIS — E876 Hypokalemia: Secondary | ICD-10-CM | POA: Diagnosis not present

## 2024-01-27 DIAGNOSIS — C185 Malignant neoplasm of splenic flexure: Secondary | ICD-10-CM | POA: Diagnosis not present

## 2024-01-27 DIAGNOSIS — R911 Solitary pulmonary nodule: Secondary | ICD-10-CM | POA: Diagnosis not present

## 2024-01-27 DIAGNOSIS — C787 Secondary malignant neoplasm of liver and intrahepatic bile duct: Secondary | ICD-10-CM | POA: Insufficient documentation

## 2024-01-27 DIAGNOSIS — C186 Malignant neoplasm of descending colon: Secondary | ICD-10-CM | POA: Insufficient documentation

## 2024-01-27 DIAGNOSIS — R197 Diarrhea, unspecified: Secondary | ICD-10-CM | POA: Insufficient documentation

## 2024-01-27 DIAGNOSIS — Z95828 Presence of other vascular implants and grafts: Secondary | ICD-10-CM

## 2024-01-27 LAB — CEA (ACCESS): CEA (CHCC): <1 ng/mL (ref 0.00–5.00)

## 2024-01-27 MED ORDER — HEPARIN SOD (PORK) LOCK FLUSH 100 UNIT/ML IV SOLN
500.0000 [IU] | Freq: Once | INTRAVENOUS | Status: AC
  Administered 2024-01-27: 500 [IU] via INTRAVENOUS

## 2024-01-27 MED ORDER — SODIUM CHLORIDE 0.9% FLUSH
10.0000 mL | INTRAVENOUS | Status: DC | PRN
  Administered 2024-01-27: 10 mL via INTRAVENOUS

## 2024-01-27 NOTE — Progress Notes (Signed)
 Kathy Robinson OFFICE PROGRESS NOTE   Diagnosis: Colon cancer  INTERVAL HISTORY:   Kathy Robinson returns as scheduled.  She underwent wide excision of the left abdominal wall mass on 01/06/2024.  She reports tolerated procedure well.  She feels well at present.  Objective:  Vital signs in last 24 hours:  Blood pressure 132/70, pulse 88, temperature 98.1 F (36.7 C), temperature source Temporal, resp. rate 18, height 5\' 6"  (1.676 m), weight 158 lb (71.7 kg), last menstrual period 11/19/1996, SpO2 100%.    Lymphatics: No cervical, supraclavicular, axillary, or inguinal nodes Resp: Lungs clear bilaterally Cardio: Regular rate and rhythm GI: No hepatosplenomegaly, nontender, no mass Vascular: No leg edema  Skin: Healed left upper lateral abdominal/chest wall incision with mild surrounding induration.  No mass.  Portacath/PICC-without erythema  Lab Results:  Lab Results  Component Value Date   WBC 6.0 12/30/2023   HGB 12.9 12/30/2023   HCT 40.8 12/30/2023   MCV 103.8 (H) 12/30/2023   PLT 228 12/30/2023   NEUTROABS 2.4 06/25/2023    CMP  Lab Results  Component Value Date   NA 138 12/30/2023   K 4.4 12/30/2023   CL 105 12/30/2023   CO2 24 12/30/2023   GLUCOSE 97 12/30/2023   BUN 19 12/30/2023   CREATININE 0.89 12/30/2023   CALCIUM 9.0 12/30/2023   PROT 6.6 06/25/2023   ALBUMIN 3.9 06/25/2023   AST 24 06/25/2023   ALT 16 06/25/2023   ALKPHOS 52 06/25/2023   BILITOT 0.4 06/25/2023   GFRNONAA >60 12/30/2023   GFRAA >60 08/16/2020    Lab Results  Component Value Date   CEA1 1.16 03/29/2021   CEA <1.00 12/09/2023     Medications: I have reviewed the patient's current medications.   Assessment/Plan: Adenocarcinoma the left colon, stage IIIc (pT4b,pN2a), status post a left colectomy 05/30/2020, MSS, TMB 13, BRAF V600E Tumor invades the visceral peritoneum, lymphovascular and perineural invasion present, for tumor deposits, 7/13 lymph nodes,  negative resection margins, no loss of mismatch repair protein expression CT abdomen/pelvis 05/26/2020-long segment of masslike thickening of the descending colon with associated high-grade colonic obstruction, small colonic wall defect with evidence of a focally contained microperforation, retroperitoneal adenopathy, indeterminate small hypodense liver lesions Elevated preoperative CEA PET 06/22/2020-hypermetabolic liver metastases, periportal and periaortic adenopathy, hypermetabolic mediastinal and left supraclavicular nodes Cycle 1 FOLFOX 07/05/20 Cycle 2 FOLFOXIRI (dose-reduced 5FU, no bolus) and Avastin 07/19/20  Cycle 3 FOLFOXIRI (Dose reduced 5-FU, no bolus)/Avastin 08/02/2020 Cycle 4 FOLFOXIRI/Avastin 08/16/2020 (Udenyca added) Cycle 5 FOLFOXIRI/Avastin 08/30/2020 CTs 09/09/2020-diminished size of lymph nodes in the chest, abdomen, and pelvis.  Decreased hepatic metastases.  No new evidence of disease progression, fat density lesion surrounding the left hemicolectomy Cycle 6 FOLFOXIRI/Avastin 09/13/2020 Cycle 7 FOLFOX/Avastin 09/27/2020 (irinotecan held) Cycle 8 FOLFOXIRI/Avastin 10/18/2020  Cycle 9 FOLFOXIRI/Avastin 11/01/2020 Cycle 10 FOLFOXIRI/Avastin 11/22/2020 (oxaliplatin held, irinotecan and 5-FU dose reduced) Cycle 11 FOLFOXIRI/Avastin 12/12/2020 (oxaliplatin held) CTs 12/19/2020-decrease in size of liver metastases, no evidence of disease progression Cycle 12 FOLFOXIRI/Avastin 01/04/2021 (oxaliplatin held) Cycle 13 FOLFOXIRI/Avastin 01/24/2021 (oxaliplatin held) Cycle 14 FOLFOXIRI/Avastin 02/14/2021 (oxaliplatin held) Cycle 15 FOLFOXIRI/Avastin 03/07/2021 (oxaliplatin held) Cycle 16 FOLFOXIRI/Avastin 03/29/2021 (oxaliplatin held) CTs 04/14/2021- decreased size of liver metastases.  No new or progressive findings. Cycle 17 FOLFOXIRI/Avastin 04/19/2021 (oxaliplatin held) Cycle 18 FOLFOXIRI/Avastin 05/09/2021 (oxaliplatin held) Cycle 19 FOLFOXIRI/Avastin 05/30/2021 (oxaliplatin held) Cycle 20  FOLFOXIRI/Avastin 06/20/2021 (oxaliplatin held) Cycle 21 FOLFOXIRI/Avastin 07/11/2021 (oxaliplatin held) CTs 07/28/2021-unchanged subcentimeter liver lesions, no evidence of new metastatic disease Cycle 22 5-FU/  Avastin 08/01/2021 Cycle 23 5-FU/Avastin 08/22/2021 Cycle 24 5-FU/Avastin 09/12/2021 Cycle 25 5-FU/Avastin 10/03/2021 Cycle 26 5-FU/Avastin 10/24/2021 Cycle 27 5-FU/Avastin 11/15/2021 Cycle 28 5-FU/Avastin 12/05/2021 CTs 12/22/2021-grossly similar subcentimeter low-attenuation lesions in the liver compatible with treated metastasis.  No evidence for new metastatic disease in the chest, abdomen or pelvis. Cycle 29 5-FU/Avastin 12/26/2021 Cycle 30 5-FU/Avastin 01/16/2022 Cycle 41 5-FU/Avastin 02/06/2022 Cycle 42 5-FU/Avastin 02/27/2022 Cycle 43 5-FU/Avastin 03/20/2022 Cycle 44 5-FU/Avastin 04/10/2022 CT 04/28/2022-interval development of a 3 mm nodule within the left lower lobe and 3 mm nodule within the right lower lobe, nonspecific.  Unchanged subcentimeter low-attenuation lesions in the liver compatible with treated metastasis. Cycle 45 5-FU/Avastin 05/01/2022 Cycle 46 5-FU/Avastin 05/29/2022 Cycle 47 5-FU/Avastin 06/19/2022 Cycle 48 5-FU/Avastin 07/10/2022 Cycle 49 5-FU/Avastin 07/31/2022 Cycle 50 5-FU/Avastin 08/21/2022 CTs 09/07/2022-no evidence of metastatic disease, tiny lung nodules described as new on previous exam have resolved, no hepatic metastases are appreciated Cycle 51 5-FU/Avastin 09/11/2022 Cycle 52 5-FU/Avastin 10/02/2022 Cycle 53 5-FU/Avastin 10/23/2022 Cycle 54 5-FU/Avastin 11/20/2021 Cycle 55 5-FU/Avastin 12/11/2022 Cycle 56 5-FU/Avastin 01/08/2023 Cycle 57 5-FU/Avastin 02/05/2023 CTs 02/28/2023-no evidence of recurrent or metastatic disease, stable left paracolic gutter interstitial thickening Cycle 58 5-FU/Avastin 03/05/2023 Cycle 59 5-FU/Avastin 04/02/2023 Cycle 60 5-FU/Avastin 04/30/2023 Cycle 61 5-FU/Avastin 05/28/2023 CT 06/18/2023-no evidence of metastatic disease Treatment break  starting 06/25/2023 CTs 12/09/2023: No evidence of recurrent disease, stable minimal residual fascial thickening of the right paracolic gutter and mild nodular fascial thickening at the left paracolic gutter, (left flank lesion visible and increased compared to a CT 06/18/2023 ( Biopsy of left flank skin lesion 12/04/2023: Metastatic adenocarcinoma, CDX2 and CK20 positive Excision of left flank skin lesion 01/06/2024-metastatic adenocarcinoma compatible with colorectal carcinoma, extensive lymphovascular invasion, negative resection margins     Atrial fibrillation with rapid ventricular response 05/26/2020 Mild neutropenia following cycle 3 FOLFOXIRI, Udenyca added with cycle 4 Hypokalemia secondary to diarrhea-potassium supplementation starting 09/13/2020 Oxaliplatin neuropathy-moderate loss of vibratory sense on exam 11/22/2020, oxaliplatin held with cycle 10, cycle 11, 12, 13 chemotherapy, improved      Disposition: Kathy Robinson has a history of metastatic colon cancer dating to July 2021.  She underwent excision of a left abdomen/flank skin lesion on 01/06/2024.  The pathology confirmed metastatic colon cancer and the resection margins are negative.  She is currently without evidence of disease.  She will be followed with observation.  Kathy Robinson will return for an office visit and Port-A-Cath flush in approximately 8 weeks. We will resume systemic therapy if she develops further evidence of disease progression.  Thornton Papas, MD  01/27/2024  11:49 AM

## 2024-01-27 NOTE — Patient Instructions (Signed)

## 2024-01-28 ENCOUNTER — Other Ambulatory Visit: Payer: Self-pay

## 2024-01-28 DIAGNOSIS — H524 Presbyopia: Secondary | ICD-10-CM | POA: Diagnosis not present

## 2024-02-06 ENCOUNTER — Encounter: Payer: Self-pay | Admitting: Internal Medicine

## 2024-02-06 ENCOUNTER — Ambulatory Visit (INDEPENDENT_AMBULATORY_CARE_PROVIDER_SITE_OTHER): Payer: Medicare Other | Admitting: Internal Medicine

## 2024-02-06 ENCOUNTER — Other Ambulatory Visit (HOSPITAL_COMMUNITY): Payer: Self-pay

## 2024-02-06 VITALS — BP 122/70 | HR 88 | Temp 98.3°F | Ht 66.0 in | Wt 156.7 lb

## 2024-02-06 DIAGNOSIS — C186 Malignant neoplasm of descending colon: Secondary | ICD-10-CM | POA: Diagnosis not present

## 2024-02-06 DIAGNOSIS — I4891 Unspecified atrial fibrillation: Secondary | ICD-10-CM | POA: Diagnosis not present

## 2024-02-06 DIAGNOSIS — E782 Mixed hyperlipidemia: Secondary | ICD-10-CM

## 2024-02-06 MED ORDER — ROSUVASTATIN CALCIUM 5 MG PO TABS
5.0000 mg | ORAL_TABLET | Freq: Every day | ORAL | 1 refills | Status: DC
  Filled 2024-02-06 – 2024-04-07 (×2): qty 90, 90d supply, fill #0
  Filled 2024-07-13: qty 90, 90d supply, fill #1

## 2024-02-06 NOTE — Progress Notes (Signed)
 New Patient Office Visit     CC/Reason for Visit: Establish care, discuss chronic conditions Previous PCP: Mila Palmer, MD Last Visit: Unknown  HPI: Kathy Robinson is a 83 y.o. female who is coming in today for the above mentioned reasons. Past Medical History is significant for: Stage IV colon cancer followed by Dr. Myrle Sheng, neuropathy due to chemotherapy, hyperlipidemia on rosuvastatin.  She recently had by dermatology a wide excision of her abdominal wall due to a lesion that ended up being colon cancer.  There has been no disease activity elsewhere.  She does not smoke or drink, no allergies other than some intolerances to codeine, past surgical history is significant for tonsillectomy in her youth and a colectomy.  She is overdue for Tdap and PCV 20.   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 Arlyce Dice   EXCISION OF ABDOMINAL WALL TUMOR Left 01/06/2024   Procedure: EXCISION OF METASTATIC COLON CANCER LEFT ABDOMINAL WALL;  Surgeon: Sheliah Hatch, De Blanch, MD;  Location: [facility];  Service: General;  Laterality: Left;   EYE SURGERY Bilateral    cataract extraction   PORTACATH PLACEMENT Right 06/20/2020   Procedure: INSERTION PORT-A-CATH WITH ULTRASOUND;  Surgeon: Sheliah Hatch De Blanch, MD;  Location: [facility];  Service: General;  Laterality: Right;   TONSILLECTOMY      Social  History:  reports that she has quit smoking. Her smoking use included cigarettes. She has a 1.3 pack-year smoking history. She has never used smokeless tobacco. She reports that she does not currently use alcohol after a past usage of about 3.0 standard drinks of alcohol per week. She reports that she does not use drugs.  Allergies: Allergies  Allergen Reactions   Codeine Nausea Only   Tape     Can use paper tape   Allevyn Adhesive [Wound Dressings] Rash   Neosporin [Neomycin-Bacitracin Zn-Polymyx] Swelling and Rash    Family History:  Family History  Problem Relation Age of Onset   Osteoporosis Mother    Cancer Mother    Cancer Maternal Grandmother        colon   AAA (abdominal aortic aneurysm) Father      Current Outpatient Medications:    acetaminophen (TYLENOL) 500 MG tablet, Take 2 tablets (1,000 mg total) by mouth every 6 (six) hours., Disp: 30 tablet, Rfl: 0   Calcium Carbonate (CALCIUM 500 PO), Take 500 mg by mouth daily., Disp: , Rfl:    magnesium oxide (MAG-OX) 400 (241.3 Mg) MG tablet, Take 1 tablet (400 mg total) by mouth 2 (two) times daily. (Patient taking differently: Take 400 mg by mouth daily.), Disp: 60 tablet, Rfl: 1   Polyethylene Glycol 3350 (MIRALAX PO), Take 17 g by mouth daily., Disp: , Rfl:    triamcinolone cream (KENALOG) 0.1 %, Apply to affected areas  2 (two) times daily., Disp: 454 g, Rfl: 0   rosuvastatin (CRESTOR) 5 MG tablet, Take 1 tablet (5 mg total) by mouth daily., Disp: 90 tablet, Rfl: 1  Review of Systems:  Negative except as indicated in HPI.   Physical Exam: Vitals:   02/06/24 1302  BP: 122/70  Pulse: 88  Temp: 98.3 F (36.8 C)  TempSrc: Oral  SpO2: 93%  Weight: 156 lb 11.2 oz (71.1 kg)  Height: 5\' 6"  (1.676 m)   Body mass index is 25.29 kg/m.  Physical Exam Vitals reviewed.  Constitutional:      Appearance: Normal appearance.  HENT:     Head: Normocephalic and atraumatic.  Eyes:     Conjunctiva/sclera: Conjunctivae  normal.  Cardiovascular:     Rate and Rhythm: Normal rate and regular rhythm.  Pulmonary:     Effort: Pulmonary effort is normal.     Breath sounds: Normal breath sounds.  Skin:    General: Skin is warm and dry.  Neurological:     General: No focal deficit present.     Mental Status: She is alert and oriented to person, place, and time.  Psychiatric:        Mood and Affect: Mood normal.        Behavior: Behavior normal.        Thought Content: Thought content normal.        Judgment: Judgment normal.     Flowsheet Row Office Visit from 02/06/2024 in [facility] [place]  PHQ-9 Total Score 0        Impression and Plan:  Cancer of left colon [facility])  Atrial fibrillation with RVR (HCC)  Mixed hyperlipidemia -     Rosuvastatin Calcium; Take 1 tablet (5 mg total) by mouth daily.  Dispense: 90 tablet; Refill: 1    -Refill rosuvastatin. -She prefers to obtain Tdap and PCV 20 at her local pharmacy. -She follows with oncology in regards to colon cancer as scheduled.  Time spent: 46 minutes reviewing chart, interviewing and examining patient and formulating plan of care     Jourdin Gens Philip Aspen, MD [facility]

## 2024-02-07 ENCOUNTER — Other Ambulatory Visit (HOSPITAL_COMMUNITY): Payer: Self-pay

## 2024-02-11 ENCOUNTER — Other Ambulatory Visit (HOSPITAL_COMMUNITY): Payer: Self-pay

## 2024-03-23 ENCOUNTER — Other Ambulatory Visit (HOSPITAL_BASED_OUTPATIENT_CLINIC_OR_DEPARTMENT_OTHER): Payer: Self-pay

## 2024-03-23 ENCOUNTER — Inpatient Hospital Stay

## 2024-03-23 ENCOUNTER — Inpatient Hospital Stay: Attending: Oncology

## 2024-03-23 ENCOUNTER — Telehealth: Payer: Self-pay

## 2024-03-23 ENCOUNTER — Telehealth: Payer: Self-pay | Admitting: Oncology

## 2024-03-23 ENCOUNTER — Inpatient Hospital Stay: Admitting: Oncology

## 2024-03-23 VITALS — BP 133/66 | HR 82 | Temp 98.1°F | Resp 18 | Ht 66.0 in | Wt 159.0 lb

## 2024-03-23 DIAGNOSIS — R197 Diarrhea, unspecified: Secondary | ICD-10-CM | POA: Diagnosis not present

## 2024-03-23 DIAGNOSIS — G629 Polyneuropathy, unspecified: Secondary | ICD-10-CM | POA: Insufficient documentation

## 2024-03-23 DIAGNOSIS — D709 Neutropenia, unspecified: Secondary | ICD-10-CM | POA: Diagnosis not present

## 2024-03-23 DIAGNOSIS — R599 Enlarged lymph nodes, unspecified: Secondary | ICD-10-CM | POA: Insufficient documentation

## 2024-03-23 DIAGNOSIS — Z9221 Personal history of antineoplastic chemotherapy: Secondary | ICD-10-CM | POA: Insufficient documentation

## 2024-03-23 DIAGNOSIS — C185 Malignant neoplasm of splenic flexure: Secondary | ICD-10-CM

## 2024-03-23 DIAGNOSIS — C186 Malignant neoplasm of descending colon: Secondary | ICD-10-CM | POA: Insufficient documentation

## 2024-03-23 DIAGNOSIS — I4891 Unspecified atrial fibrillation: Secondary | ICD-10-CM | POA: Insufficient documentation

## 2024-03-23 DIAGNOSIS — E876 Hypokalemia: Secondary | ICD-10-CM | POA: Insufficient documentation

## 2024-03-23 DIAGNOSIS — C787 Secondary malignant neoplasm of liver and intrahepatic bile duct: Secondary | ICD-10-CM | POA: Insufficient documentation

## 2024-03-23 LAB — CEA (ACCESS): CEA (CHCC): <1 ng/mL (ref 0.00–5.00)

## 2024-03-23 MED ORDER — CAPVAXIVE 0.5 ML IM SOSY
PREFILLED_SYRINGE | INTRAMUSCULAR | 0 refills | Status: DC
  Filled 2024-03-23: qty 0.5, 1d supply, fill #0

## 2024-03-23 NOTE — Progress Notes (Signed)
  [facility] OFFICE PROGRESS NOTE   Diagnosis: Colon cancer  INTERVAL HISTORY:   Kathy Robinson returns as scheduled.  She feels well.  Good appetite.  Stable neuropathy symptoms in the extremities.  Objective:  Vital signs in last 24 hours:  Blood pressure 133/66, pulse 82, temperature 98.1 F (36.7 C), temperature source Temporal, resp. rate 18, height 5\' 6"  (1.676 m), weight 159 lb (72.1 kg), last menstrual period 11/19/1996, SpO2 100%.    Lymphatics: No cervical, supraclavicular, axillary, or inguinal nodes Resp: Lungs clear bilaterally Cardio: Regular rate and rhythm GI: No hepatosplenomegaly Vascular: No leg edema  Skin: Mild tenderness surrounding the left lateral chest wall scar, no evidence of recurrent tumor  Portacath/PICC-without erythema  Lab Results:  Lab Results  Component Value Date   WBC 6.0 12/30/2023   HGB 12.9 12/30/2023   HCT 40.8 12/30/2023   MCV 103.8 (H) 12/30/2023   PLT 228 12/30/2023   NEUTROABS 2.4 06/25/2023    CMP  Lab Results  Component Value Date   NA 138 12/30/2023   K 4.4 12/30/2023   CL 105 12/30/2023   CO2 24 12/30/2023   GLUCOSE 97 12/30/2023   BUN 19 12/30/2023   CREATININE 0.89 12/30/2023   CALCIUM  9.0 12/30/2023   PROT 6.6 06/25/2023   ALBUMIN 3.9 06/25/2023   AST 24 06/25/2023   ALT 16 06/25/2023   ALKPHOS 52 06/25/2023   BILITOT 0.4 06/25/2023   GFRNONAA >60 12/30/2023   GFRAA >60 08/16/2020    Lab Results  Component Value Date   CEA1 1.16 03/29/2021   CEA <1.00 01/27/2024    Lab Results  Component Value Date   INR 1.1 05/30/2020   LABPROT 13.5 05/30/2020    Imaging:  No results found.  Medications: I have reviewed the patient's current medications.   Assessment/Plan: Adenocarcinoma the left colon, stage IIIc (pT4b,pN2a), status post a left colectomy 05/30/2020, MSS, TMB 13, BRAF V600E Tumor invades the visceral peritoneum, lymphovascular and perineural invasion present, for tumor  deposits, 7/13 lymph nodes, negative resection margins, no loss of mismatch repair protein expression CT abdomen/pelvis 05/26/2020-long segment of masslike thickening of the descending colon with associated high-grade colonic obstruction, small colonic wall defect with evidence of a focally contained microperforation, retroperitoneal adenopathy, indeterminate small hypodense liver lesions Elevated preoperative CEA PET 06/22/2020-hypermetabolic liver metastases, periportal and periaortic adenopathy, hypermetabolic mediastinal and left supraclavicular nodes Cycle 1 FOLFOX 07/05/20 Cycle 2 FOLFOXIRI (dose-reduced 5FU, no bolus) and Avastin  07/19/20  Cycle 3 FOLFOXIRI (Dose reduced 5-FU, no bolus)/Avastin  08/02/2020 Cycle 4 FOLFOXIRI/Avastin  08/16/2020 (Udenyca  added) Cycle 5 FOLFOXIRI/Avastin  08/30/2020 CTs 09/09/2020-diminished size of lymph nodes in the chest, abdomen, and pelvis.  Decreased hepatic metastases.  No new evidence of disease progression, fat density lesion surrounding the left hemicolectomy Cycle 6 FOLFOXIRI/Avastin  09/13/2020 Cycle 7 FOLFOX/Avastin  09/27/2020 (irinotecan  held) Cycle 8 FOLFOXIRI/Avastin  10/18/2020  Cycle 9 FOLFOXIRI/Avastin  11/01/2020 Cycle 10 FOLFOXIRI/Avastin  11/22/2020 (oxaliplatin  held, irinotecan  and 5-FU dose reduced) Cycle 11 FOLFOXIRI/Avastin  12/12/2020 (oxaliplatin  held) CTs 12/19/2020-decrease in size of liver metastases, no evidence of disease progression Cycle 12 FOLFOXIRI/Avastin  01/04/2021 (oxaliplatin  held) Cycle 13 FOLFOXIRI/Avastin  01/24/2021 (oxaliplatin  held) Cycle 14 FOLFOXIRI/Avastin  02/14/2021 (oxaliplatin  held) Cycle 15 FOLFOXIRI/Avastin  03/07/2021 (oxaliplatin  held) Cycle 16 FOLFOXIRI/Avastin  03/29/2021 (oxaliplatin  held) CTs 04/14/2021- decreased size of liver metastases.  No new or progressive findings. Cycle 17 FOLFOXIRI/Avastin  04/19/2021 (oxaliplatin  held) Cycle 18 FOLFOXIRI/Avastin  05/09/2021 (oxaliplatin  held) Cycle 19 FOLFOXIRI/Avastin  05/30/2021  (oxaliplatin  held) Cycle 20 FOLFOXIRI/Avastin  06/20/2021 (oxaliplatin  held) Cycle 21 FOLFOXIRI/Avastin  07/11/2021 (oxaliplatin  held) CTs 07/28/2021-unchanged subcentimeter liver  lesions, no evidence of new metastatic disease Cycle 22 5-FU/ Avastin  08/01/2021 Cycle 23 5-FU/Avastin  08/22/2021 Cycle 24 5-FU/Avastin  09/12/2021 Cycle 25 5-FU/Avastin  10/03/2021 Cycle 26 5-FU/Avastin  10/24/2021 Cycle 27 5-FU/Avastin  11/15/2021 Cycle 28 5-FU/Avastin  12/05/2021 CTs 12/22/2021-grossly similar subcentimeter low-attenuation lesions in the liver compatible with treated metastasis.  No evidence for new metastatic disease in the chest, abdomen or pelvis. Cycle 29 5-FU/Avastin  12/26/2021 Cycle 30 5-FU/Avastin  01/16/2022 Cycle 41 5-FU/Avastin  02/06/2022 Cycle 42 5-FU/Avastin  02/27/2022 Cycle 43 5-FU/Avastin  03/20/2022 Cycle 44 5-FU/Avastin  04/10/2022 CT 04/28/2022-interval development of a 3 mm nodule within the left lower lobe and 3 mm nodule within the right lower lobe, nonspecific.  Unchanged subcentimeter low-attenuation lesions in the liver compatible with treated metastasis. Cycle 45 5-FU/Avastin  05/01/2022 Cycle 46 5-FU/Avastin  05/29/2022 Cycle 47 5-FU/Avastin  06/19/2022 Cycle 48 5-FU/Avastin  07/10/2022 Cycle 49 5-FU/Avastin  07/31/2022 Cycle 50 5-FU/Avastin  08/21/2022 CTs 09/07/2022-no evidence of metastatic disease, tiny lung nodules described as new on previous exam have resolved, no hepatic metastases are appreciated Cycle 51 5-FU/Avastin  09/11/2022 Cycle 52 5-FU/Avastin  10/02/2022 Cycle 53 5-FU/Avastin  10/23/2022 Cycle 54 5-FU/Avastin  11/20/2021 Cycle 55 5-FU/Avastin  12/11/2022 Cycle 56 5-FU/Avastin  01/08/2023 Cycle 57 5-FU/Avastin  02/05/2023 CTs 02/28/2023-no evidence of recurrent or metastatic disease, stable left paracolic gutter interstitial thickening Cycle 58 5-FU/Avastin  03/05/2023 Cycle 59 5-FU/Avastin  04/02/2023 Cycle 60 5-FU/Avastin  04/30/2023 Cycle 61 5-FU/Avastin  05/28/2023 CT 06/18/2023-no evidence of  metastatic disease Treatment break starting 06/25/2023 CTs 12/09/2023: No evidence of recurrent disease, stable minimal residual fascial thickening of the right paracolic gutter and mild nodular fascial thickening at the left paracolic gutter, (left flank lesion visible and increased compared to a CT 06/18/2023 ( Biopsy of left flank skin lesion 12/04/2023: Metastatic adenocarcinoma, CDX2 and CK20 positive Excision of left flank skin lesion 01/06/2024-metastatic adenocarcinoma compatible with colorectal carcinoma, extensive lymphovascular invasion, negative resection margins     Atrial fibrillation with rapid ventricular response 05/26/2020 Mild neutropenia following cycle 3 FOLFOXIRI, Udenyca  added with cycle 4 Hypokalemia secondary to diarrhea-potassium supplementation starting 09/13/2020 Oxaliplatin  neuropathy-moderate loss of vibratory sense on exam 11/22/2020, oxaliplatin  held with cycle 10, cycle 11, 12, 13 chemotherapy, improved       Disposition: Kathy Robinson appears stable.  No clinical evidence for progression of metastatic colon cancer.  We will follow-up on the CEA from today.  She will be scheduled for surveillance CTs in late July.  She will return for a Port-A-Cath flush in 6 weeks.  She will be scheduled for an office visit after the surveillance imaging.  She is due for a mammogram and bone density scan.  I ordered these today.  Coni Deep, MD  03/23/2024  9:38 AM

## 2024-03-23 NOTE — Telephone Encounter (Signed)
 Patient has been scheduled for port flush per 5/5//25 LOS.   Additional appts need to be scheduled to coordinate with scan.    Follow-Up Information  Follow-up disposition: Return for Lab, Scan, office.  Check out comments: Portacath flush week of 6/16 Portacath labs and then CTs same day week of 7/14  Office 1 week after CTs

## 2024-03-23 NOTE — Telephone Encounter (Signed)
-----   Message from Coni Deep sent at 03/23/2024  1:35 PM EDT ----- Please call patient, the CEA is normal, follow-up as scheduled

## 2024-03-23 NOTE — Telephone Encounter (Signed)
 Patient gave verbal understanding and had no further questions or concerns

## 2024-03-24 ENCOUNTER — Other Ambulatory Visit: Payer: Self-pay

## 2024-03-27 ENCOUNTER — Telehealth: Payer: Self-pay | Admitting: *Deleted

## 2024-03-27 NOTE — Telephone Encounter (Signed)
 Faxed bone density and mammogram orders to [facility] [phone] and Ms. Mosko was made aware.

## 2024-03-31 ENCOUNTER — Telehealth: Payer: Self-pay | Admitting: *Deleted

## 2024-03-31 DIAGNOSIS — M8588 Other specified disorders of bone density and structure, other site: Secondary | ICD-10-CM | POA: Diagnosis not present

## 2024-03-31 DIAGNOSIS — Z8262 Family history of osteoporosis: Secondary | ICD-10-CM | POA: Diagnosis not present

## 2024-03-31 DIAGNOSIS — Z1231 Encounter for screening mammogram for malignant neoplasm of breast: Secondary | ICD-10-CM | POA: Diagnosis not present

## 2024-03-31 LAB — HM DEXA SCAN

## 2024-03-31 LAB — HM MAMMOGRAPHY

## 2024-03-31 NOTE — Telephone Encounter (Signed)
 Notified Ms. Naccarato of her lab/flush/CT scan appointments on 5/17 and OV on 7/22 for scan review.

## 2024-04-01 ENCOUNTER — Encounter: Payer: Self-pay | Admitting: Internal Medicine

## 2024-04-01 ENCOUNTER — Other Ambulatory Visit: Payer: Self-pay

## 2024-04-02 ENCOUNTER — Encounter: Payer: Self-pay | Admitting: Internal Medicine

## 2024-04-07 ENCOUNTER — Other Ambulatory Visit (HOSPITAL_COMMUNITY): Payer: Self-pay

## 2024-04-12 ENCOUNTER — Other Ambulatory Visit: Payer: Self-pay

## 2024-05-04 ENCOUNTER — Inpatient Hospital Stay: Attending: Oncology

## 2024-05-04 DIAGNOSIS — C186 Malignant neoplasm of descending colon: Secondary | ICD-10-CM | POA: Insufficient documentation

## 2024-05-04 DIAGNOSIS — Z452 Encounter for adjustment and management of vascular access device: Secondary | ICD-10-CM | POA: Insufficient documentation

## 2024-05-04 DIAGNOSIS — Z95828 Presence of other vascular implants and grafts: Secondary | ICD-10-CM

## 2024-05-04 MED ORDER — SODIUM CHLORIDE 0.9% FLUSH
10.0000 mL | Freq: Once | INTRAVENOUS | Status: AC
  Administered 2024-05-04: 10 mL

## 2024-05-04 MED ORDER — HEPARIN SOD (PORK) LOCK FLUSH 100 UNIT/ML IV SOLN
500.0000 [IU] | Freq: Once | INTRAVENOUS | Status: AC
  Administered 2024-05-04: 500 [IU]

## 2024-05-25 ENCOUNTER — Ambulatory Visit: Admitting: Oncology

## 2024-06-04 ENCOUNTER — Inpatient Hospital Stay

## 2024-06-04 ENCOUNTER — Ambulatory Visit (HOSPITAL_BASED_OUTPATIENT_CLINIC_OR_DEPARTMENT_OTHER)
Admission: RE | Admit: 2024-06-04 | Discharge: 2024-06-04 | Disposition: A | Discharge status: Home / Self Care | Source: Ambulatory Visit | Attending: Oncology | Admitting: Oncology

## 2024-06-04 ENCOUNTER — Inpatient Hospital Stay: Attending: Oncology

## 2024-06-04 DIAGNOSIS — E876 Hypokalemia: Secondary | ICD-10-CM | POA: Insufficient documentation

## 2024-06-04 DIAGNOSIS — R197 Diarrhea, unspecified: Secondary | ICD-10-CM | POA: Diagnosis not present

## 2024-06-04 DIAGNOSIS — C185 Malignant neoplasm of splenic flexure: Secondary | ICD-10-CM | POA: Insufficient documentation

## 2024-06-04 DIAGNOSIS — C189 Malignant neoplasm of colon, unspecified: Secondary | ICD-10-CM | POA: Diagnosis not present

## 2024-06-04 DIAGNOSIS — I4891 Unspecified atrial fibrillation: Secondary | ICD-10-CM | POA: Diagnosis not present

## 2024-06-04 DIAGNOSIS — K5900 Constipation, unspecified: Secondary | ICD-10-CM | POA: Diagnosis not present

## 2024-06-04 DIAGNOSIS — G629 Polyneuropathy, unspecified: Secondary | ICD-10-CM | POA: Diagnosis not present

## 2024-06-04 DIAGNOSIS — I700 Atherosclerosis of aorta: Secondary | ICD-10-CM | POA: Diagnosis not present

## 2024-06-04 DIAGNOSIS — Z85038 Personal history of other malignant neoplasm of large intestine: Secondary | ICD-10-CM | POA: Insufficient documentation

## 2024-06-04 DIAGNOSIS — Z9221 Personal history of antineoplastic chemotherapy: Secondary | ICD-10-CM | POA: Diagnosis not present

## 2024-06-04 LAB — CEA (ACCESS): CEA (CHCC): <1 ng/mL (ref 0.00–5.00)

## 2024-06-04 LAB — BASIC METABOLIC PANEL - CANCER CENTER ONLY
Anion gap: 9 (ref 5–15)
BUN: 24 mg/dL — ABNORMAL HIGH (ref 8–23)
CO2: 24 mmol/L (ref 22–32)
Calcium: 9 mg/dL (ref 8.9–10.3)
Chloride: 104 mmol/L (ref 98–111)
Creatinine: 0.9 mg/dL (ref 0.44–1.00)
GFR, Estimated: >60 mL/min (ref >60)
Glucose, Bld: 94 mg/dL (ref 70–99)
Potassium: 4.2 mmol/L (ref 3.5–5.1)
Sodium: 138 mmol/L (ref 135–145)

## 2024-06-04 MED ORDER — HEPARIN SOD (PORK) LOCK FLUSH 100 UNIT/ML IV SOLN
500.0000 [IU] | Freq: Once | INTRAVENOUS | Status: AC
  Administered 2024-06-04: 500 [IU] via INTRAVENOUS

## 2024-06-04 MED ORDER — IOHEXOL 300 MG/ML  SOLN
100.0000 mL | Freq: Once | INTRAMUSCULAR | Status: AC | PRN
  Administered 2024-06-04: 100 mL via INTRAVENOUS

## 2024-06-09 ENCOUNTER — Inpatient Hospital Stay: Admitting: Oncology

## 2024-06-09 VITALS — BP 134/68 | HR 91 | Temp 98.7°F | Resp 18 | Ht 66.0 in | Wt 160.0 lb

## 2024-06-09 DIAGNOSIS — C185 Malignant neoplasm of splenic flexure: Secondary | ICD-10-CM

## 2024-06-09 DIAGNOSIS — E876 Hypokalemia: Secondary | ICD-10-CM | POA: Diagnosis not present

## 2024-06-09 DIAGNOSIS — R197 Diarrhea, unspecified: Secondary | ICD-10-CM | POA: Diagnosis not present

## 2024-06-09 DIAGNOSIS — Z9221 Personal history of antineoplastic chemotherapy: Secondary | ICD-10-CM | POA: Diagnosis not present

## 2024-06-09 DIAGNOSIS — G629 Polyneuropathy, unspecified: Secondary | ICD-10-CM | POA: Diagnosis not present

## 2024-06-09 DIAGNOSIS — I4891 Unspecified atrial fibrillation: Secondary | ICD-10-CM | POA: Diagnosis not present

## 2024-06-09 DIAGNOSIS — Z85038 Personal history of other malignant neoplasm of large intestine: Secondary | ICD-10-CM | POA: Diagnosis not present

## 2024-06-09 NOTE — Progress Notes (Signed)
 [facility] OFFICE PROGRESS NOTE   Diagnosis: Colon cancer  INTERVAL HISTORY:   K[facility] returns as scheduled.  She feels well.  She continues to have neuropathy symptoms in the hands and feet.  She reports tenderness at the left chest wall scar.  No new complaint.  Objective:  Vital signs in last 24 hours:  Blood pressure 134/68, pulse 91, temperature 98.7 F (37.1 C), temperature source Temporal, resp. rate 18, height 5' 6 (1.676 m), weight 160 lb (72.6 kg), last menstrual period 11/19/1996, SpO2 98%.    Lymphatics: No cervical, supraclavicular, axillary, or inguinal nodes Resp: Lungs clear bilaterally Cardio: Regular rate and rhythm GI: No hepatosplenomegaly Vascular: No leg edema  Skin: Left lateral chest wall scar without evidence of recurrent tumor, tenderness surrounding the scar  Portacath/PICC-without erythema  Lab Results:  Lab Results  Component Value Date   WBC 6.0 12/30/2023   HGB 12.9 12/30/2023   HCT 40.8 12/30/2023   MCV 103.8 (H) 12/30/2023   PLT 228 12/30/2023   NEUTROABS 2.4 06/25/2023    CMP  Lab Results  Component Value Date   NA 138 06/04/2024   K 4.2 06/04/2024   CL 104 06/04/2024   CO2 24 06/04/2024   GLUCOSE 94 06/04/2024   BUN 24 (H) 06/04/2024   CREATININE 0.90 06/04/2024   CALCIUM  9.0 06/04/2024   PROT 6.6 06/25/2023   ALBUMIN 3.9 06/25/2023   AST 24 06/25/2023   ALT 16 06/25/2023   ALKPHOS 52 06/25/2023   BILITOT 0.4 06/25/2023   GFRNONAA >60 06/04/2024   GFRAA >60 08/16/2020    Lab Results  Component Value Date   CEA1 1.16 03/29/2021   CEA <1.00 06/04/2024    Medications: I have reviewed the patient's current medications.   Assessment/Plan: Adenocarcinoma the left colon, stage IIIc (pT4b,pN2a), status post a left colectomy 05/30/2020, MSS, TMB 13, BRAF V600E Tumor invades the visceral peritoneum, lymphovascular and perineural invasion present, for tumor deposits, 7/13 lymph nodes, negative  resection margins, no loss of mismatch repair protein expression CT abdomen/pelvis 05/26/2020-long segment of masslike thickening of the descending colon with associated high-grade colonic obstruction, small colonic wall defect with evidence of a focally contained microperforation, retroperitoneal adenopathy, indeterminate small hypodense liver lesions Elevated preoperative CEA PET 06/22/2020-hypermetabolic liver metastases, periportal and periaortic adenopathy, hypermetabolic mediastinal and left supraclavicular nodes Cycle 1 FOLFOX 07/05/20 Cycle 2 FOLFOXIRI (dose-reduced 5FU, no bolus) and Avastin  07/19/20  Cycle 3 FOLFOXIRI (Dose reduced 5-FU, no bolus)/Avastin  08/02/2020 Cycle 4 FOLFOXIRI/Avastin  08/16/2020 (Udenyca  added) Cycle 5 FOLFOXIRI/Avastin  08/30/2020 CTs 09/09/2020-diminished size of lymph nodes in the chest, abdomen, and pelvis.  Decreased hepatic metastases.  No new evidence of disease progression, fat density lesion surrounding the left hemicolectomy Cycle 6 FOLFOXIRI/Avastin  09/13/2020 Cycle 7 FOLFOX/Avastin  09/27/2020 (irinotecan  held) Cycle 8 FOLFOXIRI/Avastin  10/18/2020  Cycle 9 FOLFOXIRI/Avastin  11/01/2020 Cycle 10 FOLFOXIRI/Avastin  11/22/2020 (oxaliplatin  held, irinotecan  and 5-FU dose reduced) Cycle 11 FOLFOXIRI/Avastin  12/12/2020 (oxaliplatin  held) CTs 12/19/2020-decrease in size of liver metastases, no evidence of disease progression Cycle 12 FOLFOXIRI/Avastin  01/04/2021 (oxaliplatin  held) Cycle 13 FOLFOXIRI/Avastin  01/24/2021 (oxaliplatin  held) Cycle 14 FOLFOXIRI/Avastin  02/14/2021 (oxaliplatin  held) Cycle 15 FOLFOXIRI/Avastin  03/07/2021 (oxaliplatin  held) Cycle 16 FOLFOXIRI/Avastin  03/29/2021 (oxaliplatin  held) CTs 04/14/2021- decreased size of liver metastases.  No new or progressive findings. Cycle 17 FOLFOXIRI/Avastin  04/19/2021 (oxaliplatin  held) Cycle 18 FOLFOXIRI/Avastin  05/09/2021 (oxaliplatin  held) Cycle 19 FOLFOXIRI/Avastin  05/30/2021 (oxaliplatin  held) Cycle 20  FOLFOXIRI/Avastin  06/20/2021 (oxaliplatin  held) Cycle 21 FOLFOXIRI/Avastin  07/11/2021 (oxaliplatin  held) CTs 07/28/2021-unchanged subcentimeter liver lesions, no evidence of new metastatic disease Cycle  22 5-FU/ Avastin  08/01/2021 Cycle 23 5-FU/Avastin  08/22/2021 Cycle 24 5-FU/Avastin  09/12/2021 Cycle 25 5-FU/Avastin  10/03/2021 Cycle 26 5-FU/Avastin  10/24/2021 Cycle 27 5-FU/Avastin  11/15/2021 Cycle 28 5-FU/Avastin  12/05/2021 CTs 12/22/2021-grossly similar subcentimeter low-attenuation lesions in the liver compatible with treated metastasis.  No evidence for new metastatic disease in the chest, abdomen or pelvis. Cycle 29 5-FU/Avastin  12/26/2021 Cycle 30 5-FU/Avastin  01/16/2022 Cycle 41 5-FU/Avastin  02/06/2022 Cycle 42 5-FU/Avastin  02/27/2022 Cycle 43 5-FU/Avastin  03/20/2022 Cycle 44 5-FU/Avastin  04/10/2022 CT 04/28/2022-interval development of a 3 mm nodule within the left lower lobe and 3 mm nodule within the right lower lobe, nonspecific.  Unchanged subcentimeter low-attenuation lesions in the liver compatible with treated metastasis. Cycle 45 5-FU/Avastin  05/01/2022 Cycle 46 5-FU/Avastin  05/29/2022 Cycle 47 5-FU/Avastin  06/19/2022 Cycle 48 5-FU/Avastin  07/10/2022 Cycle 49 5-FU/Avastin  07/31/2022 Cycle 50 5-FU/Avastin  08/21/2022 CTs 09/07/2022-no evidence of metastatic disease, tiny lung nodules described as new on previous exam have resolved, no hepatic metastases are appreciated Cycle 51 5-FU/Avastin  09/11/2022 Cycle 52 5-FU/Avastin  10/02/2022 Cycle 53 5-FU/Avastin  10/23/2022 Cycle 54 5-FU/Avastin  11/20/2021 Cycle 55 5-FU/Avastin  12/11/2022 Cycle 56 5-FU/Avastin  01/08/2023 Cycle 57 5-FU/Avastin  02/05/2023 CTs 02/28/2023-no evidence of recurrent or metastatic disease, stable left paracolic gutter interstitial thickening Cycle 58 5-FU/Avastin  03/05/2023 Cycle 59 5-FU/Avastin  04/02/2023 Cycle 60 5-FU/Avastin  04/30/2023 Cycle 61 5-FU/Avastin  05/28/2023 CT 06/18/2023-no evidence of metastatic disease Treatment break  starting 06/25/2023 CTs 12/09/2023: No evidence of recurrent disease, stable minimal residual fascial thickening of the right paracolic gutter and mild nodular fascial thickening at the left paracolic gutter, (left flank lesion visible and increased compared to a CT 06/18/2023 ( Biopsy of left flank skin lesion 12/04/2023: Metastatic adenocarcinoma, CDX2 and CK20 positive Excision of left flank skin lesion 01/06/2024-metastatic adenocarcinoma compatible with colorectal carcinoma, extensive lymphovascular invasion, negative resection margins CTs 06/04/2024: No evidence of recurrent or metastatic disease     Atrial fibrillation with rapid ventricular response 05/26/2020 Mild neutropenia following cycle 3 FOLFOXIRI, Udenyca  added with cycle 4 Hypokalemia secondary to diarrhea-potassium supplementation starting 09/13/2020 Oxaliplatin  neuropathy-moderate loss of vibratory sense on exam 11/22/2020, oxaliplatin  held with cycle 10, cycle 11, 12, 13 chemotherapy, improved      Disposition: Kathy Robinson is in clinical remission from colon cancer.  The CEA is normal and the restaging CTs reveal no evidence of recurrent disease.  The plan is to continue observation.  She will return for a Port-A-Cath flush in 6 weeks.  She will be scheduled for an office visit, Port-A-Cath flush, and CEA in 3 months.  Arley Hof, MD  06/09/2024  12:19 PM

## 2024-07-06 DIAGNOSIS — D229 Melanocytic nevi, unspecified: Secondary | ICD-10-CM | POA: Diagnosis not present

## 2024-07-06 DIAGNOSIS — L821 Other seborrheic keratosis: Secondary | ICD-10-CM | POA: Diagnosis not present

## 2024-07-06 DIAGNOSIS — L814 Other melanin hyperpigmentation: Secondary | ICD-10-CM | POA: Diagnosis not present

## 2024-07-06 DIAGNOSIS — L570 Actinic keratosis: Secondary | ICD-10-CM | POA: Diagnosis not present

## 2024-07-06 DIAGNOSIS — L578 Other skin changes due to chronic exposure to nonionizing radiation: Secondary | ICD-10-CM | POA: Diagnosis not present

## 2024-07-13 DIAGNOSIS — K080 Exfoliation of teeth due to systemic causes: Secondary | ICD-10-CM | POA: Diagnosis not present

## 2024-07-15 ENCOUNTER — Other Ambulatory Visit (HOSPITAL_COMMUNITY): Payer: Self-pay

## 2024-07-21 ENCOUNTER — Inpatient Hospital Stay: Attending: Oncology

## 2024-07-21 NOTE — Patient Instructions (Signed)

## 2024-09-01 ENCOUNTER — Inpatient Hospital Stay: Admitting: Oncology

## 2024-09-01 ENCOUNTER — Inpatient Hospital Stay: Attending: Oncology

## 2024-09-01 ENCOUNTER — Inpatient Hospital Stay

## 2024-09-01 VITALS — BP 126/81 | HR 80 | Temp 97.7°F | Resp 18 | Ht 66.0 in | Wt 158.0 lb

## 2024-09-01 DIAGNOSIS — D709 Neutropenia, unspecified: Secondary | ICD-10-CM | POA: Diagnosis not present

## 2024-09-01 DIAGNOSIS — I4891 Unspecified atrial fibrillation: Secondary | ICD-10-CM | POA: Insufficient documentation

## 2024-09-01 DIAGNOSIS — G629 Polyneuropathy, unspecified: Secondary | ICD-10-CM | POA: Insufficient documentation

## 2024-09-01 DIAGNOSIS — E876 Hypokalemia: Secondary | ICD-10-CM | POA: Diagnosis not present

## 2024-09-01 DIAGNOSIS — R197 Diarrhea, unspecified: Secondary | ICD-10-CM | POA: Diagnosis not present

## 2024-09-01 DIAGNOSIS — C185 Malignant neoplasm of splenic flexure: Secondary | ICD-10-CM

## 2024-09-01 DIAGNOSIS — Z9221 Personal history of antineoplastic chemotherapy: Secondary | ICD-10-CM | POA: Insufficient documentation

## 2024-09-01 DIAGNOSIS — Z85038 Personal history of other malignant neoplasm of large intestine: Secondary | ICD-10-CM | POA: Insufficient documentation

## 2024-09-01 LAB — CEA (ACCESS): CEA (CHCC): <1 ng/mL (ref 0.00–5.00)

## 2024-09-01 NOTE — Progress Notes (Signed)
 [facility]   Diagnosis:: Colon cancer  INTERVAL HISTORY:   Kathy Robinson returns as scheduled.  She feels well.  No change in neuropathy symptoms.  She has occasional discomfort at the left chest wall surgical site.  Good appetite.  Objective:  Vital signs in last 24 hours:  Blood pressure 126/81, pulse 80, temperature 97.7 F (36.5 C), temperature source Temporal, resp. rate 18, height 5' 6 (1.676 m), weight 158 lb (71.7 kg), last menstrual period 11/19/1996, SpO2 100%.   Lymphatics: No cervical, supraclavicular, axillary, or inguinal nodes Resp: Lungs clear bilaterally Cardio: Regular rate and rhythm GI: No hepatosplenomegaly Vascular: No leg edema  Skin: No evidence for recurrent tumor at the left posterolateral chest wall surgical site  Portacath/PICC-without erythema  Lab Results:  Lab Results  Component Value Date   WBC 6.0 12/30/2023   HGB 12.9 12/30/2023   HCT 40.8 12/30/2023   MCV 103.8 (H) 12/30/2023   PLT 228 12/30/2023   NEUTROABS 2.4 06/25/2023    CMP  Lab Results  Component Value Date   NA 138 06/04/2024   K 4.2 06/04/2024   CL 104 06/04/2024   CO2 24 06/04/2024   GLUCOSE 94 06/04/2024   BUN 24 (H) 06/04/2024   CREATININE 0.90 06/04/2024   CALCIUM  9.0 06/04/2024   PROT 6.6 06/25/2023   ALBUMIN 3.9 06/25/2023   AST 24 06/25/2023   ALT 16 06/25/2023   ALKPHOS 52 06/25/2023   BILITOT 0.4 06/25/2023   GFRNONAA >60 06/04/2024   GFRAA >60 08/16/2020    Lab Results  Component Value Date   CEA1 1.16 03/29/2021   CEA <1.00 06/04/2024    Medications: I have reviewed the patient's current medications.   Assessment/Plan: Adenocarcinoma the left colon, stage IIIc (pT4b,pN2a), status post a left colectomy 05/30/2020, MSS, TMB 13, BRAF V600E Tumor invades the visceral peritoneum, lymphovascular and perineural invasion present, for tumor deposits, 7/13 lymph nodes, negative resection margins, no loss of mismatch  repair protein expression CT abdomen/pelvis 05/26/2020-long segment of masslike thickening of the descending colon with associated high-grade colonic obstruction, small colonic wall defect with evidence of a focally contained microperforation, retroperitoneal adenopathy, indeterminate small hypodense liver lesions Elevated preoperative CEA PET 06/22/2020-hypermetabolic liver metastases, periportal and periaortic adenopathy, hypermetabolic mediastinal and left supraclavicular nodes Cycle 1 FOLFOX 07/05/20 Cycle 2 FOLFOXIRI (dose-reduced 5FU, no bolus) and Avastin  07/19/20  Cycle 3 FOLFOXIRI (Dose reduced 5-FU, no bolus)/Avastin  08/02/2020 Cycle 4 FOLFOXIRI/Avastin  08/16/2020 (Udenyca  added) Cycle 5 FOLFOXIRI/Avastin  08/30/2020 CTs 09/09/2020-diminished size of lymph nodes in the chest, abdomen, and pelvis.  Decreased hepatic metastases.  No new evidence of disease progression, fat density lesion surrounding the left hemicolectomy Cycle 6 FOLFOXIRI/Avastin  09/13/2020 Cycle 7 FOLFOX/Avastin  09/27/2020 (irinotecan  held) Cycle 8 FOLFOXIRI/Avastin  10/18/2020  Cycle 9 FOLFOXIRI/Avastin  11/01/2020 Cycle 10 FOLFOXIRI/Avastin  11/22/2020 (oxaliplatin  held, irinotecan  and 5-FU dose reduced) Cycle 11 FOLFOXIRI/Avastin  12/12/2020 (oxaliplatin  held) CTs 12/19/2020-decrease in size of liver metastases, no evidence of disease progression Cycle 12 FOLFOXIRI/Avastin  01/04/2021 (oxaliplatin  held) Cycle 13 FOLFOXIRI/Avastin  01/24/2021 (oxaliplatin  held) Cycle 14 FOLFOXIRI/Avastin  02/14/2021 (oxaliplatin  held) Cycle 15 FOLFOXIRI/Avastin  03/07/2021 (oxaliplatin  held) Cycle 16 FOLFOXIRI/Avastin  03/29/2021 (oxaliplatin  held) CTs 04/14/2021- decreased size of liver metastases.  No new or progressive findings. Cycle 17 FOLFOXIRI/Avastin  04/19/2021 (oxaliplatin  held) Cycle 18 FOLFOXIRI/Avastin  05/09/2021 (oxaliplatin  held) Cycle 19 FOLFOXIRI/Avastin  05/30/2021 (oxaliplatin  held) Cycle 20 FOLFOXIRI/Avastin  06/20/2021 (oxaliplatin  held) Cycle  21 FOLFOXIRI/Avastin  07/11/2021 (oxaliplatin  held) CTs 07/28/2021-unchanged subcentimeter liver lesions, no evidence of new metastatic disease Cycle 22 5-FU/ Avastin  08/01/2021 Cycle 23 5-FU/Avastin   08/22/2021 Cycle 24 5-FU/Avastin  09/12/2021 Cycle 25 5-FU/Avastin  10/03/2021 Cycle 26 5-FU/Avastin  10/24/2021 Cycle 27 5-FU/Avastin  11/15/2021 Cycle 28 5-FU/Avastin  12/05/2021 CTs 12/22/2021-grossly similar subcentimeter low-attenuation lesions in the liver compatible with treated metastasis.  No evidence for new metastatic disease in the chest, abdomen or pelvis. Cycle 29 5-FU/Avastin  12/26/2021 Cycle 30 5-FU/Avastin  01/16/2022 Cycle 41 5-FU/Avastin  02/06/2022 Cycle 42 5-FU/Avastin  02/27/2022 Cycle 43 5-FU/Avastin  03/20/2022 Cycle 44 5-FU/Avastin  04/10/2022 CT 04/28/2022-interval development of a 3 mm nodule within the left lower lobe and 3 mm nodule within the right lower lobe, nonspecific.  Unchanged subcentimeter low-attenuation lesions in the liver compatible with treated metastasis. Cycle 45 5-FU/Avastin  05/01/2022 Cycle 46 5-FU/Avastin  05/29/2022 Cycle 47 5-FU/Avastin  06/19/2022 Cycle 48 5-FU/Avastin  07/10/2022 Cycle 49 5-FU/Avastin  07/31/2022 Cycle 50 5-FU/Avastin  08/21/2022 CTs 09/07/2022-no evidence of metastatic disease, tiny lung nodules described as new on previous exam have resolved, no hepatic metastases are appreciated Cycle 51 5-FU/Avastin  09/11/2022 Cycle 52 5-FU/Avastin  10/02/2022 Cycle 53 5-FU/Avastin  10/23/2022 Cycle 54 5-FU/Avastin  11/20/2021 Cycle 55 5-FU/Avastin  12/11/2022 Cycle 56 5-FU/Avastin  01/08/2023 Cycle 57 5-FU/Avastin  02/05/2023 CTs 02/28/2023-no evidence of recurrent or metastatic disease, stable left paracolic gutter interstitial thickening Cycle 58 5-FU/Avastin  03/05/2023 Cycle 59 5-FU/Avastin  04/02/2023 Cycle 60 5-FU/Avastin  04/30/2023 Cycle 61 5-FU/Avastin  05/28/2023 CT 06/18/2023-no evidence of metastatic disease Treatment break starting 06/25/2023 CTs 12/09/2023: No evidence of  recurrent disease, stable minimal residual fascial thickening of the right paracolic gutter and mild nodular fascial thickening at the left paracolic gutter, (left flank lesion visible and increased compared to a CT 06/18/2023 ( Biopsy of left flank skin lesion 12/04/2023: Metastatic adenocarcinoma, CDX2 and CK20 positive Excision of left flank skin lesion 01/06/2024-metastatic adenocarcinoma compatible with colorectal carcinoma, extensive lymphovascular invasion, negative resection margins CTs 06/04/2024: No evidence of recurrent or metastatic disease     Atrial fibrillation with rapid ventricular response 05/26/2020 Mild neutropenia following cycle 3 FOLFOXIRI, Udenyca  added with cycle 4 Hypokalemia secondary to diarrhea-potassium supplementation starting 09/13/2020 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 Murthy appears stable.  She is in clinical remission from colon cancer.  She will return for a Port-A-Cath flush in 6 weeks.  She will be scheduled for an office visit and restaging CTs in 3 months.  We will follow-up on the CEA from today.  Arley Hof, MD  09/01/2024  10:40 AM

## 2024-09-02 ENCOUNTER — Other Ambulatory Visit: Payer: Self-pay

## 2024-09-07 ENCOUNTER — Encounter: Payer: Self-pay | Admitting: Internal Medicine

## 2024-09-07 ENCOUNTER — Other Ambulatory Visit (HOSPITAL_COMMUNITY): Payer: Self-pay

## 2024-09-07 ENCOUNTER — Ambulatory Visit: Admitting: Internal Medicine

## 2024-09-07 VITALS — BP 120/78 | HR 72 | Temp 98.0°F | Ht 65.5 in | Wt 157.6 lb

## 2024-09-07 DIAGNOSIS — E782 Mixed hyperlipidemia: Secondary | ICD-10-CM | POA: Diagnosis not present

## 2024-09-07 DIAGNOSIS — Z0000 Encounter for general adult medical examination without abnormal findings: Secondary | ICD-10-CM | POA: Diagnosis not present

## 2024-09-07 DIAGNOSIS — C186 Malignant neoplasm of descending colon: Secondary | ICD-10-CM | POA: Diagnosis not present

## 2024-09-07 DIAGNOSIS — I4891 Unspecified atrial fibrillation: Secondary | ICD-10-CM

## 2024-09-07 MED ORDER — ROSUVASTATIN CALCIUM 5 MG PO TABS
5.0000 mg | ORAL_TABLET | Freq: Every day | ORAL | 1 refills | Status: AC
  Filled 2024-09-07 – 2024-10-07 (×3): qty 90, 90d supply, fill #0

## 2024-09-07 NOTE — Progress Notes (Signed)
 Established Patient Office Visit     CC/Reason for Visit: Annual preventive exam and subsequent Medicare wellness visit  HPI: Kathy Robinson is a 83 y.o. female who is coming in today for the above mentioned reasons. Past Medical History is significant for: Cancer of the colon with abdominal wall metastatic disease status post excision, most recently CT scan without evidence of recurrence, followed by Dr. Deanne.  Feeling well without concerns or complaints.  Is due for COVID and flu vaccines.   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 Debrah   EXCISION OF ABDOMINAL WALL TUMOR Left 01/06/2024   Procedure: EXCISION OF METASTATIC COLON CANCER LEFT ABDOMINAL WALL;  Surgeon: Stevie, Herlene Righter, MD;  Location: [facility];  Service: General;  Laterality: Left;   EYE SURGERY Bilateral    cataract extraction   PORTACATH PLACEMENT Right 06/20/2020   Procedure: INSERTION PORT-A-CATH WITH ULTRASOUND;  Surgeon: Stevie Herlene Righter, MD;  Location: [facility];  Service: General;  Laterality: Right;   TONSILLECTOMY      Social History:  reports that she has quit smoking. Her smoking use included cigarettes. She has a 1.3 pack-year smoking history. She has never used smokeless tobacco. She reports that she does not currently use alcohol after a past usage of about 3.0 standard drinks of alcohol per  week. She reports that she does not use drugs.  Allergies: Allergies  Allergen Reactions   Codeine Nausea Only   Tape     Can use paper tape   Allevyn Adhesive [Wound Dressings] Rash   Neosporin [Neomycin-Bacitracin Zn-Polymyx] Swelling and Rash    Family History:  Family History  Problem Relation Age of Onset   Osteoporosis Mother    Cancer Mother    Cancer Maternal Grandmother        colon   AAA (abdominal aortic aneurysm) Father      Current Outpatient Medications:    Calcium  Carbonate (CALCIUM  500 PO), Take 500 mg by mouth daily., Disp: , Rfl:    magnesium  oxide (MAG-OX) 400 (241.3 Mg) MG tablet, Take 1 tablet (400 mg total) by mouth 2 (two) times daily., Disp: 60 tablet, Rfl: 1   Polyethylene Glycol 3350  (MIRALAX  PO), Take 17 g by mouth daily., Disp: , Rfl:    triamcinolone  cream (KENALOG ) 0.1 %, Apply to affected areas 2 (two) times daily., Disp: 454 g, Rfl: 0   rosuvastatin  (CRESTOR ) 5 MG tablet, Take 1 tablet (5 mg total) by mouth daily., Disp: 90 tablet, Rfl: 1  Review of Systems:  Negative unless indicated in HPI.   Physical Exam: Vitals:   09/07/24 1057  BP: 120/78  Pulse: 72  Temp: 98 F (36.7 C)  TempSrc: Oral  SpO2: 99%  Weight: 157 lb 9.6 oz (71.5 kg)  Height: 5' 5.5 (1.664 m)  Body mass index is 25.83 kg/m.   Physical Exam Vitals reviewed.  Constitutional:      General: She is not in acute distress.    Appearance: Normal appearance. She is not ill-appearing, toxic-appearing or diaphoretic.  HENT:     Head: Normocephalic.     Right Ear: Tympanic membrane, ear canal and external ear normal. There is no impacted cerumen.     Left Ear: Tympanic membrane, ear canal and external ear normal. There is no impacted cerumen.     Nose: Nose normal.     Mouth/Throat:     Mouth: Mucous membranes are moist.     Pharynx: Oropharynx is clear. No oropharyngeal exudate or posterior oropharyngeal erythema.  Eyes:     General: No scleral icterus.        Right eye: No discharge.        Left eye: No discharge.     Conjunctiva/sclera: Conjunctivae normal.     Pupils: Pupils are equal, round, and reactive to light.  Neck:     Vascular: No carotid bruit.  Cardiovascular:     Rate and Rhythm: Normal rate and regular rhythm.     Pulses: Normal pulses.     Heart sounds: Normal heart sounds.  Pulmonary:     Effort: Pulmonary effort is normal. No respiratory distress.     Breath sounds: Normal breath sounds.  Abdominal:     General: Abdomen is flat. Bowel sounds are normal.     Palpations: Abdomen is soft.  Musculoskeletal:        General: Normal range of motion.     Cervical back: Normal range of motion.  Skin:    General: Skin is warm and dry.  Neurological:     General: No focal deficit present.     Mental Status: She is alert and oriented to person, place, and time. Mental status is at baseline.  Psychiatric:        Mood and Affect: Mood normal.        Behavior: Behavior normal.        Thought Content: Thought content normal.        Judgment: Judgment normal.    Subsequent Medicare wellness visit   1. Risk factors, based on past  M,S,F - Cardiac Risk Factors include: advanced age (>73men, >19 women);dyslipidemia   2.  Physical activities: Dietary issues and exercise activities discussed:      3.  Depression/mood:  Flowsheet Row Office Visit from 02/06/2024 in [facility] Total Score 0     4.  ADL's:    09/07/2024   10:47 AM 12/30/2023    2:10 PM  In your present state of health, do you have any difficulty performing the following activities:  Hearing? 0   Vision? 0   Difficulty concentrating or making decisions? 0   Walking or climbing stairs? 1   Dressing or bathing? 0   Doing errands, shopping? 0 0  Preparing Food and eating ? N   Using the Toilet? N   In the past six months, have you accidently leaked urine? N   Do you have problems with loss of bowel control? N   Managing  your Medications? N   Managing your Finances? N   Housekeeping or managing your Housekeeping? N      5.  Fall risk:     10/23/2022   11:40 AM 10/25/2022   10:46 AM 11/20/2022   11:09 AM 02/06/2024  1:39 PM 09/07/2024   10:50 AM  Fall Risk  Falls in the past year?    0 0  Was there an injury with Fall?    0 0  Fall Risk Category Calculator    0 0  (RETIRED) Patient Fall Risk Level High fall risk  High fall risk  High fall risk     Fall risk Follow up    Falls evaluation completed Falls evaluation completed     Data saved with a previous flowsheet row definition     6.  Home safety: No problems identified   7.  Height weight, and visual acuity: height and weight as above, vision/hearing: Vision Screening   Right eye Left eye Both eyes  Without correction     With correction 20/30 20/30 20/30      8.  Counseling: Counseling given: Not Answered Tobacco comments: quit age 57's    35. Lab orders based on risk factors: Laboratory update will be reviewed   10. Cognitive assessment:        09/07/2024   10:50 AM  6CIT Screen  What Year? 0 points  What month? 0 points  What time? 0 points  Count back from 20 0 points  Months in reverse 0 points  Repeat phrase 0 points  Total Score 0 points     11. Screening: Patient provided with a written and personalized 5-10 year screening schedule in the AVS. Health Maintenance  Topic Date Due   Zoster (Shingles) Vaccine (1 of 2) [birth year]22/1960   DTaP/Tdap/Td vaccine (3 - Td or Tdap) 06/16/2022   COVID-19 Vaccine (5 - 2025-26 season) 07/20/2024   Medicare Annual Wellness Visit  09/09/2024   Flu Shot  02/16/2025*   Pneumococcal Vaccine for age over 28  Completed   DEXA scan (bone density measurement)  Completed   Meningitis B Vaccine  Aged Out   Breast Cancer Screening  Discontinued  *Topic was postponed. The date shown is not the original due date.    12. Provider List Update: Patient Care Team    Relationship Specialty  Notifications Start End  Theophilus Andrews, Tully GRADE, MD PCP - General Internal Medicine  01/27/24   Francyne Headland, MD PCP - Cardiology Cardiology  06/23/20   Cloretta Arley NOVAK, MD Consulting Physician Oncology Yes. All results, Admissions 06/06/20      13. Advance Directives: Does Patient Have a Medical Advance Directive?: Yes Type of Advance Directive: Healthcare Power of Attorney, Living will, Out of facility DNR (pink MOST or yellow form) Does patient want to make changes to medical advance directive?: No - Patient declined Copy of Healthcare Power of Attorney in Chart?: No - copy requested  14. Opioids: Patient is not on any opioid prescriptions and has no risk factors for a substance use disorder.   15.   Goals      Protect My Health           Why is this important?   Screening tests can find diseases early when they are easier to treat.  Your doctor or nurse will talk with you about which tests are important for you.  Getting shots for common diseases like the flu and shingles will help prevent them.     Notes:          I have personally reviewed and noted the following in the patient's chart:   Medical and social history Use of alcohol, tobacco or illicit drugs  Current medications and supplements Functional ability and status  Nutritional status Physical activity Advanced directives List of other physicians Hospitalizations, surgeries, and ER visits in previous 12 months Vitals Screenings to include cognitive, depression, and falls Referrals and appointments  In addition, I have reviewed and discussed with patient certain preventive protocols, quality metrics, and best practice recommendations. A written personalized care plan for preventive services as well as general preventive health recommendations were provided to patient.   Impression and Plan:  Medicare annual wellness visit, subsequent  Mixed hyperlipidemia -     Rosuvastatin  Calcium ; Take 1 tablet  (5 mg total) by mouth daily.  Dispense: 90 tablet; Refill: 1  Cancer of left colon (HCC) -     CBC with Differential/Platelet; Future -     Comprehensive metabolic panel with GFR; Future -     Lipid panel; Future -     TSH; Future -     Vitamin B12; Future -     VITAMIN D  25 Hydroxy (Vit-D Deficiency, Fractures); Future   -Recommend routine eye and dental care. -Healthy lifestyle discussed in detail. -Labs to be updated today. -Prostate cancer screening: N/A Health Maintenance  Topic Date Due   Zoster (Shingles) Vaccine (1 of 2) 07[birth year]22/1961   DTaP/Tdap/Td vaccine (3 - Td or Tdap) 06/16/2022   COVID-19 Vaccine (5 - 2025-26 season) 07/20/2024   Medicare Annual Wellness Visit  09/09/2024   Flu Shot  02/16/2025*   Pneumococcal Vaccine for age over 43  Completed   DEXA scan (bone density measurement)  Completed   Meningitis B Vaccine  Aged Out   Breast Cancer Screening  Discontinued  *Topic was postponed. The date shown is not the original due date.     - Declines flu vaccine today despite counseling, will consider updating COVID-vaccine as well.   Tully Theophilus Andrews, MD [facility]

## 2024-09-08 ENCOUNTER — Other Ambulatory Visit: Payer: Self-pay

## 2024-09-10 ENCOUNTER — Ambulatory Visit: Payer: Self-pay | Admitting: Oncology

## 2024-09-10 ENCOUNTER — Other Ambulatory Visit (INDEPENDENT_AMBULATORY_CARE_PROVIDER_SITE_OTHER)

## 2024-09-10 DIAGNOSIS — C186 Malignant neoplasm of descending colon: Secondary | ICD-10-CM

## 2024-09-10 LAB — CBC WITH DIFFERENTIAL/PLATELET
Basophils Absolute: 0 K/uL (ref 0.0–0.1)
Basophils Relative: 0.7 % (ref 0.0–3.0)
Eosinophils Absolute: 0.2 K/uL (ref 0.0–0.7)
Eosinophils Relative: 3.6 % (ref 0.0–5.0)
HCT: 42.5 % (ref 36.0–46.0)
Hemoglobin: 13.9 g/dL (ref 12.0–15.0)
Lymphocytes Relative: 32.3 % (ref 12.0–46.0)
Lymphs Abs: 1.6 K/uL (ref 0.7–4.0)
MCHC: 32.8 g/dL (ref 30.0–36.0)
MCV: 100.3 fl — ABNORMAL HIGH (ref 78.0–100.0)
Monocytes Absolute: 0.4 K/uL (ref 0.1–1.0)
Monocytes Relative: 7.5 % (ref 3.0–12.0)
Neutro Abs: 2.8 K/uL (ref 1.4–7.7)
Neutrophils Relative %: 55.9 % (ref 43.0–77.0)
Platelets: 248 K/uL (ref 150.0–400.0)
RBC: 4.24 Mil/uL (ref 3.87–5.11)
RDW: 13.6 % (ref 11.5–15.5)
WBC: 5 K/uL (ref 4.0–10.5)

## 2024-09-10 LAB — COMPREHENSIVE METABOLIC PANEL WITH GFR
ALT: 19 U/L (ref 0–35)
AST: 26 U/L (ref 0–37)
Albumin: 4.2 g/dL (ref 3.5–5.2)
Alkaline Phosphatase: 67 U/L (ref 39–117)
BUN: 19 mg/dL (ref 6–23)
CO2: 28 meq/L (ref 19–32)
Calcium: 9.3 mg/dL (ref 8.4–10.5)
Chloride: 104 meq/L (ref 96–112)
Creatinine, Ser: 0.89 mg/dL (ref 0.40–1.20)
GFR: 60.5 mL/min (ref >60.00)
Glucose, Bld: 90 mg/dL (ref 70–99)
Potassium: 4.6 meq/L (ref 3.5–5.1)
Sodium: 139 meq/L (ref 135–145)
Total Bilirubin: 0.4 mg/dL (ref 0.2–1.2)
Total Protein: 7.2 g/dL (ref 6.0–8.3)

## 2024-09-10 LAB — LIPID PANEL
Cholesterol: 198 mg/dL (ref 0–200)
HDL: 74.7 mg/dL (ref >39.00)
LDL Cholesterol: 109 mg/dL — ABNORMAL HIGH (ref 0–99)
NonHDL: 122.87
Total CHOL/HDL Ratio: 3
Triglycerides: 68 mg/dL (ref 0.0–149.0)
VLDL: 13.6 mg/dL (ref 0.0–40.0)

## 2024-09-10 LAB — VITAMIN B12: Vitamin B-12: 159 pg/mL — ABNORMAL LOW (ref 211–911)

## 2024-09-10 LAB — VITAMIN D 25 HYDROXY (VIT D DEFICIENCY, FRACTURES): VITD: 23.67 ng/mL — ABNORMAL LOW (ref 30.00–100.00)

## 2024-09-10 LAB — TSH: TSH: 4.77 u[IU]/mL (ref 0.35–5.50)

## 2024-09-10 NOTE — Telephone Encounter (Signed)
-----   Message from Arley Hof sent at 09/10/2024  4:33 PM EDT ----- Please call patient, the vitamin B12 level is low, she should start vitamin B12 replacement, start vitamin B12 1000 mcg oral daily, Dr. Theophilus may have already recommended this  ----- Message ----- From: Interface, Lab In Three Zero One Sent: 09/10/2024   3:19 PM EDT To: Arley KATHEE Hof, MD

## 2024-09-10 NOTE — Telephone Encounter (Signed)
 Patient gave verbal understanding and had no further questions.

## 2024-09-17 ENCOUNTER — Other Ambulatory Visit (HOSPITAL_COMMUNITY): Payer: Self-pay

## 2024-09-21 ENCOUNTER — Other Ambulatory Visit (HOSPITAL_BASED_OUTPATIENT_CLINIC_OR_DEPARTMENT_OTHER): Payer: Self-pay

## 2024-09-21 MED ORDER — FLUZONE HIGH-DOSE 0.5 ML IM SUSY
0.5000 mL | PREFILLED_SYRINGE | Freq: Once | INTRAMUSCULAR | 0 refills | Status: AC
  Filled 2024-09-21: qty 0.5, 1d supply, fill #0

## 2024-10-07 ENCOUNTER — Other Ambulatory Visit (HOSPITAL_COMMUNITY): Payer: Self-pay

## 2024-10-13 ENCOUNTER — Inpatient Hospital Stay: Attending: Oncology

## 2024-10-13 DIAGNOSIS — Z452 Encounter for adjustment and management of vascular access device: Secondary | ICD-10-CM | POA: Diagnosis not present

## 2024-10-13 DIAGNOSIS — Z85038 Personal history of other malignant neoplasm of large intestine: Secondary | ICD-10-CM | POA: Diagnosis not present

## 2024-10-13 NOTE — Patient Instructions (Signed)

## 2024-11-06 ENCOUNTER — Telehealth: Payer: Self-pay | Admitting: *Deleted

## 2024-11-06 NOTE — Telephone Encounter (Signed)
 Called Kathy Robinson with scan date/time/prep. She understands and agrees to report to radiology at 0845 to register and get her oral contrast started.

## 2024-12-01 ENCOUNTER — Encounter: Payer: Self-pay | Admitting: Oncology

## 2024-12-01 ENCOUNTER — Encounter (HOSPITAL_BASED_OUTPATIENT_CLINIC_OR_DEPARTMENT_OTHER): Payer: Self-pay

## 2024-12-02 ENCOUNTER — Inpatient Hospital Stay

## 2024-12-02 ENCOUNTER — Inpatient Hospital Stay: Attending: Oncology

## 2024-12-02 ENCOUNTER — Ambulatory Visit (HOSPITAL_BASED_OUTPATIENT_CLINIC_OR_DEPARTMENT_OTHER)
Admission: RE | Admit: 2024-12-02 | Discharge: 2024-12-02 | Disposition: A | Discharge status: Home / Self Care | Source: Ambulatory Visit | Attending: Oncology | Admitting: Oncology

## 2024-12-02 DIAGNOSIS — C185 Malignant neoplasm of splenic flexure: Secondary | ICD-10-CM | POA: Insufficient documentation

## 2024-12-02 LAB — BASIC METABOLIC PANEL - CANCER CENTER ONLY
Anion gap: 8 (ref 5–15)
BUN: 17 mg/dL (ref 8–23)
CO2: 28 mmol/L (ref 22–32)
Calcium: 9.6 mg/dL (ref 8.9–10.3)
Chloride: 104 mmol/L (ref 98–111)
Creatinine: 0.89 mg/dL (ref 0.44–1.00)
GFR, Estimated: >60 mL/min
Glucose, Bld: 95 mg/dL (ref 70–99)
Potassium: 4.1 mmol/L (ref 3.5–5.1)
Sodium: 139 mmol/L (ref 135–145)

## 2024-12-02 LAB — CEA (ACCESS): CEA (CHCC): <1 ng/mL (ref 0.00–5.00)

## 2024-12-02 MED ORDER — IOHEXOL 300 MG/ML  SOLN
100.0000 mL | Freq: Once | INTRAMUSCULAR | Status: AC | PRN
  Administered 2024-12-02: 100 mL via INTRAVENOUS

## 2024-12-02 NOTE — Patient Instructions (Signed)

## 2024-12-03 ENCOUNTER — Telehealth: Payer: Self-pay | Admitting: Oncology

## 2024-12-03 NOTE — Telephone Encounter (Signed)
 PT called to scheduled FU appt, day and time confirmed.

## 2024-12-04 ENCOUNTER — Telehealth: Payer: Self-pay | Admitting: Nurse Practitioner

## 2024-12-04 ENCOUNTER — Ambulatory Visit: Payer: Self-pay | Admitting: Oncology

## 2024-12-04 NOTE — Telephone Encounter (Signed)
 I contacted Kathy Robinson regarding 12/02/2024 CT results.  There is no evidence of recurrent or metastatic cancer.  She will follow-up as scheduled.

## 2024-12-10 ENCOUNTER — Inpatient Hospital Stay (HOSPITAL_BASED_OUTPATIENT_CLINIC_OR_DEPARTMENT_OTHER): Admitting: Oncology

## 2024-12-10 VITALS — BP 134/77 | HR 85 | Temp 98.0°F | Resp 18 | Ht 65.5 in | Wt 159.6 lb

## 2024-12-10 DIAGNOSIS — C185 Malignant neoplasm of splenic flexure: Secondary | ICD-10-CM

## 2024-12-10 NOTE — Progress Notes (Signed)
 " [facility] OFFICE PROGRESS NOTE   Diagnosis: Colon cancer  INTERVAL HISTORY:   Kathy Robinson returns as scheduled.  She feels well.  She reports stable neuropathy symptoms.  No new complaint.  Objective:  Vital signs in last 24 hours:  Blood pressure 134/77, pulse 85, temperature 98 F (36.7 C), resp. rate 18, height 5' 5.5 (1.664 m), weight 159 lb 9.6 oz (72.4 kg), last menstrual period 11/19/1996, SpO2 100%.  Lymphatics: No cervical, supraclavicular, axillary, or inguinal nodes Resp: Lungs clear bilaterally Cardio: Regular rate and rhythm GI: No hepatosplenomegaly Vascular: No leg edema  Skin: Left chest wall biopsy site without evidence of recurrent tumor  Portacath/PICC-without erythema  Lab Results:  Lab Results  Component Value Date   WBC 5.0 09/10/2024   HGB 13.9 09/10/2024   HCT 42.5 09/10/2024   MCV 100.3 (H) 09/10/2024   PLT 248.0 09/10/2024   NEUTROABS 2.8 09/10/2024    CMP  Lab Results  Component Value Date   NA 139 12/02/2024   K 4.1 12/02/2024   CL 104 12/02/2024   CO2 28 12/02/2024   GLUCOSE 95 12/02/2024   BUN 17 12/02/2024   CREATININE 0.89 12/02/2024   CALCIUM  9.6 12/02/2024   PROT 7.2 09/10/2024   ALBUMIN 4.2 09/10/2024   AST 26 09/10/2024   ALT 19 09/10/2024   ALKPHOS 67 09/10/2024   BILITOT 0.4 09/10/2024   GFRNONAA >60 12/02/2024   GFRAA >60 08/16/2020    Lab Results  Component Value Date   CEA1 1.16 03/29/2021   CEA <1.00 12/02/2024     Medications: I have reviewed the patient's current medications.   Assessment/Plan: Adenocarcinoma the left colon, stage IIIc (pT4b,pN2a), status post a left colectomy 05/30/2020, MSS, TMB 13, BRAF V600E Tumor invades the visceral peritoneum, lymphovascular and perineural invasion present, for tumor deposits, 7/13 lymph nodes, negative resection margins, no loss of mismatch repair protein expression CT abdomen/pelvis 05/26/2020-long segment of masslike thickening of the  descending colon with associated high-grade colonic obstruction, small colonic wall defect with evidence of a focally contained microperforation, retroperitoneal adenopathy, indeterminate small hypodense liver lesions Elevated preoperative CEA PET 06/22/2020-hypermetabolic liver metastases, periportal and periaortic adenopathy, hypermetabolic mediastinal and left supraclavicular nodes Cycle 1 FOLFOX 07/05/20 Cycle 2 FOLFOXIRI (dose-reduced 5FU, no bolus) and Avastin  07/19/20  Cycle 3 FOLFOXIRI (Dose reduced 5-FU, no bolus)/Avastin  08/02/2020 Cycle 4 FOLFOXIRI/Avastin  08/16/2020 (Udenyca  added) Cycle 5 FOLFOXIRI/Avastin  08/30/2020 CTs 09/09/2020-diminished size of lymph nodes in the chest, abdomen, and pelvis.  Decreased hepatic metastases.  No new evidence of disease progression, fat density lesion surrounding the left hemicolectomy Cycle 6 FOLFOXIRI/Avastin  09/13/2020 Cycle 7 FOLFOX/Avastin  09/27/2020 (irinotecan  held) Cycle 8 FOLFOXIRI/Avastin  10/18/2020  Cycle 9 FOLFOXIRI/Avastin  11/01/2020 Cycle 10 FOLFOXIRI/Avastin  11/22/2020 (oxaliplatin  held, irinotecan  and 5-FU dose reduced) Cycle 11 FOLFOXIRI/Avastin  12/12/2020 (oxaliplatin  held) CTs 12/19/2020-decrease in size of liver metastases, no evidence of disease progression Cycle 12 FOLFOXIRI/Avastin  01/04/2021 (oxaliplatin  held) Cycle 13 FOLFOXIRI/Avastin  01/24/2021 (oxaliplatin  held) Cycle 14 FOLFOXIRI/Avastin  02/14/2021 (oxaliplatin  held) Cycle 15 FOLFOXIRI/Avastin  03/07/2021 (oxaliplatin  held) Cycle 16 FOLFOXIRI/Avastin  03/29/2021 (oxaliplatin  held) CTs 04/14/2021- decreased size of liver metastases.  No new or progressive findings. Cycle 17 FOLFOXIRI/Avastin  04/19/2021 (oxaliplatin  held) Cycle 18 FOLFOXIRI/Avastin  05/09/2021 (oxaliplatin  held) Cycle 19 FOLFOXIRI/Avastin  05/30/2021 (oxaliplatin  held) Cycle 20 FOLFOXIRI/Avastin  06/20/2021 (oxaliplatin  held) Cycle 21 FOLFOXIRI/Avastin  07/11/2021 (oxaliplatin  held) CTs 07/28/2021-unchanged subcentimeter liver  lesions, no evidence of new metastatic disease Cycle 22 5-FU/ Avastin  08/01/2021 Cycle 23 5-FU/Avastin  08/22/2021 Cycle 24 5-FU/Avastin  09/12/2021 Cycle 25 5-FU/Avastin  10/03/2021 Cycle 26 5-FU/Avastin  10/24/2021 Cycle 27  5-FU/Avastin  11/15/2021 Cycle 28 5-FU/Avastin  12/05/2021 CTs 12/22/2021-grossly similar subcentimeter low-attenuation lesions in the liver compatible with treated metastasis.  No evidence for new metastatic disease in the chest, abdomen or pelvis. Cycle 29 5-FU/Avastin  12/26/2021 Cycle 30 5-FU/Avastin  01/16/2022 Cycle 41 5-FU/Avastin  02/06/2022 Cycle 42 5-FU/Avastin  02/27/2022 Cycle 43 5-FU/Avastin  03/20/2022 Cycle 44 5-FU/Avastin  04/10/2022 CT 04/28/2022-interval development of a 3 mm nodule within the left lower lobe and 3 mm nodule within the right lower lobe, nonspecific.  Unchanged subcentimeter low-attenuation lesions in the liver compatible with treated metastasis. Cycle 45 5-FU/Avastin  05/01/2022 Cycle 46 5-FU/Avastin  05/29/2022 Cycle 47 5-FU/Avastin  06/19/2022 Cycle 48 5-FU/Avastin  07/10/2022 Cycle 49 5-FU/Avastin  07/31/2022 Cycle 50 5-FU/Avastin  08/21/2022 CTs 09/07/2022-no evidence of metastatic disease, tiny lung nodules described as new on previous exam have resolved, no hepatic metastases are appreciated Cycle 51 5-FU/Avastin  09/11/2022 Cycle 52 5-FU/Avastin  10/02/2022 Cycle 53 5-FU/Avastin  10/23/2022 Cycle 54 5-FU/Avastin  11/20/2021 Cycle 55 5-FU/Avastin  12/11/2022 Cycle 56 5-FU/Avastin  01/08/2023 Cycle 57 5-FU/Avastin  02/05/2023 CTs 02/28/2023-no evidence of recurrent or metastatic disease, stable left paracolic gutter interstitial thickening Cycle 58 5-FU/Avastin  03/05/2023 Cycle 59 5-FU/Avastin  04/02/2023 Cycle 60 5-FU/Avastin  04/30/2023 Cycle 61 5-FU/Avastin  05/28/2023 CT 06/18/2023-no evidence of metastatic disease Treatment break starting 06/25/2023 CTs 12/09/2023: No evidence of recurrent disease, stable minimal residual fascial thickening of the right paracolic gutter and mild  nodular fascial thickening at the left paracolic gutter, (left flank lesion visible and increased compared to a CT 06/18/2023 ( Biopsy of left flank skin lesion 12/04/2023: Metastatic adenocarcinoma, CDX2 and CK20 positive Excision of left flank skin lesion 01/06/2024-metastatic adenocarcinoma compatible with colorectal carcinoma, extensive lymphovascular invasion, negative resection margins CTs 06/04/2024: No evidence of recurrent or metastatic disease CTs 12/02/2024: No evidence of recurrent disease     Atrial fibrillation with rapid ventricular response 05/26/2020 Mild neutropenia following cycle 3 FOLFOXIRI, Udenyca  added with cycle 4 Hypokalemia secondary to diarrhea-potassium supplementation starting 09/13/2020 Oxaliplatin  neuropathy-moderate loss of vibratory sense on exam 11/22/2020, oxaliplatin  held with cycle 10, cycle 11, 12, 13 chemotherapy, improved      Disposition: Ms. Baccari is in clinical remission from colon cancer.  The plan is to continue observation.  A Port-A-Cath remains in place.  She will return for a Port-A-Cath flush in 7 weeks and an office visit in 15 weeks.  Arley Hof, MD  12/10/2024  4:01 PM   "

## 2024-12-15 ENCOUNTER — Encounter: Payer: Self-pay | Admitting: Oncology

## 2024-12-15 ENCOUNTER — Inpatient Hospital Stay: Admitting: Oncology

## 2025-01-28 ENCOUNTER — Inpatient Hospital Stay

## 2025-03-25 ENCOUNTER — Inpatient Hospital Stay

## 2025-03-25 ENCOUNTER — Inpatient Hospital Stay: Admitting: Oncology

## 2025-09-08 ENCOUNTER — Encounter: Admitting: Internal Medicine
